# Patient Record
Sex: Female | Born: 1960 | Race: Black or African American | Hispanic: No
Health system: Southern US, Community
[De-identification: ages and names within clinical notes are randomized; demographics above are authoritative.]

## PROBLEM LIST (undated history)

## (undated) DIAGNOSIS — I422 Other hypertrophic cardiomyopathy: Secondary | ICD-10-CM

## (undated) DIAGNOSIS — J449 Chronic obstructive pulmonary disease, unspecified: Secondary | ICD-10-CM

## (undated) DIAGNOSIS — I5032 Chronic diastolic (congestive) heart failure: Secondary | ICD-10-CM

## (undated) DIAGNOSIS — E119 Type 2 diabetes mellitus without complications: Secondary | ICD-10-CM

## (undated) DIAGNOSIS — N186 End stage renal disease: Secondary | ICD-10-CM

## (undated) DIAGNOSIS — G4733 Obstructive sleep apnea (adult) (pediatric): Secondary | ICD-10-CM

## (undated) DIAGNOSIS — Z992 Dependence on renal dialysis: Secondary | ICD-10-CM

## (undated) DIAGNOSIS — J9621 Acute and chronic respiratory failure with hypoxia: Secondary | ICD-10-CM

## (undated) DIAGNOSIS — J45909 Unspecified asthma, uncomplicated: Secondary | ICD-10-CM

## (undated) DIAGNOSIS — I10 Essential (primary) hypertension: Secondary | ICD-10-CM

## (undated) DIAGNOSIS — E11319 Type 2 diabetes mellitus with unspecified diabetic retinopathy without macular edema: Secondary | ICD-10-CM

## (undated) DIAGNOSIS — J4489 Other specified chronic obstructive pulmonary disease: Secondary | ICD-10-CM

## (undated) DIAGNOSIS — I509 Heart failure, unspecified: Secondary | ICD-10-CM

## (undated) DIAGNOSIS — Z95810 Presence of automatic (implantable) cardiac defibrillator: Secondary | ICD-10-CM

## (undated) HISTORY — PX: CHOLECYSTECTOMY: SHX55

## (undated) HISTORY — DX: Dependence on renal dialysis: Z99.2

## (undated) HISTORY — DX: Dependence on renal dialysis: N18.6

## (undated) HISTORY — DX: Chronic obstructive pulmonary disease, unspecified: J44.9

## (undated) HISTORY — DX: Acute and chronic respiratory failure with hypoxia: J96.21

## (undated) HISTORY — DX: Morbid (severe) obesity due to excess calories: E66.01

## (undated) HISTORY — DX: Obstructive sleep apnea (adult) (pediatric): G47.33

## (undated) HISTORY — DX: Other specified chronic obstructive pulmonary disease: J44.89

## (undated) HISTORY — DX: Chronic diastolic (congestive) heart failure: I50.32

## (undated) HISTORY — PX: CARDIAC DEFIBRILLATOR PLACEMENT: SHX171

---

## 2008-10-31 DIAGNOSIS — E119 Type 2 diabetes mellitus without complications: Secondary | ICD-10-CM | POA: Insufficient documentation

## 2011-01-20 DIAGNOSIS — I10 Essential (primary) hypertension: Secondary | ICD-10-CM | POA: Insufficient documentation

## 2012-11-29 DIAGNOSIS — G4700 Insomnia, unspecified: Secondary | ICD-10-CM | POA: Insufficient documentation

## 2014-01-30 DIAGNOSIS — F329 Major depressive disorder, single episode, unspecified: Secondary | ICD-10-CM | POA: Insufficient documentation

## 2014-01-30 DIAGNOSIS — F32A Depression, unspecified: Secondary | ICD-10-CM | POA: Insufficient documentation

## 2014-01-30 DIAGNOSIS — Z95810 Presence of automatic (implantable) cardiac defibrillator: Secondary | ICD-10-CM | POA: Insufficient documentation

## 2016-04-20 DIAGNOSIS — E8770 Fluid overload, unspecified: Secondary | ICD-10-CM | POA: Insufficient documentation

## 2016-08-24 DIAGNOSIS — Z0000 Encounter for general adult medical examination without abnormal findings: Secondary | ICD-10-CM | POA: Insufficient documentation

## 2016-09-04 ENCOUNTER — Encounter: Payer: Self-pay | Admitting: Emergency Medicine

## 2016-09-04 ENCOUNTER — Inpatient Hospital Stay
Admission: EM | Admit: 2016-09-04 | Discharge: 2016-09-11 | DRG: 202 | Disposition: A | Discharge status: Home / Self Care | Payer: BC Managed Care – PPO | Attending: Internal Medicine | Admitting: Internal Medicine

## 2016-09-04 ENCOUNTER — Emergency Department: Payer: BC Managed Care – PPO

## 2016-09-04 DIAGNOSIS — J45901 Unspecified asthma with (acute) exacerbation: Secondary | ICD-10-CM | POA: Diagnosis present

## 2016-09-04 DIAGNOSIS — I422 Other hypertrophic cardiomyopathy: Secondary | ICD-10-CM

## 2016-09-04 DIAGNOSIS — I248 Other forms of acute ischemic heart disease: Secondary | ICD-10-CM | POA: Diagnosis present

## 2016-09-04 DIAGNOSIS — I2489 Other forms of acute ischemic heart disease: Secondary | ICD-10-CM

## 2016-09-04 DIAGNOSIS — I214 Non-ST elevation (NSTEMI) myocardial infarction: Secondary | ICD-10-CM | POA: Diagnosis not present

## 2016-09-04 DIAGNOSIS — J40 Bronchitis, not specified as acute or chronic: Secondary | ICD-10-CM

## 2016-09-04 DIAGNOSIS — Z6839 Body mass index (BMI) 39.0-39.9, adult: Secondary | ICD-10-CM

## 2016-09-04 DIAGNOSIS — J209 Acute bronchitis, unspecified: Principal | ICD-10-CM | POA: Diagnosis present

## 2016-09-04 DIAGNOSIS — Z79899 Other long term (current) drug therapy: Secondary | ICD-10-CM

## 2016-09-04 DIAGNOSIS — R059 Cough, unspecified: Secondary | ICD-10-CM

## 2016-09-04 DIAGNOSIS — I509 Heart failure, unspecified: Secondary | ICD-10-CM | POA: Diagnosis present

## 2016-09-04 DIAGNOSIS — Z7951 Long term (current) use of inhaled steroids: Secondary | ICD-10-CM

## 2016-09-04 DIAGNOSIS — J45909 Unspecified asthma, uncomplicated: Secondary | ICD-10-CM | POA: Diagnosis present

## 2016-09-04 DIAGNOSIS — E669 Obesity, unspecified: Secondary | ICD-10-CM | POA: Diagnosis present

## 2016-09-04 DIAGNOSIS — R05 Cough: Secondary | ICD-10-CM

## 2016-09-04 DIAGNOSIS — E119 Type 2 diabetes mellitus without complications: Secondary | ICD-10-CM | POA: Diagnosis present

## 2016-09-04 DIAGNOSIS — Z95810 Presence of automatic (implantable) cardiac defibrillator: Secondary | ICD-10-CM

## 2016-09-04 DIAGNOSIS — R0600 Dyspnea, unspecified: Secondary | ICD-10-CM

## 2016-09-04 DIAGNOSIS — I421 Obstructive hypertrophic cardiomyopathy: Secondary | ICD-10-CM

## 2016-09-04 DIAGNOSIS — Z23 Encounter for immunization: Secondary | ICD-10-CM

## 2016-09-04 DIAGNOSIS — F329 Major depressive disorder, single episode, unspecified: Secondary | ICD-10-CM | POA: Diagnosis present

## 2016-09-04 DIAGNOSIS — G4733 Obstructive sleep apnea (adult) (pediatric): Secondary | ICD-10-CM | POA: Diagnosis present

## 2016-09-04 DIAGNOSIS — Z7982 Long term (current) use of aspirin: Secondary | ICD-10-CM

## 2016-09-04 DIAGNOSIS — Z794 Long term (current) use of insulin: Secondary | ICD-10-CM

## 2016-09-04 DIAGNOSIS — I110 Hypertensive heart disease with heart failure: Secondary | ICD-10-CM | POA: Diagnosis present

## 2016-09-04 HISTORY — DX: Heart failure, unspecified: I50.9

## 2016-09-04 HISTORY — DX: Essential (primary) hypertension: I10

## 2016-09-04 HISTORY — DX: Type 2 diabetes mellitus without complications: E11.9

## 2016-09-04 LAB — CBC WITH DIFFERENTIAL/PLATELET
Basophils Absolute: 0 10*3/uL (ref 0–0.1)
Basophils Relative: 1 %
EOS PCT: 3 %
Eosinophils Absolute: 0.1 10*3/uL (ref 0–0.7)
HEMATOCRIT: 37 % (ref 35.0–47.0)
Hemoglobin: 12.3 g/dL (ref 12.0–16.0)
LYMPHS ABS: 0.9 10*3/uL — AB (ref 1.0–3.6)
LYMPHS PCT: 16 %
MCH: 26.5 pg (ref 26.0–34.0)
MCHC: 33.2 g/dL (ref 32.0–36.0)
MCV: 79.7 fL — AB (ref 80.0–100.0)
MONO ABS: 1.1 10*3/uL — AB (ref 0.2–0.9)
Monocytes Relative: 21 %
NEUTROS ABS: 3.3 10*3/uL (ref 1.4–6.5)
Neutrophils Relative %: 61 %
PLATELETS: 216 10*3/uL (ref 150–440)
RBC: 4.64 MIL/uL (ref 3.80–5.20)
RDW: 17.2 % — AB (ref 11.5–14.5)
WBC: 5.5 10*3/uL (ref 3.6–11.0)

## 2016-09-04 LAB — COMPREHENSIVE METABOLIC PANEL
ALT: 18 U/L (ref 14–54)
AST: 23 U/L (ref 15–41)
Albumin: 3.8 g/dL (ref 3.5–5.0)
Alkaline Phosphatase: 70 U/L (ref 38–126)
Anion gap: 11 (ref 5–15)
BILIRUBIN TOTAL: 0.4 mg/dL (ref 0.3–1.2)
BUN: 16 mg/dL (ref 6–20)
CHLORIDE: 106 mmol/L (ref 101–111)
CO2: 23 mmol/L (ref 22–32)
Calcium: 9.1 mg/dL (ref 8.9–10.3)
Creatinine, Ser: 0.94 mg/dL (ref 0.44–1.00)
Glucose, Bld: 170 mg/dL — ABNORMAL HIGH (ref 65–99)
Potassium: 4.3 mmol/L (ref 3.5–5.1)
Sodium: 140 mmol/L (ref 135–145)
TOTAL PROTEIN: 7.5 g/dL (ref 6.5–8.1)

## 2016-09-04 LAB — INFLUENZA PANEL BY PCR (TYPE A & B)
Influenza A By PCR: NEGATIVE
Influenza B By PCR: NEGATIVE

## 2016-09-04 LAB — APTT: APTT: 31 s (ref 24–36)

## 2016-09-04 LAB — PROTIME-INR
INR: 1
PROTHROMBIN TIME: 13.2 s (ref 11.4–15.2)

## 2016-09-04 LAB — TROPONIN I: Troponin I: 0.21 ng/mL (ref <0.03)

## 2016-09-04 MED ORDER — ASPIRIN 81 MG PO CHEW
324.0000 mg | CHEWABLE_TABLET | Freq: Once | ORAL | Status: AC
  Administered 2016-09-04: 324 mg via ORAL
  Filled 2016-09-04: qty 4

## 2016-09-04 MED ORDER — DM-GUAIFENESIN ER 30-600 MG PO TB12: 1.0000 | ORAL_TABLET | Freq: Two times a day (BID) | ORAL | Status: DC

## 2016-09-04 MED ORDER — IPRATROPIUM-ALBUTEROL 0.5-2.5 (3) MG/3ML IN SOLN
RESPIRATORY_TRACT | Status: AC
  Administered 2016-09-04: 3 mL via RESPIRATORY_TRACT
  Filled 2016-09-04: qty 3

## 2016-09-04 MED ORDER — GUAIFENESIN ER 600 MG PO TB12
600.0000 mg | ORAL_TABLET | Freq: Two times a day (BID) | ORAL | Status: DC
  Administered 2016-09-04: 600 mg via ORAL
  Filled 2016-09-04 (×2): qty 1

## 2016-09-04 MED ORDER — IPRATROPIUM-ALBUTEROL 0.5-2.5 (3) MG/3ML IN SOLN
3.0000 mL | Freq: Four times a day (QID) | RESPIRATORY_TRACT | Status: DC | PRN
  Administered 2016-09-04: 3 mL via RESPIRATORY_TRACT

## 2016-09-04 MED ORDER — DEXTROMETHORPHAN POLISTIREX ER 30 MG/5ML PO SUER
30.0000 mg | Freq: Two times a day (BID) | ORAL | Status: DC
  Administered 2016-09-04: 30 mg via ORAL
  Filled 2016-09-04 (×2): qty 5

## 2016-09-04 NOTE — ED Provider Notes (Signed)
[facility] Emergency Department Provider Note   ____________________________________________    I have reviewed the triage vital signs and the nursing notes.   HISTORY  Chief Complaint Cough and Shortness of Breath     HPI Julie Bender is a 56 y.o. female who presents with complaints of cough, shortness of breath and mild chest pressure. She reports this is been occurring for the last 2 days. Patient reports she was recently discharged from [facility], she was admitted there for fluid overload. Patient has a history of HOCM and diabetes, her cardiologist is at [facility]. She denies calf pain or swelling.   Past Medical History:  Diagnosis Date  . CHF (congestive heart failure) ([place])   . Diabetes mellitus without complication ([place])   . Hypertension     There are no active problems to display for this patient.   No past surgical history on file.  Prior to Admission medications   Not on File     Allergies Patient has no known allergies.  No family history on file.  Social History Social History  Substance Use Topics  . Smoking status: Passive Smoke Exposure - Never Smoker  . Smokeless tobacco: Never Used  . Alcohol use No    Review of Systems  Constitutional: No fever/chills  Cardiovascular: Chest pressure as above Respiratory: As above Gastrointestinal: No abdominal pain.   Musculoskeletal: Negative for back pain. Skin: Negative for rash. Neurological: Negative for headaches   10-point ROS otherwise negative.  ____________________________________________   PHYSICAL EXAM:  VITAL SIGNS: ED Triage Vitals  Enc Vitals Group     BP 09/04/16 2016 125/68     Pulse Rate 09/04/16 2016 63     Resp 09/04/16 2026 (!) 22     Temp 09/04/16 2016 98.5 F (36.9 C)     Temp Source 09/04/16 2016 Oral     SpO2 09/04/16 2016 96 %     Weight --      Height --      Head Circumference --      Peak Flow --      Pain Score 09/04/16 2019 9   Pain Loc --      Pain Edu? --      Excl. in [place]? --     Constitutional: Alert and oriented.  Pleasant and interactive Eyes: Conjunctivae are normal.   Nose: No congestion/rhinnorhea. Mouth/Throat: Mucous membranes are moist.   Neck:  Painless ROM Cardiovascular: Normal rate, regular rhythm. Grossly normal heart sounds.  Good peripheral circulation. Respiratory: Increased respiratory effort,  No retractions. Bibasilar rales Gastrointestinal: Soft and nontender. No distention.  No CVA tenderness. Genitourinary: deferred Musculoskeletal: No lower extremity tenderness nor edema.  Warm and well perfused Neurologic:  Normal speech and language. No gross focal neurologic deficits are appreciated.  Skin:  Skin is warm, dry and intact. No rash noted. Psychiatric: Mood and affect are normal. Speech and behavior are normal.  ____________________________________________   LABS (all labs ordered are listed, but only abnormal results are displayed)  Labs Reviewed  TROPONIN I - Abnormal; Notable for the following:       Result Value   Troponin I 0.21 (*)    All other components within normal limits  CBC WITH DIFFERENTIAL/PLATELET - Abnormal; Notable for the following:    MCV 79.7 (*)    RDW 17.2 (*)    Lymphs Abs 0.9 (*)    Monocytes Absolute 1.1 (*)    All other components within normal limits  COMPREHENSIVE METABOLIC PANEL - Abnormal; Notable for the following:    Glucose, Bld 170 (*)    All other components within normal limits  INFLUENZA PANEL BY PCR (TYPE A & B)  APTT  PROTIME-INR   ____________________________________________  EKG  ED ECG REPORT I, Lavonia Drafts, the attending physician, personally viewed and interpreted this ECG.  Date: 09/04/2016 EKG Time: 8:35 PM Rate: 63 Rhythm: normal sinus rhythm QRS Axis: normal Intervals: normal ST/T Wave abnormalities: Nonspecific Conduction Disturbances: Incomplete right  bundle   ____________________________________________  RADIOLOGY  Chest x-ray is remarkable ____________________________________________   PROCEDURES  Procedure(s) performed: No    Critical Care performed: yes  CRITICAL CARE Performed by: Lavonia Drafts   Total critical care time: 30 minutes  Critical care time was exclusive of separately billable procedures and treating other patients.  Critical care was necessary to treat or prevent imminent or life-threatening deterioration.  Critical care was time spent personally by me on the following activities: development of treatment plan with patient and/or surrogate as well as nursing, discussions with consultants, evaluation of patient's response to treatment, examination of patient, obtaining history from patient or surrogate, ordering and performing treatments and interventions, ordering and review of laboratory studies, ordering and review of radiographic studies, pulse oximetry and re-evaluation of patient's condition.  ____________________________________________   INITIAL IMPRESSION / ASSESSMENT AND PLAN / ED COURSE  Pertinent labs & imaging results that were available during my care of the patient were reviewed by me and considered in my medical decision making (see chart for details).  Patient presents with complaints of cough, short of breath and chest pressure. She feels she may have an upper respiratory infection. She has a history of HOCM with a defibrillator and diabetes. EKG with nonspecific changes.   Patient's troponin has come back elevated. Discussed with cardiology who agrees with heparin and admission for further evaluation.    ____________________________________________   FINAL CLINICAL IMPRESSION(S) / ED DIAGNOSES  Final diagnoses:  NSTEMI (non-ST elevated myocardial infarction) ([place])      NEW MEDICATIONS STARTED DURING THIS VISIT:  New Prescriptions   No medications on file     Note:   This document was prepared using Dragon voice recognition software and may include unintentional dictation errors.    Lavonia Drafts, MD 09/04/16 2252

## 2016-09-04 NOTE — ED Triage Notes (Signed)
Pt states she has been short of breath and has had a cough. Pt states she began to have chills with sore throat, cough on Thursday and has progressively gotten worse today. Pt states she also has diarrhea.

## 2016-09-04 NOTE — ED Notes (Signed)
Breath sounds clear in triage.

## 2016-09-05 DIAGNOSIS — I110 Hypertensive heart disease with heart failure: Secondary | ICD-10-CM | POA: Diagnosis present

## 2016-09-05 DIAGNOSIS — F329 Major depressive disorder, single episode, unspecified: Secondary | ICD-10-CM | POA: Diagnosis present

## 2016-09-05 DIAGNOSIS — I214 Non-ST elevation (NSTEMI) myocardial infarction: Secondary | ICD-10-CM | POA: Diagnosis present

## 2016-09-05 DIAGNOSIS — I509 Heart failure, unspecified: Secondary | ICD-10-CM | POA: Diagnosis present

## 2016-09-05 DIAGNOSIS — G4733 Obstructive sleep apnea (adult) (pediatric): Secondary | ICD-10-CM | POA: Diagnosis present

## 2016-09-05 DIAGNOSIS — J40 Bronchitis, not specified as acute or chronic: Secondary | ICD-10-CM | POA: Diagnosis not present

## 2016-09-05 DIAGNOSIS — I421 Obstructive hypertrophic cardiomyopathy: Secondary | ICD-10-CM | POA: Diagnosis present

## 2016-09-05 DIAGNOSIS — J45901 Unspecified asthma with (acute) exacerbation: Secondary | ICD-10-CM | POA: Diagnosis present

## 2016-09-05 DIAGNOSIS — R0602 Shortness of breath: Secondary | ICD-10-CM

## 2016-09-05 DIAGNOSIS — Z794 Long term (current) use of insulin: Secondary | ICD-10-CM | POA: Diagnosis not present

## 2016-09-05 DIAGNOSIS — Z7982 Long term (current) use of aspirin: Secondary | ICD-10-CM | POA: Diagnosis not present

## 2016-09-05 DIAGNOSIS — E669 Obesity, unspecified: Secondary | ICD-10-CM | POA: Diagnosis present

## 2016-09-05 DIAGNOSIS — Z6839 Body mass index (BMI) 39.0-39.9, adult: Secondary | ICD-10-CM | POA: Diagnosis not present

## 2016-09-05 DIAGNOSIS — J45909 Unspecified asthma, uncomplicated: Secondary | ICD-10-CM | POA: Diagnosis present

## 2016-09-05 DIAGNOSIS — Z23 Encounter for immunization: Secondary | ICD-10-CM | POA: Diagnosis not present

## 2016-09-05 DIAGNOSIS — J9801 Acute bronchospasm: Secondary | ICD-10-CM | POA: Diagnosis not present

## 2016-09-05 DIAGNOSIS — Z79899 Other long term (current) drug therapy: Secondary | ICD-10-CM | POA: Diagnosis not present

## 2016-09-05 DIAGNOSIS — Z95810 Presence of automatic (implantable) cardiac defibrillator: Secondary | ICD-10-CM | POA: Diagnosis not present

## 2016-09-05 DIAGNOSIS — I248 Other forms of acute ischemic heart disease: Secondary | ICD-10-CM | POA: Diagnosis present

## 2016-09-05 DIAGNOSIS — R079 Chest pain, unspecified: Secondary | ICD-10-CM | POA: Diagnosis not present

## 2016-09-05 DIAGNOSIS — J209 Acute bronchitis, unspecified: Secondary | ICD-10-CM | POA: Diagnosis present

## 2016-09-05 DIAGNOSIS — Z7951 Long term (current) use of inhaled steroids: Secondary | ICD-10-CM | POA: Diagnosis not present

## 2016-09-05 DIAGNOSIS — E119 Type 2 diabetes mellitus without complications: Secondary | ICD-10-CM | POA: Diagnosis present

## 2016-09-05 DIAGNOSIS — R05 Cough: Secondary | ICD-10-CM | POA: Diagnosis not present

## 2016-09-05 LAB — BRAIN NATRIURETIC PEPTIDE: B Natriuretic Peptide: 559 pg/mL — ABNORMAL HIGH (ref 0.0–100.0)

## 2016-09-05 LAB — PROTIME-INR
INR: 1.1
Prothrombin Time: 14.2 seconds (ref 11.4–15.2)

## 2016-09-05 LAB — LIPID PANEL
Cholesterol: 90 mg/dL (ref 0–200)
HDL: 30 mg/dL — ABNORMAL LOW (ref >40)
LDL Cholesterol: 48 mg/dL (ref 0–99)
Total CHOL/HDL Ratio: 3 RATIO
Triglycerides: 60 mg/dL (ref <150)
VLDL: 12 mg/dL (ref 0–40)

## 2016-09-05 LAB — HEPARIN LEVEL (UNFRACTIONATED): HEPARIN UNFRACTIONATED: 0.45 [IU]/mL (ref 0.30–0.70)

## 2016-09-05 LAB — BASIC METABOLIC PANEL
Anion gap: 9 (ref 5–15)
BUN: 15 mg/dL (ref 6–20)
CHLORIDE: 106 mmol/L (ref 101–111)
CO2: 25 mmol/L (ref 22–32)
CREATININE: 0.88 mg/dL (ref 0.44–1.00)
Calcium: 8.8 mg/dL — ABNORMAL LOW (ref 8.9–10.3)
GFR calc Af Amer: >60 mL/min (ref >60)
GFR calc non Af Amer: >60 mL/min (ref >60)
GLUCOSE: 122 mg/dL — AB (ref 65–99)
Potassium: 3.8 mmol/L (ref 3.5–5.1)
SODIUM: 140 mmol/L (ref 135–145)

## 2016-09-05 LAB — CBC
HCT: 35.9 % (ref 35.0–47.0)
Hemoglobin: 12 g/dL (ref 12.0–16.0)
MCH: 26.7 pg (ref 26.0–34.0)
MCHC: 33.5 g/dL (ref 32.0–36.0)
MCV: 79.6 fL — AB (ref 80.0–100.0)
PLATELETS: 229 10*3/uL (ref 150–440)
RBC: 4.51 MIL/uL (ref 3.80–5.20)
RDW: 17.3 % — AB (ref 11.5–14.5)
WBC: 5.9 10*3/uL (ref 3.6–11.0)

## 2016-09-05 LAB — TROPONIN I
TROPONIN I: 0.21 ng/mL — AB (ref <0.03)
TROPONIN I: 0.2 ng/mL — AB (ref <0.03)
Troponin I: 0.2 ng/mL (ref <0.03)

## 2016-09-05 LAB — GLUCOSE, CAPILLARY
GLUCOSE-CAPILLARY: 186 mg/dL — AB (ref 65–99)
Glucose-Capillary: 320 mg/dL — ABNORMAL HIGH (ref 65–99)
Glucose-Capillary: 461 mg/dL — ABNORMAL HIGH (ref 65–99)
Glucose-Capillary: 498 mg/dL — ABNORMAL HIGH (ref 65–99)

## 2016-09-05 LAB — TSH: TSH: 1.152 u[IU]/mL (ref 0.350–4.500)

## 2016-09-05 MED ORDER — INSULIN ASPART 100 UNIT/ML ~~LOC~~ SOLN
22.0000 [IU] | Freq: Once | SUBCUTANEOUS | Status: AC
  Administered 2016-09-05: 22 [IU] via SUBCUTANEOUS
  Filled 2016-09-05: qty 22

## 2016-09-05 MED ORDER — ALPRAZOLAM 0.25 MG PO TABS: 0.2500 mg | ORAL_TABLET | Freq: Two times a day (BID) | ORAL | Status: DC | PRN

## 2016-09-05 MED ORDER — INSULIN ASPART 100 UNIT/ML ~~LOC~~ SOLN
0.0000 [IU] | Freq: Three times a day (TID) | SUBCUTANEOUS | Status: DC
  Administered 2016-09-05: 15 [IU] via SUBCUTANEOUS
  Administered 2016-09-05: 4 [IU] via SUBCUTANEOUS
  Administered 2016-09-06: 11 [IU] via SUBCUTANEOUS
  Administered 2016-09-06: 7 [IU] via SUBCUTANEOUS
  Administered 2016-09-06: 15 [IU] via SUBCUTANEOUS
  Administered 2016-09-07: 11 [IU] via SUBCUTANEOUS
  Administered 2016-09-07: 20 [IU] via SUBCUTANEOUS
  Administered 2016-09-07 – 2016-09-08 (×2): 15 [IU] via SUBCUTANEOUS
  Administered 2016-09-08: 20 [IU] via SUBCUTANEOUS
  Administered 2016-09-08: 11 [IU] via SUBCUTANEOUS
  Administered 2016-09-09: 20 [IU] via SUBCUTANEOUS
  Administered 2016-09-09: 4 [IU] via SUBCUTANEOUS
  Administered 2016-09-09: 20 [IU] via SUBCUTANEOUS
  Administered 2016-09-10: 11 [IU] via SUBCUTANEOUS
  Administered 2016-09-10: 7 [IU] via SUBCUTANEOUS
  Administered 2016-09-10: 11 [IU] via SUBCUTANEOUS
  Administered 2016-09-11: 3 [IU] via SUBCUTANEOUS
  Filled 2016-09-05: qty 7
  Filled 2016-09-05: qty 11
  Filled 2016-09-05 (×2): qty 15
  Filled 2016-09-05: qty 4
  Filled 2016-09-05: qty 15
  Filled 2016-09-05: qty 11
  Filled 2016-09-05: qty 3
  Filled 2016-09-05: qty 7
  Filled 2016-09-05: qty 15
  Filled 2016-09-05 (×2): qty 20
  Filled 2016-09-05: qty 4
  Filled 2016-09-05: qty 20
  Filled 2016-09-05 (×2): qty 11
  Filled 2016-09-05: qty 20
  Filled 2016-09-05: qty 11

## 2016-09-05 MED ORDER — NITROGLYCERIN 0.4 MG SL SUBL: 0.4000 mg | SUBLINGUAL_TABLET | SUBLINGUAL | Status: DC | PRN

## 2016-09-05 MED ORDER — FUROSEMIDE 10 MG/ML IJ SOLN
40.0000 mg | Freq: Once | INTRAMUSCULAR | Status: AC
Start: 2016-09-05 — End: 2016-09-05
  Administered 2016-09-05: 40 mg via INTRAVENOUS
  Filled 2016-09-05: qty 4

## 2016-09-05 MED ORDER — DEXTROSE 5 % IV SOLN: 250.0000 mg | INTRAVENOUS | Status: DC

## 2016-09-05 MED ORDER — BUMETANIDE 1 MG PO TABS
1.0000 mg | ORAL_TABLET | Freq: Two times a day (BID) | ORAL | Status: DC
  Administered 2016-09-05 – 2016-09-11 (×13): 1 mg via ORAL
  Filled 2016-09-05 (×14): qty 1

## 2016-09-05 MED ORDER — IPRATROPIUM-ALBUTEROL 0.5-2.5 (3) MG/3ML IN SOLN
3.0000 mL | Freq: Four times a day (QID) | RESPIRATORY_TRACT | Status: DC | PRN
  Administered 2016-09-05: 3 mL via RESPIRATORY_TRACT
  Filled 2016-09-05: qty 3

## 2016-09-05 MED ORDER — ZOLPIDEM TARTRATE 5 MG PO TABS: 5.0000 mg | ORAL_TABLET | Freq: Every evening | ORAL | Status: DC | PRN

## 2016-09-05 MED ORDER — LOSARTAN POTASSIUM 50 MG PO TABS
100.0000 mg | ORAL_TABLET | Freq: Every day | ORAL | Status: DC
  Administered 2016-09-05 – 2016-09-11 (×7): 100 mg via ORAL
  Filled 2016-09-05 (×7): qty 2

## 2016-09-05 MED ORDER — INSULIN GLARGINE 100 UNIT/ML ~~LOC~~ SOLN
40.0000 [IU] | Freq: Every day | SUBCUTANEOUS | Status: DC
  Administered 2016-09-05 – 2016-09-07 (×3): 40 [IU] via SUBCUTANEOUS
  Filled 2016-09-05 (×4): qty 0.4

## 2016-09-05 MED ORDER — ATORVASTATIN CALCIUM 20 MG PO TABS
20.0000 mg | ORAL_TABLET | Freq: Every day | ORAL | Status: DC
  Administered 2016-09-05 – 2016-09-11 (×7): 20 mg via ORAL
  Filled 2016-09-05 (×7): qty 1

## 2016-09-05 MED ORDER — SODIUM CHLORIDE 0.9% FLUSH: 3.0000 mL | INTRAVENOUS | Status: DC | PRN

## 2016-09-05 MED ORDER — BENZONATATE 100 MG PO CAPS
100.0000 mg | ORAL_CAPSULE | Freq: Three times a day (TID) | ORAL | Status: DC | PRN
  Administered 2016-09-06 – 2016-09-10 (×3): 100 mg via ORAL
  Filled 2016-09-05 (×3): qty 1

## 2016-09-05 MED ORDER — PNEUMOCOCCAL 13-VAL CONJ VACC IM SUSP
0.5000 mL | INTRAMUSCULAR | Status: DC
  Filled 2016-09-05: qty 0.5

## 2016-09-05 MED ORDER — HYDROCOD POLST-CPM POLST ER 10-8 MG/5ML PO SUER
5.0000 mL | Freq: Two times a day (BID) | ORAL | Status: DC
  Administered 2016-09-05 – 2016-09-11 (×12): 5 mL via ORAL
  Filled 2016-09-05 (×13): qty 5

## 2016-09-05 MED ORDER — BENZONATATE 100 MG PO CAPS
100.0000 mg | ORAL_CAPSULE | Freq: Three times a day (TID) | ORAL | Status: DC | PRN
  Administered 2016-09-05 (×2): 100 mg via ORAL
  Filled 2016-09-05: qty 1

## 2016-09-05 MED ORDER — BENZONATATE 100 MG PO CAPS
ORAL_CAPSULE | ORAL | Status: AC
  Administered 2016-09-05: 100 mg via ORAL
  Filled 2016-09-05: qty 1

## 2016-09-05 MED ORDER — METHYLPREDNISOLONE SODIUM SUCC 125 MG IJ SOLR
60.0000 mg | Freq: Four times a day (QID) | INTRAMUSCULAR | Status: DC
Start: 2016-09-05 — End: 2016-09-07
  Administered 2016-09-05 (×3): 60 mg via INTRAVENOUS
  Administered 2016-09-05: 125 mg via INTRAVENOUS
  Administered 2016-09-06 – 2016-09-07 (×6): 60 mg via INTRAVENOUS
  Filled 2016-09-05 (×11): qty 2

## 2016-09-05 MED ORDER — HEPARIN BOLUS VIA INFUSION
4000.0000 [IU] | Freq: Once | INTRAVENOUS | Status: AC
  Administered 2016-09-05: 4000 [IU] via INTRAVENOUS
  Filled 2016-09-05: qty 4000

## 2016-09-05 MED ORDER — SODIUM CHLORIDE 0.9 % IV SOLN: 250.0000 mL | INTRAVENOUS | Status: DC | PRN

## 2016-09-05 MED ORDER — TRAZODONE HCL 50 MG PO TABS
150.0000 mg | ORAL_TABLET | Freq: Every day | ORAL | Status: DC
  Administered 2016-09-05 – 2016-09-10 (×6): 150 mg via ORAL
  Filled 2016-09-05 (×6): qty 1

## 2016-09-05 MED ORDER — INSULIN ASPART 100 UNIT/ML ~~LOC~~ SOLN
6.0000 [IU] | Freq: Once | SUBCUTANEOUS | Status: AC
  Administered 2016-09-05: 6 [IU] via SUBCUTANEOUS
  Filled 2016-09-05: qty 6

## 2016-09-05 MED ORDER — HEPARIN (PORCINE) IN NACL 100-0.45 UNIT/ML-% IJ SOLN
950.0000 [IU]/h | INTRAMUSCULAR | Status: DC
  Administered 2016-09-05: 950 [IU]/h via INTRAVENOUS
  Filled 2016-09-05: qty 250

## 2016-09-05 MED ORDER — ACETAMINOPHEN 325 MG PO TABS
650.0000 mg | ORAL_TABLET | ORAL | Status: DC | PRN
  Administered 2016-09-07 – 2016-09-10 (×3): 650 mg via ORAL
  Filled 2016-09-05 (×3): qty 2

## 2016-09-05 MED ORDER — IPRATROPIUM-ALBUTEROL 0.5-2.5 (3) MG/3ML IN SOLN
3.0000 mL | RESPIRATORY_TRACT | Status: DC
  Administered 2016-09-05 – 2016-09-09 (×25): 3 mL via RESPIRATORY_TRACT
  Filled 2016-09-05 (×25): qty 3

## 2016-09-05 MED ORDER — VERAPAMIL HCL ER 240 MG PO TBCR
240.0000 mg | EXTENDED_RELEASE_TABLET | Freq: Two times a day (BID) | ORAL | Status: DC
  Administered 2016-09-05 – 2016-09-11 (×13): 240 mg via ORAL
  Filled 2016-09-05 (×13): qty 1

## 2016-09-05 MED ORDER — INSULIN ASPART 100 UNIT/ML ~~LOC~~ SOLN
0.0000 [IU] | Freq: Every day | SUBCUTANEOUS | Status: DC
  Administered 2016-09-06: 4 [IU] via SUBCUTANEOUS
  Administered 2016-09-07 – 2016-09-09 (×3): 5 [IU] via SUBCUTANEOUS
  Filled 2016-09-05: qty 5
  Filled 2016-09-05: qty 4
  Filled 2016-09-05: qty 3
  Filled 2016-09-05: qty 5

## 2016-09-05 MED ORDER — ASPIRIN EC 81 MG PO TBEC
81.0000 mg | DELAYED_RELEASE_TABLET | Freq: Every day | ORAL | Status: DC
Start: 2016-09-05 — End: 2016-09-11
  Administered 2016-09-05 – 2016-09-11 (×7): 81 mg via ORAL
  Filled 2016-09-05 (×7): qty 1

## 2016-09-05 MED ORDER — GUAIFENESIN-CODEINE 100-10 MG/5ML PO SOLN
5.0000 mL | Freq: Four times a day (QID) | ORAL | Status: DC | PRN
  Administered 2016-09-05 – 2016-09-10 (×6): 5 mL via ORAL
  Filled 2016-09-05 (×6): qty 5

## 2016-09-05 MED ORDER — ONDANSETRON HCL 4 MG/2ML IJ SOLN: 4.0000 mg | Freq: Four times a day (QID) | INTRAMUSCULAR | Status: DC | PRN

## 2016-09-05 MED ORDER — DEXTROSE 5 % IV SOLN
250.0000 mg | Freq: Two times a day (BID) | INTRAVENOUS | Status: DC
  Administered 2016-09-05 – 2016-09-06 (×2): 250 mg via INTRAVENOUS
  Filled 2016-09-05 (×3): qty 0.25

## 2016-09-05 MED ORDER — CITALOPRAM HYDROBROMIDE 20 MG PO TABS
20.0000 mg | ORAL_TABLET | Freq: Every day | ORAL | Status: DC
  Administered 2016-09-05 – 2016-09-11 (×7): 20 mg via ORAL
  Filled 2016-09-05 (×7): qty 1

## 2016-09-05 MED ORDER — CARVEDILOL 25 MG PO TABS
25.0000 mg | ORAL_TABLET | Freq: Two times a day (BID) | ORAL | Status: DC
  Administered 2016-09-05 – 2016-09-09 (×10): 25 mg via ORAL
  Filled 2016-09-05 (×10): qty 1

## 2016-09-05 MED ORDER — SODIUM CHLORIDE 0.9% FLUSH
3.0000 mL | Freq: Two times a day (BID) | INTRAVENOUS | Status: DC
  Administered 2016-09-05 – 2016-09-10 (×13): 3 mL via INTRAVENOUS

## 2016-09-05 MED ORDER — BUDESONIDE 0.25 MG/2ML IN SUSP
0.2500 mg | Freq: Two times a day (BID) | RESPIRATORY_TRACT | Status: DC
  Administered 2016-09-05 – 2016-09-07 (×5): 0.25 mg via RESPIRATORY_TRACT
  Filled 2016-09-05 (×5): qty 2

## 2016-09-05 MED ORDER — OXYCODONE-ACETAMINOPHEN 5-325 MG PO TABS
1.0000 | ORAL_TABLET | Freq: Four times a day (QID) | ORAL | Status: DC | PRN
  Administered 2016-09-05 – 2016-09-11 (×17): 1 via ORAL
  Filled 2016-09-05 (×17): qty 1

## 2016-09-05 NOTE — H&P (Addendum)
History and Physical   [facility] - [place] @ [facility] Admission History and Physical [employer], D.O.    Patient Name: Julie Bender MR#: [id number] Date of Birth: [birth year] Date of Admission: 09/04/2016  Referring MD/NP/PA: Dr. Corky Downs Primary Care Physician: [place] [facility] Special[facility]: [facility]  Patient coming from: Home  Chief Complaint: Cough  HPI: Julie Bender is a 56 y.o. female with a known history of CHF, DM, HTN, hypertrophic obstructive cardiomyopathy  With defibrillator, depressionwas in a usual state of health until about  2 days ago when she developed  Nonproductive cough, generalized malaise, fatigue, achiness and associated central chest pressure accompanying her cough. Patient states that she was recently admitted to [facility] for congestive heart failure exacerbation. She has attempted to treat her cough and congestion with over-the-counter expectorants which did not provide much relief. She states that her chest pain is central, localized, associated with cough. She does experience shortness of breath with coughing fits but denies diaphoresis or nausea. She denies fevers, chills, dizziness, nausea, vomiting, abdominal pain  Otherwise there has been no change in status. Patient has been taking medication as prescribed and there has been no recent change in medication or diet.  No recent antibiotics.  There has been no recent illness, travel or sick contacts.    ED Course:  In the emergency department patient was found to have elevated troponin 0.21 was started on heparin drip after ED physician consulted with cardiology on-call.  Review of Systems:  CONSTITUTIONAL: No fever/chills,  Positive generalizedfatigue, weakness,  negativeweight gain/loss, headache. EYES: No blurry or double vision. ENT: No tinnitus, postnasal drip, redness or soreness of the oropharynx. RESPIRATORY:  Positive nonproductivecough, dyspnea, wheeze.  No  hemoptysis.  CARDIOVASCULAR:  Positivechest pain,  negativepalpitations, syncope, orthopnea. No lower extremity edema.  GASTROINTESTINAL: No nausea, vomiting, abdominal pain, diarrhea, constipation.  No hematemesis, melena or hematochezia. GENITOURINARY: No dysuria, frequency, hematuria. ENDOCRINE: No polyuria or nocturia. No heat or cold intolerance. HEMATOLOGY: No anemia, bruising, bleeding. INTEGUMENTARY: No rashes, ulcers, lesions. MUSCULOSKELETAL: No arthritis, gout, dyspnea. NEUROLOGIC: No numbness, tingling, ataxia, seizure-type activity, weakness. PSYCHIATRIC: No anxiety, depression, insomnia.   Past Medical History:  Diagnosis Date  . CHF (congestive heart failure) ([place])   . Diabetes mellitus without complication ([place])   . Hypertension      cholecystectomy Catheterization   reports that she is a non-smoker but has been exposed to tobacco smoke. She has never used smokeless tobacco. She reports that she does not drink alcohol. Her drug history is not on file.  No Known Allergies   father with no known medical problems, sister and one son with hypertrophic cardiomyopathy, second son with hypertension Family history has been reviewed and confirmed with patient.   Prior to Admission medications   Medication Sig Start Date End Date Taking? Authorizing Provider  aspirin EC 81 MG tablet Take 81 mg by mouth daily. 07/21/16 07/21/17 Yes Historical Provider, MD  atorvastatin (LIPITOR) 20 MG tablet Take 20 mg by mouth daily. 05/03/16 04/28/17 Yes Historical Provider, MD  bumetanide (BUMEX) 1 MG tablet Take 1 mg by mouth 2 (two) times daily. 08/31/16 09/30/16 Yes Historical Provider, MD  carvedilol (COREG) 25 MG tablet Take 25 mg by mouth 2 (two) times daily. 08/31/16 09/30/16 Yes Historical Provider, MD  citalopram (CELEXA) 20 MG tablet Take 20 mg by mouth daily. 05/03/16 04/28/17 Yes Historical Provider, MD  insulin aspart (NOVOLOG FLEXPEN) 100 UNIT/ML FlexPen Inject 12 Units into the skin 3  (three)  times daily. 08/17/16  Yes Historical Provider, MD  insulin glargine (LANTUS) 100 UNIT/ML injection Inject 40 Units into the skin at bedtime. 04/02/16  Yes Historical Provider, MD  losartan (COZAAR) 100 MG tablet Take 100 mg by mouth daily. 09/01/16 10/01/16 Yes Historical Provider, MD  metFORMIN (GLUCOPHAGE) 850 MG tablet Take 850 mg by mouth 3 (three) times daily. 05/03/16 05/03/17 Yes Historical Provider, MD  traZODone (DESYREL) 150 MG tablet Take 150 mg by mouth at bedtime. 04/02/16  Yes Historical Provider, MD  verapamil (CALAN-SR) 240 MG CR tablet Take 240 mg by mouth 2 (two) times daily. 05/03/16 05/03/17 Yes Historical Provider, MD    Physical Exam: Vitals:   09/04/16 2239 09/04/16 2300 09/04/16 2330 09/05/16 0000  BP: (!) 145/68 (!) 149/91 (!) 156/81 (!) 158/82  Pulse: 63 62 74 (!) 56  Resp: (!) 24 19 (!) 22 15  Temp:      TempSrc:      SpO2: 95% 94% 100% 95%  Weight:      Height:        GENERAL: 56 y.o.-year-old black female patient, well-developed, well-nourished lying in the bed in no acute distress.  Pleasant and cooperative.   HEENT: Head atraumatic, normocephalic. Pupils equal, round, reactive to light and accommodation. No scleral icterus. Extraocular muscles intact. Nares are patent. Oropharynx is clear. Mucus membranes moist. NECK: Supple, full range of motion. No JVD, no bruit heard. No thyroid enlargement, no tenderness, no cervical lymphadenopathy. CHEST:  Decreased breath sounds at bilateral bases with mild expiratory wheezing. No use of accessory muscles of respiration.  There is reproducible chest wall tenderness.  CARDIOVASCULAR: S1, S2 normal. No murmurs, rubs, or gallops. Cap refill <2 seconds. Pulses intact distally.  ABDOMEN: Soft, nondistended, nontender. No rebound, guarding, rigidity. Normoactive bowel sounds present in all four quadrants. No organomegaly or mass. EXTREMITIES: No pedal edema, cyanosis, or clubbing. No calf tenderness or Homan's sign.   NEUROLOGIC: The patient is alert and oriented x 3. Cranial nerves II through XII are grossly intact with no focal sensorimotor deficit. Muscle strength 5/5 in all extremities. Sensation intact. Gait not checked. PSYCHIATRIC:  Normal affect, mood, thought content. SKIN: Warm, dry, and intact without obvious rash, lesion, or ulcer.   [facility] hypertrophic cardiomyopathy, asymmetric septal hypertrophy, normal LV systolic function with ejection fraction greater than 55%, degenerative mitral valve disease, moderately dilated left atrium, moderately dilated right atrium   Labs on Admission:  CBC:  Recent Labs Lab 09/04/16 2019  WBC 5.5  NEUTROABS 3.3  HGB 12.3  HCT 37.0  MCV 79.7*  PLT 283   Basic Metabolic Panel:  Recent Labs Lab 09/04/16 2029  NA 140  K 4.3  CL 106  CO2 23  GLUCOSE 170*  BUN 16  CREATININE 0.94  CALCIUM 9.1   GFR: Estimated Creatinine Clearance: 77.1 mL/min (by C-G formula based on SCr of 0.94 mg/dL). Liver Function Tests:  Recent Labs Lab 09/04/16 2029  AST 23  ALT 18  ALKPHOS 70  BILITOT 0.4  PROT 7.5  ALBUMIN 3.8   No results for input(s): LIPASE, AMYLASE in the last 168 hours. No results for input(s): AMMONIA in the last 168 hours. Coagulation Profile:  Recent Labs Lab 09/04/16 2029  INR 1.00   Cardiac Enzymes:  Recent Labs Lab 09/04/16 2029  TROPONINI 0.21*   BNP (last 3 results) No results for input(s): PROBNP in the last 8760 hours. HbA1C: No results for input(s): HGBA1C in the last 72 hours.  CBG: No results for input(s): GLUCAP in the last 168 hours. Lipid Profile: No results for input(s): CHOL, HDL, LDLCALC, TRIG, CHOLHDL, LDLDIRECT in the last 72 hours. Thyroid Function Tests: No results for input(s): TSH, T4TOTAL, FREET4, T3FREE, THYROIDAB in the last 72 hours. Anemia Panel: No results for input(s): VITAMINB12, FOLATE, FERRITIN, TIBC, IRON, RETICCTPCT in the last 72  hours. Urine analysis: No results found for: COLORURINE, APPEARANCEUR, LABSPEC, PHURINE, GLUCOSEU, HGBUR, BILIRUBINUR, KETONESUR, PROTEINUR, UROBILINOGEN, NITRITE, LEUKOCYTESUR Sepsis Labs: @LABRCNTIP (procalcitonin:4,lacticidven:4) )No results found for this or any previous visit (from the past 240 hour(s)).   Radiological Exams on Admission: Dg Chest 2 View  Result Date: 09/04/2016 CLINICAL DATA:  Shortness breath and cough for 3 days.  Diarrhea. EXAM: CHEST  2 VIEW COMPARISON:  None. FINDINGS: Borderline cardiomegaly noted. AICD is seen in appropriate position. Both lungs are clear. No evidence of pleural effusion or pneumothorax. IMPRESSION: No active cardiopulmonary disease. Electronically Signed   By: Earle Gell M.D.   On: 09/04/2016 21:34    EKG: Normal sinus rhythm at  63 bpm with normal axis and nonspecific ST-T wave changes.   Assessment/Plan  This is a 56 y.o. female with a history of CHF, HOCM, HTN, DM now being admitted with: 1. Elevated troponin, rule out NSTEMI - chest pain with cough, EKG nonischemic, no prior troponin for baseline though kidney function wnl. Chest pain likely related to bronchitis given the history however with patient's recent hospitalization,risk factors including CHF, hypertrophic cardiomyopathy we will - Admit to inpatient with telemetry monitoring. - Continue heparin drip started in ED - Trend troponins, check lipids and TSH. - Morphine, nitro, beta blocker, aspirin and statin ordered.   - Cardiology consultation has been requested.  2. Bronchitis. No evidence to suggest pneumonia or influenza -will treat with nebulizers, steroids O2, expectorants 3. H/O HOCM -continue aspirin, Lipitor, Bumex 4. H/O HTN -continue Coreg, Cozaar, verapamil 5. H/O DM -continue Lantus at night and cover with regular insulin sliding scale at meals and bedtime. Hold metformin  6. Depression-continue   Admission status: Inpatient Celexa IV Fluids:  HL Diet/Nutrition: HH, CC Consults called: Cardio  DVT Px: Lovenox, SCDs and early ambulation. Code Status: Full Code  Disposition Plan: To home in 2-3 days   All the records are reviewed and case discussed with ED provider. Management plans discussed with the patient and/or family who express understanding and agree with plan of care.  Antha Niday D.O. on 09/05/2016 at 12:16 AM Between 7am to 6pm - Pager - [phone] After 6pm go to [email or web address] - [employer] [facility] [place] [facility] Office [phone] CC: Primary care physician; [facility]   09/05/2016, 12:16 AM

## 2016-09-05 NOTE — Progress Notes (Signed)
[place] at [place] NAME: Julie Bender    MR#:  [id number]  DATE OF BIRTH:  09/23/1960  SUBJECTIVE:  CHIEF COMPLAINT:   Chief Complaint  Patient presents with  . Cough  . Shortness of Breath   K with cough and shortness of breath. Noted to have slightly elevated troponin with some complaint of chest tightness so given his admission for non-ST elevation MI after starting on heparin IV drip. Her troponin remained stable so cardiologist DC'd heparin IV drip. Patient still her complain of cough with shortness of breath. REVIEW OF SYSTEMS:  CONSTITUTIONAL: No fever, fatigue or weakness.  EYES: No blurred or double vision.  EARS, NOSE, AND THROAT: No tinnitus or ear pain.  RESPIRATORY: Positive for cough, shortness of breath, some wheezing , no hemoptysis.  CARDIOVASCULAR: No chest pain, orthopnea, edema.  GASTROINTESTINAL: No nausea, vomiting, diarrhea or abdominal pain.  GENITOURINARY: No dysuria, hematuria.  ENDOCRINE: No polyuria, nocturia,  HEMATOLOGY: No anemia, easy bruising or bleeding SKIN: No rash or lesion. MUSCULOSKELETAL: No joint pain or arthritis.   NEUROLOGIC: No tingling, numbness, weakness.  PSYCHIATRY: No anxiety or depression.   ROS  DRUG ALLERGIES:  No Known Allergies  VITALS:  Blood pressure (!) 167/73, pulse 77, temperature 97.9 F (36.6 C), temperature source Oral, resp. rate 16, height 5\' 4"  (1.626 m), weight 101.6 kg (224 lb), SpO2 93 %.  PHYSICAL EXAMINATION:  GENERAL:  56 y.o.-year-old patient lying in the bed with no acute distress.  EYES: Pupils equal, round, reactive to light and accommodation. No scleral icterus. Extraocular muscles intact.  HEENT: Head atraumatic, normocephalic. Oropharynx and nasopharynx clear.  NECK:  Supple, no jugular venous distention. No thyroid enlargement, no tenderness.  LUNGS: Normal breath sounds bilaterally, Mild wheezing, no crepitation. No use of accessory muscles of  respiration.  CARDIOVASCULAR: S1, S2 normal. No murmurs, rubs, or gallops.  ABDOMEN: Soft, nontender, nondistended. Bowel sounds present. No organomegaly or mass.  EXTREMITIES: No pedal edema, cyanosis, or clubbing.  NEUROLOGIC: Cranial nerves II through XII are intact. Muscle strength 5/5 in all extremities. Sensation intact. Gait not checked.  PSYCHIATRIC: The patient is alert and oriented x 3.  SKIN: No obvious rash, lesion, or ulcer.   Physical Exam LABORATORY PANEL:   CBC  Recent Labs Lab 09/05/16 0314  WBC 5.9  HGB 12.0  HCT 35.9  PLT 229   ------------------------------------------------------------------------------------------------------------------  Chemistries   Recent Labs Lab 09/04/16 2029 09/05/16 0314  NA 140 140  K 4.3 3.8  CL 106 106  CO2 23 25  GLUCOSE 170* 122*  BUN 16 15  CREATININE 0.94 0.88  CALCIUM 9.1 8.8*  AST 23  --   ALT 18  --   ALKPHOS 70  --   BILITOT 0.4  --    ------------------------------------------------------------------------------------------------------------------  Cardiac Enzymes  Recent Labs Lab 09/05/16 0314 09/05/16 0850  TROPONINI 0.21* 0.20*   ------------------------------------------------------------------------------------------------------------------  RADIOLOGY:  Dg Chest 2 View  Result Date: 09/04/2016 CLINICAL DATA:  Shortness breath and cough for 3 days.  Diarrhea. EXAM: CHEST  2 VIEW COMPARISON:  None. FINDINGS: Borderline cardiomegaly noted. AICD is seen in appropriate position. Both lungs are clear. No evidence of pleural effusion or pneumothorax. IMPRESSION: No active cardiopulmonary disease. Electronically Signed   By: Earle Gell M.D.   On: 09/04/2016 21:34    ASSESSMENT AND PLAN:   Active Problems:   NSTEMI (non-ST elevated myocardial infarction) ([place])  * NSTEMI- was suspected on admission but ruled  out by 3 troponins negative. Remained stable on telemetry.   Appreciated cardiology  consult, suggested to stop heparin IV drip.  * Acute bronchitis   This is mainly the reason for her cough and chest tightness.   I will give oral azithromycin. And given some bronchodilators with cough suppressants.   Influenza test is negative, no pneumonia on chest x-ray.  * History of HOCM   Continue aspirin, Lipitor, diuretics.  * Hypertension   Continue Coreg, Cozaar, verapamil. * Diabetes  Lantus plus sliding scale coverage. Hold metformin.    All the records are reviewed and case discussed with Care Management/Social Workerr. Management plans discussed with the patient, family and they are in agreement.  CODE STATUS: Full code  TOTAL TIME TAKING CARE OF THIS PATIENT: .35 minutes.     POSSIBLE D/C IN 1-2 DAYS, DEPENDING ON CLINICAL CONDITION.   Vaughan Basta M.D on 09/05/2016   Between 7am to 6pm - Pager - [phone]  After 6pm go to [email or web address] - password [email or web address] [place] [facility]  Office  [phone]  CC: Primary care physician; [facility]  Note: This dictation was prepared with Dragon dictation along with smaller phrase technology. Any transcriptional errors that result from this process are unintentional.

## 2016-09-05 NOTE — Plan of Care (Signed)
Problem: Safety: Goal: Ability to remain free from injury will improve Outcome: Progressing Fall precautions in place  Problem: Pain Managment: Goal: General experience of comfort will improve Outcome: Progressing Prn medications  Problem: Tissue Perfusion: Goal: Risk factors for ineffective tissue perfusion will decrease Outcome: Progressing Heparin gtt

## 2016-09-05 NOTE — Consult Note (Signed)
Primary Physician: Primary Cardiologist:  Emogene Morgan     HPI: Pt is a 56 yo with history of hypertrophic CM (s/p ICD) , HTN, obesity and depresseion   She is normally followed at [facility] (Dr Marcelline Deist;   Echo:  LVEF >70%; IVS 26 mm.  Cath in [place]gust 2017  No signif CAD  Mild 20 mm gradient with Valsalva; 30 mm post PVC.  LVEDP 30 to 40 mm Hg; RA 27 PA 63/30; PCWP 25)  She is sp dual chamber ICD 11/2013   LVEDP 30  RA 27  Mild intracavitary gradient  Last seen in clinic on 10/31.    Remained  symptomatic.  Maintained on carvedilol, lasix, verapamil.   She was recently admitted to [facility] with syncope and CHF No arrhythmia detected Pt diuresed    Pt with borderline orthostatic changes after diuresis  Dry wt 224 lb  The pt spent 5 days at [place]  After D/C she felt OK for 1 day  Then she developed cough, achiness, congestion  She says that everyone incluiding nurses were sick at [facility] Cough nonproductive  DId have some achiness, N, diarrhea (worse yesterday)   Husband had been giving her OTC decongestants and cold aids Chest pressure different from usual pressure with volume overload  This is pleuritic Has had some dizziness with coughing  No syncope Wt up som  227 yesterday Came in yesterday when things were not getting any better   Troponin elevated at 0.2  Since admitThis has remained flat with minimal elevation/  She continues to feel week  Remains SOB  Still coughing  HA present that is worse with coughing  Chest pressure worse with breathing  Wheezing          Past Medical History:  Diagnosis Date  . CHF (congestive heart failure) ([place])   . Diabetes mellitus without complication ([place])   . Hypertension     Medications Prior to Admission  Medication Sig Dispense Refill  . aspirin EC 81 MG tablet Take 81 mg by mouth daily.    Marland Kitchen atorvastatin (LIPITOR) 20 MG tablet Take 20 mg by mouth daily.    . bumetanide (BUMEX) 1 MG tablet Take 1 mg by mouth 2 (two) times daily.    . carvedilol  (COREG) 25 MG tablet Take 25 mg by mouth 2 (two) times daily.    . citalopram (CELEXA) 20 MG tablet Take 20 mg by mouth daily.    . insulin aspart (NOVOLOG FLEXPEN) 100 UNIT/ML FlexPen Inject 12 Units into the skin 3 (three) times daily.    . insulin glargine (LANTUS) 100 UNIT/ML injection Inject 40 Units into the skin at bedtime.    Marland Kitchen losartan (COZAAR) 100 MG tablet Take 100 mg by mouth daily.    . metFORMIN (GLUCOPHAGE) 850 MG tablet Take 850 mg by mouth 3 (three) times daily.    . traZODone (DESYREL) 150 MG tablet Take 150 mg by mouth at bedtime.    . verapamil (CALAN-SR) 240 MG CR tablet Take 240 mg by mouth 2 (two) times daily.       Marland Kitchen aspirin EC  81 mg Oral Daily  . atorvastatin  20 mg Oral Daily  . bumetanide  1 mg Oral BID  . carvedilol  25 mg Oral BID WC  . chlorpheniramine-HYDROcodone  5 mL Oral Q12H  . citalopram  20 mg Oral Daily  . insulin aspart  0-20 Units Subcutaneous TID WC  . insulin aspart  0-5 Units Subcutaneous  QHS  . insulin glargine  40 Units Subcutaneous QHS  . losartan  100 mg Oral Daily  . methylPREDNISolone (SOLU-MEDROL) injection  60 mg Intravenous Q6H  . [START ON 09/06/2016] pneumococcal 13-valent conjugate vaccine  0.5 mL Intramuscular Tomorrow-1000  . sodium chloride flush  3 mL Intravenous Q12H  . traZODone  150 mg Oral QHS  . verapamil  240 mg Oral BID    Infusions: . heparin 950 Units/hr (09/05/16 0047)    No Known Allergies  Social History   Social History  . Marital status: Married    Spouse name: N/A  . Number of children: N/A  . Years of education: N/A   Occupational History  . Not on file.   Social History Main Topics  . Smoking status: Passive Smoke Exposure - Never Smoker  . Smokeless tobacco: Never Used  . Alcohol use No  . Drug use: Unknown  . Sexual activity: Not on file   Other Topics Concern  . Not on file   Social History Narrative  . No narrative on file    History reviewed. No pertinent family  history.  REVIEW OF SYSTEMS:  All systems reviewed  Negative to the above problem except as noted above.    PHYSICAL EXAM: Vitals:   09/05/16 0322 09/05/16 0757  BP: 140/70 (!) 158/78  Pulse: (!) 55 63  Resp: 18 17  Temp: 98.2 F (36.8 C) 97.7 F (36.5 C)     Intake/Output Summary (Last 24 hours) at 09/05/16 0953 Last data filed at 09/05/16 7829  Gross per 24 hour  Intake            61.27 ml  Output              500 ml  Net          -438.73 ml    General:  Pt is an ill appearing 56 yo in NAD   HEENT: normal Neck: supple. no JVD. Carotids 2+ bilat; no bruits. No lymphadenopathy or thryomegaly appreciated. Cor: PMI nondisplaced. Regular rate & rhythm. No rubs, gallops  Gr I-II/VI systolic murmur  Lungs: Rales at L base with mild wheezing particularly in upper lung fileds   Abdomen: soft, nontender, nondistended. No hepatosplenomegaly. No bruits or masses. Good bowel sounds. Extremities: no cyanosis, clubbing, rash,  Triv edema Neuro: alert & oriented x 3, cranial nerves grossly intact. moves all 4 extremities w/o difficulty.   ECG:  SR with occasional apacing  LVH    Results for orders placed or performed during the hospital encounter of 09/04/16 (from the past 24 hour(s))  Influenza panel by PCR (type A & B)     Status: None   Collection Time: 09/04/16  8:19 PM  Result Value Ref Range   Influenza A By PCR NEGATIVE NEGATIVE   Influenza B By PCR NEGATIVE NEGATIVE  CBC with Differential     Status: Abnormal   Collection Time: 09/04/16  8:19 PM  Result Value Ref Range   WBC 5.5 3.6 - 11.0 K/uL   RBC 4.64 3.80 - 5.20 MIL/uL   Hemoglobin 12.3 12.0 - 16.0 g/dL   HCT 37.0 35.0 - 47.0 %   MCV 79.7 (L) 80.0 - 100.0 fL   MCH 26.5 26.0 - 34.0 pg   MCHC 33.2 32.0 - 36.0 g/dL   RDW 17.2 (H) 11.5 - 14.5 %   Platelets 216 150 - 440 K/uL   Neutrophils Relative % 61 %   Neutro Abs 3.3 1.4 -  6.5 K/uL   Lymphocytes Relative 16 %   Lymphs Abs 0.9 (L) 1.0 - 3.6 K/uL   Monocytes  Relative 21 %   Monocytes Absolute 1.1 (H) 0.2 - 0.9 K/uL   Eosinophils Relative 3 %   Eosinophils Absolute 0.1 0 - 0.7 K/uL   Basophils Relative 1 %   Basophils Absolute 0.0 0 - 0.1 K/uL  Troponin I     Status: Abnormal   Collection Time: 09/04/16  8:29 PM  Result Value Ref Range   Troponin I 0.21 (HH) <0.03 ng/mL  Comprehensive metabolic panel     Status: Abnormal   Collection Time: 09/04/16  8:29 PM  Result Value Ref Range   Sodium 140 135 - 145 mmol/L   Potassium 4.3 3.5 - 5.1 mmol/L   Chloride 106 101 - 111 mmol/L   CO2 23 22 - 32 mmol/L   Glucose, Bld 170 (H) 65 - 99 mg/dL   BUN 16 6 - 20 mg/dL   Creatinine, Ser 0.94 0.44 - 1.00 mg/dL   Calcium 9.1 8.9 - 10.3 mg/dL   Total Protein 7.5 6.5 - 8.1 g/dL   Albumin 3.8 3.5 - 5.0 g/dL   AST 23 15 - 41 U/L   ALT 18 14 - 54 U/L   Alkaline Phosphatase 70 38 - 126 U/L   Total Bilirubin 0.4 0.3 - 1.2 mg/dL   GFR calc non Af Amer >60 >60 mL/min   GFR calc Af Amer >60 >60 mL/min   Anion gap 11 5 - 15  APTT     Status: None   Collection Time: 09/04/16  8:29 PM  Result Value Ref Range   aPTT 31 24 - 36 seconds  Protime-INR     Status: None   Collection Time: 09/04/16  8:29 PM  Result Value Ref Range   Prothrombin Time 13.2 11.4 - 15.2 seconds   INR 1.00   Troponin I     Status: Abnormal   Collection Time: 09/05/16  3:14 AM  Result Value Ref Range   Troponin I 0.21 (HH) <0.03 ng/mL  Brain natriuretic peptide     Status: Abnormal   Collection Time: 09/05/16  3:14 AM  Result Value Ref Range   B Natriuretic Peptide 559.0 (H) 0.0 - 100.0 pg/mL  Basic metabolic panel     Status: Abnormal   Collection Time: 09/05/16  3:14 AM  Result Value Ref Range   Sodium 140 135 - 145 mmol/L   Potassium 3.8 3.5 - 5.1 mmol/L   Chloride 106 101 - 111 mmol/L   CO2 25 22 - 32 mmol/L   Glucose, Bld 122 (H) 65 - 99 mg/dL   BUN 15 6 - 20 mg/dL   Creatinine, Ser 0.88 0.44 - 1.00 mg/dL   Calcium 8.8 (L) 8.9 - 10.3 mg/dL   GFR calc non Af Amer >60  >60 mL/min   GFR calc Af Amer >60 >60 mL/min   Anion gap 9 5 - 15  CBC     Status: Abnormal   Collection Time: 09/05/16  3:14 AM  Result Value Ref Range   WBC 5.9 3.6 - 11.0 K/uL   RBC 4.51 3.80 - 5.20 MIL/uL   Hemoglobin 12.0 12.0 - 16.0 g/dL   HCT 35.9 35.0 - 47.0 %   MCV 79.6 (L) 80.0 - 100.0 fL   MCH 26.7 26.0 - 34.0 pg   MCHC 33.5 32.0 - 36.0 g/dL   RDW 17.3 (H) 11.5 - 14.5 %   Platelets  229 150 - 440 K/uL  Lipid panel     Status: Abnormal   Collection Time: 09/05/16  3:14 AM  Result Value Ref Range   Cholesterol 90 0 - 200 mg/dL   Triglycerides 60 <150 mg/dL   HDL 30 (L) >40 mg/dL   Total CHOL/HDL Ratio 3.0 RATIO   VLDL 12 0 - 40 mg/dL   LDL Cholesterol 48 0 - 99 mg/dL  TSH     Status: None   Collection Time: 09/05/16  3:14 AM  Result Value Ref Range   TSH 1.152 0.350 - 4.500 uIU/mL  Heparin level (unfractionated)     Status: None   Collection Time: 09/05/16  5:06 AM  Result Value Ref Range   Heparin Unfractionated 0.45 0.30 - 0.70 IU/mL  Protime-INR     Status: None   Collection Time: 09/05/16  5:06 AM  Result Value Ref Range   Prothrombin Time 14.2 11.4 - 15.2 seconds   INR 1.10   Glucose, capillary     Status: Abnormal   Collection Time: 09/05/16  7:49 AM  Result Value Ref Range   Glucose-Capillary 186 (H) 65 - 99 mg/dL  Troponin I     Status: Abnormal   Collection Time: 09/05/16  8:50 AM  Result Value Ref Range   Troponin I 0.20 (HH) <0.03 ng/mL   Dg Chest 2 View  Result Date: 09/04/2016 CLINICAL DATA:  Shortness breath and cough for 3 days.  Diarrhea. EXAM: CHEST  2 VIEW COMPARISON:  None. FINDINGS: Borderline cardiomegaly noted. AICD is seen in appropriate position. Both lungs are clear. No evidence of pleural effusion or pneumothorax. IMPRESSION: No active cardiopulmonary disease. Electronically Signed   By: Earle Gell M.D.   On: 09/04/2016 21:34     ASSESSMENT:    Pt is a 56 yo with hx of hypertrophic cardiomyopathy. Usually follows at [facility] in  [place] 2017  As noted above  Recently discharged  Since d/c has had cough, congestion, SOB  Developed Chest pressure that is more pleuritic.  Presented yesterday to [facility] On exam, pt is uncomfortable appearing  She is moving air but has mild wheezing  Coughing during eval.  Mild rales at bases. Labs signif for very mild elevation of trop with flat curve.  I am not convinced of active coronary ischemia Would d/c heparin Trop elevation most likely reflects demand ischemia/subendocardial ischemia in setting of severe LVH in setting of current viral infecton  Her cath findings last fall with increased LVEDP with Valsalva support that she continues to have increased diastolic dysfunction with her extensive coughing .  I would recomm supportive care for pulmonary complaints  Tussenex has helped with coughing  I would maximize dosing  I would also recomm inhaled (NMT) in addition to oral steroids to alleviate bronchoconstriction.  Albuterol NMTs also, with close following of HR.  I would give an additional dose of IV Lasix now.  CLose f/u of I/O  Will continue to follow with you.

## 2016-09-05 NOTE — Progress Notes (Signed)
Patient is admitted to room 254 with the diagnosis of NSTEMI.  Alert and oriented x 4. Tele box called to CCMD by Estill Bamberg NT and Harriette Bouillon. Fall risk assessment done and contract signed.  Skin assessment done with Vincente Liberty RN, no skin issues of concerns noted. No acute respiratory distress noted. Patient was coughing continuously and also complained of chest pain of 8/10 on the pain scale. Dr. Estanislado Pandy notified with a new order for Tussionex 5 mg oral every 12 hours for cough and percocet 5/325 mg 1 tab oral every 6 hours PRN for pain. The RN was also ordered to discontinued the Delsym and Mucinex ordered previously as they are duplicates.  Husband at bedtime voiced no concerns. Will continue to monitor.

## 2016-09-05 NOTE — Progress Notes (Signed)
Dr. Anselm Jungling notified of [place] 461, new orders received.

## 2016-09-05 NOTE — Progress Notes (Signed)
ANTICOAGULATION CONSULT NOTE - Initial Consult  Pharmacy Consult for heparin Indication: chest pain/ACS  No Known Allergies  Patient Measurements: Height: 5\' 4"  (162.6 cm) Weight: 227 lb (103 kg) IBW/kg (Calculated) : 54.7 Heparin Dosing Weight: 78.8 kg  Vital Signs: Temp: 98.5 F (36.9 C) (01/20 2016) Temp Source: Oral (01/20 2016) BP: 160/97 (01/21 0017) Pulse Rate: 100 (01/21 0017)  Labs:  Recent Labs  09/04/16 2019 09/04/16 2029  HGB 12.3  --   HCT 37.0  --   PLT 216  --   APTT  --  31  LABPROT  --  13.2  INR  --  1.00  CREATININE  --  0.94  TROPONINI  --  0.21*    Estimated Creatinine Clearance: 77.1 mL/min (by C-G formula based on SCr of 0.94 mg/dL).   Medical History: Past Medical History:  Diagnosis Date  . CHF (congestive heart failure) ([place])   . Diabetes mellitus without complication ([place])   . Hypertension     Medications:  Infusions:  . heparin      Assessment: 55 yof cc cough/SOB. Troponin positive x 1, nonspecific changes noted in EKG. Cardiology consulted and they agree with starting UFH. Pharmacy consulted to dose UFH for ACS. No OAC on PTA list.   Goal of Therapy:  Heparin level 0.3-0.7 units/ml Monitor platelets by anticoagulation protocol: Yes   Plan:  Give 4000 units bolus x 1 Start heparin infusion at 950 units/hr Check anti-Xa level in 8 hours and daily while on heparin Continue to monitor H&H and platelets  Laural Benes, Pharm.D., BCPS Clinical Pharmacist 09/05/2016,12:33 AM

## 2016-09-05 NOTE — Progress Notes (Signed)
CBG=498, Dr. Ara Kussmaul notified with a new order for 6 units of Novolog x 1 dose. Will continue to monitor.

## 2016-09-06 DIAGNOSIS — I248 Other forms of acute ischemic heart disease: Secondary | ICD-10-CM

## 2016-09-06 DIAGNOSIS — J40 Bronchitis, not specified as acute or chronic: Secondary | ICD-10-CM

## 2016-09-06 DIAGNOSIS — I421 Obstructive hypertrophic cardiomyopathy: Secondary | ICD-10-CM

## 2016-09-06 DIAGNOSIS — I422 Other hypertrophic cardiomyopathy: Secondary | ICD-10-CM

## 2016-09-06 LAB — GLUCOSE, CAPILLARY
GLUCOSE-CAPILLARY: 282 mg/dL — AB (ref 65–99)
GLUCOSE-CAPILLARY: 343 mg/dL — AB (ref 65–99)
Glucose-Capillary: 325 mg/dL — ABNORMAL HIGH (ref 65–99)
Glucose-Capillary: 332 mg/dL — ABNORMAL HIGH (ref 65–99)

## 2016-09-06 MED ORDER — INSULIN ASPART 100 UNIT/ML IV SOLN
10.0000 [IU] | Freq: Three times a day (TID) | INTRAVENOUS | Status: DC
  Filled 2016-09-06 (×2): qty 0.1

## 2016-09-06 MED ORDER — INSULIN ASPART 100 UNIT/ML ~~LOC~~ SOLN
10.0000 [IU] | Freq: Three times a day (TID) | SUBCUTANEOUS | Status: DC
  Administered 2016-09-06 – 2016-09-09 (×8): 10 [IU] via SUBCUTANEOUS
  Filled 2016-09-06 (×9): qty 10

## 2016-09-06 MED ORDER — CEFUROXIME AXETIL 500 MG PO TABS
250.0000 mg | ORAL_TABLET | Freq: Two times a day (BID) | ORAL | Status: DC
  Administered 2016-09-06 – 2016-09-08 (×4): 250 mg via ORAL
  Filled 2016-09-06 (×4): qty 1

## 2016-09-06 MED ORDER — AZITHROMYCIN 250 MG PO TABS
250.0000 mg | ORAL_TABLET | Freq: Every day | ORAL | Status: AC
  Administered 2016-09-07 – 2016-09-10 (×4): 250 mg via ORAL
  Filled 2016-09-06 (×5): qty 1

## 2016-09-06 MED ORDER — AZITHROMYCIN 500 MG IV SOLR
250.0000 mg | Freq: Every day | INTRAVENOUS | Status: DC
  Administered 2016-09-06: 250 mg via INTRAVENOUS
  Filled 2016-09-06: qty 250

## 2016-09-06 MED ORDER — DOCUSATE SODIUM 100 MG PO CAPS
100.0000 mg | ORAL_CAPSULE | Freq: Two times a day (BID) | ORAL | Status: DC
  Administered 2016-09-07 – 2016-09-11 (×9): 100 mg via ORAL
  Filled 2016-09-06 (×9): qty 1

## 2016-09-06 MED ORDER — MENTHOL 3 MG MT LOZG
1.0000 | LOZENGE | OROMUCOSAL | Status: DC | PRN
  Administered 2016-09-07: 3 mg via ORAL
  Filled 2016-09-06: qty 9

## 2016-09-06 NOTE — Progress Notes (Signed)
Inpatient Diabetes Program Recommendations  AACE/ADA: New Consensus Statement on Inpatient Glycemic Control (2015)  Target Ranges:  Prepandial:   less than 140 mg/dL      Peak postprandial:   less than 180 mg/dL (1-2 hours)      Critically ill patients:  140 - 180 mg/dL   Results for AMYAH, CLAWSON (MRN [id number]) as of 09/06/2016 08:40  Ref. Range 09/05/2016 07:49 09/05/2016 12:06 09/05/2016 17:13 09/05/2016 22:05 09/06/2016 07:48  Glucose-Capillary Latest Ref Range: 65 - 99 mg/dL 186 (H) 320 (H) 461 (H) 498 (H) 282 (H)   Review of Glycemic Control  Diabetes history: DM2 Outpatient Diabetes medications: Lantus 40 units QHS, Novolog 12 units TID with meals, Metformin 850 mg TID Current orders for Inpatient glycemic control: Lantus 40 units QHS, Novolog 0-20 units TID with meals, Novolog 0-5 units QHS  Inpatient Diabetes Program Recommendations: Insulin - Basal: Noted patient did not receive any Lantus on 09/04/16 and glucose up to 498 mg/dl on 09/05/16. Fasting glucose 282 mg/dl this morning and patient is ordered Solumedrol 60 mg Q6H which is contributing to hyperglycemia. Please consider increasing Lantus to 45 units QHS. Insulin - Meal Coverage: Please consider ordering Novolog 5 units TID with meals for meal coverage if patient is eating at least 50% of meals. HgbA1C: A1C in process.  Thanks, Barnie Alderman, RN, MSN, CDE Diabetes Coordinator Inpatient Diabetes Program [phone] (Team Pager from 8am to 5pm)

## 2016-09-06 NOTE — Progress Notes (Signed)
Continues to cough and wheeze,iv antibiotics in progress,iv steriods on board,pain management,uncontrolled blood sugars,hemodynamics within normal limits.

## 2016-09-06 NOTE — Progress Notes (Signed)
Progress Note  Patient Name: Julie Bender Date of Encounter: 09/06/2016  Primary Cardiologist: Dr. Marcelline Deist at [place]    She still has significant shortness of breath, wheezing and productive cough.  Inpatient Medications    Scheduled Meds: . aspirin EC  81 mg Oral Daily  . atorvastatin  20 mg Oral Daily  . budesonide (PULMICORT) nebulizer solution  0.25 mg Nebulization BID  . bumetanide  1 mg Oral BID  . carvedilol  25 mg Oral BID WC  . cefUROXime (ZINACEF)  IV  250 mg Intravenous Q12H  . chlorpheniramine-HYDROcodone  5 mL Oral Q12H  . citalopram  20 mg Oral Daily  . insulin aspart  0-20 Units Subcutaneous TID WC  . insulin aspart  0-5 Units Subcutaneous QHS  . insulin glargine  40 Units Subcutaneous QHS  . ipratropium-albuterol  3 mL Nebulization Q4H  . losartan  100 mg Oral Daily  . methylPREDNISolone (SOLU-MEDROL) injection  60 mg Intravenous Q6H  . pneumococcal 13-valent conjugate vaccine  0.5 mL Intramuscular Tomorrow-1000  . sodium chloride flush  3 mL Intravenous Q12H  . traZODone  150 mg Oral QHS  . verapamil  240 mg Oral BID   Continuous Infusions:  PRN Meds: sodium chloride, acetaminophen, ALPRAZolam, benzonatate, benzonatate, guaiFENesin-codeine, ipratropium-albuterol, ipratropium-albuterol, nitroGLYCERIN, ondansetron (ZOFRAN) IV, oxyCODONE-acetaminophen, sodium chloride flush, zolpidem   Vital Signs    Vitals:   09/05/16 2054 09/06/16 0406 09/06/16 0759 09/06/16 0800  BP:  (!) 141/64  (!) 142/66  Pulse:  63  62  Resp:  18    Temp:  97.7 F (36.5 C)  98.2 F (36.8 C)  TempSrc:  Oral  Oral  SpO2: 98% 97% 90% 91%  Weight:  224 lb 3.2 oz (101.7 kg)    Height:        Intake/Output Summary (Last 24 hours) at 09/06/16 0914 Last data filed at 09/06/16 0133  Gross per 24 hour  Intake              773 ml  Output             3500 ml  Net            -2727 ml   Filed Weights   09/04/16 2237 09/05/16 0322 09/06/16 0406  Weight: 227 lb (103  kg) 224 lb (101.6 kg) 224 lb 3.2 oz (101.7 kg)    Telemetry    NSR - Personally Reviewed  ECG    NSR, LVH, LAFB - Personally Reviewed  Physical Exam   GEN: No acute distress.  Neck: No JVD Cardiac: RRR, no  rubs, or gallops. 2/6 SEM at LSB Respiratory:  bilateral rhonchi and inspiratory wheezing GI: Soft, nontender, non-distended  MS: No edema; No deformity. Neuro:  AAOx3. Psych: Normal affect  Labs    Chemistry Recent Labs Lab 09/04/16 2029 09/05/16 0314  NA 140 140  K 4.3 3.8  CL 106 106  CO2 23 25  GLUCOSE 170* 122*  BUN 16 15  CREATININE 0.94 0.88  CALCIUM 9.1 8.8*  PROT 7.5  --   ALBUMIN 3.8  --   AST 23  --   ALT 18  --   ALKPHOS 70  --   BILITOT 0.4  --   GFRNONAA >60 >60  GFRAA >60 >60  ANIONGAP 11 9     Hematology Recent Labs Lab 09/04/16 2019 09/05/16 0314  WBC 5.5 5.9  RBC 4.64 4.51  HGB 12.3 12.0  HCT 37.0 35.9  MCV  79.7* 79.6*  MCH 26.5 26.7  MCHC 33.2 33.5  RDW 17.2* 17.3*  PLT 216 229    Cardiac Enzymes Recent Labs Lab 09/04/16 2029 09/05/16 0314 09/05/16 0850 09/05/16 1454  TROPONINI 0.21* 0.21* 0.20* 0.20*   No results for input(s): TROPIPOC in the last 168 hours.   BNP Recent Labs Lab 09/05/16 0314  BNP 559.0*     DDimer No results for input(s): DDIMER in the last 168 hours.   Radiology    Dg Chest 2 View  Result Date: 09/04/2016 CLINICAL DATA:  Shortness breath and cough for 3 days.  Diarrhea. EXAM: CHEST  2 VIEW COMPARISON:  None. FINDINGS: Borderline cardiomegaly noted. AICD is seen in appropriate position. Both lungs are clear. No evidence of pleural effusion or pneumothorax. IMPRESSION: No active cardiopulmonary disease. Electronically Signed   By: Earle Gell M.D.   On: 09/04/2016 21:34    Cardiac Studies   None  Patient Profile     56 y.o. female  with history of hypertrophic CM (s/p ICD) , HTN, obesity and depresseion   She is normally [facility] at [facility] (Dr Marcelline Deist;   Echo:  LVEF >70%; IVS 26 mm.   Cath in [place] 2017. No signif CAD, Mild 20 mm gradient with Valsalva; 30 mm post PVC.  LVEDP 30 to 40 mm Hg; RA 27 PA 63/30; PCWP 25)  She is sp dual chamber ICD 11/2013 . She presented with symptoms of bronchitis after recent discharge from the emergency. Troponin was elevated.  Assessment & Plan    1. Elevated troponin level: Likely due to supply demand ischemia in the setting of cardiomyopathy and pulmonary infection. The patient had previous cardiac catheterization last year that showed no significant coronary artery disease. No further ischemic workup is recommended.  2.  Hypertrophic obstructive cardiomyopathy: Seems to be stable overall. She is on verapamil and carvedilol. She received one dose of IV furosemide yesterday. She appears to be euvolemic today. Can continue home dose of Bumex.  3. Acute bronchitis: Recent she continues to have significant shortness of breath, wheezing and cough. Continue supportive care.  Signed, Kathlyn Sacramento, MD  09/06/2016, 9:14 AM

## 2016-09-07 ENCOUNTER — Inpatient Hospital Stay: Payer: BC Managed Care – PPO

## 2016-09-07 LAB — HEMOGLOBIN A1C
Hgb A1c MFr Bld: 8 % — ABNORMAL HIGH (ref 4.8–5.6)
Mean Plasma Glucose: 183 mg/dL

## 2016-09-07 LAB — GLUCOSE, CAPILLARY
GLUCOSE-CAPILLARY: 359 mg/dL — AB (ref 65–99)
Glucose-Capillary: 296 mg/dL — ABNORMAL HIGH (ref 65–99)
Glucose-Capillary: 330 mg/dL — ABNORMAL HIGH (ref 65–99)
Glucose-Capillary: 395 mg/dL — ABNORMAL HIGH (ref 65–99)

## 2016-09-07 MED ORDER — ENOXAPARIN SODIUM 40 MG/0.4ML ~~LOC~~ SOLN
40.0000 mg | SUBCUTANEOUS | Status: DC
Start: 2016-09-07 — End: 2016-09-11
  Administered 2016-09-07 – 2016-09-10 (×4): 40 mg via SUBCUTANEOUS
  Filled 2016-09-07 (×4): qty 0.4

## 2016-09-07 MED ORDER — BUDESONIDE 0.5 MG/2ML IN SUSP
0.5000 mg | Freq: Two times a day (BID) | RESPIRATORY_TRACT | Status: DC
  Administered 2016-09-07 – 2016-09-11 (×8): 0.5 mg via RESPIRATORY_TRACT
  Filled 2016-09-07 (×8): qty 2

## 2016-09-07 MED ORDER — METHYLPREDNISOLONE SODIUM SUCC 125 MG IJ SOLR
60.0000 mg | Freq: Every day | INTRAMUSCULAR | Status: DC
Start: 2016-09-08 — End: 2016-09-08
  Administered 2016-09-08: 60 mg via INTRAVENOUS
  Filled 2016-09-07: qty 2

## 2016-09-07 NOTE — Progress Notes (Signed)
Inpatient Diabetes Program Recommendations  AACE/ADA: New Consensus Statement on Inpatient Glycemic Control (2015)  Target Ranges:  Prepandial:   less than 140 mg/dL      Peak postprandial:   less than 180 mg/dL (1-2 hours)      Critically ill patients:  140 - 180 mg/dL   Results for Julie Bender, Julie Bender (MRN [id number]) as of 09/07/2016 11:44  Ref. Range 09/06/2016 07:48 09/06/2016 11:33 09/06/2016 17:07 09/06/2016 21:14 09/07/2016 07:41 09/07/2016 11:43  Glucose-Capillary Latest Ref Range: 65 - 99 mg/dL 282 (H) 343 (H) 325 (H) 332 (H) 296 (H) 330 (H)  Results for Julie Bender, Julie Bender (MRN [id number]) as of 09/07/2016 11:44  Ref. Range 09/05/2016 03:14  Hemoglobin A1C Latest Ref Range: 4.8 - 5.6 % 8.0 (H)   Review of Glycemic Control  Diabetes history: DM2 Outpatient Diabetes medications: Lantus 40 units QHS, Novolog 12 units TID with meals, Metformin 850 mg TID Current orders for Inpatient glycemic control: Lantus 40 units QHS, Novolog 0-20 units TID with meals, Novolog 0-5 units QHS, Novolog 10 units TID with meals for meal coverage  Inpatient Diabetes Program Recommendations: Insulin - Basal: If steroids are continued as ordered, please consider increasing Lantus to 45 units QHS. Insulin - Meal Coverage: If steroids are continued as ordered, please consider increasing meal coverage to Novolog 15 units TID with meals.  Thanks, Barnie Alderman, RN, MSN, CDE Diabetes Coordinator Inpatient Diabetes Program [phone] (Team Pager from 8am to 5pm)

## 2016-09-07 NOTE — Progress Notes (Signed)
CT chest reveals pneumonia,patient already on antibiotics,chest pain continues,coughing and sore throat continues,tolerating 2 liters of oxygen.

## 2016-09-07 NOTE — Progress Notes (Signed)
[place] at [place] NAME: Julie Bender    MR#:  [id number]  DATE OF BIRTH:  1960/10/18  SUBJECTIVE:  CHIEF COMPLAINT:   Chief Complaint  Patient presents with  . Cough  . Shortness of Breath   K with cough and shortness of breath. Noted to have slightly elevated troponin with some complaint of chest tightness so given his admission for non-ST elevation MI after starting on heparin IV drip. Her troponin remained stable so cardiologist DC'd heparin IV drip. Patient still her complain of cough with shortness of breath.  she feels worse today with lot of wheezing and cough. Also c/o throat pain.  REVIEW OF SYSTEMS:  CONSTITUTIONAL: No fever, fatigue or weakness.  EYES: No blurred or double vision.  EARS, NOSE, AND THROAT: No tinnitus or ear pain.  RESPIRATORY: Positive for cough, shortness of breath, some wheezing , no hemoptysis.  CARDIOVASCULAR: No chest pain, orthopnea, edema.  GASTROINTESTINAL: No nausea, vomiting, diarrhea or abdominal pain.  GENITOURINARY: No dysuria, hematuria.  ENDOCRINE: No polyuria, nocturia,  HEMATOLOGY: No anemia, easy bruising or bleeding SKIN: No rash or lesion. MUSCULOSKELETAL: No joint pain or arthritis.   NEUROLOGIC: No tingling, numbness, weakness.  PSYCHIATRY: No anxiety or depression.   ROS  DRUG ALLERGIES:  No Known Allergies  VITALS:  Blood pressure 134/64, pulse (!) 58, temperature 97.8 F (36.6 C), temperature source Oral, resp. rate 18, height 5\' 4"  (1.626 m), weight 104.8 kg (231 lb 1.6 oz), SpO2 93 %.  PHYSICAL EXAMINATION:  GENERAL:  56 y.o.-year-old patient lying in the bed with no acute distress.  EYES: Pupils equal, round, reactive to light and accommodation. No scleral icterus. Extraocular muscles intact.  HEENT: Head atraumatic, normocephalic. Oropharynx and nasopharynx clear.  NECK:  Supple, no jugular venous distention. No thyroid enlargement, no tenderness.  LUNGS: Normal breath  sounds bilaterally, Mild wheezing, no crepitation. No use of accessory muscles of respiration.  CARDIOVASCULAR: S1, S2 normal. No murmurs, rubs, or gallops.  ABDOMEN: Soft, nontender, nondistended. Bowel sounds present. No organomegaly or mass.  EXTREMITIES: No pedal edema, cyanosis, or clubbing.  NEUROLOGIC: Cranial nerves II through XII are intact. Muscle strength 5/5 in all extremities. Sensation intact. Gait not checked.  PSYCHIATRIC: The patient is alert and oriented x 3.  SKIN: No obvious rash, lesion, or ulcer.   Physical Exam LABORATORY PANEL:   CBC  Recent Labs Lab 09/05/16 0314  WBC 5.9  HGB 12.0  HCT 35.9  PLT 229   ------------------------------------------------------------------------------------------------------------------  Chemistries   Recent Labs Lab 09/04/16 2029 09/05/16 0314  NA 140 140  K 4.3 3.8  CL 106 106  CO2 23 25  GLUCOSE 170* 122*  BUN 16 15  CREATININE 0.94 0.88  CALCIUM 9.1 8.8*  AST 23  --   ALT 18  --   ALKPHOS 70  --   BILITOT 0.4  --    ------------------------------------------------------------------------------------------------------------------  Cardiac Enzymes  Recent Labs Lab 09/05/16 0850 09/05/16 1454  TROPONINI 0.20* 0.20*   ------------------------------------------------------------------------------------------------------------------  RADIOLOGY:  No results found.  ASSESSMENT AND PLAN:   Principal Problem:   Bronchitis Active Problems:   Demand ischemia (HCC)   HOCM (hypertrophic obstructive cardiomyopathy) ([place])  * NSTEMI- was suspected on admission but ruled out by 3 troponins negative. Remained stable on telemetry.   Appreciated cardiology consult, suggested to stop heparin IV drip.  * Acute bronchitis   This is mainly the reason for her cough and chest tightness.  oral azithromycin. Added cefuroxime.  given some bronchodilators with cough suppressants.   Influenza test is negative, no  pneumonia on chest x-ray.   Added losenges for comfort of her throat.   She is on IV steroids.  * History of HOCM   Continue aspirin, Lipitor, diuretics.  * Hypertension   Continue Coreg, Cozaar, verapamil. * Diabetes  Lantus plus sliding scale coverage. Hold metformin.  Provide meal time coverage.   All the records are reviewed and case discussed with Care Management/Social Workerr. Management plans discussed with the patient, family and they are in agreement.  CODE STATUS: Full code  TOTAL TIME TAKING CARE OF THIS PATIENT: .35 minutes.     POSSIBLE D/C IN 1-2 DAYS, DEPENDING ON CLINICAL CONDITION.   Vaughan Basta M.D on 09/07/2016   Between 7am to 6pm - Pager - [phone]  After 6pm go to [email or web address] - password [email or web address] [place] [facility]  Office  [phone]  CC: Primary care physician; [facility]  Note: This dictation was prepared with Dragon dictation along with smaller phrase technology. Any transcriptional errors that result from this process are unintentional.

## 2016-09-08 LAB — GLUCOSE, CAPILLARY
GLUCOSE-CAPILLARY: 399 mg/dL — AB (ref 65–99)
GLUCOSE-CAPILLARY: 341 mg/dL — AB (ref 65–99)
Glucose-Capillary: 292 mg/dL — ABNORMAL HIGH (ref 65–99)
Glucose-Capillary: 418 mg/dL — ABNORMAL HIGH (ref 65–99)
Glucose-Capillary: 321 mg/dL — ABNORMAL HIGH (ref 65–99)

## 2016-09-08 LAB — CBC
HEMATOCRIT: 36.8 % (ref 35.0–47.0)
Hemoglobin: 12 g/dL (ref 12.0–16.0)
MCH: 26.3 pg (ref 26.0–34.0)
MCHC: 32.7 g/dL (ref 32.0–36.0)
MCV: 80.4 fL (ref 80.0–100.0)
PLATELETS: 252 10*3/uL (ref 150–440)
RBC: 4.57 MIL/uL (ref 3.80–5.20)
RDW: 17.3 % — ABNORMAL HIGH (ref 11.5–14.5)
WBC: 15.9 10*3/uL — ABNORMAL HIGH (ref 3.6–11.0)

## 2016-09-08 LAB — BASIC METABOLIC PANEL
ANION GAP: 7 (ref 5–15)
BUN: 41 mg/dL — AB (ref 6–20)
CHLORIDE: 96 mmol/L — AB (ref 101–111)
CO2: 30 mmol/L (ref 22–32)
Calcium: 9.1 mg/dL (ref 8.9–10.3)
Creatinine, Ser: 0.95 mg/dL (ref 0.44–1.00)
GFR calc Af Amer: >60 mL/min (ref >60)
GLUCOSE: 290 mg/dL — AB (ref 65–99)
POTASSIUM: 4.3 mmol/L (ref 3.5–5.1)
Sodium: 133 mmol/L — ABNORMAL LOW (ref 135–145)

## 2016-09-08 LAB — MRSA PCR SCREENING: MRSA by PCR: NEGATIVE

## 2016-09-08 MED ORDER — INSULIN GLARGINE 100 UNIT/ML ~~LOC~~ SOLN
45.0000 [IU] | Freq: Every day | SUBCUTANEOUS | Status: DC
  Administered 2016-09-08 – 2016-09-10 (×3): 45 [IU] via SUBCUTANEOUS
  Filled 2016-09-08 (×4): qty 0.45

## 2016-09-08 MED ORDER — METHYLPREDNISOLONE SODIUM SUCC 40 MG IJ SOLR
40.0000 mg | Freq: Two times a day (BID) | INTRAMUSCULAR | Status: DC
  Administered 2016-09-08 – 2016-09-10 (×4): 40 mg via INTRAVENOUS
  Filled 2016-09-08 (×4): qty 1

## 2016-09-08 MED ORDER — DEXTROSE 5 % IV SOLN: 1.0000 g | INTRAVENOUS | Status: DC

## 2016-09-08 MED ORDER — CEFTRIAXONE SODIUM-DEXTROSE 1-3.74 GM-% IV SOLR
1.0000 g | Freq: Every day | INTRAVENOUS | Status: DC
  Administered 2016-09-08 – 2016-09-10 (×3): 1 g via INTRAVENOUS
  Filled 2016-09-08 (×4): qty 50

## 2016-09-08 MED ORDER — FUROSEMIDE 10 MG/ML IJ SOLN
20.0000 mg | Freq: Once | INTRAMUSCULAR | Status: AC
Start: 2016-09-08 — End: 2016-09-08
  Administered 2016-09-08: 20 mg via INTRAVENOUS
  Filled 2016-09-08: qty 2

## 2016-09-08 NOTE — Progress Notes (Signed)
[place] at [place] NAME: Julie Bender    MR#:  [id number]  DATE OF BIRTH:  28-Jun-1961  SUBJECTIVE:  CHIEF COMPLAINT:   Chief Complaint  Patient presents with  . Cough  . Shortness of Breath   came with cough and shortness of breath. Noted to have slightly elevated troponin with some complaint of chest tightness so given as admission for non-ST elevation MI after starting on heparin IV drip. Her troponin remained stable so cardiologist DC'd heparin IV drip. Patient still her complain of cough with shortness of breath.  she feels worse today with lot of wheezing and cough. Also c/o throat pain.   inspite of getting IV steroids, nebs and Abx for 2 days- she did not improve- get CT chest today. Encouraged to get sputum culture.  REVIEW OF SYSTEMS:  CONSTITUTIONAL: No fever, fatigue or weakness.  EYES: No blurred or double vision.  EARS, NOSE, AND THROAT: No tinnitus or ear pain.  RESPIRATORY: Positive for cough, shortness of breath, some wheezing , no hemoptysis.  CARDIOVASCULAR: No chest pain, orthopnea, edema.  GASTROINTESTINAL: No nausea, vomiting, diarrhea or abdominal pain.  GENITOURINARY: No dysuria, hematuria.  ENDOCRINE: No polyuria, nocturia,  HEMATOLOGY: No anemia, easy bruising or bleeding SKIN: No rash or lesion. MUSCULOSKELETAL: No joint pain or arthritis.   NEUROLOGIC: No tingling, numbness, weakness.  PSYCHIATRY: No anxiety or depression.   ROS  DRUG ALLERGIES:  No Known Allergies  VITALS:  Blood pressure (!) 133/59, pulse (!) 59, temperature 97.8 F (36.6 C), temperature source Oral, resp. rate 19, height 5\' 4"  (1.626 m), weight 104.5 kg (230 lb 6.4 oz), SpO2 99 %.  PHYSICAL EXAMINATION:  GENERAL:  56 y.o.-year-old patient lying in the bed with no acute distress.  EYES: Pupils equal, round, reactive to light and accommodation. No scleral icterus. Extraocular muscles intact.  HEENT: Head atraumatic, normocephalic.  Oropharynx and nasopharynx clear.  NECK:  Supple, no jugular venous distention. No thyroid enlargement, no tenderness.  LUNGS: Normal breath sounds bilaterally, Mild wheezing, no crepitation. No use of accessory muscles of respiration.  CARDIOVASCULAR: S1, S2 normal. No murmurs, rubs, or gallops.  ABDOMEN: Soft, nontender, nondistended. Bowel sounds present. No organomegaly or mass.  EXTREMITIES: No pedal edema, cyanosis, or clubbing.  NEUROLOGIC: Cranial nerves II through XII are intact. Muscle strength 5/5 in all extremities. Sensation intact. Gait not checked.  PSYCHIATRIC: The patient is alert and oriented x 3.  SKIN: No obvious rash, lesion, or ulcer.   Physical Exam LABORATORY PANEL:   CBC  Recent Labs Lab 09/05/16 0314  WBC 5.9  HGB 12.0  HCT 35.9  PLT 229   ------------------------------------------------------------------------------------------------------------------  Chemistries   Recent Labs Lab 09/04/16 2029 09/05/16 0314  NA 140 140  K 4.3 3.8  CL 106 106  CO2 23 25  GLUCOSE 170* 122*  BUN 16 15  CREATININE 0.94 0.88  CALCIUM 9.1 8.8*  AST 23  --   ALT 18  --   ALKPHOS 70  --   BILITOT 0.4  --    ------------------------------------------------------------------------------------------------------------------  Cardiac Enzymes  Recent Labs Lab 09/05/16 0850 09/05/16 1454  TROPONINI 0.20* 0.20*   ------------------------------------------------------------------------------------------------------------------  RADIOLOGY:  Ct Chest Wo Contrast  Result Date: 09/07/2016 CLINICAL DATA:  Cough and chest pressure EXAM: CT CHEST WITHOUT CONTRAST TECHNIQUE: Multidetector CT imaging of the chest was performed following the standard protocol without IV contrast. COMPARISON:  Chest radiograph September 04, 2016 FINDINGS: Cardiovascular: There is no appreciable thoracic  aortic aneurysm. There are scattered foci of atherosclerotic calcification in the aorta as  well as scattered foci of coronary artery calcification. There is mild calcification at the origin of the right common carotid artery. The visualized great vessels appear unremarkable. Pericardium is marginally thickened without pericardial effusion evident. Pacemaker leads are attached to the right atrium and right ventricle. Mediastinum/Nodes: There is a slight amount of calcification in the left lobe of the thyroid. There is a 1.9 x 1.8 cm nodular area in the left lobe of the thyroid. There are several scattered subcentimeter mediastinal lymph nodes. By size criteria, no adenopathy is evident. Lungs/Pleura: There is airspace consolidation in portions of the lateral and posterior segments of the left lower lobe. Subtle infiltrate is noted in the posterior segment of the right lower lobe inferiorly. There is patchy atelectasis in the inferior lingula. There is lower lobe bronchiectatic change bilaterally. No pleural effusion or pleural thickening. Upper Abdomen: Scattered foci of atherosclerotic calcification noted in the visualized upper abdominal aorta. Gallbladder is absent. There is a small accessory spleen medial to the spleen inferiorly. Visualized upper abdominal structures otherwise unremarkable. Musculoskeletal: There are no blastic or lytic bone lesions. IMPRESSION: Areas of infiltrate consistent with pneumonia in the left lower lobe and to a much lesser extent in the right lower lobe. There is atelectatic change in the inferior lingula. Several subcentimeter mediastinal lymph nodes without adenopathy. **An incidental finding of potential clinical significance has been found. 1.9 x 1.8 cm nodular lesion left lobe of thyroid. Consider further evaluation with thyroid ultrasound. If patient is clinically hyperthyroid, consider nuclear medicine thyroid uptake and scan.** Scattered foci of atherosclerotic calcification including mild coronary artery calcification. Pacemaker leads attached to right atrium and  right ventricle. Mild pericardial thickening without pericardial effusion. Gallbladder absent. Electronically Signed   By: Lowella Grip III M.D.   On: 09/07/2016 13:24    ASSESSMENT AND PLAN:   Principal Problem:   Bronchitis Active Problems:   Demand ischemia (HCC)   HOCM (hypertrophic obstructive cardiomyopathy) ([place])  * NSTEMI- was suspected on admission but ruled out by 3 troponins negative. Remained stable on telemetry.   Appreciated cardiology consult, suggested to stop heparin IV drip.  * Acute bronchitis   This is mainly the reason for her cough and chest tightness.    oral azithromycin. Added cefuroxime.  given some bronchodilators with cough suppressants.   Influenza test is negative, no pneumonia on chest x-ray.   Added losenges for comfort of her throat.   She is on IV steroids.   Added inhaled steroids,. Get CT chest.   Encouraged to have sputum culture.  * History of HOCM   Continue aspirin, Lipitor, diuretics.  * Hypertension   Continue Coreg, Cozaar, verapamil.  * Diabetes  Lantus plus sliding scale coverage. Hold metformin.  Provide meal time coverage.   All the records are reviewed and case discussed with Care Management/Social Workerr. Management plans discussed with the patient, family and they are in agreement.  CODE STATUS: Full code  TOTAL TIME TAKING CARE OF THIS PATIENT: .35 minutes.     POSSIBLE D/C IN 1-2 DAYS, DEPENDING ON CLINICAL CONDITION.   Vaughan Basta M.D on 09/08/2016   Between 7am to 6pm - Pager - [phone]  After 6pm go to [email or web address] - password [email or web address] [place] [facility]  Office  [phone]  CC: Primary care physician; [facility]  Note: This dictation was prepared with Dragon dictation along with smaller phrase technology. Any transcriptional errors  that result from this process are unintentional.

## 2016-09-08 NOTE — Progress Notes (Signed)
[place] at [place] NAME: Marda Breidenbach    MR#:  [id number]  DATE OF BIRTH:  [birth year]  SUBJECTIVE:  CHIEF COMPLAINT:   Chief Complaint  Patient presents with  . Cough  . Shortness of Breath   came with cough and shortness of breath. Noted to have slightly elevated troponin with some complaint of chest tightness so given as admission for non-ST elevation MI after starting on heparin IV drip. Her troponin remained stable so cardiologist DC'd heparin IV drip. Patient still her complain of cough with shortness of breath.  she feels worse today with lot of wheezing and cough. Also c/o throat pain.   inspite of getting IV steroids, nebs and Abx for 2 days- she did not improve-   CT chest shows pneumonia.  REVIEW OF SYSTEMS:  CONSTITUTIONAL: No fever, fatigue or weakness.  EYES: No blurred or double vision.  EARS, NOSE, AND THROAT: No tinnitus or ear pain.  RESPIRATORY: Positive for cough, shortness of breath, some wheezing , no hemoptysis.  CARDIOVASCULAR: No chest pain, orthopnea, edema.  GASTROINTESTINAL: No nausea, vomiting, diarrhea or abdominal pain.  GENITOURINARY: No dysuria, hematuria.  ENDOCRINE: No polyuria, nocturia,  HEMATOLOGY: No anemia, easy bruising or bleeding SKIN: No rash or lesion. MUSCULOSKELETAL: No joint pain or arthritis.   NEUROLOGIC: No tingling, numbness, weakness.  PSYCHIATRY: No anxiety or depression.   ROS  DRUG ALLERGIES:  No Known Allergies  VITALS:  Blood pressure (!) 146/67, pulse 66, temperature 97.8 F (36.6 C), temperature source Oral, resp. rate 15, height 5\' 4"  (1.626 m), weight 104.5 kg (230 lb 6.4 oz), SpO2 99 %.  PHYSICAL EXAMINATION:  GENERAL:  56 y.o.-year-old patient lying in the bed with no acute distress.  EYES: Pupils equal, round, reactive to light and accommodation. No scleral icterus. Extraocular muscles intact.  HEENT: Head atraumatic, normocephalic. Oropharynx and nasopharynx  clear.  NECK:  Supple, no jugular venous distention. No thyroid enlargement, no tenderness.  LUNGS: Normal breath sounds bilaterally, Mild wheezing, no crepitation. No use of accessory muscles of respiration.  CARDIOVASCULAR: S1, S2 normal. No murmurs, rubs, or gallops.  ABDOMEN: Soft, nontender, nondistended. Bowel sounds present. No organomegaly or mass.  EXTREMITIES: No pedal edema, cyanosis, or clubbing.  NEUROLOGIC: Cranial nerves II through XII are intact. Muscle strength 5/5 in all extremities. Sensation intact. Gait not checked.  PSYCHIATRIC: The patient is alert and oriented x 3.  SKIN: No obvious rash, lesion, or ulcer.   Physical Exam LABORATORY PANEL:   CBC  Recent Labs Lab 09/08/16 0857  WBC 15.9*  HGB 12.0  HCT 36.8  PLT 252   ------------------------------------------------------------------------------------------------------------------  Chemistries   Recent Labs Lab 09/04/16 2029  09/08/16 0857  NA 140  < > 133*  K 4.3  < > 4.3  CL 106  < > 96*  CO2 23  < > 30  GLUCOSE 170*  < > 290*  BUN 16  < > 41*  CREATININE 0.94  < > 0.95  CALCIUM 9.1  < > 9.1  AST 23  --   --   ALT 18  --   --   ALKPHOS 70  --   --   BILITOT 0.4  --   --   < > = values in this interval not displayed. ------------------------------------------------------------------------------------------------------------------  Cardiac Enzymes  Recent Labs Lab 09/05/16 0850 09/05/16 1454  TROPONINI 0.20* 0.20*   ------------------------------------------------------------------------------------------------------------------  RADIOLOGY:  Ct Chest Wo Contrast  Result Date: 09/07/2016 CLINICAL  DATA:  Cough and chest pressure EXAM: CT CHEST WITHOUT CONTRAST TECHNIQUE: Multidetector CT imaging of the chest was performed following the standard protocol without IV contrast. COMPARISON:  Chest radiograph September 04, 2016 FINDINGS: Cardiovascular: There is no appreciable thoracic aortic  aneurysm. There are scattered foci of atherosclerotic calcification in the aorta as well as scattered foci of coronary artery calcification. There is mild calcification at the origin of the right common carotid artery. The visualized great vessels appear unremarkable. Pericardium is marginally thickened without pericardial effusion evident. Pacemaker leads are attached to the right atrium and right ventricle. Mediastinum/Nodes: There is a slight amount of calcification in the left lobe of the thyroid. There is a 1.9 x 1.8 cm nodular area in the left lobe of the thyroid. There are several scattered subcentimeter mediastinal lymph nodes. By size criteria, no adenopathy is evident. Lungs/Pleura: There is airspace consolidation in portions of the lateral and posterior segments of the left lower lobe. Subtle infiltrate is noted in the posterior segment of the right lower lobe inferiorly. There is patchy atelectasis in the inferior lingula. There is lower lobe bronchiectatic change bilaterally. No pleural effusion or pleural thickening. Upper Abdomen: Scattered foci of atherosclerotic calcification noted in the visualized upper abdominal aorta. Gallbladder is absent. There is a small accessory spleen medial to the spleen inferiorly. Visualized upper abdominal structures otherwise unremarkable. Musculoskeletal: There are no blastic or lytic bone lesions. IMPRESSION: Areas of infiltrate consistent with pneumonia in the left lower lobe and to a much lesser extent in the right lower lobe. There is atelectatic change in the inferior lingula. Several subcentimeter mediastinal lymph nodes without adenopathy. **An incidental finding of potential clinical significance has been found. 1.9 x 1.8 cm nodular lesion left lobe of thyroid. Consider further evaluation with thyroid ultrasound. If patient is clinically hyperthyroid, consider nuclear medicine thyroid uptake and scan.** Scattered foci of atherosclerotic calcification  including mild coronary artery calcification. Pacemaker leads attached to right atrium and right ventricle. Mild pericardial thickening without pericardial effusion. Gallbladder absent. Electronically Signed   By: Lowella Grip III M.D.   On: 09/07/2016 13:24    ASSESSMENT AND PLAN:   Principal Problem:   Bronchitis Active Problems:   Demand ischemia (HCC)   HOCM (hypertrophic obstructive cardiomyopathy) ([place])  * NSTEMI- was suspected on admission but ruled out by 3 troponins negative. Remained stable on telemetry.   Appreciated cardiology consult, suggested to stop heparin IV drip.  * Acute bronchitis, pneumonia   oral azithromycin. Added cefuroxime.  given some bronchodilators with cough suppressants.   Influenza test is negative, no pneumonia on chest x-ray.   Added losenges for comfort of her throat.   She is on IV steroids.   Added inhaled steroids,.    Encouraged to have sputum culture.   On CT chest- Pneumonia.   Talked to Dr. Mortimer Fries- and he suggested to change cefuroxime to IV rocephin.   Give one dose iV lasix.  * History of HOCM   Continue aspirin, Lipitor, diuretics.  * Hypertension   Continue Coreg, Cozaar, verapamil.  * Diabetes  Lantus plus sliding scale coverage. Hold metformin.  Provide meal time coverage.   All the records are reviewed and case discussed with Care Management/Social Workerr. Management plans discussed with the patient, family and they are in agreement.  CODE STATUS: Full code  TOTAL TIME TAKING CARE OF THIS PATIENT: .35 minutes.   Discussed with Dr. Mortimer Fries.  POSSIBLE D/C IN 1-2 DAYS, DEPENDING ON CLINICAL CONDITION.   Aviyana Sonntag,  Rosalio Macadamia M.D on 09/08/2016   Between 7am to 6pm - Pager - [phone]  After 6pm go to [email or web address] - password [email or web address] [place] [facility]  Office  [phone]  CC: Primary care physician; [facility]  Note: This dictation was prepared with Dragon dictation along with  smaller phrase technology. Any transcriptional errors that result from this process are unintentional.

## 2016-09-08 NOTE — Progress Notes (Signed)
MD notified. Pts CBG is 399. Orders to give 20units. I will continue to assess.

## 2016-09-09 ENCOUNTER — Inpatient Hospital Stay: Payer: BC Managed Care – PPO

## 2016-09-09 DIAGNOSIS — R05 Cough: Secondary | ICD-10-CM

## 2016-09-09 DIAGNOSIS — J9801 Acute bronchospasm: Secondary | ICD-10-CM

## 2016-09-09 DIAGNOSIS — J45901 Unspecified asthma with (acute) exacerbation: Secondary | ICD-10-CM

## 2016-09-09 LAB — GLUCOSE, CAPILLARY
Glucose-Capillary: 198 mg/dL — ABNORMAL HIGH (ref 65–99)
Glucose-Capillary: 424 mg/dL — ABNORMAL HIGH (ref 65–99)
Glucose-Capillary: 359 mg/dL — ABNORMAL HIGH (ref 65–99)
Glucose-Capillary: 281 mg/dL — ABNORMAL HIGH (ref 65–99)

## 2016-09-09 MED ORDER — LEVALBUTEROL HCL 0.63 MG/3ML IN NEBU
0.6300 mg | INHALATION_SOLUTION | Freq: Four times a day (QID) | RESPIRATORY_TRACT | Status: DC
  Administered 2016-09-09 – 2016-09-11 (×7): 0.63 mg via RESPIRATORY_TRACT
  Filled 2016-09-09 (×6): qty 3

## 2016-09-09 MED ORDER — LEVALBUTEROL HCL 0.63 MG/3ML IN NEBU: 0.6300 mg | INHALATION_SOLUTION | Freq: Four times a day (QID) | RESPIRATORY_TRACT | Status: DC

## 2016-09-09 MED ORDER — INSULIN ASPART 100 UNIT/ML ~~LOC~~ SOLN
15.0000 [IU] | Freq: Three times a day (TID) | SUBCUTANEOUS | Status: DC
  Administered 2016-09-09 – 2016-09-11 (×5): 15 [IU] via SUBCUTANEOUS
  Filled 2016-09-09 (×5): qty 15

## 2016-09-09 MED ORDER — LEVALBUTEROL HCL 0.63 MG/3ML IN NEBU
0.6300 mg | INHALATION_SOLUTION | RESPIRATORY_TRACT | Status: DC | PRN
  Filled 2016-09-09: qty 3

## 2016-09-09 MED ORDER — METOPROLOL TARTRATE 50 MG PO TABS
75.0000 mg | ORAL_TABLET | Freq: Two times a day (BID) | ORAL | Status: DC
  Administered 2016-09-09 – 2016-09-11 (×3): 75 mg via ORAL
  Filled 2016-09-09 (×5): qty 1

## 2016-09-09 NOTE — Plan of Care (Signed)
Problem: Pain Managment: Goal: General experience of comfort will improve Outcome: Not Progressing Prn medications  Problem: Tissue Perfusion: Goal: Risk factors for ineffective tissue perfusion will decrease Outcome: Progressing SQ Lovenox  Problem: Activity: Goal: Risk for activity intolerance will decrease Outcome: Not Progressing Remains dyspneic on exertion  Problem: Phase I Progression Outcomes Goal: O2 sats > or equal 90% or at baseline On 2LO2 per [place]

## 2016-09-09 NOTE — Progress Notes (Signed)
Inpatient Diabetes Program Recommendations  AACE/ADA: New Consensus Statement on Inpatient Glycemic Control (2015)  Target Ranges:  Prepandial:   less than 140 mg/dL      Peak postprandial:   less than 180 mg/dL (1-2 hours)      Critically ill patients:  140 - 180 mg/dL   Results for KNOX, HOLDMAN (MRN [id number]) as of 09/09/2016 10:11  Ref. Range 09/08/2016 07:53 09/08/2016 11:38 09/08/2016 12:11 09/08/2016 16:59 09/08/2016 21:06 09/09/2016 07:42  Glucose-Capillary Latest Ref Range: 65 - 99 mg/dL 292 (H) 418 (H) 399 (H) 321 (H) 341 (H) 198 (H)   Review of Glycemic Control  Diabetes history: DM2 Outpatient Diabetes medications: Lantus 40 units QHS, Novolog 12 units TID with meals, Metformin 850 mg TID Current orders for Inpatient glycemic control: Lantus 45 units QHS, Novolog 0-20 units TID with meals, Novolog 0-5 units QHS, Novolog 10 units TID with meals for meal coverage  Inpatient Diabetes Program Recommendations: Insulin - Meal Coverage: If steroids are continued,  please consider increasing meal coverage to Novolog 15 units TID with meals.  Thanks, Barnie Alderman, RN, MSN, CDE Diabetes Coordinator Inpatient Diabetes Program [phone] (Team Pager from 8am to 5pm)

## 2016-09-09 NOTE — Progress Notes (Signed)
FSBS [id number], Dr. Anselm Jungling made aware, no new orders received.

## 2016-09-09 NOTE — Progress Notes (Signed)
[place] at [place] NAME: Julie Bender    MR#:  [id number]  DATE OF BIRTH:  1961-01-18  SUBJECTIVE:  CHIEF COMPLAINT:   Chief Complaint  Patient presents with  . Cough  . Shortness of Breath   came with cough and shortness of breath. Noted to have slightly elevated troponin with some complaint of chest tightness so given as admission for non-ST elevation MI after starting on heparin IV drip. Her troponin remained stable so cardiologist DC'd heparin IV drip. Patient still her complain of cough with shortness of breath.  she feels worse today with lot of wheezing and cough. Also c/o throat pain.   inspite of getting IV steroids, nebs and Abx for 3 days- she did not improve-   CT chest shows pneumonia.   She was recently admitted at [facility] for similar symptoms and discharged home and this is admission again over here.  REVIEW OF SYSTEMS:  CONSTITUTIONAL: No fever, fatigue or weakness.  EYES: No blurred or double vision.  EARS, NOSE, AND THROAT: No tinnitus or ear pain.  RESPIRATORY: Positive for cough, shortness of breath, some wheezing , no hemoptysis.  CARDIOVASCULAR: No chest pain, orthopnea, edema.  GASTROINTESTINAL: No nausea, vomiting, diarrhea or abdominal pain.  GENITOURINARY: No dysuria, hematuria.  ENDOCRINE: No polyuria, nocturia,  HEMATOLOGY: No anemia, easy bruising or bleeding SKIN: No rash or lesion. MUSCULOSKELETAL: No joint pain or arthritis.   NEUROLOGIC: No tingling, numbness, weakness.  PSYCHIATRY: No anxiety or depression.   ROS  DRUG ALLERGIES:  No Known Allergies  VITALS:  Blood pressure 134/71, pulse (!) 57, temperature 97.6 F (36.4 C), temperature source Oral, resp. rate 12, height 5\' 4"  (1.626 m), weight 104 kg (229 lb 4.8 oz), SpO2 99 %.  PHYSICAL EXAMINATION:  GENERAL:  56 y.o.-year-old patient lying in the bed with no acute distress.  EYES: Pupils equal, round, reactive to light and accommodation. No  scleral icterus. Extraocular muscles intact.  HEENT: Head atraumatic, normocephalic. Oropharynx and nasopharynx clear.  NECK:  Supple, no jugular venous distention. No thyroid enlargement, no tenderness.  LUNGS: Normal breath sounds bilaterally, Mild wheezing, no crepitation. No use of accessory muscles of respiration.  CARDIOVASCULAR: S1, S2 normal. No murmurs, rubs, or gallops.  ABDOMEN: Soft, nontender, nondistended. Bowel sounds present. No organomegaly or mass.  EXTREMITIES: No pedal edema, cyanosis, or clubbing.  NEUROLOGIC: Cranial nerves II through XII are intact. Muscle strength 5/5 in all extremities. Sensation intact. Gait not checked.  PSYCHIATRIC: The patient is alert and oriented x 3.  SKIN: No obvious rash, lesion, or ulcer.   Physical Exam LABORATORY PANEL:   CBC  Recent Labs Lab 09/08/16 0857  WBC 15.9*  HGB 12.0  HCT 36.8  PLT 252   ------------------------------------------------------------------------------------------------------------------  Chemistries   Recent Labs Lab 09/04/16 2029  09/08/16 0857  NA 140  < > 133*  K 4.3  < > 4.3  CL 106  < > 96*  CO2 23  < > 30  GLUCOSE 170*  < > 290*  BUN 16  < > 41*  CREATININE 0.94  < > 0.95  CALCIUM 9.1  < > 9.1  AST 23  --   --   ALT 18  --   --   ALKPHOS 70  --   --   BILITOT 0.4  --   --   < > = values in this interval not displayed. ------------------------------------------------------------------------------------------------------------------  Cardiac Enzymes  Recent Labs Lab 09/05/16 [id number]  09/05/16 1454  TROPONINI 0.20* 0.20*   ------------------------------------------------------------------------------------------------------------------  RADIOLOGY:  No results found.  ASSESSMENT AND PLAN:   Principal Problem:   Bronchitis Active Problems:   Demand ischemia (HCC)   HOCM (hypertrophic obstructive cardiomyopathy) ([place])  * NSTEMI- was suspected on admission but ruled out by 3  troponins negative. Remained stable on telemetry.   Appreciated cardiology consult, suggested to stop heparin IV drip.  * Acute bronchitis, pneumonia   oral azithromycin. Added cefuroxime.  given some bronchodilators with cough suppressants.   Influenza test is negative, no pneumonia on chest x-ray.   Added losenges for comfort of her throat.   She is on IV steroids.   Added inhaled steroids,.    Encouraged to have sputum culture.   On CT chest- Pneumonia.   Talked to Dr. Mortimer Fries- and he suggested to change cefuroxime to IV rocephin.   Give one dose iV lasix.   Appreciated consult by Dr. Jamal Collin.   She need sleep study as out pt.  * History of HOCM   Continue aspirin, Lipitor, diuretics.  * Hypertension   Continue Coreg, Cozaar, verapamil.  * Diabetes  Lantus plus sliding scale coverage. Hold metformin.  Provide meal time coverage.   All the records are reviewed and case discussed with Care Management/Social Workerr. Management plans discussed with the patient, family and they are in agreement.  CODE STATUS: Full code  TOTAL TIME TAKING CARE OF THIS PATIENT: .35 minutes.   Discussed with Dr. Mortimer Fries.  POSSIBLE D/C IN 1-2 DAYS, DEPENDING ON CLINICAL CONDITION.   Vaughan Basta M.D on 09/09/2016   Between 7am to 6pm - Pager - [phone]  After 6pm go to [email or web address] - password [email or web address] [facility]  Office  [phone]  CC: Primary care physician; [facility]  Note: This dictation was prepared with Dragon dictation along with smaller phrase technology. Any transcriptional errors that result from this process are unintentional.

## 2016-09-09 NOTE — Consult Note (Signed)
PULMONARY CONSULT NOTE  Requesting MD/Service: [facility] Date of initial consultation: 09/09/16 Reason for consultation: intractable cough, increased SOB  PT PROFILE: 62 F with history of severe HOCM, dCHF, DM recently discharged from [facility] after brief hospitalization for increased SOB and mild cough. Readmitted 09/04/16 to [facility] with worsening cough and mildly worsening SOB.   HPI:  38 F with history of severe HOCM, dCHF, DM recently discharged from [facility] after brief hospitalization for increased SOB and mild cough. She was treated as CHF exacerbation with diuresis and felt somewhat better by the time of discharge. However, she was readmitted 09/04/16 to [facility] with worsening cough and mildly worsening SOB. She was seen by Cardiology who did not think that the her worsened symptoms were due to worsening CHF. She has been treated as asthmatic bronchitis with minimal improvement in her symptoms. She still has intractable non-productive cough, chest pain due to coughing. Denies fever, purulent sputum, hemoptysis, LE edema and calf tenderness. She is frustrated with the lack of improvement and with the sleep disruption due to cough.   Of note, she has excessive daytime sleepiness @ her baseline and is told by her husband that she snores. She was treated with CPAP for the two nights that she was in [facility] but there have been no plans made for further evaluation of possible (likely) OSA.    Past Medical History:  Diagnosis Date  . CHF (congestive heart failure) ([place])   . Diabetes mellitus without complication ([place])   . Hypertension     History reviewed. No pertinent surgical history.  MEDICATIONS: I have reviewed all medications and confirmed regimen as documented  Social History   Social History  . Marital status: Married    Spouse name: N/A  . Number of children: N/A  . Years of education: N/A   Occupational History  . Not on file.   Social History Main Topics  .  Smoking status: Passive Smoke Exposure - Never Smoker  . Smokeless tobacco: Never Used  . Alcohol use No  . Drug use: Unknown  . Sexual activity: Not on file   Other Topics Concern  . Not on file   Social History Narrative  . No narrative on file    History reviewed. No pertinent family history.  ROS: No fever, myalgias/arthralgias, unexplained weight loss or weight gain No new focal weakness or sensory deficits No otalgia, hearing loss, visual changes, nasal and sinus symptoms, mouth and throat problems No neck pain or adenopathy No abdominal pain, N/V/D, diarrhea, change in bowel pattern No dysuria, change in urinary pattern   Vitals:   09/09/16 0754 09/09/16 0808 09/09/16 1127 09/09/16 1135  BP: (!) 148/73  134/71   Pulse: (!) 55  (!) 57   Resp: 17  12   Temp: 97.4 F (36.3 C)  97.6 F (36.4 C)   TempSrc: Oral  Oral   SpO2: 98% 98% 99% 99%  Weight:      Height:         EXAM:  Gen: Obese, fatigued appearing, No overt respiratory distress while @ rest HEENT: NCAT, sclera white, oropharynx normal Neck: Supple without LAN, thyromegaly, JVP poorly visualized Lungs: breath sounds mildly diminished with scattered distant wheezes, no other adventitious sounds Cardiovascular: Reg, III/VI systolic M Abdomen: Soft, nontender, normal BS Ext: without clubbing, cyanosis, edema Neuro: CNs grossly intact, motor and sensory intact Skin: Limited exam, no lesions noted  DATA:   BMP Latest Ref Rng & Units 09/08/2016 09/05/2016 09/04/2016  Glucose 65 - 99 mg/dL 290(H) 122(H) 170(H)  BUN 6 - 20 mg/dL 41(H) 15 16  Creatinine 0.44 - 1.00 mg/dL 0.95 0.88 0.94  Sodium 135 - 145 mmol/L 133(L) 140 140  Potassium 3.5 - 5.1 mmol/L 4.3 3.8 4.3  Chloride 101 - 111 mmol/L 96(L) 106 106  CO2 22 - 32 mmol/L 30 25 23   Calcium 8.9 - 10.3 mg/dL 9.1 8.8(L) 9.1    CBC Latest Ref Rng & Units 09/08/2016 09/05/2016 09/04/2016  WBC 3.6 - 11.0 K/uL 15.9(H) 5.9 5.5  Hemoglobin 12.0 - 16.0 g/dL 12.0  12.0 12.3  Hematocrit 35.0 - 47.0 % 36.8 35.9 37.0  Platelets 150 - 440 K/uL 252 229 216    CXR:  CM, no edema, no acute infiltrates CT chest (09/07/16): few patchy areas of atx vs infiltrate in BLL and lingula  IMPRESSION:   1) Acute asthmatic bronchitis 2) intractable cough - nonproductive 3) Likely OSA 4) Severe HOCM  PLAN:  1) Cont supplemental O2 2) Cont systemic steroids @ current dose 3) Cont nebulized steroids 4) Cont empiric antibiotics - ceftriaxone and azithromycin 5) Continue scheduled cough suppression 6) Bronchodilator regimen adjusted - change to levalbuterol and DC ipratropium 7) Change carvedilol to cardioselective beta blocker (metoprolol) as nonselective beta blockers might exacerbate bronchospasm 8) Recheck CXR to R/O pulmonary edema 9) After discharge, she should undergo sleep study  - she indicates that she would prefer that this be done @ [place], MD PCCM service Mobile [phone] Pager [phone] 09/09/2016

## 2016-09-10 LAB — GLUCOSE, CAPILLARY
GLUCOSE-CAPILLARY: 201 mg/dL — AB (ref 65–99)
GLUCOSE-CAPILLARY: 281 mg/dL — AB (ref 65–99)
Glucose-Capillary: 272 mg/dL — ABNORMAL HIGH (ref 65–99)
Glucose-Capillary: 181 mg/dL — ABNORMAL HIGH (ref 65–99)

## 2016-09-10 MED ORDER — PREDNISONE 20 MG PO TABS
40.0000 mg | ORAL_TABLET | Freq: Every day | ORAL | Status: DC
Start: 2016-09-13 — End: 2016-09-11

## 2016-09-10 MED ORDER — PREDNISONE 20 MG PO TABS
30.0000 mg | ORAL_TABLET | Freq: Every day | ORAL | Status: DC
Start: 2016-09-14 — End: 2016-09-11

## 2016-09-10 MED ORDER — PREDNISONE 50 MG PO TABS
60.0000 mg | ORAL_TABLET | Freq: Every day | ORAL | Status: AC
  Administered 2016-09-11: 09:00:00 60 mg via ORAL
  Filled 2016-09-10: qty 1

## 2016-09-10 MED ORDER — PREDNISONE 50 MG PO TABS: 50.0000 mg | ORAL_TABLET | Freq: Every day | ORAL | Status: DC

## 2016-09-10 MED ORDER — PREDNISONE 10 MG PO TABS: 10.0000 mg | ORAL_TABLET | Freq: Every day | ORAL | Status: DC

## 2016-09-10 MED ORDER — PREDNISONE 20 MG PO TABS
20.0000 mg | ORAL_TABLET | Freq: Every day | ORAL | Status: DC
Start: 2016-09-15 — End: 2016-09-11

## 2016-09-10 NOTE — Progress Notes (Signed)
[place] at [place] NAME: Julie Bender    MR#:  [id number]  DATE OF BIRTH:  09-21-1960  SUBJECTIVE:  CHIEF COMPLAINT:   Chief Complaint  Patient presents with  . Cough  . Shortness of Breath   came with cough and shortness of breath. Noted to have slightly elevated troponin with some complaint of chest tightness so given as admission for non-ST elevation MI after starting on heparin IV drip. Her troponin remained stable so cardiologist DC'd heparin IV drip. Patient still her complain of cough with shortness of breath.  she feels worse today with lot of wheezing and cough. Also c/o throat pain.   inspite of getting IV steroids, nebs and Abx for 3 days- she did not improve-   CT chest shows pneumonia.   She was recently admitted at [facility] for similar symptoms and discharged home and this is admission again over here.   Today off oxygen , but feels very SOB with minimal exertion.   REVIEW OF SYSTEMS:  CONSTITUTIONAL: No fever, fatigue or weakness.  EYES: No blurred or double vision.  EARS, NOSE, AND THROAT: No tinnitus or ear pain.  RESPIRATORY: Positive for cough, shortness of breath, some wheezing , no hemoptysis.  CARDIOVASCULAR: No chest pain, orthopnea, edema.  GASTROINTESTINAL: No nausea, vomiting, diarrhea or abdominal pain.  GENITOURINARY: No dysuria, hematuria.  ENDOCRINE: No polyuria, nocturia,  HEMATOLOGY: No anemia, easy bruising or bleeding SKIN: No rash or lesion. MUSCULOSKELETAL: No joint pain or arthritis.   NEUROLOGIC: No tingling, numbness, weakness.  PSYCHIATRY: No anxiety or depression.   ROS  DRUG ALLERGIES:  No Known Allergies  VITALS:  Blood pressure (!) 142/63, pulse 76, temperature 98.3 F (36.8 C), temperature source Oral, resp. rate 16, height 5\' 4"  (1.626 m), weight 105.6 kg (232 lb 12.8 oz), SpO2 93 %.  PHYSICAL EXAMINATION:  GENERAL:  56 y.o.-year-old patient lying in the bed with no acute distress.   EYES: Pupils equal, round, reactive to light and accommodation. No scleral icterus. Extraocular muscles intact.  HEENT: Head atraumatic, normocephalic. Oropharynx and nasopharynx clear.  NECK:  Supple, no jugular venous distention. No thyroid enlargement, no tenderness.  LUNGS: Normal breath sounds bilaterally, Mild wheezing, no crepitation. No use of accessory muscles of respiration.  CARDIOVASCULAR: S1, S2 normal. No murmurs, rubs, or gallops.  ABDOMEN: Soft, nontender, nondistended. Bowel sounds present. No organomegaly or mass.  EXTREMITIES: No pedal edema, cyanosis, or clubbing.  NEUROLOGIC: Cranial nerves II through XII are intact. Muscle strength 5/5 in all extremities. Sensation intact. Gait not checked.  PSYCHIATRIC: The patient is alert and oriented x 3.  SKIN: No obvious rash, lesion, or ulcer.   Physical Exam LABORATORY PANEL:   CBC  Recent Labs Lab 09/08/16 0857  WBC 15.9*  HGB 12.0  HCT 36.8  PLT 252   ------------------------------------------------------------------------------------------------------------------  Chemistries   Recent Labs Lab 09/04/16 2029  09/08/16 0857  NA 140  < > 133*  K 4.3  < > 4.3  CL 106  < > 96*  CO2 23  < > 30  GLUCOSE 170*  < > 290*  BUN 16  < > 41*  CREATININE 0.94  < > 0.95  CALCIUM 9.1  < > 9.1  AST 23  --   --   ALT 18  --   --   ALKPHOS 70  --   --   BILITOT 0.4  --   --   < > = values in  this interval not displayed. ------------------------------------------------------------------------------------------------------------------  Cardiac Enzymes  Recent Labs Lab 09/05/16 0850 09/05/16 1454  TROPONINI 0.20* 0.20*   ------------------------------------------------------------------------------------------------------------------  RADIOLOGY:  Dg Chest 2 View  Result Date: 09/09/2016 CLINICAL DATA:  Acute onset of shortness of breath and cough. Initial encounter. EXAM: CHEST  2 VIEW COMPARISON:  Chest  radiograph performed 09/04/2016, and CT of the chest performed 09/07/2016 FINDINGS: The lungs are well-aerated. Mild vascular congestion is noted. There is no evidence of pleural effusion or pneumothorax. The heart is borderline enlarged. A pacemaker/AICD is noted at the left chest wall, with leads ending at the right atrium and right ventricle. No acute osseous abnormalities are seen. Clips are noted within the right upper quadrant, reflecting prior cholecystectomy. IMPRESSION: Mild vascular congestion and borderline cardiomegaly. Lungs remain grossly clear. Electronically Signed   By: Garald Balding M.D.   On: 09/09/2016 19:24    ASSESSMENT AND PLAN:   Principal Problem:   Bronchitis Active Problems:   Demand ischemia (HCC)   HOCM (hypertrophic obstructive cardiomyopathy) ([place])  * NSTEMI- was suspected on admission but ruled out by 3 troponins negative. Remained stable on telemetry.   Appreciated cardiology consult, suggested to stop heparin IV drip.  * Acute bronchitis, pneumonia   oral azithromycin. Added cefuroxime.  given some bronchodilators with cough suppressants.   Influenza test is negative, no pneumonia on chest x-ray.   Added losenges for comfort of her throat.   She is on IV steroids.   Added inhaled steroids,.    Encouraged to have sputum culture.   On CT chest- Pneumonia.   Talked to Dr. Mortimer Fries- and he suggested to change cefuroxime to IV rocephin.   Give one dose iV lasix.   Appreciated consult by Dr. Jamal Collin.   She need sleep study as out pt.  * History of HOCM   Continue aspirin, Lipitor, diuretics.  * Hypertension   Continue Coreg, Cozaar, verapamil.  * Diabetes  Lantus plus sliding scale coverage. Hold metformin.  Provide meal time coverage.   All the records are reviewed and case discussed with Care Management/Social Workerr. Management plans discussed with the patient, family and they are in agreement.  CODE STATUS: Full code  TOTAL TIME TAKING CARE OF  THIS PATIENT: .35 minutes.   Discussed with Dr. Mortimer Fries., now on room air- may d/c tomorrow.  POSSIBLE D/C IN 1-2 DAYS, DEPENDING ON CLINICAL CONDITION.   Vaughan Basta M.D on 09/10/2016   Between 7am to 6pm - Pager - [phone]  After 6pm go to [email or web address] - password [email or web address] [facility]  Office  [phone]  CC: Primary care physician; [facility]  Note: This dictation was prepared with Dragon dictation along with smaller phrase technology. Any transcriptional errors that result from this process are unintentional.

## 2016-09-10 NOTE — Care Management (Signed)
Asked attending whether patient may need a home nebulizer and neb treatments. Patient does confirm she does not have a home nebulizer.

## 2016-09-11 LAB — CREATININE, SERUM
CREATININE: 0.78 mg/dL (ref 0.44–1.00)
GFR calc Af Amer: >60 mL/min (ref >60)
GFR calc non Af Amer: >60 mL/min (ref >60)

## 2016-09-11 LAB — GLUCOSE, CAPILLARY: GLUCOSE-CAPILLARY: 146 mg/dL — AB (ref 65–99)

## 2016-09-11 MED ORDER — PHENOL 1.4 % MT LIQD
1.0000 | OROMUCOSAL | Status: DC | PRN
  Administered 2016-09-11: 1 via OROMUCOSAL
  Filled 2016-09-11: qty 177

## 2016-09-11 MED ORDER — AMOXICILLIN-POT CLAVULANATE 875-125 MG PO TABS
1.0000 | ORAL_TABLET | Freq: Two times a day (BID) | ORAL | Status: DC
  Administered 2016-09-11: 1 via ORAL
  Filled 2016-09-11: qty 1

## 2016-09-11 MED ORDER — ALBUTEROL SULFATE HFA 108 (90 BASE) MCG/ACT IN AERS: 2.0000 | INHALATION_SPRAY | Freq: Four times a day (QID) | RESPIRATORY_TRACT | 2 refills | Status: DC | PRN

## 2016-09-11 MED ORDER — PREDNISONE 10 MG PO TABS: ORAL_TABLET | ORAL | 0 refills | Status: DC

## 2016-09-11 MED ORDER — METOPROLOL TARTRATE 75 MG PO TABS: 75.0000 mg | ORAL_TABLET | Freq: Two times a day (BID) | ORAL | 1 refills | Status: DC

## 2016-09-11 MED ORDER — AMOXICILLIN-POT CLAVULANATE 875-125 MG PO TABS: 1.0000 | ORAL_TABLET | Freq: Two times a day (BID) | ORAL | 0 refills | Status: DC

## 2016-09-11 MED ORDER — BENZONATATE 100 MG PO CAPS: 100.0000 mg | ORAL_CAPSULE | Freq: Three times a day (TID) | ORAL | 0 refills | Status: DC | PRN

## 2016-09-11 MED ORDER — PNEUMOCOCCAL 13-VAL CONJ VACC IM SUSP
0.5000 mL | Freq: Once | INTRAMUSCULAR | Status: AC
  Administered 2016-09-11: 0.5 mL via INTRAMUSCULAR
  Filled 2016-09-11 (×2): qty 0.5

## 2016-09-11 NOTE — Discharge Summary (Signed)
[facility] NAME: Julie Bender    MR#:  [id number]  DATE OF BIRTH:  1960/10/01  DATE OF ADMISSION:  09/04/2016   ADMITTING PHYSICIAN: Ubaldo Glassing Hugelmeyer, DO  DATE OF DISCHARGE: 09/11/2016 12:30 PM  PRIMARY CARE PHYSICIAN: [facility]   ADMISSION DIAGNOSIS:  NSTEMI (non-ST elevated myocardial infarction) ([place]) [I21.4] DISCHARGE DIAGNOSIS:  Principal Problem:   Bronchitis Active Problems:   Demand ischemia (HCC)   HOCM (hypertrophic obstructive cardiomyopathy) (HCC) Acute bronchitis, pneumonia SECONDARY DIAGNOSIS:   Past Medical History:  Diagnosis Date  . CHF (congestive heart failure) ([place])   . Diabetes mellitus without complication ([place])   . Hypertension    HOSPITAL COURSE:   * NSTEMI- was suspected on admission but ruled out by 3 troponins negative. Remained stable on telemetry.   Appreciated cardiology consult, suggested to stop heparin IV drip.  * Acute bronchitis, pneumonia   oral azithromycin. Added cefuroxime.  given some bronchodilators with cough suppressants.   Influenza test is negative, no pneumonia on chest x-ray.   Added losenges for comfort of her throat.   She was on IV steroids. Change to by mouth prednisone taper.   Added inhaled steroids,.    Encouraged to have sputum culture.   On CT chest- Pneumonia.   Talked to Dr. Mortimer Fries- and he suggested to change cefuroxime to IV rocephin. Changed to Augmentin po.   She need sleep study as out pt.  * History of HOCM   Continue aspirin, Lipitor, diuretics.  * Hypertension   Continue Coreg, Cozaar, verapamil.  * Diabetes  Lantus plus sliding scale coverage. Hold metformin.  Provide meal time coverage.  DISCHARGE CONDITIONS:  Stable, discharged to home today. CONSULTS OBTAINED:   DRUG ALLERGIES:  No Known Allergies DISCHARGE MEDICATIONS:   Allergies as of 09/11/2016   No Known Allergies     Medication List    STOP taking  these medications   carvedilol 25 MG tablet Commonly known as:  COREG     TAKE these medications   albuterol 108 (90 Base) MCG/ACT inhaler Commonly known as:  PROVENTIL HFA;VENTOLIN HFA Inhale 2 puffs into the lungs every 6 (six) hours as needed for wheezing or shortness of breath.   amoxicillin-clavulanate 875-125 MG tablet Commonly known as:  AUGMENTIN Take 1 tablet by mouth every 12 (twelve) hours.   aspirin EC 81 MG tablet Take 81 mg by mouth daily.   atorvastatin 20 MG tablet Commonly known as:  LIPITOR Take 20 mg by mouth daily.   benzonatate 100 MG capsule Commonly known as:  TESSALON Take 1 capsule (100 mg total) by mouth 3 (three) times daily as needed for cough.   bumetanide 1 MG tablet Commonly known as:  BUMEX Take 1 mg by mouth 2 (two) times daily.   citalopram 20 MG tablet Commonly known as:  CELEXA Take 20 mg by mouth daily.   LANTUS 100 UNIT/ML injection Generic drug:  insulin glargine Inject 40 Units into the skin at bedtime.   losartan 100 MG tablet Commonly known as:  COZAAR Take 100 mg by mouth daily.   metFORMIN 850 MG tablet Commonly known as:  GLUCOPHAGE Take 850 mg by mouth 3 (three) times daily.   Metoprolol Tartrate 75 MG Tabs Take 75 mg by mouth 2 (two) times daily.   NOVOLOG FLEXPEN 100 UNIT/ML FlexPen Generic drug:  insulin aspart Inject 12 Units into the skin 3 (three) times daily.   predniSONE 10 MG tablet  Commonly known as:  DELTASONE 30 mg po daily for 2 days, 20 mg po daily for 2 days, 10 mg po daily for 2 days.   traZODone 150 MG tablet Commonly known as:  DESYREL Take 150 mg by mouth at bedtime.   verapamil 240 MG CR tablet Commonly known as:  CALAN-SR Take 240 mg by mouth 2 (two) times daily.        DISCHARGE INSTRUCTIONS:  See AVS.  If you experience worsening of your admission symptoms, develop shortness of breath, life threatening emergency, suicidal or homicidal thoughts you must seek medical attention  immediately by calling 911 or calling your MD immediately  if symptoms less severe.  You Must read complete instructions/literature along with all the possible adverse reactions/side effects for all the Medicines you take and that have been prescribed to you. Take any new Medicines after you have completely understood and accpet all the possible adverse reactions/side effects.   Please note  You were cared for by a hospitalist during your hospital stay. If you have any questions about your discharge medications or the care you received while you were in the hospital after you are discharged, you can call the unit and asked to speak with the hospitalist on call if the hospitalist that took care of you is not available. Once you are discharged, your primary care physician will handle any further medical issues. Please note that NO REFILLS for any discharge medications will be authorized once you are discharged, as it is imperative that you return to your primary care physician (or establish a relationship with a primary care physician if you do not have one) for your aftercare needs so that they can reassess your need for medications and monitor your lab values.    On the day of Discharge:  VITAL SIGNS:  Blood pressure (!) 135/51, pulse (!) 56, temperature 98.2 F (36.8 C), temperature source Oral, resp. rate 20, height 5\' 4"  (1.626 m), weight 229 lb 11.2 oz (104.2 kg), SpO2 98 %. PHYSICAL EXAMINATION:  GENERAL:  56 y.o.-year-old patient lying in the bed with no acute distress. Morbid obese. EYES: Pupils equal, round, reactive to light and accommodation. No scleral icterus. Extraocular muscles intact.  HEENT: Head atraumatic, normocephalic. Oropharynx and nasopharynx clear.  NECK:  Supple, no jugular venous distention. No thyroid enlargement, no tenderness.  LUNGS: Normal breath sounds bilaterally, no wheezing, rales,rhonchi or crepitation. No use of accessory muscles of respiration.    CARDIOVASCULAR: S1, S2 normal. No murmurs, rubs, or gallops.  ABDOMEN: Soft, non-tender, non-distended. Bowel sounds present. No organomegaly or mass.  EXTREMITIES: No pedal edema, cyanosis, or clubbing.  NEUROLOGIC: Cranial nerves II through XII are intact. Muscle strength 5/5 in all extremities. Sensation intact. Gait not checked.  PSYCHIATRIC: The patient is alert and oriented x 3.  SKIN: No obvious rash, lesion, or ulcer.  DATA REVIEW:   CBC  Recent Labs Lab 09/08/16 0857  WBC 15.9*  HGB 12.0  HCT 36.8  PLT 252    Chemistries   Recent Labs Lab 09/04/16 2029  09/08/16 0857 09/11/16 0606  NA 140  < > 133*  --   K 4.3  < > 4.3  --   CL 106  < > 96*  --   CO2 23  < > 30  --   GLUCOSE 170*  < > 290*  --   BUN 16  < > 41*  --   CREATININE 0.94  < > 0.95 0.78  CALCIUM 9.1  < >  9.1  --   AST 23  --   --   --   ALT 18  --   --   --   ALKPHOS 70  --   --   --   BILITOT 0.4  --   --   --   < > = values in this interval not displayed.   Microbiology Results  Results for orders placed or performed during the hospital encounter of 09/04/16  MRSA PCR Screening     Status: None   Collection Time: 09/08/16 12:02 AM  Result Value Ref Range Status   MRSA by PCR NEGATIVE NEGATIVE Final    Comment:        The GeneXpert MRSA Assay (FDA approved for NASAL specimens only), is one component of a comprehensive MRSA colonization surveillance program. It is not intended to diagnose MRSA infection nor to guide or monitor treatment for MRSA infections.     RADIOLOGY:  No results found.   Management plans discussed with the patient, family and they are in agreement.  CODE STATUS:  Code Status History    Date Active Date Inactive Code Status Order ID Comments User Context   09/05/2016  3:05 AM 09/11/2016  4:01 PM Full Code [id number]  Harvie Bridge, DO Inpatient      TOTAL TIME TAKING CARE OF THIS PATIENT: 36 minutes.    Demetrios Loll M.D on 09/11/2016 at 5:08  PM  Between 7am to 6pm - Pager - [phone]  After 6pm go to [email or web address] - [employer] [facility]  Office  [phone]  CC: Primary care physician; [facility]   Note: This dictation was prepared with Dragon dictation along with smaller phrase technology. Any transcriptional errors that result from this process are unintentional.

## 2016-09-11 NOTE — Discharge Instructions (Signed)
Heart healthy and ADA diet. Sleep study as outpatient.

## 2016-09-11 NOTE — Progress Notes (Signed)
Patient discharged per MD order and hospital protocol. Patient verbalized understanding of medications, follow up appointments and discharge instructions. Patient given 5 prescriptions for her medications. Patient escorted off the unit via hospital staff.

## 2017-05-10 DIAGNOSIS — R7989 Other specified abnormal findings of blood chemistry: Secondary | ICD-10-CM

## 2017-05-10 DIAGNOSIS — R778 Other specified abnormalities of plasma proteins: Secondary | ICD-10-CM | POA: Insufficient documentation

## 2017-10-02 ENCOUNTER — Encounter: Payer: Self-pay | Admitting: Internal Medicine

## 2017-12-05 ENCOUNTER — Encounter: Payer: Self-pay | Admitting: Internal Medicine

## 2018-02-27 DIAGNOSIS — G8929 Other chronic pain: Secondary | ICD-10-CM | POA: Insufficient documentation

## 2018-02-27 DIAGNOSIS — M25511 Pain in right shoulder: Secondary | ICD-10-CM

## 2018-04-17 ENCOUNTER — Other Ambulatory Visit: Payer: Self-pay

## 2018-04-17 ENCOUNTER — Emergency Department: Payer: BC Managed Care – PPO

## 2018-04-17 ENCOUNTER — Encounter: Payer: Self-pay | Admitting: Emergency Medicine

## 2018-04-17 ENCOUNTER — Inpatient Hospital Stay
Admission: EM | Admit: 2018-04-17 | Discharge: 2018-04-20 | DRG: 291 | Disposition: A | Discharge status: Home / Self Care | Payer: BC Managed Care – PPO | Attending: Family Medicine | Admitting: Family Medicine

## 2018-04-17 DIAGNOSIS — R0902 Hypoxemia: Secondary | ICD-10-CM | POA: Diagnosis not present

## 2018-04-17 DIAGNOSIS — I421 Obstructive hypertrophic cardiomyopathy: Secondary | ICD-10-CM | POA: Diagnosis present

## 2018-04-17 DIAGNOSIS — R7989 Other specified abnormal findings of blood chemistry: Secondary | ICD-10-CM | POA: Diagnosis present

## 2018-04-17 DIAGNOSIS — I700 Atherosclerosis of aorta: Secondary | ICD-10-CM | POA: Diagnosis present

## 2018-04-17 DIAGNOSIS — I110 Hypertensive heart disease with heart failure: Secondary | ICD-10-CM | POA: Diagnosis not present

## 2018-04-17 DIAGNOSIS — R918 Other nonspecific abnormal finding of lung field: Secondary | ICD-10-CM | POA: Diagnosis present

## 2018-04-17 DIAGNOSIS — Z95810 Presence of automatic (implantable) cardiac defibrillator: Secondary | ICD-10-CM

## 2018-04-17 DIAGNOSIS — Z7982 Long term (current) use of aspirin: Secondary | ICD-10-CM

## 2018-04-17 DIAGNOSIS — I248 Other forms of acute ischemic heart disease: Secondary | ICD-10-CM | POA: Diagnosis present

## 2018-04-17 DIAGNOSIS — D638 Anemia in other chronic diseases classified elsewhere: Secondary | ICD-10-CM | POA: Diagnosis present

## 2018-04-17 DIAGNOSIS — E079 Disorder of thyroid, unspecified: Secondary | ICD-10-CM | POA: Diagnosis present

## 2018-04-17 DIAGNOSIS — I5033 Acute on chronic diastolic (congestive) heart failure: Secondary | ICD-10-CM | POA: Diagnosis present

## 2018-04-17 DIAGNOSIS — J9601 Acute respiratory failure with hypoxia: Secondary | ICD-10-CM | POA: Diagnosis present

## 2018-04-17 DIAGNOSIS — J440 Chronic obstructive pulmonary disease with acute lower respiratory infection: Secondary | ICD-10-CM | POA: Diagnosis present

## 2018-04-17 DIAGNOSIS — Z9119 Patient's noncompliance with other medical treatment and regimen: Secondary | ICD-10-CM

## 2018-04-17 DIAGNOSIS — I214 Non-ST elevation (NSTEMI) myocardial infarction: Secondary | ICD-10-CM

## 2018-04-17 DIAGNOSIS — I083 Combined rheumatic disorders of mitral, aortic and tricuspid valves: Secondary | ICD-10-CM | POA: Diagnosis present

## 2018-04-17 DIAGNOSIS — Z599 Problem related to housing and economic circumstances, unspecified: Secondary | ICD-10-CM

## 2018-04-17 DIAGNOSIS — J189 Pneumonia, unspecified organism: Secondary | ICD-10-CM | POA: Diagnosis present

## 2018-04-17 DIAGNOSIS — G4733 Obstructive sleep apnea (adult) (pediatric): Secondary | ICD-10-CM | POA: Diagnosis present

## 2018-04-17 DIAGNOSIS — E1165 Type 2 diabetes mellitus with hyperglycemia: Secondary | ICD-10-CM | POA: Diagnosis present

## 2018-04-17 DIAGNOSIS — I2510 Atherosclerotic heart disease of native coronary artery without angina pectoris: Secondary | ICD-10-CM | POA: Diagnosis present

## 2018-04-17 DIAGNOSIS — Z6838 Body mass index (BMI) 38.0-38.9, adult: Secondary | ICD-10-CM

## 2018-04-17 DIAGNOSIS — Z794 Long term (current) use of insulin: Secondary | ICD-10-CM

## 2018-04-17 DIAGNOSIS — G4700 Insomnia, unspecified: Secondary | ICD-10-CM | POA: Diagnosis present

## 2018-04-17 DIAGNOSIS — E11319 Type 2 diabetes mellitus with unspecified diabetic retinopathy without macular edema: Secondary | ICD-10-CM | POA: Diagnosis present

## 2018-04-17 DIAGNOSIS — E785 Hyperlipidemia, unspecified: Secondary | ICD-10-CM | POA: Diagnosis present

## 2018-04-17 DIAGNOSIS — E042 Nontoxic multinodular goiter: Secondary | ICD-10-CM | POA: Diagnosis present

## 2018-04-17 DIAGNOSIS — Z79899 Other long term (current) drug therapy: Secondary | ICD-10-CM

## 2018-04-17 DIAGNOSIS — Z7722 Contact with and (suspected) exposure to environmental tobacco smoke (acute) (chronic): Secondary | ICD-10-CM | POA: Diagnosis present

## 2018-04-17 DIAGNOSIS — J441 Chronic obstructive pulmonary disease with (acute) exacerbation: Secondary | ICD-10-CM | POA: Diagnosis present

## 2018-04-17 DIAGNOSIS — I272 Pulmonary hypertension, unspecified: Secondary | ICD-10-CM | POA: Diagnosis present

## 2018-04-17 DIAGNOSIS — R0603 Acute respiratory distress: Secondary | ICD-10-CM | POA: Diagnosis present

## 2018-04-17 DIAGNOSIS — R0789 Other chest pain: Secondary | ICD-10-CM | POA: Diagnosis present

## 2018-04-17 HISTORY — DX: Other hypertrophic cardiomyopathy: I42.2

## 2018-04-17 HISTORY — DX: Presence of automatic (implantable) cardiac defibrillator: Z95.810

## 2018-04-17 HISTORY — DX: Type 2 diabetes mellitus with unspecified diabetic retinopathy without macular edema: E11.319

## 2018-04-17 HISTORY — DX: Chronic obstructive pulmonary disease, unspecified: J44.9

## 2018-04-17 LAB — BASIC METABOLIC PANEL
ANION GAP: 6 (ref 5–15)
BUN: 11 mg/dL (ref 6–20)
CALCIUM: 8.9 mg/dL (ref 8.9–10.3)
CO2: 24 mmol/L (ref 22–32)
CREATININE: 0.6 mg/dL (ref 0.44–1.00)
Chloride: 110 mmol/L (ref 98–111)
Glucose, Bld: 181 mg/dL — ABNORMAL HIGH (ref 70–99)
Potassium: 3.9 mmol/L (ref 3.5–5.1)
SODIUM: 140 mmol/L (ref 135–145)

## 2018-04-17 LAB — CBC
HCT: 34.4 % — ABNORMAL LOW (ref 35.0–47.0)
HEMOGLOBIN: 11.5 g/dL — AB (ref 12.0–16.0)
MCH: 27.6 pg (ref 26.0–34.0)
MCHC: 33.4 g/dL (ref 32.0–36.0)
MCV: 82.6 fL (ref 80.0–100.0)
PLATELETS: 248 10*3/uL (ref 150–440)
RBC: 4.17 MIL/uL (ref 3.80–5.20)
RDW: 15.7 % — ABNORMAL HIGH (ref 11.5–14.5)
WBC: 11.3 10*3/uL — AB (ref 3.6–11.0)

## 2018-04-17 LAB — TROPONIN I: TROPONIN I: 0.45 ng/mL — AB (ref <0.03)

## 2018-04-17 LAB — BRAIN NATRIURETIC PEPTIDE: B NATRIURETIC PEPTIDE 5: 875 pg/mL — AB (ref 0.0–100.0)

## 2018-04-17 MED ORDER — ASPIRIN 81 MG PO CHEW
324.0000 mg | CHEWABLE_TABLET | Freq: Once | ORAL | Status: AC
  Administered 2018-04-18: 324 mg via ORAL
  Filled 2018-04-17: qty 4

## 2018-04-17 MED ORDER — FUROSEMIDE 10 MG/ML IJ SOLN
40.0000 mg | Freq: Once | INTRAMUSCULAR | Status: AC
Start: 2018-04-18 — End: 2018-04-18
  Administered 2018-04-18: 40 mg via INTRAVENOUS
  Filled 2018-04-17: qty 4

## 2018-04-17 MED ORDER — NITROGLYCERIN 2 % TD OINT
1.0000 [in_us] | TOPICAL_OINTMENT | Freq: Once | TRANSDERMAL | Status: AC
  Administered 2018-04-18: 1 [in_us] via TOPICAL
  Filled 2018-04-17: qty 1

## 2018-04-17 MED ORDER — HEPARIN SODIUM (PORCINE) 5000 UNIT/ML IJ SOLN
4000.0000 [IU] | Freq: Once | INTRAMUSCULAR | Status: AC
  Administered 2018-04-18: 4000 [IU] via INTRAVENOUS
  Filled 2018-04-17: qty 1

## 2018-04-17 NOTE — ED Notes (Addendum)
Pt oxygen sat were between 88-89% with good waveform. Pt states that she was starting to have trouble breathing again. Pt placed on 3L in order to achieve O2 sats above 90%.

## 2018-04-17 NOTE — ED Triage Notes (Addendum)
Pt presents to ED via [facility] from home c/o SOB worsening over the past few days after moving to a new home. Hx COPD and CHF. States she has been unable to sleep laying flat. Pt given 125mg  Solu-Medrol IV, 10mg  albuterol neb, and x1 atrovent neb PTA which pt states helped. Pt also reports non-productive cough x3 days.

## 2018-04-17 NOTE — ED Provider Notes (Addendum)
[facility] Emergency Department Provider Note  ___________________________________________   None    (approximate)  I have reviewed the triage vital signs and the nursing notes.   HISTORY  Chief Complaint Shortness of Breath  HPI Julie Bender is a 57 y.o. female who presents to the emergency department for treatment and evaluation of shortness of breath is been present for the past 3 days.  Patient has a history of COPD and CHF.  She has been moving over the past few days and feels like she has overexerted herself.  Patient arrived via EMS and received Solu-Medrol, albuterol, and Atrovent which has improved her symptoms significantly.  She has a significant past medical history of diabetes, congestive heart failure, hypertrophic cardiomyopathy, and COPD.   Past Medical History:  Diagnosis Date  . CHF (congestive heart failure) ([place])   . COPD (chronic obstructive pulmonary disease) ([place])   . Diabetes mellitus without complication ([place])   . Diabetic retinopathy ([place])   . History of placement of internal cardiac defibrillator   . Hypertension   . Hypertrophic cardiomyopathy [facility])     Patient Active Problem List   Diagnosis Date Noted  . NSTEMI (non-ST elevated myocardial infarction) ([place]) 04/18/2018  . Bronchitis 09/06/2016  . Demand ischemia ([place]) 09/06/2016  . HOCM (hypertrophic obstructive cardiomyopathy) ([place]) 09/06/2016    Past Surgical History:  Procedure Laterality Date  . CARDIAC DEFIBRILLATOR PLACEMENT    . CHOLECYSTECTOMY      Prior to Admission medications   Medication Sig Start Date End Date Taking? Authorizing Provider  albuterol (PROVENTIL HFA;VENTOLIN HFA) 108 (90 Base) MCG/ACT inhaler Inhale 2 puffs into the lungs every 6 (six) hours as needed for wheezing or shortness of breath. 09/11/16  Yes Demetrios Loll, MD  aspirin EC 81 MG tablet Take 81 mg by mouth daily.   Yes [provider]  atorvastatin (LIPITOR) 20 MG tablet  Take 20 mg by mouth daily. 05/03/16 04/17/18 Yes [provider]  benzonatate (TESSALON) 100 MG capsule Take 1 capsule (100 mg total) by mouth 3 (three) times daily as needed for cough. 09/11/16  Yes Demetrios Loll, MD  carvedilol (COREG) 25 MG tablet Take 25 mg by mouth 2 (two) times daily with a meal.   Yes [provider]  citalopram (CELEXA) 20 MG tablet Take 20 mg by mouth daily. 05/03/16 04/17/18 Yes [provider]  insulin aspart (NOVOLOG FLEXPEN) 100 UNIT/ML FlexPen Inject 12 Units into the skin 3 (three) times daily. 08/17/16  Yes [provider]  insulin glargine (LANTUS) 100 UNIT/ML injection Inject 40 Units into the skin at bedtime. 04/02/16  Yes [provider]  losartan (COZAAR) 100 MG tablet Take 100 mg by mouth daily. 09/01/16 04/17/18 Yes [provider]  metFORMIN (GLUCOPHAGE) 850 MG tablet Take 850 mg by mouth 3 (three) times daily. 05/03/16 04/17/18 Yes [provider]  Metoprolol Tartrate 75 MG TABS Take 75 mg by mouth 2 (two) times daily. 09/11/16  Yes Demetrios Loll, MD  spironolactone (ALDACTONE) 25 MG tablet Take 25 mg by mouth daily.   Yes [provider]  torsemide (DEMADEX) 20 MG tablet Take 60 mg by mouth daily.   Yes [provider]  traZODone (DESYREL) 150 MG tablet Take 150 mg by mouth at bedtime. 04/02/16  Yes [provider]  verapamil (CALAN-SR) 240 MG CR tablet Take 240 mg by mouth 2 (two) times daily. 05/03/16 04/17/18 Yes [provider]  VICTOZA 18 MG/3ML SOPN Inject 0.6 mg into  the skin daily. 02/27/18  Yes [provider]  bumetanide (BUMEX) 1 MG tablet Take 1 mg by mouth 2 (two) times daily. 08/31/16 09/30/16  [provider]  predniSONE (DELTASONE) 10 MG tablet 30 mg po daily for 2 days, 20 mg po daily for 2 days, 10 mg po daily for 2 days. Patient not taking: Reported on 04/17/2018 09/11/16   Demetrios Loll, MD    Allergies Patient has no known allergies.  History reviewed.  No pertinent family history.  Social History Social History   Tobacco Use  . Smoking status: Passive Smoke Exposure - Never Smoker  . Smokeless tobacco: Never Used  Substance Use Topics  . Alcohol use: No  . Drug use: Never    Review of Systems  Constitutional: No fever/chills Eyes: No visual changes. ENT: No sore throat. Cardiovascular: Denies chest pain. Respiratory: Positive for shortness of breath. Gastrointestinal: No abdominal pain.  No nausea, no vomiting.  No diarrhea.  No constipation. Genitourinary: Negative for dysuria. Musculoskeletal: Negative for back pain. Skin: Negative for rash. Neurological: Negative for headaches, focal weakness or numbness. ____________________________________________   PHYSICAL EXAM:  VITAL SIGNS: ED Triage Vitals  Enc Vitals Group     BP 04/17/18 2215 (!) 158/70     Pulse Rate 04/17/18 2215 70     Resp 04/17/18 2215 20     Temp 04/17/18 2215 98.7 F (37.1 C)     Temp Source 04/17/18 2215 Oral     SpO2 04/17/18 2215 94 %     Weight 04/17/18 2220 214 lb (97.1 kg)     Height 04/17/18 2220 5\' 4"  (1.626 m)     Head Circumference --      Peak Flow --      Pain Score 04/17/18 2220 0     Pain Loc --      Pain Edu? --      Excl. in [place]? --     Constitutional: Alert and oriented. Well appearing and in no acute distress. Eyes: Conjunctivae are normal. Head: Atraumatic. Nose: No congestion/rhinnorhea. Mouth/Throat: Mucous membranes are moist.  Oropharynx non-erythematous. Neck: No stridor.   Cardiovascular: Normal rate, regular rhythm. Grossly normal heart sounds.  Good peripheral circulation.  No pitting edema Respiratory: Normal respiratory effort.  No retractions. Lungs CTAB. Gastrointestinal: Soft and nontender. No distention. No abdominal bruits. No CVA tenderness. Musculoskeletal: No lower extremity tenderness nor edema.  No joint effusions. Neurologic:  Normal speech and language. No gross focal neurologic deficits are  appreciated. No gait instability. Skin:  Skin is warm, dry and intact. No rash noted. Psychiatric: Mood and affect are normal. Speech and behavior are normal.  ____________________________________________   LABS (all labs ordered are listed, but only abnormal results are displayed)  Labs Reviewed  BASIC METABOLIC PANEL - Abnormal; Notable for the following components:      Result Value   Glucose, Bld 181 (*)    All other components within normal limits  CBC - Abnormal; Notable for the following components:   WBC 11.3 (*)    Hemoglobin 11.5 (*)    HCT 34.4 (*)    RDW 15.7 (*)    All other components within normal limits  TROPONIN I - Abnormal; Notable for the following components:   Troponin I 0.45 (*)    All other components within normal limits  BRAIN NATRIURETIC PEPTIDE - Abnormal; Notable for the following components:   B Natriuretic Peptide 875.0 (*)    All other components within normal limits  MAGNESIUM - Abnormal; Notable for the following components:   Magnesium 1.6 (*)    All other components within normal limits  RESPIRATORY PANEL BY PCR  APTT  PROTIME-INR  PHOSPHORUS  HEPATIC FUNCTION PANEL  TROPONIN I  TROPONIN I  LACTIC ACID, PLASMA  PROCALCITONIN   ____________________________________________  EKG  ED ECG REPORT I, Piercen Covino, personally viewed this ECG.   Date: 04/17/2018  EKG Time: 22: 27  Rate: 3  Rhythm: unchanged from previous tracings  Axis: Normal  Intervals:none  ST&T Change: No ST elevation or reciprocal changes  ____________________________________________  RADIOLOGY  ED MD interpretation: Chest x-ray is  Official radiology report(s): Dg Chest 2 View  Result Date: 04/17/2018 CLINICAL DATA:  Dyspnea worsening over the past few days. EXAM: CHEST - 2 VIEW COMPARISON:  09/09/2016 FINDINGS: Stable cardiomegaly with aortic atherosclerosis. Diffuse bilateral vascular congestion and interstitial edema compatible with CHF. Superimposed  pneumonia especially in the right upper and lower lobes cannot be entirely excluded given more confluent opacities noted. ICD device with leads in the right atrium and right ventricle appear stable. No acute osseous abnormality. No pleural effusion or pneumothorax. IMPRESSION: Stable cardiomegaly with aortic atherosclerosis. Central vascular and interstitial edema is identified. Slightly more confluent opacities in the right upper and lower lobes more likely are secondary to CHF. Superimposed pneumonia would be difficult to entirely exclude. Electronically Signed   By: Ashley Royalty M.D.   On: 04/17/2018 22:44    ____________________________________________   PROCEDURES  Procedure(s) performed: None  Procedures  Critical Care performed: No  ____________________________________________   INITIAL IMPRESSION / ASSESSMENT AND PLAN / ED COURSE  As part of my medical decision making, I reviewed the following data within the electronic MEDICAL RECORD NUMBER Notes from prior ED visits   57 year old female presenting to the emergency department for treatment and evaluation of shortness of breath.  Patient had significant relief with steroids and nebulized treatments while in route to the hospital.  She states that she is now feeling short of breath again.  ----------------------------------------- 12:00 AM on 04/18/2018 ----------------------------------------- Although troponin has been slightly elevated in the past, 0.45 is abnormally high for her.  Chest x-ray cannot rule out pneumonia, however the patient is not tachycardic and has not been febrile.   This is most likely findings consistent with CHF.She has been slightly hypoxic and is now on 3 L via nasal cannula which has brought her from 88% on room air to 92%.   Orders for non-STEMI have been entered and she will be admitted.  Patient is agreeable to the plan.     __________________________________________   FINAL CLINICAL IMPRESSION(S) /  ED DIAGNOSES  Final diagnoses:  NSTEMI (non-ST elevated myocardial infarction) [facility])  Hypoxia     ED Discharge Orders    None       Note:  This document was prepared using Dragon voice recognition software and may include unintentional dictation errors.    Victorino Dike, FNP 04/18/18 0146    Victorino Dike, FNP 04/18/18 0147    Hinda Kehr, MD 04/18/18 8921    Hinda Kehr, MD 04/18/18 [phone]

## 2018-04-18 ENCOUNTER — Inpatient Hospital Stay: Payer: BC Managed Care – PPO

## 2018-04-18 ENCOUNTER — Encounter: Payer: Self-pay | Admitting: Internal Medicine

## 2018-04-18 ENCOUNTER — Inpatient Hospital Stay (HOSPITAL_COMMUNITY)
Admit: 2018-04-18 | Discharge: 2018-04-18 | Disposition: A | Discharge status: Home / Self Care | Payer: BC Managed Care – PPO | Attending: Physician Assistant | Admitting: Physician Assistant

## 2018-04-18 DIAGNOSIS — E1165 Type 2 diabetes mellitus with hyperglycemia: Secondary | ICD-10-CM | POA: Diagnosis present

## 2018-04-18 DIAGNOSIS — I272 Pulmonary hypertension, unspecified: Secondary | ICD-10-CM | POA: Diagnosis present

## 2018-04-18 DIAGNOSIS — I700 Atherosclerosis of aorta: Secondary | ICD-10-CM | POA: Diagnosis present

## 2018-04-18 DIAGNOSIS — R0600 Dyspnea, unspecified: Secondary | ICD-10-CM

## 2018-04-18 DIAGNOSIS — I421 Obstructive hypertrophic cardiomyopathy: Secondary | ICD-10-CM

## 2018-04-18 DIAGNOSIS — E11319 Type 2 diabetes mellitus with unspecified diabetic retinopathy without macular edema: Secondary | ICD-10-CM | POA: Diagnosis present

## 2018-04-18 DIAGNOSIS — I110 Hypertensive heart disease with heart failure: Secondary | ICD-10-CM | POA: Diagnosis present

## 2018-04-18 DIAGNOSIS — J440 Chronic obstructive pulmonary disease with acute lower respiratory infection: Secondary | ICD-10-CM | POA: Diagnosis present

## 2018-04-18 DIAGNOSIS — I5033 Acute on chronic diastolic (congestive) heart failure: Secondary | ICD-10-CM | POA: Diagnosis present

## 2018-04-18 DIAGNOSIS — D638 Anemia in other chronic diseases classified elsewhere: Secondary | ICD-10-CM | POA: Diagnosis present

## 2018-04-18 DIAGNOSIS — I340 Nonrheumatic mitral (valve) insufficiency: Secondary | ICD-10-CM | POA: Diagnosis not present

## 2018-04-18 DIAGNOSIS — E042 Nontoxic multinodular goiter: Secondary | ICD-10-CM | POA: Diagnosis present

## 2018-04-18 DIAGNOSIS — I2510 Atherosclerotic heart disease of native coronary artery without angina pectoris: Secondary | ICD-10-CM | POA: Diagnosis present

## 2018-04-18 DIAGNOSIS — I083 Combined rheumatic disorders of mitral, aortic and tricuspid valves: Secondary | ICD-10-CM | POA: Diagnosis present

## 2018-04-18 DIAGNOSIS — R0902 Hypoxemia: Secondary | ICD-10-CM | POA: Diagnosis present

## 2018-04-18 DIAGNOSIS — G4733 Obstructive sleep apnea (adult) (pediatric): Secondary | ICD-10-CM | POA: Diagnosis present

## 2018-04-18 DIAGNOSIS — G4700 Insomnia, unspecified: Secondary | ICD-10-CM | POA: Diagnosis present

## 2018-04-18 DIAGNOSIS — R0603 Acute respiratory distress: Secondary | ICD-10-CM | POA: Diagnosis present

## 2018-04-18 DIAGNOSIS — E079 Disorder of thyroid, unspecified: Secondary | ICD-10-CM | POA: Diagnosis present

## 2018-04-18 DIAGNOSIS — R918 Other nonspecific abnormal finding of lung field: Secondary | ICD-10-CM | POA: Diagnosis present

## 2018-04-18 DIAGNOSIS — J441 Chronic obstructive pulmonary disease with (acute) exacerbation: Secondary | ICD-10-CM | POA: Diagnosis present

## 2018-04-18 DIAGNOSIS — E785 Hyperlipidemia, unspecified: Secondary | ICD-10-CM | POA: Diagnosis present

## 2018-04-18 DIAGNOSIS — R0789 Other chest pain: Secondary | ICD-10-CM | POA: Diagnosis present

## 2018-04-18 DIAGNOSIS — I248 Other forms of acute ischemic heart disease: Secondary | ICD-10-CM

## 2018-04-18 DIAGNOSIS — R079 Chest pain, unspecified: Secondary | ICD-10-CM | POA: Diagnosis not present

## 2018-04-18 DIAGNOSIS — J9601 Acute respiratory failure with hypoxia: Secondary | ICD-10-CM | POA: Diagnosis present

## 2018-04-18 DIAGNOSIS — J189 Pneumonia, unspecified organism: Secondary | ICD-10-CM | POA: Diagnosis present

## 2018-04-18 LAB — PROTIME-INR
INR: 0.99
PROTHROMBIN TIME: 13 s (ref 11.4–15.2)

## 2018-04-18 LAB — PHOSPHORUS: Phosphorus: 3.3 mg/dL (ref 2.5–4.6)

## 2018-04-18 LAB — TROPONIN I
Troponin I: 0.39 ng/mL (ref <0.03)
Troponin I: 0.34 ng/mL (ref <0.03)

## 2018-04-18 LAB — GLUCOSE, CAPILLARY
GLUCOSE-CAPILLARY: 245 mg/dL — AB (ref 70–99)
GLUCOSE-CAPILLARY: 296 mg/dL — AB (ref 70–99)
Glucose-Capillary: 281 mg/dL — ABNORMAL HIGH (ref 70–99)
Glucose-Capillary: 291 mg/dL — ABNORMAL HIGH (ref 70–99)

## 2018-04-18 LAB — HEPATIC FUNCTION PANEL
ALBUMIN: 3.7 g/dL (ref 3.5–5.0)
ALK PHOS: 61 U/L (ref 38–126)
ALT: 18 U/L (ref 0–44)
AST: 22 U/L (ref 15–41)
Bilirubin, Direct: 0.1 mg/dL (ref 0.0–0.2)
Indirect Bilirubin: 0.6 mg/dL (ref 0.3–0.9)
Total Bilirubin: 0.7 mg/dL (ref 0.3–1.2)
Total Protein: 6.8 g/dL (ref 6.5–8.1)

## 2018-04-18 LAB — CBC
HEMATOCRIT: 33.9 % — AB (ref 35.0–47.0)
HEMOGLOBIN: 11.3 g/dL — AB (ref 12.0–16.0)
MCH: 27.4 pg (ref 26.0–34.0)
MCHC: 33.2 g/dL (ref 32.0–36.0)
MCV: 82.6 fL (ref 80.0–100.0)
Platelets: 254 10*3/uL (ref 150–440)
RBC: 4.11 MIL/uL (ref 3.80–5.20)
RDW: 15.4 % — ABNORMAL HIGH (ref 11.5–14.5)
WBC: 10.1 10*3/uL (ref 3.6–11.0)

## 2018-04-18 LAB — PROCALCITONIN: Procalcitonin: <0.1 ng/mL

## 2018-04-18 LAB — ECHOCARDIOGRAM COMPLETE
HEIGHTINCHES: 64 in
Weight: 3577.6 oz

## 2018-04-18 LAB — APTT: APTT: 31 s (ref 24–36)

## 2018-04-18 LAB — MAGNESIUM: Magnesium: 1.6 mg/dL — ABNORMAL LOW (ref 1.7–2.4)

## 2018-04-18 LAB — LACTIC ACID, PLASMA: Lactic Acid, Venous: 1.7 mmol/L (ref 0.5–1.9)

## 2018-04-18 MED ORDER — ASPIRIN 81 MG PO CHEW
81.0000 mg | CHEWABLE_TABLET | Freq: Every day | ORAL | Status: DC
  Administered 2018-04-18 – 2018-04-20 (×3): 81 mg via ORAL
  Filled 2018-04-18 (×3): qty 1

## 2018-04-18 MED ORDER — ATORVASTATIN CALCIUM 20 MG PO TABS
40.0000 mg | ORAL_TABLET | Freq: Every day | ORAL | Status: DC
  Administered 2018-04-18 – 2018-04-19 (×2): 40 mg via ORAL
  Filled 2018-04-18 (×2): qty 2

## 2018-04-18 MED ORDER — TORSEMIDE 20 MG PO TABS
60.0000 mg | ORAL_TABLET | Freq: Every day | ORAL | Status: DC
  Administered 2018-04-18 – 2018-04-20 (×3): 60 mg via ORAL
  Filled 2018-04-18 (×3): qty 3

## 2018-04-18 MED ORDER — SENNOSIDES-DOCUSATE SODIUM 8.6-50 MG PO TABS: 1.0000 | ORAL_TABLET | Freq: Every evening | ORAL | Status: DC | PRN

## 2018-04-18 MED ORDER — ONDANSETRON HCL 4 MG/2ML IJ SOLN: 4.0000 mg | Freq: Four times a day (QID) | INTRAMUSCULAR | Status: DC | PRN

## 2018-04-18 MED ORDER — SPIRONOLACTONE 25 MG PO TABS
25.0000 mg | ORAL_TABLET | Freq: Every day | ORAL | Status: DC
  Administered 2018-04-18 – 2018-04-20 (×3): 25 mg via ORAL
  Filled 2018-04-18 (×3): qty 1

## 2018-04-18 MED ORDER — LOSARTAN POTASSIUM 50 MG PO TABS
100.0000 mg | ORAL_TABLET | Freq: Every day | ORAL | Status: DC
  Administered 2018-04-18 – 2018-04-20 (×3): 100 mg via ORAL
  Filled 2018-04-18 (×3): qty 2

## 2018-04-18 MED ORDER — IOHEXOL 350 MG/ML SOLN
75.0000 mL | Freq: Once | INTRAVENOUS | Status: AC | PRN
  Administered 2018-04-18: 75 mL via INTRAVENOUS

## 2018-04-18 MED ORDER — PERFLUTREN LIPID MICROSPHERE
1.0000 mL | INTRAVENOUS | Status: AC | PRN
  Administered 2018-04-18: 2 mL via INTRAVENOUS
  Filled 2018-04-18: qty 10

## 2018-04-18 MED ORDER — METOPROLOL TARTRATE 50 MG PO TABS: 75.0000 mg | ORAL_TABLET | Freq: Two times a day (BID) | ORAL | Status: DC

## 2018-04-18 MED ORDER — ACETAMINOPHEN 325 MG PO TABS: 650.0000 mg | ORAL_TABLET | ORAL | Status: DC | PRN

## 2018-04-18 MED ORDER — INSULIN ASPART 100 UNIT/ML ~~LOC~~ SOLN
0.0000 [IU] | Freq: Three times a day (TID) | SUBCUTANEOUS | Status: DC
  Administered 2018-04-18: 5 [IU] via SUBCUTANEOUS
  Administered 2018-04-18 (×2): 8 [IU] via SUBCUTANEOUS
  Administered 2018-04-19: 5 [IU] via SUBCUTANEOUS
  Administered 2018-04-19: 8 [IU] via SUBCUTANEOUS
  Administered 2018-04-19: 5 [IU] via SUBCUTANEOUS
  Administered 2018-04-20: 3 [IU] via SUBCUTANEOUS
  Administered 2018-04-20: 8 [IU] via SUBCUTANEOUS
  Filled 2018-04-18 (×8): qty 1

## 2018-04-18 MED ORDER — BISACODYL 5 MG PO TBEC
5.0000 mg | DELAYED_RELEASE_TABLET | Freq: Every day | ORAL | Status: DC | PRN
  Administered 2018-04-19: 5 mg via ORAL
  Filled 2018-04-18: qty 1

## 2018-04-18 MED ORDER — MORPHINE SULFATE (PF) 2 MG/ML IV SOLN
2.0000 mg | INTRAVENOUS | Status: DC | PRN
  Administered 2018-04-18 – 2018-04-19 (×4): 2 mg via INTRAVENOUS
  Filled 2018-04-18 (×4): qty 1

## 2018-04-18 MED ORDER — NITROGLYCERIN 0.4 MG SL SUBL: 0.4000 mg | SUBLINGUAL_TABLET | SUBLINGUAL | Status: DC | PRN

## 2018-04-18 MED ORDER — INSULIN ASPART 100 UNIT/ML ~~LOC~~ SOLN
0.0000 [IU] | Freq: Every day | SUBCUTANEOUS | Status: DC
  Administered 2018-04-18: 3 [IU] via SUBCUTANEOUS
  Administered 2018-04-19: 4 [IU] via SUBCUTANEOUS
  Filled 2018-04-18 (×2): qty 1

## 2018-04-18 MED ORDER — INSULIN GLARGINE 100 UNIT/ML ~~LOC~~ SOLN
25.0000 [IU] | Freq: Every day | SUBCUTANEOUS | Status: DC
  Administered 2018-04-18: 25 [IU] via SUBCUTANEOUS
  Filled 2018-04-18 (×2): qty 0.25

## 2018-04-18 MED ORDER — ENOXAPARIN SODIUM 40 MG/0.4ML ~~LOC~~ SOLN
40.0000 mg | SUBCUTANEOUS | Status: DC
  Administered 2018-04-18 – 2018-04-19 (×2): 40 mg via SUBCUTANEOUS
  Filled 2018-04-18 (×2): qty 0.4

## 2018-04-18 MED ORDER — HEPARIN (PORCINE) IN NACL 100-0.45 UNIT/ML-% IJ SOLN
900.0000 [IU]/h | INTRAMUSCULAR | Status: DC
  Administered 2018-04-18: 900 [IU]/h via INTRAVENOUS
  Filled 2018-04-18: qty 250

## 2018-04-18 MED ORDER — ALBUTEROL SULFATE (2.5 MG/3ML) 0.083% IN NEBU: 2.5000 mg | INHALATION_SOLUTION | Freq: Four times a day (QID) | RESPIRATORY_TRACT | Status: DC | PRN

## 2018-04-18 MED ORDER — VERAPAMIL HCL ER 240 MG PO TBCR
240.0000 mg | EXTENDED_RELEASE_TABLET | Freq: Two times a day (BID) | ORAL | Status: DC
  Administered 2018-04-18 – 2018-04-20 (×6): 240 mg via ORAL
  Filled 2018-04-18 (×7): qty 1

## 2018-04-18 MED ORDER — FAMOTIDINE IN NACL 20-0.9 MG/50ML-% IV SOLN
20.0000 mg | Freq: Two times a day (BID) | INTRAVENOUS | Status: DC
  Administered 2018-04-18 – 2018-04-19 (×4): 20 mg via INTRAVENOUS
  Filled 2018-04-18 (×4): qty 50

## 2018-04-18 MED ORDER — TRAZODONE HCL 50 MG PO TABS
150.0000 mg | ORAL_TABLET | Freq: Every evening | ORAL | Status: DC | PRN
  Administered 2018-04-19 (×2): 150 mg via ORAL
  Filled 2018-04-18 (×2): qty 1

## 2018-04-18 MED ORDER — CITALOPRAM HYDROBROMIDE 20 MG PO TABS
20.0000 mg | ORAL_TABLET | Freq: Every day | ORAL | Status: DC
  Administered 2018-04-18 – 2018-04-20 (×3): 20 mg via ORAL
  Filled 2018-04-18 (×3): qty 1

## 2018-04-18 MED ORDER — ALUM & MAG HYDROXIDE-SIMETH 200-200-20 MG/5ML PO SUSP
15.0000 mL | ORAL | Status: DC | PRN
  Administered 2018-04-20: 15 mL via ORAL
  Filled 2018-04-18: qty 30

## 2018-04-18 MED ORDER — MAGNESIUM SULFATE 2 GM/50ML IV SOLN
2.0000 g | Freq: Once | INTRAVENOUS | Status: AC
  Administered 2018-04-18: 2 g via INTRAVENOUS
  Filled 2018-04-18: qty 50

## 2018-04-18 MED ORDER — CARVEDILOL 25 MG PO TABS
25.0000 mg | ORAL_TABLET | Freq: Two times a day (BID) | ORAL | Status: DC
  Administered 2018-04-18 – 2018-04-20 (×4): 25 mg via ORAL
  Filled 2018-04-18 (×4): qty 1

## 2018-04-18 NOTE — Progress Notes (Signed)
Patient complains of shortness of breath, on oxygen by nasal cannula 3 L. Vital signs reviewed, labs reviewed. Acute respiratory failure with hypoxia due to acute on chronic diastolic CHF. Continue Lasix, follow Dr. Donivan Scull recommendation. Elevated troponin due to demanding ischemia.  Ruled out non-STEMI.  Heparin drip was discontinued. Continue current treatment.  I discussed with the patient and nurse.  Time spent about 26 minutes.

## 2018-04-18 NOTE — Consult Note (Signed)
Cardiology Consultation:   Patient ID: Julie Bender; [id number]; [birth year]   Admit date: 04/17/2018 Date of Consult: 04/18/2018  Primary Care Provider: [facility] Primary Cardiologist: [facility]   Patient Profile:   Julie Bender is a 57 y.o. female with a hx of hypertropic cardiomyopathy s/p ICD followed at [facility], nonobstructive CAD by [place] in 03/2016, HFpEF, IDDM, HTN, and obesity who is being seen today for the evaluation of elevated troponin at the request of Dr. Jodell Cipro.  History of Present Illness:   [name] underwent R/LHC in 03/2016 that did not reveal significant CAD with markedly elevated filling pressures with an end-diastolic pressure around 40 mmHg, wedge pressure 25 mmHg, mean pulmonary pressure 41 mmHg, mid cavitary gradient 40-60 mmHg, no SAM. She was admitted to [facility] in 04/2017 with acute on chronic HFpEF with elevated troponin. Echo 04/2017 showed EF >70%, severe LV wall thickness, hyperdynamic LV with mid cavity obliteration, mid ventricular gradient of 41 mmHg increased to 62 mmHg with Valsalva, mild RV dilation with normal function, moderate biatrial enlargement, moderate to severe MR, sclerotic aortic valve, moderate TR, PASP 42-47 mmHg. She was admitted to [facility] in 05/2017 with mild volume overload and acute bronchitis.   Patient has been in the process of moving and has been under increased stress with this. Over the past 2 days, she has noted an increase in exertional dyspnea when walking from room to room. She has not had any chest pain with this. However, she has noted some sharp chest pains with deep inspiration or coughing. She has been compliant with all medications outside of verapamil, which she has been out of "for a while." She reports a dry weight of 214 pounds. She denies any lower extremity swelling, orthopnea or early satiety. No dizziness, presyncope, or syncope. No device discharges.   Upon the patient's arrival to [facility] they were found to have BP  158/70, HR 70 bpm, temp 98.7, oxygen saturation 94% on room air, weight 101.7 kg. EKG not available for review, CXR showed stable cardiomegaly with aortic atherosclerosis, with central vascular and interstitial edema with possible superimposed PNA. Labs showed troponin 0.45-->0.39-->0.34, BNP 875, WBC 11.3, HGB 11.5, PLT 248, K+ 3.9, SCr 0.60, glucose 181, magnesium 1.6, lactic acid 1.7, PCT <0.10. She was given ASA 324 mg x 1, IV Lasix 40 mg x 1, as well as nitropaste in the ED. Upon admission, she was placed on a heparin gtt. Echo and CTA chest are pending. She remains on supplemental oxygen via nasal cannula. She reports her SOB is improving, though still with dyspnea with ambulation to the restroom. No current chest pain.   Past Medical History:  Diagnosis Date  . CHF (congestive heart failure) ([place])   . COPD (chronic obstructive pulmonary disease) ([place])   . Diabetes mellitus without complication ([place])   . Diabetic retinopathy ([place])   . History of placement of internal cardiac defibrillator   . Hypertension   . Hypertrophic cardiomyopathy ([place])     Past Surgical History:  Procedure Laterality Date  . CARDIAC DEFIBRILLATOR PLACEMENT    . CHOLECYSTECTOMY       Home Meds: Prior to Admission medications   Medication Sig Start Date End Date Taking? Authorizing Provider  albuterol (PROVENTIL HFA;VENTOLIN HFA) 108 (90 Base) MCG/ACT inhaler Inhale 2 puffs into the lungs every 6 (six) hours as needed for wheezing or shortness of breath. 09/11/16  Yes Demetrios Loll, MD  aspirin EC 81 MG tablet Take 81 mg by mouth daily.  Yes [provider]  atorvastatin (LIPITOR) 20 MG tablet Take 20 mg by mouth daily. 05/03/16 04/17/18 Yes [provider]  benzonatate (TESSALON) 100 MG capsule Take 1 capsule (100 mg total) by mouth 3 (three) times daily as needed for cough. 09/11/16  Yes Demetrios Loll, MD  carvedilol (COREG) 25 MG tablet Take 25 mg by mouth 2 (two) times daily with a meal.   Yes  [provider]  citalopram (CELEXA) 20 MG tablet Take 20 mg by mouth daily. 05/03/16 04/17/18 Yes [provider]  insulin aspart (NOVOLOG FLEXPEN) 100 UNIT/ML FlexPen Inject 12 Units into the skin 3 (three) times daily. 08/17/16  Yes [provider]  insulin glargine (LANTUS) 100 UNIT/ML injection Inject 40 Units into the skin at bedtime. 04/02/16  Yes [provider]  losartan (COZAAR) 100 MG tablet Take 100 mg by mouth daily. 09/01/16 04/17/18 Yes [provider]  metFORMIN (GLUCOPHAGE) 850 MG tablet Take 850 mg by mouth 3 (three) times daily. 05/03/16 04/17/18 Yes [provider]  Metoprolol Tartrate 75 MG TABS Take 75 mg by mouth 2 (two) times daily. 09/11/16  Yes Demetrios Loll, MD  spironolactone (ALDACTONE) 25 MG tablet Take 25 mg by mouth daily.   Yes [provider]  torsemide (DEMADEX) 20 MG tablet Take 60 mg by mouth daily.   Yes [provider]  traZODone (DESYREL) 150 MG tablet Take 150 mg by mouth at bedtime. 04/02/16  Yes [provider]  verapamil (CALAN-SR) 240 MG CR tablet Take 240 mg by mouth 2 (two) times daily. 05/03/16 04/17/18 Yes [provider]  VICTOZA 18 MG/3ML SOPN Inject 0.6 mg into the skin daily. 02/27/18  Yes [provider]  bumetanide (BUMEX) 1 MG tablet Take 1 mg by mouth 2 (two) times daily. 08/31/16 09/30/16  [provider]  predniSONE (DELTASONE) 10 MG tablet 30 mg po daily for 2 days, 20 mg po daily for 2 days, 10 mg po daily for 2 days. Patient not taking: Reported on 04/17/2018 09/11/16   Demetrios Loll, MD    Inpatient Medications: Scheduled Meds: . aspirin  81 mg Oral Daily  . atorvastatin  40 mg Oral q1800  . carvedilol  25 mg Oral BID WC  . citalopram  20 mg Oral Daily  . insulin aspart  0-15 Units Subcutaneous TID WC  . insulin aspart  0-5 Units Subcutaneous QHS  . insulin glargine  25 Units Subcutaneous QHS  . losartan  100 mg Oral Daily  . spironolactone  25 mg  Oral Daily  . torsemide  60 mg Oral Daily  . verapamil  240 mg Oral BID   Continuous Infusions: . famotidine (PEPCID) IV 20 mg (04/18/18 0317)  . heparin 900 Units/hr (04/18/18 0315)   PRN Meds: acetaminophen, albuterol, alum & mag hydroxide-simeth, bisacodyl, iohexol, morphine injection, nitroGLYCERIN, ondansetron (ZOFRAN) IV, senna-docusate, traZODone  Allergies:  No Known Allergies  Social History:   Social History   Socioeconomic History  . Marital status: Divorced    Spouse name: Not on file  . Number of children: Not on file  . Years of education: Not on file  . Highest education level: Not on file  Occupational History  . Not on file  Social Needs  . Financial resource strain: Not on file  . Food insecurity:    Worry: Not on file    Inability: Not on file  . Transportation needs:    Medical: Not on file    Non-medical: Not on  file  Tobacco Use  . Smoking status: Passive Smoke Exposure - Never Smoker  . Smokeless tobacco: Never Used  Substance and Sexual Activity  . Alcohol use: No  . Drug use: Never  . Sexual activity: Not on file  Lifestyle  . Physical activity:    Days per week: Not on file    Minutes per session: Not on file  . Stress: Not on file  Relationships  . Social connections:    Talks on phone: Not on file    Gets together: Not on file    Attends religious service: Not on file    Active member of club or organization: Not on file    Attends meetings of clubs or organizations: Not on file    Relationship status: Not on file  . Intimate partner violence:    Fear of current or ex partner: Not on file    Emotionally abused: Not on file    Physically abused: Not on file    Forced sexual activity: Not on file  Other Topics Concern  . Not on file  Social History Narrative  . Not on file     Family History:   Family History  Problem Relation Age of Onset  . Hypertrophic cardiomyopathy Sister     ROS:  Review of Systems  Constitutional:  Positive for chills, diaphoresis, fever and malaise/fatigue. Negative for weight loss.  HENT: Negative for congestion.   Eyes: Negative for discharge and redness.  Respiratory: Positive for cough, shortness of breath and wheezing. Negative for hemoptysis and sputum production.   Cardiovascular: Positive for chest pain. Negative for palpitations, orthopnea, claudication, leg swelling and PND.       Sharp chest pain that is brought on by deep inspiration or coughing  Gastrointestinal: Negative for abdominal pain, blood in stool, heartburn, melena, nausea and vomiting.  Genitourinary: Negative for hematuria.  Musculoskeletal: Positive for myalgias. Negative for falls.  Skin: Negative for rash.  Neurological: Positive for weakness. Negative for dizziness, tingling, tremors, sensory change, speech change, focal weakness and loss of consciousness.  Endo/Heme/Allergies: Does not bruise/bleed easily.  Psychiatric/Behavioral: Negative for substance abuse. The patient is not nervous/anxious.   All other systems reviewed and are negative.     Physical Exam/Data:   Vitals:   04/18/18 0030 04/18/18 0210 04/18/18 0324 04/18/18 0740  BP: (!) 155/79 (!) 149/75 134/70 122/63  Pulse: 74 70 61 65  Resp: (!) 22 18 18  (!) 24  Temp:  97.7 F (36.5 C) 97.7 F (36.5 C) 98.4 F (36.9 C)  TempSrc:  Oral Oral Oral  SpO2: 94% 92% 93% 97%  Weight:  101.4 kg    Height:  5\' 4"  (1.626 m)      Intake/Output Summary (Last 24 hours) at 04/18/2018 0821 Last data filed at 04/18/2018 0500 Gross per 24 hour  Intake -  Output 0 ml  Net 0 ml   Filed Weights   04/17/18 2220 04/18/18 0210  Weight: 97.1 kg 101.4 kg   Body mass index is 38.38 kg/m.   Physical Exam: General: Well developed, well nourished, in no acute distress. Head: Normocephalic, atraumatic, sclera non-icteric, no xanthomas, nares without discharge.  Neck: Negative for carotid bruits. JVD not elevated. Lungs: Clear bilaterally to auscultation  without wheezes, rales, or rhonchi. Breathing is unlabored. Heart: RRR with S1 S2. No murmurs, rubs, or gallops appreciated. Abdomen: Obese, soft, non-tender, non-distended with normoactive bowel sounds. No hepatomegaly. No rebound/guarding. No obvious abdominal masses. Msk:  Strength and tone  appear normal for age. Extremities: No clubbing or cyanosis. Trace bilateral pre-tibial edema edema. Distal pedal pulses are 2+ and equal bilaterally. Neuro: Alert and oriented X 3. No facial asymmetry. No focal deficit. Moves all extremities spontaneously. Psych:  Responds to questions appropriately with a normal affect.   EKG:  The EKG was personally reviewed and demonstrates: no EKG in Epic or paper chart for review Telemetry:  Telemetry was personally reviewed and demonstrates: NSR  Weights: Filed Weights   04/17/18 2220 04/18/18 0210  Weight: 97.1 kg 101.4 kg    Relevant CV Studies:  [facility] 03/2016: FINAL CARDIAC CATHETERIZATION REPORT (Right and Left Heart) Conclusions: - No significant coronary artery disease - Pulmonary hypertension with mean of 41 - Elevated wedge pressure 25 - No significant resting gradient from the ventricle - With Valsalva mild 20 mm gradient - With extreme PVC 30 mm gradient but no decrease in pulse pressure __________  2-D echo 04/2017: IMPRESSIONS  Small left ventricular cavity size.  Left ventricular wall thickness severely increased.  Hyperdynamic left ventricle with mid cavity obliteration.  Mid ventricular gradient of 41 mmHg increased to 62 mmHg withValsalva.  Normal left ventricular ejection fraction estimated at >70%.  Septal E/E' ratio is 21.4 indicating abnormal filling pressure.  Mild right ventricular dilatation with normal function.  Moderate right atrial dilatation.  Moderate left atrial dilatation.  Moderate to severe mitral regurgitation(Likely more moderate than severe).  Aortic valve appears to be sclerotic. (Increased gradient due to  HOCM) Mild aortic regurgitation.  Moderate tricuspid regurgitation.  Estimated PASP 42-47 mmHg. ___________  Laboratory Data:  Chemistry Recent Labs  Lab 04/17/18 2224  NA 140  K 3.9  CL 110  CO2 24  GLUCOSE 181*  BUN 11  CREATININE 0.60  CALCIUM 8.9  GFRNONAA >60  GFRAA >60  ANIONGAP 6    Recent Labs  Lab 04/17/18 2224  PROT 6.8  ALBUMIN 3.7  AST 22  ALT 18  ALKPHOS 61  BILITOT 0.7   Hematology Recent Labs  Lab 04/17/18 2224  WBC 11.3*  RBC 4.17  HGB 11.5*  HCT 34.4*  MCV 82.6  MCH 27.6  MCHC 33.4  RDW 15.7*  PLT 248   Cardiac Enzymes Recent Labs  Lab 04/17/18 2224 04/18/18 0231 04/18/18 0532  TROPONINI 0.45* 0.39* 0.34*   No results for input(s): TROPIPOC in the last 168 hours.  BNP Recent Labs  Lab 04/17/18 2224  BNP 875.0*    DDimer No results for input(s): DDIMER in the last 168 hours.  Radiology/Studies:  Dg Chest 2 View  Result Date: 04/17/2018 IMPRESSION: Stable cardiomegaly with aortic atherosclerosis. Central vascular and interstitial edema is identified. Slightly more confluent opacities in the right upper and lower lobes more likely are secondary to CHF. Superimposed pneumonia would be difficult to entirely exclude. Electronically Signed   By: Ashley Royalty M.D.   On: 04/17/2018 22:44    Assessment and Plan:   1. Elevated troponin: -Minimally elevated with a peak of 0.45, down trending -Chest pain is pleuritic  -Likely supply demand ischemia in the setting of her cardiomyopathy and pulmonary infection -Check echo -Recent LHC with no significant CAD in 03/2016 -If echo demonstrates stable EF, no plans for inpatient ischemic evaluation -Stop heparin gtt   2. Atypical chest pain: -Consistent with pulmonary etiology -CTA chest pending per IM -If echo shows stable EF, recommend IM further evaluate non-cardiac causes  3. Hypertrophic cardiomyopathy: -Echo pending -Continue Coreg, verapamil, torsemide, and spironolactone  4.  AECOPD/bronchitis/possible PNA/leukocytosis: -  Per IM  5. Hypomagnesemia: -Recommend repletion to goal of 2.0  6. IDDM: -Per IM  7. Obesity: -Weight loss is advised  8. Untreated sleep apnea: -Was diagnosed ~ 2 months prior and does not have the money for a CPAP -She would certainly do better with this -Recommend case manager consult to assist with this   For questions or updates, please contact [place] Please consult [email or web address] for contact info under Cardiology/STEMI.   Signed, Christell Faith, PA-C [place] Pager: [phone] 04/18/2018, 8:21 AM

## 2018-04-18 NOTE — Progress Notes (Signed)
*  PRELIMINARY RESULTS* Echocardiogram 2D Echocardiogram has been performed.  Julie Bender 04/18/2018, 4:30 PM

## 2018-04-18 NOTE — H&P (Addendum)
[facility] [place] NAME: Julie Bender    MR#:  [id number]  DATE OF BIRTH:  04/21/1961  DATE OF ADMISSION:  04/17/2018  PRIMARY CARE PHYSICIAN: [facility]   REQUESTING/REFERRING PHYSICIAN: Victorino Dike, FNP  CHIEF COMPLAINT:   Chief Complaint  Patient presents with  . Shortness of Breath    HISTORY OF PRESENT ILLNESS:  Julie Bender  is a 57 y.o. female with a known history of T2IDDM (w/ retinopathy), HTN, HLD, COPD, HOCM (EF 70% w/ HOCM + mod MR as of 05/2017 Echo), s/p [employer] AICD/PPM, Troponin leak p/w SOB, Trop-I elevation. Pt states that she was feeling reasonably well until ~2d ago. She states she developed acute-onset SOB, initially mild, but progressively worsening. She states the first thing she noticed was that she needed to rest more frequently while walking inside the house. In addition to decreased exercise tolerance, she also endorses orthopnea, PND and insomnia. She denies leg edema. She endorses non-productive dry cough x2d, as well as subjective wheezing (though she does not exhibit wheezing on exam). She endorses chest pain, which she characterizes as "intermittent twinges" of sharp pain in her L upper chest, worsening w/ cough and deep inspiration. She states the discomfort increases when lying back, and improves when leaning forward. She endorses chills, diaphoresis and myalgias, but denies fever and rigors. She endorses sore throat, heartburn/indigestion, nausea and epigastric AP.  Pt had cardiac catheterization @ [facility] in 03/2016. (-) CAD, end-diastolic pressure 40 mmHg, wedge pressure 25 mm Hg, mean pulmonary pressure 41 mm Hg, mid-cavity gradient 40-60 mm Hg, (-) systolic anterior motion of the mitral valve. [facility] ([place]) on 09/29/2017. She has a documented Hx of Troponin leak/baseline elevation. She had a persistent Trop-I elevation of ~0.20 on prior admission in 08/2016. Trop-I on  present admission 0.45. (-) AICD shocks.  Pt has Hx of volume overload. She takes Torsemide 60 + Aldactone 25 at home. Dry weight is 214lb. (-) JVD/leg edema but (+) bibasilar fine crackles on present admission; CXR (+) interstitial edema + pulmonary vascular congestion. BNP 875.  Pt very astutely took a reduced dose of Lantus (28u, reduced from usual 40u qHS) before coming to the hospital, because she knew she would be NPO.  PAST MEDICAL HISTORY:   Past Medical History:  Diagnosis Date  . CHF (congestive heart failure) ([place])   . COPD (chronic obstructive pulmonary disease) ([place])   . Diabetes mellitus without complication ([place])   . Diabetic retinopathy ([place])   . History of placement of internal cardiac defibrillator   . Hypertension   . Hypertrophic cardiomyopathy ([place])     PAST SURGICAL HISTORY:   Past Surgical History:  Procedure Laterality Date  . CARDIAC DEFIBRILLATOR PLACEMENT    . CHOLECYSTECTOMY      SOCIAL HISTORY:   Social History   Tobacco Use  . Smoking status: Passive Smoke Exposure - Never Smoker  . Smokeless tobacco: Never Used  Substance Use Topics  . Alcohol use: No    FAMILY HISTORY:  History reviewed. No pertinent family history.  DRUG ALLERGIES:  No Known Allergies  REVIEW OF SYSTEMS:   Review of Systems  Constitutional: Positive for chills, diaphoresis and malaise/fatigue. Negative for fever and weight loss.  HENT: Positive for sore throat. Negative for congestion, ear pain, hearing loss, nosebleeds, sinus pain and tinnitus.   Eyes: Negative for blurred vision, double vision and photophobia.  Respiratory: Positive for cough, shortness of breath  and wheezing. Negative for hemoptysis and sputum production.   Cardiovascular: Positive for chest pain, orthopnea and PND. Negative for palpitations, claudication and leg swelling.  Gastrointestinal: Positive for abdominal pain, heartburn and nausea. Negative for blood in stool, constipation, diarrhea,  melena and vomiting.  Genitourinary: Negative for dysuria, frequency, hematuria and urgency.  Musculoskeletal: Positive for myalgias. Negative for back pain, joint pain and neck pain.  Skin: Negative for itching and rash.  Neurological: Positive for weakness. Negative for dizziness, tingling, tremors, sensory change, speech change, focal weakness, seizures, loss of consciousness and headaches.  Psychiatric/Behavioral: Negative for memory loss. The patient has insomnia.    MEDICATIONS AT HOME:   Prior to Admission medications   Medication Sig Start Date End Date Taking? Authorizing Provider  albuterol (PROVENTIL HFA;VENTOLIN HFA) 108 (90 Base) MCG/ACT inhaler Inhale 2 puffs into the lungs every 6 (six) hours as needed for wheezing or shortness of breath. 09/11/16  Yes Demetrios Loll, MD  aspirin EC 81 MG tablet Take 81 mg by mouth daily.   Yes [provider]  atorvastatin (LIPITOR) 20 MG tablet Take 20 mg by mouth daily. 05/03/16 04/17/18 Yes [provider]  benzonatate (TESSALON) 100 MG capsule Take 1 capsule (100 mg total) by mouth 3 (three) times daily as needed for cough. 09/11/16  Yes Demetrios Loll, MD  carvedilol (COREG) 25 MG tablet Take 25 mg by mouth 2 (two) times daily with a meal.   Yes [provider]  citalopram (CELEXA) 20 MG tablet Take 20 mg by mouth daily. 05/03/16 04/17/18 Yes [provider]  insulin aspart (NOVOLOG FLEXPEN) 100 UNIT/ML FlexPen Inject 12 Units into the skin 3 (three) times daily. 08/17/16  Yes [provider]  insulin glargine (LANTUS) 100 UNIT/ML injection Inject 40 Units into the skin at bedtime. 04/02/16  Yes [provider]  losartan (COZAAR) 100 MG tablet Take 100 mg by mouth daily. 09/01/16 04/17/18 Yes [provider]  metFORMIN (GLUCOPHAGE) 850 MG tablet Take 850 mg by mouth 3 (three) times daily. 05/03/16 04/17/18 Yes [provider]  Metoprolol Tartrate 75 MG TABS Take 75 mg by mouth 2 (two) times  daily. 09/11/16  Yes Demetrios Loll, MD  spironolactone (ALDACTONE) 25 MG tablet Take 25 mg by mouth daily.   Yes [provider]  torsemide (DEMADEX) 20 MG tablet Take 60 mg by mouth daily.   Yes [provider]  traZODone (DESYREL) 150 MG tablet Take 150 mg by mouth at bedtime. 04/02/16  Yes [provider]  verapamil (CALAN-SR) 240 MG CR tablet Take 240 mg by mouth 2 (two) times daily. 05/03/16 04/17/18 Yes [provider]  VICTOZA 18 MG/3ML SOPN Inject 0.6 mg into the skin daily. 02/27/18  Yes [provider]  bumetanide (BUMEX) 1 MG tablet Take 1 mg by mouth 2 (two) times daily. 08/31/16 09/30/16  [provider]  predniSONE (DELTASONE) 10 MG tablet 30 mg po daily for 2 days, 20 mg po daily for 2 days, 10 mg po daily for 2 days. Patient not taking: Reported on 04/17/2018 09/11/16   Demetrios Loll, MD      VITAL SIGNS:  Blood pressure (!) 155/79, pulse 74, temperature 98.7 F (37.1 C), temperature source Oral, resp. rate (!) 22, height 5\' 4"  (1.626 m), weight 97.1 kg, SpO2 94 %.  PHYSICAL EXAMINATION:  Physical Exam  Constitutional: She is oriented to person, place, and time. She appears well-developed and well-nourished. She is active and cooperative.  Non-toxic appearance. She does not  have a sickly appearance. She does not appear ill. No distress. She is not intubated. Nasal cannula in place.  HENT:  Head: Normocephalic and atraumatic.  Mouth/Throat: Oropharynx is clear and moist. No oropharyngeal exudate or posterior oropharyngeal edema.  Eyes: Conjunctivae, EOM and lids are normal. No scleral icterus.  Neck: Neck supple. No JVD present. No thyromegaly present.  Cardiovascular: Normal rate, regular rhythm, S1 normal and S2 normal.  No extrasystoles are present. Exam reveals no gallop, no S3, no S4, no distant heart sounds and no friction rub.  Murmur heard.  Systolic murmur is present with a grade of 2/6. Pulmonary/Chest: Effort normal. No  accessory muscle usage or stridor. Tachypnea noted. No apnea and no bradypnea. She is not intubated. No respiratory distress. She has decreased breath sounds in the right upper field, the right middle field, the right lower field, the left upper field, the left middle field and the left lower field. She has no wheezes. She has no rhonchi. She has rales in the right lower field and the left lower field.  Abdominal: Soft. Bowel sounds are normal. She exhibits no distension. There is no tenderness. There is no rigidity, no rebound and no guarding.  Musculoskeletal: Normal range of motion. She exhibits no edema or tenderness.       Right lower leg: Normal. She exhibits no tenderness and no edema.       Left lower leg: Normal. She exhibits no tenderness and no edema.  Lymphadenopathy:    She has no cervical adenopathy.  Neurological: She is alert and oriented to person, place, and time. She is not disoriented.  Skin: Skin is warm and dry. No rash noted. She is not diaphoretic. No erythema.  Psychiatric: She has a normal mood and affect. Her behavior is normal. Judgment and thought content normal. Her mood appears not anxious. She is not agitated.   LABORATORY PANEL:   CBC Recent Labs  Lab 04/17/18 2224  WBC 11.3*  HGB 11.5*  HCT 34.4*  PLT 248   ------------------------------------------------------------------------------------------------------------------  Chemistries  Recent Labs  Lab 04/17/18 2224  NA 140  K 3.9  CL 110  CO2 24  GLUCOSE 181*  BUN 11  CREATININE 0.60  CALCIUM 8.9  MG 1.6*  AST 22  ALT 18  ALKPHOS 61  BILITOT 0.7   ------------------------------------------------------------------------------------------------------------------  Cardiac Enzymes Recent Labs  Lab 04/17/18 2224  TROPONINI 0.45*   ------------------------------------------------------------------------------------------------------------------  RADIOLOGY:  Dg Chest 2 View  Result  Date: 04/17/2018 CLINICAL DATA:  Dyspnea worsening over the past few days. EXAM: CHEST - 2 VIEW COMPARISON:  09/09/2016 FINDINGS: Stable cardiomegaly with aortic atherosclerosis. Diffuse bilateral vascular congestion and interstitial edema compatible with CHF. Superimposed pneumonia especially in the right upper and lower lobes cannot be entirely excluded given more confluent opacities noted. ICD device with leads in the right atrium and right ventricle appear stable. No acute osseous abnormality. No pleural effusion or pneumothorax. IMPRESSION: Stable cardiomegaly with aortic atherosclerosis. Central vascular and interstitial edema is identified. Slightly more confluent opacities in the right upper and lower lobes more likely are secondary to CHF. Superimposed pneumonia would be difficult to entirely exclude. Electronically Signed   By: Ashley Royalty M.D.   On: 04/17/2018 22:44   IMPRESSION AND PLAN:   A/P: 61F p/w 2d Hx SOB/hypoxia + atypical CP. Trop-I 0.45, BNP 875. CXR (+) interstitial edema + pulmonary vascular congestion. Hyperglycemia (w/ T2IDDM), hypomagnesemia, normocytic anemia. -SOB/hypoxia, atypical CP, Trop-I elevation, BNP elevation: Lowest SpO2 88% on RA,  mild hypoxemic respiratory failure, stable SpO2 on 2L [place]. Endorses orthopnea + PND. (-) JVD/leg edema, (+) diminished breath sounds + bibasilar fine crackles, (-) rhonchi/wheezing. BNP elevated. CXR (+) edema + vascular congestion. Suspect mild acute on chronic exacerbation of HFpEF. Received IV Lasix in ED, c/w home Torsemide + Aldactone. I&O, daily weight, free water restricted diet. Documented Hx Trop-I leak, persistent elevation to 0.20 on prior admission (08/2016), Trop-I 0.45 on present admission. Atypical CP w/ pleuritic and positional components. Will evaluate for pericarditis, pneumonia, PE, ACS. Tele, cardiac monitoring, pulse ox. Trend Trop-I. EKG. CTA chest. Echo. Heparin gtt. ASA, morphine, NTG, O2. C/w statin, beta blocker, ARB.  Cardiology consult. NPO for now. CXR compared to prior, possible RLL infiltrate, SIRS (+) (2/2 tachypnea, hypoxia), however pt afebrile, WBC < 12.0, does not appear septic/toxic. Respiratory viral panel, lactate and PCT pending, pt not ordered for ABx at present. (-) wheezing, (-) acute COPD exacerbation. -Hyperglycemia/T2IDDM: SSI. Lantus 25u qHS (reduced dose from home 40u, NPO). Hold Metformin, Victoza. -Hypomagnesemia: Replete. K+ WNL. -Normocytic anemia: Likely anemia of chronic disease. No evidence of acute blood loss at present time. -c/w other home meds. -FEN/GI: NPO for now. -DVT PPx: Heparin gtt. -Code status: Full code. -Disposition: Admission, > 2 midnights.   All the records are reviewed and case discussed with ED provider. Management plans discussed with the patient, family and they are in agreement.  CODE STATUS: Full code.  TOTAL TIME TAKING CARE OF THIS PATIENT: 75 minutes.    Arta Silence M.D on 04/18/2018 at 1:06 AM  Between 7am to 6pm - Pager - [phone]  After 6pm go to [email or web address] - Proofreader  [facility]  Office  [phone]  CC: Primary care physician; [facility]   Note: This dictation was prepared with Dragon dictation along with smaller phrase technology. Any transcriptional errors that result from this process are unintentional.

## 2018-04-18 NOTE — Progress Notes (Signed)
CT called and reported that they blew the 20 G AC IV.  Will let docs know.

## 2018-04-18 NOTE — Progress Notes (Signed)
ANTICOAGULATION CONSULT NOTE - Initial Consult  Pharmacy Consult for heparin drip Indication: chest pain/ACS  No Known Allergies  Patient Measurements: Height: 5\' 4"  (162.6 cm) Weight: 214 lb (97.1 kg) IBW/kg (Calculated) : 54.7 Heparin Dosing Weight: 77 kg  Vital Signs: Temp: 98.7 F (37.1 C) (09/02 2215) Temp Source: Oral (09/02 2215) BP: 147/64 (09/02 2301) Pulse Rate: 83 (09/02 2301)  Labs: Recent Labs    04/17/18 2224  HGB 11.5*  HCT 34.4*  PLT 248  CREATININE 0.60  TROPONINI 0.45*    Estimated Creatinine Clearance: 85.7 mL/min (by C-G formula based on SCr of 0.6 mg/dL).   Medical History: Past Medical History:  Diagnosis Date  . CHF (congestive heart failure) ([place])   . COPD (chronic obstructive pulmonary disease) ([place])   . Diabetes mellitus without complication ([place])   . Diabetic retinopathy ([place])   . History of placement of internal cardiac defibrillator   . Hypertension   . Hypertrophic cardiomyopathy ([facility])     Medications:  No anticoagulation in PTA meds.  Assessment: Trop 0.45  Goal of Therapy:  Heparin level 0.3-0.7 units/ml Monitor platelets by anticoagulation protocol: Yes   Plan:  4000 unit bolus and initial rate of 900 units/hr. First heparin level 6 hours after start of infusion.  Taytum Scheck S 04/18/2018,12:23 AM

## 2018-04-18 NOTE — Progress Notes (Signed)
Patient to CT with transport.

## 2018-04-19 ENCOUNTER — Inpatient Hospital Stay: Payer: BC Managed Care – PPO

## 2018-04-19 DIAGNOSIS — E079 Disorder of thyroid, unspecified: Secondary | ICD-10-CM

## 2018-04-19 LAB — BASIC METABOLIC PANEL
Anion gap: 8 (ref 5–15)
BUN: 23 mg/dL — AB (ref 6–20)
CALCIUM: 8.3 mg/dL — AB (ref 8.9–10.3)
CO2: 25 mmol/L (ref 22–32)
CREATININE: 0.89 mg/dL (ref 0.44–1.00)
Chloride: 105 mmol/L (ref 98–111)
GFR calc Af Amer: >60 mL/min (ref >60)
GLUCOSE: 261 mg/dL — AB (ref 70–99)
Potassium: 4.1 mmol/L (ref 3.5–5.1)
SODIUM: 138 mmol/L (ref 135–145)

## 2018-04-19 LAB — GLUCOSE, CAPILLARY
GLUCOSE-CAPILLARY: 226 mg/dL — AB (ref 70–99)
GLUCOSE-CAPILLARY: 314 mg/dL — AB (ref 70–99)
Glucose-Capillary: 222 mg/dL — ABNORMAL HIGH (ref 70–99)
Glucose-Capillary: 270 mg/dL — ABNORMAL HIGH (ref 70–99)

## 2018-04-19 LAB — TSH: TSH: 0.351 u[IU]/mL (ref 0.350–4.500)

## 2018-04-19 LAB — MAGNESIUM: MAGNESIUM: 1.9 mg/dL (ref 1.7–2.4)

## 2018-04-19 LAB — HIV ANTIBODY (ROUTINE TESTING W REFLEX): HIV SCREEN 4TH GENERATION: NONREACTIVE

## 2018-04-19 MED ORDER — INSULIN GLARGINE 100 UNIT/ML ~~LOC~~ SOLN
40.0000 [IU] | Freq: Every day | SUBCUTANEOUS | Status: DC
  Administered 2018-04-19: 40 [IU] via SUBCUTANEOUS
  Filled 2018-04-19 (×2): qty 0.4

## 2018-04-19 MED ORDER — GUAIFENESIN-DM 100-10 MG/5ML PO SYRP
5.0000 mL | ORAL_SOLUTION | ORAL | Status: DC | PRN
  Administered 2018-04-19 (×2): 5 mL via ORAL
  Filled 2018-04-19 (×2): qty 5

## 2018-04-19 NOTE — Progress Notes (Signed)
Inpatient Diabetes Program Recommendations  AACE/ADA: New Consensus Statement on Inpatient Glycemic Control (2015)  Target Ranges:  Prepandial:   less than 140 mg/dL      Peak postprandial:   less than 180 mg/dL (1-2 hours)      Critically ill patients:  140 - 180 mg/dL   Lab Results  Component Value Date   GLUCAP 222 (H) 04/19/2018   HGBA1C 8.0 (H) 09/05/2016    Review of Glycemic ControlResults for ALYLAH, BLAKNEY (MRN [id number]) as of 04/19/2018 09:15  Ref. Range 04/18/2018 11:56 04/18/2018 17:23 04/18/2018 20:46 04/19/2018 07:50  Glucose-Capillary Latest Ref Range: 70 - 99 mg/dL 245 (H) 296 (H) 291 (H) 222 (H)    Diabetes history: Type 2 DM Outpatient Diabetes medications:  Novolog 12 units tid with meals, Lantus 40 units q HS Current orders for Inpatient glycemic control:  Novolog moderate tid with meals and HS, Lantus 25 units q HS Inpatient Diabetes Program Recommendations:   Please consider increasing Lantus 35 units q HS. Also consider adding Novolog meal coverage 6 units tid with meals?  Thanks,  Adah Perl, RN, BC-ADM Inpatient Diabetes Coordinator Pager [phone] (8a-5p)

## 2018-04-19 NOTE — Progress Notes (Signed)
Patient complaining of cough, standing order for Robitussin DM placed.

## 2018-04-19 NOTE — Plan of Care (Signed)
Nutrition Education Note   RD consulted for nutrition education regarding diabetes.   Lab Results  Component Value Date   HGBA1C 8.0 (H) 09/05/2016    RD provided "Nutrition and Type II Diabetes" handout from the Academy of Nutrition and Dietetics. Discussed different food groups and their effects on blood sugar, emphasizing carbohydrate-containing foods. Provided list of carbohydrates and recommended serving sizes of common foods.  Discussed importance of controlled and consistent carbohydrate intake throughout the day. Provided examples of ways to balance meals/snacks and encouraged intake of high-fiber, whole grain complex carbohydrates. Teach back method used.  Expect good compliance.  Body mass index is 38.17 kg/m. Pt meets criteria for obesity based on current BMI.  Current diet order is carbohydrate modified, patient is consuming approximately 100% of meals at this time. Labs and medications reviewed. No further nutrition interventions warranted at this time. RD contact information provided. If additional nutrition issues arise, please re-consult RD.  Koleen Distance MS, RD, LDN Pager #- [phone] Office#- [phone] After Hours Pager: [phone]

## 2018-04-19 NOTE — Progress Notes (Signed)
[place] at [place] NAME: Julie Bender    MR#:  [id number]  DATE OF BIRTH:  1960-11-28  SUBJECTIVE:  CHIEF COMPLAINT:   Chief Complaint  Patient presents with  . Shortness of Breath  no complaints, no events overnight  REVIEW OF SYSTEMS:  CONSTITUTIONAL: No fever, fatigue or weakness.  EYES: No blurred or double vision.  EARS, NOSE, AND THROAT: No tinnitus or ear pain.  RESPIRATORY: No cough, shortness of breath, wheezing or hemoptysis.  CARDIOVASCULAR: No chest pain, orthopnea, edema.  GASTROINTESTINAL: No nausea, vomiting, diarrhea or abdominal pain.  GENITOURINARY: No dysuria, hematuria.  ENDOCRINE: No polyuria, nocturia,  HEMATOLOGY: No anemia, easy bruising or bleeding SKIN: No rash or lesion. MUSCULOSKELETAL: No joint pain or arthritis.   NEUROLOGIC: No tingling, numbness, weakness.  PSYCHIATRY: No anxiety or depression.   ROS  DRUG ALLERGIES:  No Known Allergies  VITALS:  Blood pressure 127/68, pulse 64, temperature 98.3 F (36.8 C), temperature source Oral, resp. rate 18, height 5\' 4"  (1.626 m), weight 100.9 kg, SpO2 98 %.  PHYSICAL EXAMINATION:  GENERAL:  57 y.o.-year-old patient lying in the bed with no acute distress.  EYES: Pupils equal, round, reactive to light and accommodation. No scleral icterus. Extraocular muscles intact.  HEENT: Head atraumatic, normocephalic. Oropharynx and nasopharynx clear.  NECK:  Supple, no jugular venous distention. No thyroid enlargement, no tenderness.  LUNGS: Normal breath sounds bilaterally, no wheezing, rales,rhonchi or crepitation. No use of accessory muscles of respiration.  CARDIOVASCULAR: S1, S2 normal. No murmurs, rubs, or gallops.  ABDOMEN: Soft, nontender, nondistended. Bowel sounds present. No organomegaly or mass.  EXTREMITIES: No pedal edema, cyanosis, or clubbing.  NEUROLOGIC: Cranial nerves II through XII are intact. Muscle strength 5/5 in all extremities. Sensation  intact. Gait not checked.  PSYCHIATRIC: The patient is alert and oriented x 3.  SKIN: No obvious rash, lesion, or ulcer.   Physical Exam LABORATORY PANEL:   CBC Recent Labs  Lab 04/18/18 0928  WBC 10.1  HGB 11.3*  HCT 33.9*  PLT 254   ------------------------------------------------------------------------------------------------------------------  Chemistries  Recent Labs  Lab 04/17/18 2224 04/19/18 0439  NA 140 138  K 3.9 4.1  CL 110 105  CO2 24 25  GLUCOSE 181* 261*  BUN 11 23*  CREATININE 0.60 0.89  CALCIUM 8.9 8.3*  MG 1.6* 1.9  AST 22  --   ALT 18  --   ALKPHOS 61  --   BILITOT 0.7  --    ------------------------------------------------------------------------------------------------------------------  Cardiac Enzymes Recent Labs  Lab 04/18/18 0231 04/18/18 0532  TROPONINI 0.39* 0.34*   ------------------------------------------------------------------------------------------------------------------  RADIOLOGY:  Dg Chest 2 View  Result Date: 04/17/2018 CLINICAL DATA:  Dyspnea worsening over the past few days. EXAM: CHEST - 2 VIEW COMPARISON:  09/09/2016 FINDINGS: Stable cardiomegaly with aortic atherosclerosis. Diffuse bilateral vascular congestion and interstitial edema compatible with CHF. Superimposed pneumonia especially in the right upper and lower lobes cannot be entirely excluded given more confluent opacities noted. ICD device with leads in the right atrium and right ventricle appear stable. No acute osseous abnormality. No pleural effusion or pneumothorax. IMPRESSION: Stable cardiomegaly with aortic atherosclerosis. Central vascular and interstitial edema is identified. Slightly more confluent opacities in the right upper and lower lobes more likely are secondary to CHF. Superimposed pneumonia would be difficult to entirely exclude. Electronically Signed   By: Ashley Royalty M.D.   On: 04/17/2018 22:44   Ct Angio Chest Pe W Or Wo Contrast  Result  Date: 04/18/2018 CLINICAL DATA:  Shortness of breath.  Chest pain. EXAM: CT ANGIOGRAPHY CHEST WITH CONTRAST TECHNIQUE: Multidetector CT imaging of the chest was performed using the standard protocol during bolus administration of intravenous contrast. Multiplanar CT image reconstructions and MIPs were obtained to evaluate the vascular anatomy. CONTRAST:  42mL OMNIPAQUE IOHEXOL 350 MG/ML SOLN COMPARISON:  Radiographs of April 17, 2018. CT scan of September 07, 2017. FINDINGS: Cardiovascular: Satisfactory opacification of the pulmonary arteries to the segmental level. No evidence of pulmonary embolism. Mild cardiomegaly is noted. No pericardial effusion. Mediastinum/Nodes: 3.1 cm left thyroid mass is noted. Esophagus is unremarkable. No significant adenopathy is noted. Lungs/Pleura: No pneumothorax is noted. Minimal right pleural effusion is noted. Minimal bibasilar subsegmental atelectasis is noted. Upper Abdomen: No acute abnormality. Musculoskeletal: No chest wall abnormality. No acute or significant osseous findings. Review of the MIP images confirms the above findings. IMPRESSION: No definite evidence of pulmonary embolus. 3.1 cm left thyroid mass. Thyroid ultrasound is recommended for further evaluation. Minimal bibasilar subsegmental atelectasis. Minimal right pleural effusion. Electronically Signed   By: Marijo Conception, M.D.   On: 04/18/2018 10:58    ASSESSMENT AND PLAN:  *Acute hypoxic Respiratory failure Due to acute on OSA -for which patient is noncompliant with obtaining device primarily due to cost, acute on chronic diastolic congestive heart failure exacerbation, and extreme morbid obesity  Resolving  Continue aspirin, Coreg, losartan, spironolactone, torsemide, encourage activity/ambulation, cardiology input greatly appreciated, possible discharge to home on tomorrow,  Echo cardia Phillip Heal noted for normal ejection fraction  CT chest noted  *Acute elevated troponins  Acute coronary syndrome  ruled out  Most likely due to demand ischemia, cardiology input greatly appreciated   *Acute abnormal CT of the chest  Noted for incidentally found thyroid lesion-3.1 cm  Check TSH and thyroid ultrasound for further evaluation  We will need to follow-up with general surgery status post discharge for further care/evaluation for possible biopsy   *Acute atypical chest pain  Resolved Prior heart cath was negative  *Chronic DM, II Stable on current regiment  *Chronic OSA Non-compliant with device due to cost Cpap while in house  *Chronic Morbid obesity Lifestyle modification recommended  All the records are reviewed and case discussed with Care Management/Social Workerr. Management plans discussed with the patient, family and they are in agreement.  CODE STATUS: full  TOTAL TIME TAKING CARE OF THIS PATIENT: 40 minutes.     POSSIBLE D/C IN 1 DAYS, DEPENDING ON CLINICAL CONDITION.   Avel Peace Salary M.D on 04/19/2018   Between 7am to 6pm - Pager - [phone]  After 6pm go to [email or web address] - password [email or web address]  Office  [phone]  CC: Primary care physician; [facility]  Note: This dictation was prepared with Dragon dictation along with smaller phrase technology. Any transcriptional errors that result from this process are unintentional.

## 2018-04-19 NOTE — Plan of Care (Signed)

## 2018-04-19 NOTE — Care Management Note (Signed)
Case Management Note  Patient Details  Name: Julie Bender MRN: [id number] Date of Birth: 04-23-1961  Subjective/Objective:         Attempted to assess patient and she os currently off unit.           Action/Plan:   Expected Discharge Date:                  Expected Discharge Plan:     In-House Referral:     Discharge planning Services     Post Acute Care Choice:    Choice offered to:     DME Arranged:    DME Agency:     HH Arranged:    HH Agency:     Status of Service:     If discussed at [employer] of Stay Meetings, dates discussed:    Additional Comments:  Elza Rafter, RN 04/19/2018, 4:56 PM

## 2018-04-19 NOTE — Progress Notes (Signed)
Progress Note  Patient Name: Julie Bender Date of Encounter: 04/19/2018  Primary Cardiologist: followed at [place] continued shortness of breath on exertion today Chest pain seems to have resolved Eating well, no significant abdominal pain  Long discussion with her concerning recent events, Has been eating out fast food restaurants for the past month as she has been packing, high salt intake Also feels that she overdid it with her move to [place], moving heavy boxes was too much All the boxes are sitting in one place in her house and has difficulty getting them to her the need to be to unpack  Inpatient Medications    Scheduled Meds: . aspirin  81 mg Oral Daily  . atorvastatin  40 mg Oral q1800  . carvedilol  25 mg Oral BID WC  . citalopram  20 mg Oral Daily  . enoxaparin (LOVENOX) injection  40 mg Subcutaneous Q24H  . insulin aspart  0-15 Units Subcutaneous TID WC  . insulin aspart  0-5 Units Subcutaneous QHS  . insulin glargine  25 Units Subcutaneous QHS  . losartan  100 mg Oral Daily  . spironolactone  25 mg Oral Daily  . torsemide  60 mg Oral Daily  . verapamil  240 mg Oral BID   Continuous Infusions: . famotidine (PEPCID) IV 20 mg (04/19/18 0818)   PRN Meds: acetaminophen, albuterol, alum & mag hydroxide-simeth, bisacodyl, guaiFENesin-dextromethorphan, morphine injection, nitroGLYCERIN, ondansetron (ZOFRAN) IV, senna-docusate, traZODone   Vital Signs    Vitals:   04/19/18 0432 04/19/18 0523 04/19/18 0557 04/19/18 0749  BP:    127/68  Pulse:    64  Resp:      Temp:    98.3 F (36.8 C)  TempSrc:    Oral  SpO2: 100% 97% 98% 98%  Weight:      Height:        Intake/Output Summary (Last 24 hours) at 04/19/2018 1024 Last data filed at 04/19/2018 0753 Gross per 24 hour  Intake 38.74 ml  Output 1300 ml  Net -1261.26 ml   Filed Weights   04/17/18 2220 04/18/18 0210 04/19/18 0336  Weight: 97.1 kg 101.4 kg 100.9 kg    Telemetry      Normal sinus rhythm- Personally Reviewed  ECG    - Personally Reviewed  Physical Exam   Constitutional:  oriented to person, place, and time. No distress. Obese HENT:  Head: Normocephalic and atraumatic.  Eyes:  no discharge. No scleral icterus.  Neck: Normal range of motion. Neck supple. No JVD present.  Cardiovascular: Normal rate, regular rhythm, normal heart sounds and intact distal pulses. Exam reveals no gallop and no friction rub. No edema No murmur heard. Pulmonary/Chest: Effort normal and breath sounds normal. No stridor. No respiratory distress.  no wheezes.  no rales.  no tenderness.  Abdominal: Soft.  no distension.  no tenderness.  Musculoskeletal: Normal range of motion.  no  tenderness or deformity.  Neurological:  normal muscle tone. Coordination normal. No atrophy Skin: Skin is warm and dry. No rash noted. not diaphoretic.  Psychiatric:  normal mood and affect. behavior is normal. Thought content normal.    Labs    Chemistry Recent Labs  Lab 04/17/18 2224 04/19/18 0439  NA 140 138  K 3.9 4.1  CL 110 105  CO2 24 25  GLUCOSE 181* 261*  BUN 11 23*  CREATININE 0.60 0.89  CALCIUM 8.9 8.3*  PROT 6.8  --   ALBUMIN 3.7  --  AST 22  --   ALT 18  --   ALKPHOS 61  --   BILITOT 0.7  --   GFRNONAA >60 >60  GFRAA >60 >60  ANIONGAP 6 8     Hematology Recent Labs  Lab 04/17/18 2224 04/18/18 0928  WBC 11.3* 10.1  RBC 4.17 4.11  HGB 11.5* 11.3*  HCT 34.4* 33.9*  MCV 82.6 82.6  MCH 27.6 27.4  MCHC 33.4 33.2  RDW 15.7* 15.4*  PLT 248 254    Cardiac Enzymes Recent Labs  Lab 04/17/18 2224 04/18/18 0231 04/18/18 0532  TROPONINI 0.45* 0.39* 0.34*   No results for input(s): TROPIPOC in the last 168 hours.   BNP Recent Labs  Lab 04/17/18 2224  BNP 875.0*     DDimer No results for input(s): DDIMER in the last 168 hours.   Radiology    Dg Chest 2 View  Result Date: 04/17/2018 CLINICAL DATA:  Dyspnea worsening over the past few days. EXAM:  CHEST - 2 VIEW COMPARISON:  09/09/2016 FINDINGS: Stable cardiomegaly with aortic atherosclerosis. Diffuse bilateral vascular congestion and interstitial edema compatible with CHF. Superimposed pneumonia especially in the right upper and lower lobes cannot be entirely excluded given more confluent opacities noted. ICD device with leads in the right atrium and right ventricle appear stable. No acute osseous abnormality. No pleural effusion or pneumothorax. IMPRESSION: Stable cardiomegaly with aortic atherosclerosis. Central vascular and interstitial edema is identified. Slightly more confluent opacities in the right upper and lower lobes more likely are secondary to CHF. Superimposed pneumonia would be difficult to entirely exclude. Electronically Signed   By: Ashley Royalty M.D.   On: 04/17/2018 22:44   Ct Angio Chest Pe W Or Wo Contrast  Result Date: 04/18/2018 CLINICAL DATA:  Shortness of breath.  Chest pain. EXAM: CT ANGIOGRAPHY CHEST WITH CONTRAST TECHNIQUE: Multidetector CT imaging of the chest was performed using the standard protocol during bolus administration of intravenous contrast. Multiplanar CT image reconstructions and MIPs were obtained to evaluate the vascular anatomy. CONTRAST:  63mL OMNIPAQUE IOHEXOL 350 MG/ML SOLN COMPARISON:  Radiographs of April 17, 2018. CT scan of September 07, 2017. FINDINGS: Cardiovascular: Satisfactory opacification of the pulmonary arteries to the segmental level. No evidence of pulmonary embolism. Mild cardiomegaly is noted. No pericardial effusion. Mediastinum/Nodes: 3.1 cm left thyroid mass is noted. Esophagus is unremarkable. No significant adenopathy is noted. Lungs/Pleura: No pneumothorax is noted. Minimal right pleural effusion is noted. Minimal bibasilar subsegmental atelectasis is noted. Upper Abdomen: No acute abnormality. Musculoskeletal: No chest wall abnormality. No acute or significant osseous findings. Review of the MIP images confirms the above findings.  IMPRESSION: No definite evidence of pulmonary embolus. 3.1 cm left thyroid mass. Thyroid ultrasound is recommended for further evaluation. Minimal bibasilar subsegmental atelectasis. Minimal right pleural effusion. Electronically Signed   By: Marijo Conception, M.D.   On: 04/18/2018 10:58    Cardiac Studies   Echocardiogram with normal ejection fraction moderate LVH mild outflow tract obstruction, mild to moderate MR Echo appears improved compared to 2 years ago  Patient Profile     57 year old woman followed at [facility] cardiology normal ejection fraction, severe LVH, midcavity obliteration gradient 41 up to 62 mmHg, pulmonary hypertension, morbid obesity, significant mitral valve regurgitation, obstructive sleep apnea not treated secondary to financial issues presenting with shortness of breath  Assessment & Plan      Acute respiratory distress  morbid obesity, deconditioning, chronic diastolic CHF, obstructive sleep apnea not being treated --- pulmonary edema CT scan  with no PE, minimal pleural effusion Symptoms moderately improved Restarted on verapamil for blood pressure control Echocardiogram reassuring normal ejection fraction -could potentially give extra doses of torsemide this afternoon if still symptomatic Otherwise continue 60 daily  Elevated troponin Demand ischemia in the setting of acute respiratory distress and  LVH Prior cardiac catheterization 2 years ago with no significant coronary disease No further ischemic workup needed  Atypical chest pain Prior workup with cardiac catheterization showing no significant disease No further workup needed  Hypertrophic cardiomyopathy Secondary to poorly controlled sleep apnea, morbid obesity, hypertension Unclear if there is a genetic component Continue carvedilol torsemide spironolactone, verapamil Will likely need to limit her or strenuous activities  Diabetes Needs management with dietitian Obesity likely contributing to  all of above  Untreated sleep apnea She reports prescription was called in for CPAP to facility in [place] She has been unable to fill the prescription that she does not have $200 for her co-pay Poor control sleep apnea likely contributing to her current fluid retention Would consider case manager,  ----unclear if there is charity services that might be able to help at this hospital or [facility] as she is a [facility] patient, to help pay for her unit. This would likely cut down on the hospital readmissions   Total encounter time more than 35 minutes  Greater than 50% was spent in counseling and coordination of care with the patient   For questions or updates, please contact [place] Please consult [email or web address] for contact info under Cardiology/STEMI.      Signed, Ida Rogue, MD  04/19/2018, 10:24 AM

## 2018-04-20 DIAGNOSIS — R079 Chest pain, unspecified: Secondary | ICD-10-CM

## 2018-04-20 LAB — GLUCOSE, CAPILLARY
GLUCOSE-CAPILLARY: 284 mg/dL — AB (ref 70–99)
Glucose-Capillary: 169 mg/dL — ABNORMAL HIGH (ref 70–99)

## 2018-04-20 LAB — BASIC METABOLIC PANEL
ANION GAP: 8 (ref 5–15)
BUN: 23 mg/dL — ABNORMAL HIGH (ref 6–20)
CHLORIDE: 103 mmol/L (ref 98–111)
CO2: 28 mmol/L (ref 22–32)
Calcium: 8.4 mg/dL — ABNORMAL LOW (ref 8.9–10.3)
Creatinine, Ser: 0.76 mg/dL (ref 0.44–1.00)
GFR calc non Af Amer: >60 mL/min (ref >60)
Glucose, Bld: 214 mg/dL — ABNORMAL HIGH (ref 70–99)
POTASSIUM: 3.7 mmol/L (ref 3.5–5.1)
Sodium: 139 mmol/L (ref 135–145)

## 2018-04-20 MED ORDER — ATORVASTATIN CALCIUM 40 MG PO TABS: 40.0000 mg | ORAL_TABLET | Freq: Every day | ORAL | 0 refills | Status: DC

## 2018-04-20 MED ORDER — ALBUTEROL SULFATE HFA 108 (90 BASE) MCG/ACT IN AERS: 2.0000 | INHALATION_SPRAY | Freq: Four times a day (QID) | RESPIRATORY_TRACT | 2 refills | Status: DC | PRN

## 2018-04-20 NOTE — Progress Notes (Signed)
Progress Note  Patient Name: Julie Bender Date of Encounter: 04/20/2018  Primary Cardiologist: [facility]  Subjective   Remains SOB, unchanged. Asked to be placed back on supplemental oxygen via nasal cannula overnight. Not wearing CPAP while admitted. Documented UOP of 2.4 L for the past 24 hours, with a net - 3.3 L for the admission (no input documented). Weight down from 101.4 kg to 100.1 kg for the admission. Scr 0.60-->0.89-->0.76, K+ 3.7.   Inpatient Medications    Scheduled Meds: . aspirin  81 mg Oral Daily  . atorvastatin  40 mg Oral q1800  . carvedilol  25 mg Oral BID WC  . citalopram  20 mg Oral Daily  . enoxaparin (LOVENOX) injection  40 mg Subcutaneous Q24H  . insulin aspart  0-15 Units Subcutaneous TID WC  . insulin aspart  0-5 Units Subcutaneous QHS  . insulin glargine  40 Units Subcutaneous QHS  . losartan  100 mg Oral Daily  . spironolactone  25 mg Oral Daily  . torsemide  60 mg Oral Daily  . verapamil  240 mg Oral BID   Continuous Infusions:  PRN Meds: acetaminophen, albuterol, alum & mag hydroxide-simeth, bisacodyl, guaiFENesin-dextromethorphan, morphine injection, nitroGLYCERIN, ondansetron (ZOFRAN) IV, senna-docusate, traZODone   Vital Signs    Vitals:   04/19/18 1729 04/19/18 1929 04/20/18 0408 04/20/18 0422  BP: (!) 150/71 131/64 (!) 113/58   Pulse: 65 68 (!) 54   Resp: 18 18 18    Temp: 98 F (36.7 C) 98.4 F (36.9 C) 97.9 F (36.6 C)   TempSrc: Oral Oral Oral   SpO2: 95% 95% 98%   Weight:    100.1 kg  Height:        Intake/Output Summary (Last 24 hours) at 04/20/2018 0745 Last data filed at 04/20/2018 0408 Gross per 24 hour  Intake -  Output 2400 ml  Net -2400 ml   Filed Weights   04/18/18 0210 04/19/18 0336 04/20/18 0422  Weight: 101.4 kg 100.9 kg 100.1 kg    Telemetry    NSR to intermittent paced rhythm - Personally Reviewed  ECG    n/a - Personally Reviewed  Physical Exam   GEN: No acute distress.   Neck: No JVD. Cardiac:  RRR, no murmurs, rubs, or gallops.  Respiratory: Diminished breath sounds bilaterally.  GI: Soft, nontender, non-distended.   MS: No edema; No deformity. Neuro:  Alert and oriented x 3; Nonfocal.  Psych: Normal affect.  Labs    Chemistry Recent Labs  Lab 04/17/18 2224 04/19/18 0439 04/20/18 0449  NA 140 138 139  K 3.9 4.1 3.7  CL 110 105 103  CO2 24 25 28   GLUCOSE 181* 261* 214*  BUN 11 23* 23*  CREATININE 0.60 0.89 0.76  CALCIUM 8.9 8.3* 8.4*  PROT 6.8  --   --   ALBUMIN 3.7  --   --   AST 22  --   --   ALT 18  --   --   ALKPHOS 61  --   --   BILITOT 0.7  --   --   GFRNONAA >60 >60 >60  GFRAA >60 >60 >60  ANIONGAP 6 8 8      Hematology Recent Labs  Lab 04/17/18 2224 04/18/18 0928  WBC 11.3* 10.1  RBC 4.17 4.11  HGB 11.5* 11.3*  HCT 34.4* 33.9*  MCV 82.6 82.6  MCH 27.6 27.4  MCHC 33.4 33.2  RDW 15.7* 15.4*  PLT 248 254    Cardiac Enzymes Recent Labs  Lab 04/17/18 2224 04/18/18 0231 04/18/18 0532  TROPONINI 0.45* 0.39* 0.34*   No results for input(s): TROPIPOC in the last 168 hours.   BNP Recent Labs  Lab 04/17/18 2224  BNP 875.0*     DDimer No results for input(s): DDIMER in the last 168 hours.   Radiology    Ct Angio Chest Pe W Or Wo Contrast  Result Date: 04/18/2018 IMPRESSION: No definite evidence of pulmonary embolus. 3.1 cm left thyroid mass. Thyroid ultrasound is recommended for further evaluation. Minimal bibasilar subsegmental atelectasis. Minimal right pleural effusion. Electronically Signed   By: Marijo Conception, M.D.   On: 04/18/2018 10:58   US Thyroid  Result Date: 04/19/2018 CLINICAL DATA:  Palpable abnormality. 57 year old female with a palpable thyroid mass EXAM: THYROID ULTRASOUND TECHNIQUE: Ultrasound examination of the thyroid gland and adjacent soft tissues was performed. COMPARISON:  None. FINDINGS: Parenchymal Echotexture: Moderately heterogenous Isthmus: 1.1 cm Right lobe: 5.7 x 1.7 x 2.1 cm Left lobe: 5.1 x 2.4 x 3.2 cm  _________________________________________________________ Estimated total number of nodules >/= 1 cm: 1 Number of spongiform nodules >/=  2 cm not described below (TR1): 0 Number of mixed cystic and solid nodules >/= 1.5 cm not described below (TR2): 0 _________________________________________________________ Nodule # 3: Location: Left; Mid Maximum size: 3.8 cm; Other 2 dimensions: 2.9 x 1.4 cm Composition: solid/almost completely solid (2) Echogenicity: isoechoic (1) Shape: not taller-than-wide (0) Margins: smooth (0) Echogenic foci: none (0) ACR TI-RADS total points: 3. ACR TI-RADS risk category: TR3 (3 points). ACR TI-RADS recommendations: **Given size (>/= 2.5 cm) and appearance, fine needle aspiration of this mildly suspicious nodule should be considered based on TI-RADS criteria. _________________________________________________________ Multiple additional small subcentimeter hypoechoic cysts and nodules in the right gland which do not meet criteria for further evaluation. No suspicious lymphadenopathy. IMPRESSION: Large 3.8 cm TI-RADS category 3 nodule (labeled # 3) in the left mid gland meets criteria for consideration of fine-needle aspiration biopsy. Additional incidental tiny thyroid cysts and nodules are also present but do not meet criteria for biopsy or dedicated follow-up. The above is in keeping with the ACR TI-RADS recommendations - J Am Coll Radiol 2017;14:587-595. Electronically Signed   By: Jacqulynn Cadet M.D.   On: 04/19/2018 16:55    Cardiac Studies   2-D echo 04/18/2018: Study Conclusions  - Left ventricle: The cavity size was normal. There was moderate   concentric hypertrophy, septal wall appears to be moderate to   severe (apical region appears to be spared) with mild LVOT   obstruction. Systolic function was normal. The estimated ejection   fraction was in the range of 55% to 60%. Wall motion was normal;   there were no regional wall motion abnormalities. Features are    consistent with a pseudonormal left ventricular filling pattern,   with concomitant abnormal relaxation and increased filling   pressure (grade 2 diastolic dysfunction). - Aortic valve: There was trivial regurgitation. - Mitral valve: There was mild to moderate regurgitation. - Left atrium: The atrium was mildly dilated. - Right ventricle: Systolic function was normal. - Pulmonary arteries: Systolic pressure could not be accurately   estimated.  Impressions:  - Definity used. __________  Patient Profile     57 y.o. 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  Assessment & Plan  1. Acute respiratory distress with hypoxia: -Dyspnea persists -Suspect this is multifactorial including mild volume overload that is improved, HCM, profound physical deconditioning, obesity, OSA, and likely OHS -Wean oxygen this morning to room air -If she is to remain admitted, would consider CPAP overnight  2. Elevated troponin: -Supply demand ischemia in the setting of her LVH and acute respiratory distress  -As below  3. Atypical chest pain: -Prior LHC in 03/2016 with no significant CAD -No plans for inpatient ischemic evaluation at this time  4. Hypertrophic cardiomyopathy: -Echo stable as above -Consider genetic referral as an outpatient (defer to [facility]) -Continue Coreg, verapamil, torsemide and spironolactone  -Compliance is important -Limit strenuous activities   5. Morbid obesity: -Weight loss is advised  6. Sleep apnea: -Has been unable to afford CPAP  -Likely exacerbating her dyspnea  -Case manager is involved to assist   7. HTN: -Improved -Contributing to the above -Continue Coreg, losartan, verapamil, torsemide, and spironolactone   8. DM: -Per IM  9. Thyroid  nodules: -As above -Defer timing/scheduling/referral of FNA to IM -Patient would like to talk to IM about this on rounds today   For questions or updates, please contact [place] Please consult [email or web address] for contact info under Cardiology/STEMI.    Signed, Christell Faith, PA-C [place] Pager: [phone] 04/20/2018, 7:45 AM

## 2018-04-20 NOTE — Discharge Summary (Signed)
[facility] NAME: Julie Bender    MR#:  [id number]  DATE OF BIRTH:  1960/08/17  DATE OF ADMISSION:  04/17/2018 ADMITTING PHYSICIAN: Arta Silence, MD  DATE OF DISCHARGE: No discharge date for Julie Bender encounter.  PRIMARY CARE PHYSICIAN: [facility]    ADMISSION DIAGNOSIS:  Hypoxia [R09.02] NSTEMI (non-ST elevated myocardial infarction) ([place]) [I21.4]  DISCHARGE DIAGNOSIS:  Principal Problem:   Acute respiratory distress   SECONDARY DIAGNOSIS:   Past Medical History:  Diagnosis Date  . CHF (congestive heart failure) ([place])   . COPD (chronic obstructive pulmonary disease) ([place])   . Diabetes mellitus without complication ([place])   . Diabetic retinopathy ([place])   . History of placement of internal cardiac defibrillator   . Hypertension   . Hypertrophic cardiomyopathy [facility])     HOSPITAL COURSE:  *Acute hypoxic Respiratory failure Resolved Due to acute on OSA -for which Julie Bender is noncompliant with obtaining device primarily due to cost, acute on chronic diastolic congestive heart failure exacerbation, and extreme morbid obesity  Treated with aspirin, Coreg, losartan, spironolactone, torsemide, cardiology did see Julie Bender while in house, and Julie Bender did well  Echo noted for normal ejection fraction  CT chest noted  *Acute elevated troponins  Acute coronary syndrome ruled out  Most likely due to demand ischemia, cardiology did see Julie Bender while in house   *Acute abnormal CT of the chest  Noted for incidentally found thyroid lesion-3.1 cm  TSH was normal, thyroid ultrasound noted for 3.8 cm thyroid lesion  Will have Julie Bender follow-up with general surgery status post discharge for FNA evaluation   *Acute atypical chest pain  Resolved Prior heart cath was negative  *Chronic DM, II Stable on current regiment  *Chronic OSA Non-compliant with device due to cost Cpap while in house  *Chronic  Morbid obesity Lifestyle modification recommended   DISCHARGE CONDITIONS:   Stable  CONSULTS OBTAINED:  Treatment Team:  Arta Silence, MD Minna Merritts, MD Corey Skains, MD  DRUG ALLERGIES:  No Known Allergies  DISCHARGE MEDICATIONS:   Allergies as of 04/20/2018   No Known Allergies     Medication List    TAKE these medications   albuterol 108 (90 Base) MCG/ACT inhaler Commonly known as:  PROVENTIL HFA;VENTOLIN HFA Inhale 2 puffs into the lungs every 6 (six) hours as needed for wheezing or shortness of breath.   aspirin EC 81 MG tablet Take 81 mg by mouth daily.   atorvastatin 40 MG tablet Commonly known as:  LIPITOR Take 1 tablet (40 mg total) by mouth daily at 6 PM. What changed:    medication strength  how much to take  when to take this   benzonatate 100 MG capsule Commonly known as:  TESSALON Take 1 capsule (100 mg total) by mouth 3 (three) times daily as needed for cough.   bumetanide 1 MG tablet Commonly known as:  BUMEX Take 1 mg by mouth 2 (two) times daily.   carvedilol 25 MG tablet Commonly known as:  COREG Take 25 mg by mouth 2 (two) times daily with a meal.   citalopram 20 MG tablet Commonly known as:  CELEXA Take 20 mg by mouth daily.   LANTUS 100 UNIT/ML injection Generic drug:  insulin glargine Inject 40 Units into the skin at bedtime.   losartan 100 MG tablet Commonly known as:  COZAAR Take 100 mg by mouth daily.   metFORMIN 850 MG tablet Commonly known as:  GLUCOPHAGE  Take 850 mg by mouth 3 (three) times daily.   Metoprolol Tartrate 75 MG Tabs Take 75 mg by mouth 2 (two) times daily.   NOVOLOG FLEXPEN 100 UNIT/ML FlexPen Generic drug:  insulin aspart Inject 12 Units into the skin 3 (three) times daily.   predniSONE 10 MG tablet Commonly known as:  DELTASONE 30 mg po daily for 2 days, 20 mg po daily for 2 days, 10 mg po daily for 2 days.   spironolactone 25 MG tablet Commonly known as:  ALDACTONE Take  25 mg by mouth daily.   torsemide 20 MG tablet Commonly known as:  DEMADEX Take 60 mg by mouth daily.   traZODone 150 MG tablet Commonly known as:  DESYREL Take 150 mg by mouth at bedtime.   verapamil 240 MG CR tablet Commonly known as:  CALAN-SR Take 240 mg by mouth 2 (two) times daily.   VICTOZA 18 MG/3ML Sopn Generic drug:  liraglutide Inject 0.6 mg into the skin daily.        DISCHARGE INSTRUCTIONS:  if you experience worsening of your admission symptoms, develop shortness of breath, life threatening emergency, suicidal or homicidal thoughts you must seek medical attention immediately by calling 911 or calling your MD immediately  if symptoms less severe.  You Must read complete instructions/literature along with all the possible adverse reactions/side effects for all the Medicines you take and that have been prescribed to you. Take any new Medicines after you have completely understood and accept all the possible adverse reactions/side effects.   Please note  You were cared for by a hospitalist during your hospital stay. If you have any questions about your discharge medications or the care you received while you were in the hospital after you are discharged, you can call the unit and asked to speak with the hospitalist on call if the hospitalist that took care of you is not available. Once you are discharged, your primary care physician will handle any further medical issues. Please note that NO REFILLS for any discharge medications will be authorized once you are discharged, as it is imperative that you return to your primary care physician (or establish a relationship with a primary care physician if you do not have one) for your aftercare needs so that they can reassess your need for medications and monitor your lab values.    Today   CHIEF COMPLAINT:   Chief Complaint  Julie Bender presents with  . Shortness of Breath    HISTORY OF PRESENT ILLNESS:  57 y.o. female with  a known history of T2IDDM (w/ retinopathy), HTN, HLD, COPD, HOCM (EF 70% w/ HOCM + mod MR as of 05/2017 Echo), s/p Medtronic AICD/PPM, Troponin leak p/w SOB, Trop-I elevation. Pt states that she was feeling reasonably well until ~2d ago. She states she developed acute-onset SOB, initially mild, but progressively worsening. She states the first thing she noticed was that she needed to rest more frequently while walking inside the house. In addition to decreased exercise tolerance, she also endorses orthopnea, PND and insomnia. She denies leg edema. She endorses non-productive dry cough x2d, as well as subjective wheezing (though she does not exhibit wheezing on exam). She endorses chest pain, which she characterizes as "intermittent twinges" of sharp pain in her L upper chest, worsening w/ cough and deep inspiration. She states the discomfort increases when lying back, and improves when leaning forward. She endorses chills, diaphoresis and myalgias, but denies fever and rigors. She endorses sore throat, heartburn/indigestion, nausea and  epigastric AP.  Pt had cardiac catheterization @ [facility] in 03/2016. (-) CAD, end-diastolic pressure 40 mmHg, wedge pressure 25 mm Hg, mean pulmonary pressure 41 mm Hg, mid-cavity gradient 40-60 mm Hg, (-) systolic anterior motion of the mitral valve. [facility] ([place]) on 09/29/2017. She has a documented Hx of Troponin leak/baseline elevation. She had a persistent Trop-I elevation of ~0.20 on prior admission in 08/2016. Trop-I on present admission 0.45. (-) AICD shocks.  Pt has Hx of volume overload. She takes Torsemide 60 + Aldactone 25 at home. Dry weight is 214lb. (-) JVD/leg edema but (+) bibasilar fine crackles on present admission; CXR (+) interstitial edema + pulmonary vascular congestion. BNP 875.  Pt very astutely took a reduced dose of Lantus (28u, reduced from usual 40u qHS) before coming to the hospital, because she knew she would be NPO.  VITAL  SIGNS:  Blood pressure (!) 114/100, pulse (!) 55, temperature 97.9 F (36.6 C), temperature source Oral, resp. rate 19, height 5\' 4"  (1.626 m), weight 100.1 kg, SpO2 96 %.  I/O:    Intake/Output Summary (Last 24 hours) at 04/20/2018 1136 Last data filed at 04/20/2018 0845 Gross per 24 hour  Intake -  Output 1900 ml  Net -1900 ml    PHYSICAL EXAMINATION:  GENERAL:  57 y.o.-year-old Julie Bender lying in the bed with no acute distress.  EYES: Pupils equal, round, reactive to light and accommodation. No scleral icterus. Extraocular muscles intact.  HEENT: Head atraumatic, normocephalic. Oropharynx and nasopharynx clear.  NECK:  Supple, no jugular venous distention. No thyroid enlargement, no tenderness.  LUNGS: Normal breath sounds bilaterally, no wheezing, rales,rhonchi or crepitation. No use of accessory muscles of respiration.  CARDIOVASCULAR: S1, S2 normal. No murmurs, rubs, or gallops.  ABDOMEN: Soft, non-tender, non-distended. Bowel sounds present. No organomegaly or mass.  EXTREMITIES: No pedal edema, cyanosis, or clubbing.  NEUROLOGIC: Cranial nerves II through XII are intact. Muscle strength 5/5 in all extremities. Sensation intact. Gait not checked.  PSYCHIATRIC: The Julie Bender is alert and oriented x 3.  SKIN: No obvious rash, lesion, or ulcer.   DATA REVIEW:   CBC Recent Labs  Lab 04/18/18 0928  WBC 10.1  HGB 11.3*  HCT 33.9*  PLT 254    Chemistries  Recent Labs  Lab 04/17/18 2224 04/19/18 0439 04/20/18 0449  NA 140 138 139  K 3.9 4.1 3.7  CL 110 105 103  CO2 24 25 28   GLUCOSE 181* 261* 214*  BUN 11 23* 23*  CREATININE 0.60 0.89 0.76  CALCIUM 8.9 8.3* 8.4*  MG 1.6* 1.9  --   AST 22  --   --   ALT 18  --   --   ALKPHOS 61  --   --   BILITOT 0.7  --   --     Cardiac Enzymes Recent Labs  Lab 04/18/18 0532  TROPONINI 0.34*    Microbiology Results  Results for orders placed or performed during the hospital encounter of 09/04/16  MRSA PCR Screening      Status: None   Collection Time: 09/08/16 12:02 AM  Result Value Ref Range Status   MRSA by PCR NEGATIVE NEGATIVE Final    Comment:        The GeneXpert MRSA Assay (FDA approved for NASAL specimens only), is one component of a comprehensive MRSA colonization surveillance program. It is not intended to diagnose MRSA infection nor to guide or monitor treatment for MRSA infections.     RADIOLOGY:  US  Thyroid  Result Date: 04/19/2018 CLINICAL DATA:  Palpable abnormality. 57 year old female with a palpable thyroid mass EXAM: THYROID ULTRASOUND TECHNIQUE: Ultrasound examination of the thyroid gland and adjacent soft tissues was performed. COMPARISON:  None. FINDINGS: Parenchymal Echotexture: Moderately heterogenous Isthmus: 1.1 cm Right lobe: 5.7 x 1.7 x 2.1 cm Left lobe: 5.1 x 2.4 x 3.2 cm _________________________________________________________ Estimated total number of nodules >/= 1 cm: 1 Number of spongiform nodules >/=  2 cm not described below (TR1): 0 Number of mixed cystic and solid nodules >/= 1.5 cm not described below (TR2): 0 _________________________________________________________ Nodule # 3: Location: Left; Mid Maximum size: 3.8 cm; Other 2 dimensions: 2.9 x 1.4 cm Composition: solid/almost completely solid (2) Echogenicity: isoechoic (1) Shape: not taller-than-wide (0) Margins: smooth (0) Echogenic foci: none (0) ACR TI-RADS total points: 3. ACR TI-RADS risk category: TR3 (3 points). ACR TI-RADS recommendations: **Given size (>/= 2.5 cm) and appearance, fine needle aspiration of this mildly suspicious nodule should be considered based on TI-RADS criteria. _________________________________________________________ Multiple additional small subcentimeter hypoechoic cysts and nodules in the right gland which do not meet criteria for further evaluation. No suspicious lymphadenopathy. IMPRESSION: Large 3.8 cm TI-RADS category 3 nodule (labeled # 3) in the left mid gland meets criteria for  consideration of fine-needle aspiration biopsy. Additional incidental tiny thyroid cysts and nodules are also present but do not meet criteria for biopsy or dedicated follow-up. The above is in keeping with the ACR TI-RADS recommendations - J Am Coll Radiol 2017;14:587-595. Electronically Signed   By: Jacqulynn Cadet M.D.   On: 04/19/2018 16:55    EKG:   Orders placed or performed during the hospital encounter of 04/17/18  . ED EKG within 10 minutes  . ED EKG within 10 minutes  . EKG 12-Lead  . EKG 12-Lead  . EKG 12-Lead      Management plans discussed with the Julie Bender, family and they are in agreement.  CODE STATUS:     Code Status Orders  (From admission, onward)         Start     Ordered   04/18/18 0209  Full code  Continuous     04/18/18 0208        Code Status History    Date Active Date Inactive Code Status Order ID Comments User Context   09/05/2016 0305 09/11/2016 1601 Full Code [id number]  Hugelmeyer, Ubaldo Glassing, DO Inpatient      TOTAL TIME TAKING CARE OF THIS Julie Bender: 45 minutes.    Avel Peace Jaxzen Vanhorn M.D on 04/20/2018 at 11:36 AM  Between 7am to 6pm - Pager - [phone]  After 6pm go to [email or web address] - password [email or web address] [facility]  Office  [phone]  CC: Primary care physician; [facility]   Note: This dictation was prepared with Dragon dictation along with smaller phrase technology. Any transcriptional errors that result from this process are unintentional.

## 2018-04-20 NOTE — Discharge Instructions (Signed)
Shortness of Breath, Adult Shortness of breath is when a person has trouble breathing enough air, or when a person feels like she or he is having trouble breathing in enough air. Shortness of breath could be a sign of medical problem. Follow these instructions at home: Pay attention to any changes in your symptoms. Take these actions to help with your condition:  Do not smoke. Smoking is a common cause of shortness of breath. If you smoke and you need help quitting, ask your health care provider.  Avoid things that can irritate your airways, such as: ? Mold. ? Dust. ? Air pollution. ? Chemical fumes. ? Things that can cause allergy symptoms (allergens), if you have allergies.  Keep your living space clean and free of mold and dust.  Rest as needed. Slowly return to your usual activities.  Take over-the-counter and prescription medicines, including oxygen and inhaled medicines, only as told by your health care provider.  Keep all follow-up visits as told by your health care provider. This is important.  Contact a health care provider if:  Your condition does not improve as soon as expected.  You have a hard time doing your normal activities, even after you rest.  You have new symptoms. Get help right away if:  Your shortness of breath gets worse.  You have shortness of breath when you are resting.  You feel light-headed or you faint.  You have a cough that is not controlled with medicines.  You cough up blood.  You have pain with breathing.  You have pain in your chest, arms, shoulders, or abdomen.  You have a fever.  You cannot walk up stairs or exercise the way that you normally do. This information is not intended to replace advice given to you by your health care provider. Make sure you discuss any questions you have with your health care provider. Document Released: 04/27/2001 Document Revised: 02/21/2016 Document Reviewed: 01/08/2016 Elsevier Interactive Patient  Education  2018 Elsevier Inc.  

## 2018-04-20 NOTE — Care Management Note (Signed)
Case Management Note  Patient Details  Name: Julie Bender MRN: [id number] Date of Birth: Apr 13, 1961  Subjective/Objective:                 Patient  moved to [place] from [place] one month ago.  It was a planned move..  She lives with her sister.  Patient is visually impaired. Even though she is visually impaired other than not being able to drive, she does not meet home bound criteria.  No issues getting into a car and leaving home for extended periods of time.  . Recent sleep in July 2019 study and script was sent to a DME company in [place]. Patient says she can not afford the monthly copay for the machine after her insurance pays. Provided patient with CPAP Assistance Program application.  Discussed that CM does not know how long it would take to process the application so she may want to move forward with her current cpap order coordination until assist application can be processed.  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  Her sx are usually managed very well but during the month of August, She was traveling back and forth between [place] as her son was hospitalized.  She did not weigh herself daily which she usually does.  She will keep her pcp at [facility] but will pursue cardiology in [place].  She would like to have her meds bubble packed. [facility] provides that service and provided patient with contact information and instructed her to call.  She receives 1700 dollars a month for disability and as of August, she has been disabled 2 years.  She is looking into applying for medicare.  Discussed part D coverage and provided contact information to assist with Medicare application.  She has Chief of Staff with medication coverage at present.  Reached out to [facility] regarding need for appointment.  Asked unit secretary to schedule follow up appointment with Dr Donivan Scull  office.   Action/Plan:   Expected Discharge Date:                  Expected Discharge Plan:  Home/Self Care  In-House Referral:     Discharge planning Services  HF Clinic, CM Consult, Homebound not met per provider  Post Acute Care Choice:    Choice offered to:     DME Arranged:    DME Agency:     HH Arranged:    HH Agency:     Status of Service:  Completed, signed off  If discussed at [employer] of Stay Meetings, dates discussed:    Additional Comments:  Katrina Stack, RN 04/20/2018, 9:34 AM

## 2018-04-20 NOTE — Plan of Care (Signed)
  Problem: Clinical Measurements: Goal: Respiratory complications will improve Outcome: Progressing Goal: Cardiovascular complication will be avoided Outcome: Progressing   Problem: Activity: Goal: Risk for activity intolerance will decrease Outcome: Progressing   Problem: Safety: Goal: Ability to remain free from injury will improve Outcome: Not Progressing  Pt refusing bed alarm

## 2018-04-30 DIAGNOSIS — G4733 Obstructive sleep apnea (adult) (pediatric): Secondary | ICD-10-CM | POA: Insufficient documentation

## 2018-04-30 DIAGNOSIS — I110 Hypertensive heart disease with heart failure: Secondary | ICD-10-CM | POA: Insufficient documentation

## 2018-04-30 NOTE — Progress Notes (Deleted)
Cardiology Office Note  Date:  04/30/2018   ID:  Julie Bender, DOB Sep 21, 1960, MRN [id number]  PCP:  [facility] [place]   No chief complaint on file.   HPI:  57 year old woman followed at [facility] cardiology  normal ejection fraction, severe LVH,  midcavity obliteration gradient 41 up to 62 mmHg, pulmonary hypertension,  morbid obesity,  significant mitral valve regurgitation,  obstructive sleep apnea not treated secondary to financial issues  Hospital admission 04/18/2018 with shortness of breath Who presents for follow up of her chronic diastolic chf  Recently moved to [place] to live with her sister and mother who also had medical issues. She has noticed increased shortness of breath over the past several days, ome chest discomfort with deep inspiration and some cough. Not on verapamil  On arrival blood pressure elevated Chest x-ray central vascular and interstitial edema Troponin 0.4 now trending down 0.34 BNP mildly elevated 875    PMH:   has a past medical history of CHF (congestive heart failure) ([place]), COPD (chronic obstructive pulmonary disease) ([place]), Diabetes mellitus without complication ([place]), Diabetic retinopathy ([place]), History of placement of internal cardiac defibrillator, Hypertension, and Hypertrophic cardiomyopathy ([place]).  PSH:    Past Surgical History:  Procedure Laterality Date  . CARDIAC DEFIBRILLATOR PLACEMENT    . CHOLECYSTECTOMY      Current Outpatient Medications  Medication Sig Dispense Refill  . albuterol (PROVENTIL HFA;VENTOLIN HFA) 108 (90 Base) MCG/ACT inhaler Inhale 2 puffs into the lungs every 6 (six) hours as needed for wheezing or shortness of breath. 1 Inhaler 2  . aspirin EC 81 MG tablet Take 81 mg by mouth daily.    Marland Kitchen atorvastatin (LIPITOR) 40 MG tablet Take 1 tablet (40 mg total) by mouth daily at 6 PM. 90 tablet 0  . benzonatate (TESSALON) 100 MG capsule Take 1 capsule (100 mg total) by mouth 3 (three) times daily as needed for cough.  20 capsule 0  . bumetanide (BUMEX) 1 MG tablet Take 1 mg by mouth 2 (two) times daily.    . carvedilol (COREG) 25 MG tablet Take 25 mg by mouth 2 (two) times daily with a meal.    . citalopram (CELEXA) 20 MG tablet Take 20 mg by mouth daily.    . insulin aspart (NOVOLOG FLEXPEN) 100 UNIT/ML FlexPen Inject 12 Units into the skin 3 (three) times daily.    . insulin glargine (LANTUS) 100 UNIT/ML injection Inject 40 Units into the skin at bedtime.    Marland Kitchen losartan (COZAAR) 100 MG tablet Take 100 mg by mouth daily.    . metFORMIN (GLUCOPHAGE) 850 MG tablet Take 850 mg by mouth 3 (three) times daily.    . Metoprolol Tartrate 75 MG TABS Take 75 mg by mouth 2 (two) times daily. 60 tablet 1  . predniSONE (DELTASONE) 10 MG tablet 30 mg po daily for 2 days, 20 mg po daily for 2 days, 10 mg po daily for 2 days. (Patient not taking: Reported on 04/17/2018) 12 tablet 0  . spironolactone (ALDACTONE) 25 MG tablet Take 25 mg by mouth daily.    Marland Kitchen torsemide (DEMADEX) 20 MG tablet Take 60 mg by mouth daily.    . traZODone (DESYREL) 150 MG tablet Take 150 mg by mouth at bedtime.    . verapamil (CALAN-SR) 240 MG CR tablet Take 240 mg by mouth 2 (two) times daily.    Marland Kitchen VICTOZA 18 MG/3ML SOPN Inject 0.6 mg into the skin daily.  11   No current facility-administered medications for  this visit.      Allergies:   Patient has no known allergies.   Social History:  The patient  reports that she is a non-smoker but has been exposed to tobacco smoke. She has never used smokeless tobacco. She reports that she does not drink alcohol or use drugs.   Family History:   family history includes Hypertrophic cardiomyopathy in her sister.    Review of Systems: ROS   PHYSICAL EXAM: VS:  There were no vitals taken for this visit. , BMI There is no height or weight on file to calculate BMI. GEN: Well nourished, well developed, in no acute distress HEENT: normal Neck: no JVD, carotid bruits, or masses Cardiac: RRR; no murmurs,  rubs, or gallops,no edema  Respiratory:  clear to auscultation bilaterally, normal work of breathing GI: soft, nontender, nondistended, + BS MS: no deformity or atrophy Skin: warm and dry, no rash Neuro:  Strength and sensation are intact Psych: euthymic mood, full affect    Recent Labs: 04/17/2018: ALT 18; B Natriuretic Peptide 875.0 04/18/2018: Hemoglobin 11.3; Platelets 254 04/19/2018: Magnesium 1.9; TSH 0.351 04/20/2018: BUN 23; Creatinine, Ser 0.76; Potassium 3.7; Sodium 139    Lipid Panel Lab Results  Component Value Date   CHOL 90 09/05/2016   HDL 30 (L) 09/05/2016   LDLCALC 48 09/05/2016   TRIG 60 09/05/2016      Wt Readings from Last 3 Encounters:  04/20/18 220 lb 11.2 oz (100.1 kg)  09/11/16 229 lb 11.2 oz (104.2 kg)       ASSESSMENT AND PLAN:  No diagnosis found.   Disposition:   F/U  6 months  No orders of the defined types were placed in this encounter.    Signed, Esmond Plants, M.D., Ph.D. 04/30/2018  [place], [place]

## 2018-05-01 ENCOUNTER — Ambulatory Visit: Payer: BC Managed Care – PPO | Admitting: Cardiovascular Disease

## 2018-05-01 ENCOUNTER — Encounter

## 2018-05-04 NOTE — Progress Notes (Signed)
Cardiology Office Note  Date:  05/05/2018   ID:  Julie Bender, DOB May 30, 1961, MRN [id number]  PCP:  [facility] [place]   Chief Complaint  Patient presents with  . other    Nstemi c/o leg/arm/thigh pain. Meds reviewed verbally with pt.    HPI:  57 year old woman followed at [facility] cardiology  normal ejection fraction, severe LVH,  midcavity obliteration gradient 41 up to 62 mmHg, pulmonary hypertension,  morbid obesity,  significant mitral valve regurgitation,  obstructive sleep apnea not treated secondary to financial issues  Hospital admission 04/18/2018 with shortness of breath Who presents for follow up of her chronic diastolic chf  Recently seen in the hospital 04/18/2018 Hospital records reviewed with the patient in detail Cute respiratory distress secondary to morbid obesity deconditioning chronic diastolic CHF obstructive sleep apnea, HOCM Given oxygen, IV Lasix, restarted on verapamil Elevated troponin in the setting of demand ischemia, severe LVH Atypical chest discomfort  In follow-up today she reports that she feels well She is actually taking Bumex and torsemide, this was on her discharge paperwork Carvedilol and metoprolol on her discharge paperwork Blood pressure running low  Called ENT number provided on discharge paperwork, for thyroid nodule Phone number does not work  Recently moved to [place] to live with her sister and mother who also had medical issues.  In the hospital BNP mildly elevated 875  Left ventricle: The cavity size was normal. There was moderate   concentric hypertrophy, septal wall appears to be moderate to   severe (apical region appears to be spared) with mild LVOT   obstruction. Systolic function was normal. The estimated ejection   fraction was in the range of 55% to 60%. Wall motion was normal;   there were no regional wall motion abnormalities. Features are   consistent with a pseudonormal left ventricular filling pattern,  with concomitant abnormal relaxation and increased filling   pressure (grade 2 diastolic dysfunction). - Aortic valve: There was trivial regurgitation. - Mitral valve: There was mild to moderate regurgitation. - Left atrium: The atrium was mildly dilated. - Right ventricle: Systolic function was normal. - Pulmonary arteries: Systolic pressure could not be accurately   estimated.  EKG personally reviewed by myself on todays visit Shows sinus bradycardia rate 55 bpm, LVH,  nonspecific ST abnormality   PMH:   has a past medical history of CHF (congestive heart failure) ([place]), COPD (chronic obstructive pulmonary disease) ([place]), Diabetes mellitus without complication ([place]), Diabetic retinopathy ([place]), History of placement of internal cardiac defibrillator, Hypertension, and Hypertrophic cardiomyopathy ([place]).  PSH:    Past Surgical History:  Procedure Laterality Date  . CARDIAC DEFIBRILLATOR PLACEMENT    . CHOLECYSTECTOMY      Current Outpatient Medications  Medication Sig Dispense Refill  . albuterol (PROVENTIL HFA;VENTOLIN HFA) 108 (90 Base) MCG/ACT inhaler Inhale 2 puffs into the lungs every 6 (six) hours as needed for wheezing or shortness of breath. 1 Inhaler 2  . aspirin EC 81 MG tablet Take 81 mg by mouth daily.    Marland Kitchen atorvastatin (LIPITOR) 40 MG tablet Take 1 tablet (40 mg total) by mouth daily at 6 PM. 90 tablet 0  . benzonatate (TESSALON) 100 MG capsule Take 1 capsule (100 mg total) by mouth 3 (three) times daily as needed for cough. 20 capsule 0  . bumetanide (BUMEX) 1 MG tablet Take 1 mg by mouth 2 (two) times daily.    . carvedilol (COREG) 25 MG tablet Take 25 mg by mouth 2 (two) times daily with  a meal.    . citalopram (CELEXA) 20 MG tablet Take 20 mg by mouth daily.    . insulin aspart (NOVOLOG FLEXPEN) 100 UNIT/ML FlexPen Inject 12 Units into the skin 3 (three) times daily.    . insulin glargine (LANTUS) 100 UNIT/ML injection Inject 40 Units into the skin at bedtime.    Marland Kitchen  losartan (COZAAR) 100 MG tablet Take 100 mg by mouth daily.    . metFORMIN (GLUCOPHAGE) 850 MG tablet Take 850 mg by mouth 3 (three) times daily.    Marland Kitchen spironolactone (ALDACTONE) 25 MG tablet Take 25 mg by mouth daily.    Marland Kitchen torsemide (DEMADEX) 20 MG tablet Take 60 mg by mouth daily.    . traZODone (DESYREL) 150 MG tablet Take 150 mg by mouth at bedtime.    . verapamil (CALAN-SR) 240 MG CR tablet Take 240 mg by mouth 2 (two) times daily.    Marland Kitchen VICTOZA 18 MG/3ML SOPN Inject 0.6 mg into the skin daily.  11   No current facility-administered medications for this visit.      Allergies:   Patient has no known allergies.   Social History:  The patient  reports that she is a non-smoker but has been exposed to tobacco smoke. She has never used smokeless tobacco. She reports that she does not drink alcohol or use drugs.   Family History:   family history includes Hypertrophic cardiomyopathy in her sister.    Review of Systems: Review of Systems  Constitutional: Negative.   Respiratory: Positive for shortness of breath.   Cardiovascular: Negative.   Gastrointestinal: Negative.   Musculoskeletal: Negative.   Neurological: Negative.   Psychiatric/Behavioral: Negative.   All other systems reviewed and are negative.   PHYSICAL EXAM: VS:  BP 102/64 (BP Location: Left Arm, Patient Position: Sitting, Cuff Size: Large)   Pulse (!) 55   Ht 5\' 4"  (1.626 m)   Wt 216 lb 8 oz (98.2 kg)   BMI 37.16 kg/m  , BMI Body mass index is 37.16 kg/m. GEN: Well nourished, well developed, in no acute distress , obese HEENT: normal  Neck: no JVD, carotid bruits, or masses Cardiac: RRR; no murmurs, rubs, or gallops,no edema  Respiratory:  clear to auscultation bilaterally, normal work of breathing GI: soft, nontender, nondistended, + BS MS: no deformity or atrophy  Skin: warm and dry, no rash Neuro:  Strength and sensation are intact Psych: euthymic mood, full affect   Recent Labs: 04/17/2018: ALT 18; B  Natriuretic Peptide 875.0 04/18/2018: Hemoglobin 11.3; Platelets 254 04/19/2018: Magnesium 1.9; TSH 0.351 04/20/2018: BUN 23; Creatinine, Ser 0.76; Potassium 3.7; Sodium 139    Lipid Panel Lab Results  Component Value Date   CHOL 90 09/05/2016   HDL 30 (L) 09/05/2016   LDLCALC 48 09/05/2016   TRIG 60 09/05/2016      Wt Readings from Last 3 Encounters:  05/05/18 216 lb 8 oz (98.2 kg)  04/20/18 220 lb 11.2 oz (100.1 kg)  09/11/16 229 lb 11.2 oz (104.2 kg)      ASSESSMENT AND PLAN:  HOCM (hypertrophic obstructive cardiomyopathy) (HCC) - Plan: EKG 12-Lead Stressed importance of staying on her carvedilol and verapamil Torsemide 60 daily  Hypertensive heart disease with heart failure (HCC) Blood pressure is well controlled on today's visit. No changes made to the medications. We have stopped metoprolol, stopped Bumex  Dyspnea, unspecified type - Plan: EKG 12-Lead Currently on torsemide 60 mg in the morning Recommended for fullness in her chest and abdominal bloating  consistent with fluid retention she take extra 1 or 2 torsemide after lunch  Demand ischemia (HCC) Secondary to severe LVH Normal ejection fraction  Thyroid nodule 3.6 cm noted while she was in the hospital Recommendation made to refer to ear nose throat She reports long history of goiter  Chronic diastolic CHF Secondary to LVH, morbid obesity, bstructive sleep apnea Referral to pulmonary to help get her CPAP  she recently moved to the area  Obstructive sleep apnea Reports she has a diagnosis of obstructive sleep apnea She has prescription Moved to the area recently unable to fill her prescription in [place] where she used to live Recommendation made to establish with pulmonary and they could help with sleep apnea prescription and follow with her    Total encounter time more than 45 minutes  Greater than 50% was spent in counseling and coordination of care with the patient  Disposition:   F/U  6 months  No  orders of the defined types were placed in this encounter.    Signed, Esmond Plants, M.D., Ph.D. 05/05/2018  [place], [place]

## 2018-05-05 ENCOUNTER — Ambulatory Visit (INDEPENDENT_AMBULATORY_CARE_PROVIDER_SITE_OTHER): Payer: BC Managed Care – PPO | Admitting: Cardiovascular Disease

## 2018-05-05 ENCOUNTER — Encounter: Payer: Self-pay | Admitting: Cardiovascular Disease

## 2018-05-05 VITALS — BP 102/64 | HR 55 | Ht 64.0 in | Wt 216.5 lb

## 2018-05-05 DIAGNOSIS — G4733 Obstructive sleep apnea (adult) (pediatric): Secondary | ICD-10-CM

## 2018-05-05 DIAGNOSIS — R0602 Shortness of breath: Secondary | ICD-10-CM | POA: Insufficient documentation

## 2018-05-05 DIAGNOSIS — I110 Hypertensive heart disease with heart failure: Secondary | ICD-10-CM

## 2018-05-05 DIAGNOSIS — R0600 Dyspnea, unspecified: Secondary | ICD-10-CM

## 2018-05-05 DIAGNOSIS — E041 Nontoxic single thyroid nodule: Secondary | ICD-10-CM

## 2018-05-05 DIAGNOSIS — I421 Obstructive hypertrophic cardiomyopathy: Secondary | ICD-10-CM

## 2018-05-05 DIAGNOSIS — I248 Other forms of acute ischemic heart disease: Secondary | ICD-10-CM

## 2018-05-05 MED ORDER — TORSEMIDE 20 MG PO TABS: 60.0000 mg | ORAL_TABLET | Freq: Every day | ORAL | 3 refills | Status: DC

## 2018-05-05 MED ORDER — LOSARTAN POTASSIUM 50 MG PO TABS: 50.0000 mg | ORAL_TABLET | Freq: Every day | ORAL | 3 refills | Status: DC

## 2018-05-05 NOTE — Patient Instructions (Addendum)
Referral to ENT: Thyroid  Referral to Pulmonary: OSA, needs help getting CPAP  Medication Instructions:   Stop the metoprolol Stop the Bumex  Cut the losartan in 1/2 daily  Labwork:  No new labs needed  Testing/Procedures:  No further testing at this time   Follow-Up: It was a pleasure seeing you in the office today. Please call us if you have new issues that need to be addressed before your next appt.  [phone]  Your physician recommends that you schedule a follow-up appointment in: 2-3 weeks with Darylene Price, NP in the [facility] (we will reschedule your appointment for 05/08/18)  Your physician wants you to follow-up in: 6 months.  You will receive a reminder letter in the mail two months in advance. If you don't receive a letter, please call our office to schedule the follow-up appointment.  If you need a refill on your cardiac medications before your next appointment, please call your pharmacy.  For educational health videos Log in to : [email or web address] Or : [email or web address], password : [email or web address]

## 2018-05-08 ENCOUNTER — Ambulatory Visit: Payer: BC Managed Care – PPO | Admitting: Family

## 2018-05-10 ENCOUNTER — Encounter: Payer: Self-pay | Admitting: Internal Medicine

## 2018-05-10 ENCOUNTER — Ambulatory Visit (INDEPENDENT_AMBULATORY_CARE_PROVIDER_SITE_OTHER): Payer: BC Managed Care – PPO | Admitting: Internal Medicine

## 2018-05-10 VITALS — BP 126/70 | HR 63 | Ht 64.0 in

## 2018-05-10 DIAGNOSIS — G4733 Obstructive sleep apnea (adult) (pediatric): Secondary | ICD-10-CM | POA: Diagnosis not present

## 2018-05-10 DIAGNOSIS — E669 Obesity, unspecified: Secondary | ICD-10-CM | POA: Insufficient documentation

## 2018-05-10 NOTE — Patient Instructions (Addendum)
Please provide [place] a copy of your 2 recent sleep studies. Once we have these we can set you up on CPAP.

## 2018-05-10 NOTE — Progress Notes (Addendum)
[facility] Consultation      Assessment and Plan:  Obstructive sleep apnea with excessive daytime sleepiness.  - Recently diagnosed with obstructive sleep apnea sleep lab in a different location. - We will need to get records from her old sleep studies, apparently she had 2 in lab sleep studies (presumably a diagnostic study followed by titration). -Once we have these results we can set her up with a CPAP.  Addendum 05/25/2018;  Review of outside records: I personally reviewed raw data from the sleep study. **diagnostic sleep study 10/02/2017>>  PLM index of 132, PLM arousal index of 13.7.  Mild sleep apnea with an AHI of 7.1.  CPAP titration study was recommended as well as work-up for periodic limb movement disorder. **CPAP titration study 12/05/2017>> PLM index 65, PLM arousal index of 2.  Hypopnea index remained elevated above 10 and all pressures until a pressure of 10 was achieved and AHI was 0. CPAP of 10 was recommended.  --I would recommend a pressure range of 9-15 cm H2O.   Essential hypertension, diabetes mellitus, hypertrophic cardiomyopathy.  - Sleep apnea can contribute to above conditions, therefore treatment of sleep apnea is an important part of their management.   Date: 05/10/2018  MRN# [id number] Julie Bender 03/04/1961    Julie Bender is a 57 y.o. old female seen in consultation for chief complaint of:    Chief Complaint  Patient presents with  . Consult    Referred by Dr. Rockey Situ for eval of sleep apnea:  . excessive daytime sleepiness    Sleep study done in [place]    HPI:   The patient is a 57 yo female with a history of CHF, COPD, diabetes mellitus, essential hypertension, hypertrophic cardiomyopathy.  She apparently was recently diagnosed with obstructive sleep apnea when she lived in [place].  Subsequently she was prescribed a CPAP but had not yet picked it up.  She then moved up here.  She typically goes to bed at 10:30 PM.  She  has significant daytime sleepiness with an Epworth score of 18. She had her sleep study at [facility], and then was sent back again for a second night.. She was going to get her CPAP from a DME company, but does not remember the name.  She moved here less than a month ago.    She denies jaw pain, TMJ. Had an episode of sleep paralysis several years ago, no sleep walking, no cataplexy.   Addendum 06/16/18; Review of outside records;  History of hypertrophic cardiomyopathy, LVEDP in 03/2016 was 40 mmHg, Wedge 25, mPAP 41.    I spent 45 minutes today and on 05/25/18 reviewing old records of this patient and coordinating/ordering above tests after her initial visit.     PMHX:   Past Medical History:  Diagnosis Date  . CHF (congestive heart failure) ([place])   . COPD (chronic obstructive pulmonary disease) ([place])   . Diabetes mellitus without complication ([place])   . Diabetic retinopathy ([place])   . History of placement of internal cardiac defibrillator   . Hypertension   . Hypertrophic cardiomyopathy ([place])    Surgical Hx:  Past Surgical History:  Procedure Laterality Date  . CARDIAC DEFIBRILLATOR PLACEMENT    . CHOLECYSTECTOMY     Family Hx:  Family History  Problem Relation Age of Onset  . Hypertrophic cardiomyopathy Sister    Social Hx:   Social History   Tobacco Use  . Smoking status: Passive Smoke Exposure - Never Smoker  .  Smokeless tobacco: Never Used  Substance Use Topics  . Alcohol use: No  . Drug use: Never   Medication:    Current Outpatient Medications:  .  albuterol (PROVENTIL HFA;VENTOLIN HFA) 108 (90 Base) MCG/ACT inhaler, Inhale 2 puffs into the lungs every 6 (six) hours as needed for wheezing or shortness of breath., Disp: 1 Inhaler, Rfl: 2 .  aspirin EC 81 MG tablet, Take 81 mg by mouth daily., Disp: , Rfl:  .  atorvastatin (LIPITOR) 40 MG tablet, Take 1 tablet (40 mg total) by mouth daily at 6 PM., Disp: 90 tablet, Rfl: 0 .  benzonatate (TESSALON) 100 MG  capsule, Take 1 capsule (100 mg total) by mouth 3 (three) times daily as needed for cough., Disp: 20 capsule, Rfl: 0 .  carvedilol (COREG) 25 MG tablet, Take 25 mg by mouth 2 (two) times daily with a meal., Disp: , Rfl:  .  insulin aspart (NOVOLOG FLEXPEN) 100 UNIT/ML FlexPen, Inject 12 Units into the skin 3 (three) times daily., Disp: , Rfl:  .  insulin glargine (LANTUS) 100 UNIT/ML injection, Inject 40 Units into the skin at bedtime., Disp: , Rfl:  .  losartan (COZAAR) 50 MG tablet, Take 1 tablet (50 mg total) by mouth daily., Disp: 90 tablet, Rfl: 3 .  metFORMIN (GLUCOPHAGE) 850 MG tablet, Take 850 mg by mouth 3 (three) times daily., Disp: , Rfl:  .  spironolactone (ALDACTONE) 25 MG tablet, Take 25 mg by mouth daily., Disp: , Rfl:  .  torsemide (DEMADEX) 20 MG tablet, Take 3 tablets (60 mg total) by mouth daily., Disp: 270 tablet, Rfl: 3 .  traZODone (DESYREL) 150 MG tablet, Take 150 mg by mouth at bedtime., Disp: , Rfl:  .  VICTOZA 18 MG/3ML SOPN, Inject 0.6 mg into the skin daily., Disp: , Rfl: 11 .  citalopram (CELEXA) 20 MG tablet, Take 20 mg by mouth daily., Disp: , Rfl:  .  verapamil (CALAN-SR) 240 MG CR tablet, Take 240 mg by mouth 2 (two) times daily., Disp: , Rfl:    Allergies:  Patient has no known allergies.  Review of Systems: Gen:  Denies  fever, sweats, chills HEENT: Denies blurred vision, double vision. bleeds, sore throat Cvc:  No dizziness, chest pain. Resp:   Denies cough or sputum production, shortness of breath Gi: Denies swallowing difficulty, stomach pain. Gu:  Denies bladder incontinence, burning urine Ext:   No Joint pain, stiffness. Skin: No skin rash,  hives  Endoc:  No polyuria, polydipsia. Psych: No depression, insomnia. Other:  All other systems were reviewed with the patient and were negative other that what is mentioned in the HPI.   Physical Examination:   VS: BP 126/70 (BP Location: Left Arm, Cuff Size: Large)   Pulse 63   Ht 5\' 4"  (1.626 m)    SpO2 97%   BMI 37.16 kg/m   General Appearance: No distress  Neuro:without focal findings,  speech normal,  HEENT: PERRLA, EOM intact.   Pulmonary: normal breath sounds, No wheezing.  CardiovascularNormal S1,S2.  No m/r/g.   Abdomen: Benign, Soft, non-tender. Renal:  No costovertebral tenderness  GU:  No performed at this time. Endoc: No evident thyromegaly, no signs of acromegaly. Skin:   warm, no rashes, no ecchymosis  Extremities: normal, no cyanosis, clubbing.  Other findings:    LABORATORY PANEL:   CBC No results for input(s): WBC, HGB, HCT, PLT in the last 168 hours. ------------------------------------------------------------------------------------------------------------------  Chemistries  No results for input(s): NA, K, CL, CO2, GLUCOSE,  BUN, CREATININE, CALCIUM, MG, AST, ALT, ALKPHOS, BILITOT in the last 168 hours.  Invalid input(s): GFRCGP ------------------------------------------------------------------------------------------------------------------  Cardiac Enzymes No results for input(s): TROPONINI in the last 168 hours. ------------------------------------------------------------  RADIOLOGY:  No results found.     Thank  you for the consultation and for allowing [facility] to assist in the care of your patient. Our recommendations are noted above.  Please contact us if we can be of further service.   Marda Stalker, M.D., F.C.C.P.  Board Certified in Internal Medicine, Pulmonary Medicine, [place], and Sleep Medicine.  [facility] Office Number: [phone]   05/10/2018

## 2018-05-11 ENCOUNTER — Telehealth: Payer: Self-pay | Admitting: *Deleted

## 2018-05-11 NOTE — Telephone Encounter (Signed)
Attempted to contact patient and let her know we have requested her sleep study form [facility]. Pt phone not accepting messages. Consent faxed. Nothing further needed.

## 2018-05-16 ENCOUNTER — Ambulatory Visit: Payer: BC Managed Care – PPO | Admitting: Cardiovascular Disease

## 2018-05-16 ENCOUNTER — Telehealth: Payer: Self-pay | Admitting: Cardiovascular Disease

## 2018-05-16 NOTE — Telephone Encounter (Signed)
Received records request from [facility] , forwarded to [facility] for processing

## 2018-05-23 ENCOUNTER — Other Ambulatory Visit: Payer: Self-pay | Admitting: Unknown Physician Specialty

## 2018-05-23 DIAGNOSIS — E041 Nontoxic single thyroid nodule: Secondary | ICD-10-CM

## 2018-05-23 NOTE — Telephone Encounter (Signed)
Received status update from [employer] [facility], faxed request to [place], and sent interoffice.

## 2018-05-25 ENCOUNTER — Telehealth: Payer: Self-pay | Admitting: *Deleted

## 2018-05-25 ENCOUNTER — Other Ambulatory Visit: Payer: Self-pay | Admitting: *Deleted

## 2018-05-25 DIAGNOSIS — G4733 Obstructive sleep apnea (adult) (pediatric): Secondary | ICD-10-CM

## 2018-05-25 NOTE — Telephone Encounter (Signed)
Pt aware of results Orders placed nothing further needed.

## 2018-05-25 NOTE — Progress Notes (Signed)
Pt aware. Orders placed. 

## 2018-05-25 NOTE — Telephone Encounter (Signed)
-----   Message from Laverle Hobby, MD sent at 05/25/2018 10:53 AM EDT ----- Regarding: Recommend Auto-CPAP.  Outside sleep study reviewed, positive for OSA. Would recommend auto-CPAP with pressure range of 9-15 cm H2O.

## 2018-05-31 ENCOUNTER — Ambulatory Visit: Payer: BC Managed Care – PPO | Admitting: Family

## 2018-06-02 ENCOUNTER — Ambulatory Visit
Admission: RE | Admit: 2018-06-02 | Discharge: 2018-06-02 | Disposition: A | Discharge status: Home / Self Care | Payer: BC Managed Care – PPO | Source: Ambulatory Visit | Attending: Unknown Physician Specialty | Admitting: Unknown Physician Specialty

## 2018-06-02 DIAGNOSIS — E042 Nontoxic multinodular goiter: Secondary | ICD-10-CM | POA: Diagnosis present

## 2018-06-02 DIAGNOSIS — E041 Nontoxic single thyroid nodule: Secondary | ICD-10-CM

## 2018-06-02 NOTE — Telephone Encounter (Signed)
Received 2nd request for records from [place].  Duplicate . Document Scanned to release on hold per ciox.

## 2018-06-05 ENCOUNTER — Telehealth: Payer: Self-pay | Admitting: Cardiovascular Disease

## 2018-06-05 LAB — CYTOLOGY - NON PAP

## 2018-06-05 NOTE — Telephone Encounter (Signed)
Received records request [place], forwarded to [facility] for processing.

## 2018-06-20 ENCOUNTER — Other Ambulatory Visit: Payer: Self-pay | Admitting: Cardiovascular Disease

## 2018-06-22 ENCOUNTER — Telehealth: Payer: Self-pay | Admitting: Cardiovascular Disease

## 2018-06-22 NOTE — Telephone Encounter (Signed)
Pt c/o medication issue:  1. Name of Medication: Coreg 25 mg po BID  2. How are you currently taking this medication (dosage and times per day)?   3. Are you having a reaction (difficulty breathing--STAT)? NO  4. What is your medication issue? [employer] Wants to confirm yes or no is patient taking this med .  Patient unable to tell them and she has not had it refilled in a while.

## 2018-06-26 NOTE — Telephone Encounter (Signed)
Discharge from the hospital I believe she had to beta-blockers on her list Metoprolol and carvedilol On her office visit we canceled the metoprolol and continue carvedilol Can we contact sister to find out if she has this in her pillbox

## 2018-06-26 NOTE — Telephone Encounter (Signed)
S/w [facility] for patient. They found there seemed to be a gap in refills for carvedilol beta blocker.  On 04/17/18 at [facility], patient was discharged on carvedilol and metoprolol on discharge summary. On 05/05/18, patient saw Dr Rockey Situ and he discontinued   Has not had refill on beta-blocker since the beginning of September or October. Patient does not know if she's taking a beta-blocker because she is not able to see well. Patient's sister does the pill box and was not home at the time the nurse called her to ask about the carvedilol.    On 06/23/18, pt saw PCP, Dr Lehman Prom, who advised: "Emphasized to her the importance of coming in here when she starts noticing her weight is high. Continue carvedilol losartan atorvastatin and aspirin and torsemide" BP was 119/71, HR 74 at that visit.  However, carvedilol is not on her list at PCP on 06/23/18.  Seth Bake with Care Management is going to contact sister as well to see about the Carvedilol.  In the meantime, routing to Dr Rockey Situ to confirm if he does want patient on carvedilol and if so can we send in the prescription.

## 2018-06-26 NOTE — Telephone Encounter (Signed)
Dr Rockey Situ last saw patient 05/05/18 and carvedilol is on her med-list at this visit.  However, patient had office visit at [facility] with PCP Dr Lehman Prom on 06/23/18 and carvedilol is NOT on her list of medications.  It appears patient is not on this medication at this time.  No answer. Left message to call back with [place].

## 2018-06-27 ENCOUNTER — Telehealth: Payer: Self-pay | Admitting: Cardiovascular Disease

## 2018-06-27 DIAGNOSIS — Z79899 Other long term (current) drug therapy: Secondary | ICD-10-CM

## 2018-06-27 DIAGNOSIS — I110 Hypertensive heart disease with heart failure: Secondary | ICD-10-CM

## 2018-06-27 NOTE — Telephone Encounter (Signed)
I called and spoke with the patient's sister, Kennyth Lose (emergency contact on file) to try to clarify the patient's medications.  Per Kennyth Lose, she is not currently at home with the patient. She asked that I call their other sister, Alyse Low, who is currently with the patient and also helps with her medications at [phone].  I attempted to call Alyse Low, this did go to her identified voice mail. I asked that she call back to clarify the patient's medications, specifically if she is taking metoprolol / carvedilol.

## 2018-06-27 NOTE — Telephone Encounter (Signed)
Please call regarding pt medication Coreg and Spironolactone.

## 2018-06-29 NOTE — Telephone Encounter (Signed)
We have tried to reach the patient and her 2 sisters without success to verify her medications- see 06/22/18 phone note.

## 2018-06-29 NOTE — Telephone Encounter (Signed)
Attempted to call the patient regarding her medications as no back has been received from her sisters.   No answer/ no voice mail. Will try to call back.

## 2018-06-29 NOTE — Telephone Encounter (Signed)
Seth Bake from [place] calling Would like clarification on medications Please call to discuss at [phone]

## 2018-06-30 NOTE — Telephone Encounter (Signed)
Left voicemail message for Julie Bender case manager to review medications.

## 2018-06-30 NOTE — Telephone Encounter (Signed)
Spoke with Julie Bender patients sister and she reviewed medications that patient is taking. She states that patient is taking carvedilol and spironolactone but that she is out of that medication. She mentioned that Seth Bake is helping her figure out her medications and requested that I call her to review as well. Advised that I would reach out to her and then call her back with any updates. She was appreciative for the call with no further questions at this time.

## 2018-07-03 NOTE — Telephone Encounter (Signed)
Spoke with patients case manager and she states that patient has been out of her carvedilol and spironolactone now since last month. She was switched from Bumex to torsemide and so now they want to know if she needs to take that spironolactone. Advised that I would send in refill for carvedilol and would check with provider regarding the spironolactone. She verbalized understanding with no further questions at this time.

## 2018-07-03 NOTE — Telephone Encounter (Signed)
Left voicemail message for Julie Bender case manager to call back regarding patient.

## 2018-07-05 ENCOUNTER — Other Ambulatory Visit: Payer: Self-pay | Admitting: Cardiovascular Disease

## 2018-07-08 NOTE — Telephone Encounter (Signed)
Would continue spironolactone 25 daily Need BMP in 2-3 weeks

## 2018-07-10 MED ORDER — SPIRONOLACTONE 25 MG PO TABS: 25.0000 mg | ORAL_TABLET | Freq: Every day | ORAL | 2 refills | Status: DC

## 2018-07-10 NOTE — Telephone Encounter (Signed)
S/w Seth Bake with insurance. She verbalized understanding that patient should continue spironolactone 25 mg daily and get BMET at [facility] in 2-3 weeks. She will call and let the patient know this. Rx sent to pharmacy and BMET order entered.

## 2018-07-20 ENCOUNTER — Telehealth: Payer: Self-pay

## 2018-07-20 NOTE — Telephone Encounter (Signed)
Nurse with [facility] calling stating patient is not been checking her weight and has been sleeping in the recliner for it is easier on her breathing.  She's having a hard time with her COPD and HF   Would like [place] to please call patient to prevent patient into going into ED

## 2018-07-20 NOTE — Telephone Encounter (Signed)
Attempted to call the patient. Message received stating "the wireless customer you are trying is reach is unavailable." No voice mail available.

## 2018-07-26 NOTE — Telephone Encounter (Signed)
I attempted to call the patient.  Message received still "the wireless customer you are trying to reach is unavailable." No voice mail available.

## 2018-07-27 MED ORDER — CARVEDILOL 25 MG PO TABS: ORAL_TABLET | ORAL | 3 refills | Status: DC

## 2018-07-27 NOTE — Telephone Encounter (Signed)
Spoke with Seth Bake at [facility] to review concerns for patient and her health. She states that patient did pick up her carvedilol and spironolactone prescriptions and that she did speak with family and that she is taking them. She reports that she checked with pharmacy and they were picked up. She also states that patient provided multiple reasons for not weighing due to scales, batteries, unable to see, and so forth. She also states that patient has been sleeping in the recliner to help with her breathing. Seth Bake states that she assisted patient with getting her CPAP machine and was not able to tolerate it and that they requested respiratory therapist reach out and help with trying some other masks or settings to see if she would be more compliant. She states that they did provide education on the importance of diet, daily weights, and how this impacts her health. Reviewed that I had reached out and spoke with both patient and her sister and that they confirmed medications and had no other questions. She states that they are also keeping close watch on her health and trying to help her avoid going into the hospital. Advised that we are here in any way to help. I do see that she needs to schedule appointment with [facility] and provided her with their number to make appointment. Reviewed that they usually help with more frequent monitoring. She said that she would reach out and schedule patient appointment and would call patient with that information. Instructed her to please call back if we can assist with anything further for this patient.

## 2018-07-27 NOTE — Telephone Encounter (Signed)
Left voicemail message on identified line for Julie Bender at [facility] to review patient information regarding her call for concern on patient with my number to call back.

## 2018-07-27 NOTE — Telephone Encounter (Signed)
See other telephone encounter. Spoke with Seth Bake over at [facility] case Freight forwarder for patient and she reviewed medications and that patient did pick them up from the pharmacy. She has been in touch with the family regarding her care and is also going to assist with further follow up.

## 2018-07-27 NOTE — Telephone Encounter (Signed)
See other telephone note. Left voicemail message for Seth Bake to please call back to review patient information.

## 2018-12-02 ENCOUNTER — Other Ambulatory Visit: Payer: Self-pay | Admitting: Cardiovascular Disease

## 2018-12-04 ENCOUNTER — Other Ambulatory Visit: Payer: Self-pay | Admitting: Cardiovascular Disease

## 2018-12-11 ENCOUNTER — Other Ambulatory Visit: Payer: Self-pay | Admitting: Cardiovascular Disease

## 2018-12-11 NOTE — Telephone Encounter (Addendum)
Please review for refill and advise. Spoke with patient's sister Kennyth Lose and she states she is being followed by [facility] our most recent contact was an Echocardiogram and an OV with Dr. Rockey Situ 05/06/19

## 2019-01-02 ENCOUNTER — Other Ambulatory Visit: Payer: Self-pay | Admitting: Cardiovascular Disease

## 2019-01-02 NOTE — Telephone Encounter (Signed)
Please review for refill.  

## 2019-02-23 ENCOUNTER — Observation Stay
Admission: EM | Admit: 2019-02-23 | Discharge: 2019-02-25 | Disposition: A | Discharge status: Home / Self Care | Payer: Medicare Other | Attending: Internal Medicine | Admitting: Internal Medicine

## 2019-02-23 ENCOUNTER — Other Ambulatory Visit: Payer: Self-pay

## 2019-02-23 ENCOUNTER — Emergency Department: Payer: Medicare Other

## 2019-02-23 ENCOUNTER — Encounter: Payer: Self-pay | Admitting: *Deleted

## 2019-02-23 DIAGNOSIS — Z7982 Long term (current) use of aspirin: Secondary | ICD-10-CM | POA: Diagnosis not present

## 2019-02-23 DIAGNOSIS — M6281 Muscle weakness (generalized): Secondary | ICD-10-CM | POA: Diagnosis not present

## 2019-02-23 DIAGNOSIS — J441 Chronic obstructive pulmonary disease with (acute) exacerbation: Secondary | ICD-10-CM | POA: Insufficient documentation

## 2019-02-23 DIAGNOSIS — R262 Difficulty in walking, not elsewhere classified: Secondary | ICD-10-CM | POA: Insufficient documentation

## 2019-02-23 DIAGNOSIS — F329 Major depressive disorder, single episode, unspecified: Secondary | ICD-10-CM | POA: Diagnosis not present

## 2019-02-23 DIAGNOSIS — G4733 Obstructive sleep apnea (adult) (pediatric): Secondary | ICD-10-CM | POA: Diagnosis not present

## 2019-02-23 DIAGNOSIS — E875 Hyperkalemia: Secondary | ICD-10-CM | POA: Diagnosis not present

## 2019-02-23 DIAGNOSIS — R0602 Shortness of breath: Secondary | ICD-10-CM | POA: Diagnosis present

## 2019-02-23 DIAGNOSIS — Z79899 Other long term (current) drug therapy: Secondary | ICD-10-CM | POA: Insufficient documentation

## 2019-02-23 DIAGNOSIS — Z7722 Contact with and (suspected) exposure to environmental tobacco smoke (acute) (chronic): Secondary | ICD-10-CM | POA: Diagnosis not present

## 2019-02-23 DIAGNOSIS — G4700 Insomnia, unspecified: Secondary | ICD-10-CM | POA: Insufficient documentation

## 2019-02-23 DIAGNOSIS — F419 Anxiety disorder, unspecified: Secondary | ICD-10-CM | POA: Insufficient documentation

## 2019-02-23 DIAGNOSIS — E11319 Type 2 diabetes mellitus with unspecified diabetic retinopathy without macular edema: Secondary | ICD-10-CM | POA: Insufficient documentation

## 2019-02-23 DIAGNOSIS — J9601 Acute respiratory failure with hypoxia: Secondary | ICD-10-CM | POA: Diagnosis not present

## 2019-02-23 DIAGNOSIS — I422 Other hypertrophic cardiomyopathy: Secondary | ICD-10-CM | POA: Insufficient documentation

## 2019-02-23 DIAGNOSIS — M542 Cervicalgia: Secondary | ICD-10-CM | POA: Diagnosis not present

## 2019-02-23 DIAGNOSIS — E1165 Type 2 diabetes mellitus with hyperglycemia: Secondary | ICD-10-CM | POA: Insufficient documentation

## 2019-02-23 DIAGNOSIS — Z95810 Presence of automatic (implantable) cardiac defibrillator: Secondary | ICD-10-CM | POA: Diagnosis not present

## 2019-02-23 DIAGNOSIS — R2689 Other abnormalities of gait and mobility: Secondary | ICD-10-CM | POA: Diagnosis not present

## 2019-02-23 DIAGNOSIS — E785 Hyperlipidemia, unspecified: Secondary | ICD-10-CM | POA: Diagnosis not present

## 2019-02-23 DIAGNOSIS — I5033 Acute on chronic diastolic (congestive) heart failure: Secondary | ICD-10-CM | POA: Insufficient documentation

## 2019-02-23 DIAGNOSIS — Z8249 Family history of ischemic heart disease and other diseases of the circulatory system: Secondary | ICD-10-CM | POA: Insufficient documentation

## 2019-02-23 DIAGNOSIS — Z1159 Encounter for screening for other viral diseases: Secondary | ICD-10-CM | POA: Diagnosis not present

## 2019-02-23 DIAGNOSIS — I110 Hypertensive heart disease with heart failure: Secondary | ICD-10-CM | POA: Diagnosis not present

## 2019-02-23 DIAGNOSIS — Z794 Long term (current) use of insulin: Secondary | ICD-10-CM | POA: Insufficient documentation

## 2019-02-23 LAB — CBC
HCT: 38 % (ref 36.0–46.0)
Hemoglobin: 11.7 g/dL — ABNORMAL LOW (ref 12.0–15.0)
MCH: 26.4 pg (ref 26.0–34.0)
MCHC: 30.8 g/dL (ref 30.0–36.0)
MCV: 85.8 fL (ref 80.0–100.0)
Platelets: 343 10*3/uL (ref 150–400)
RBC: 4.43 MIL/uL (ref 3.87–5.11)
RDW: 15.9 % — ABNORMAL HIGH (ref 11.5–15.5)
WBC: 8.1 10*3/uL (ref 4.0–10.5)
nRBC: 0 % (ref 0.0–0.2)

## 2019-02-23 LAB — BASIC METABOLIC PANEL
Anion gap: 11 (ref 5–15)
BUN: 21 mg/dL — ABNORMAL HIGH (ref 6–20)
CO2: 21 mmol/L — ABNORMAL LOW (ref 22–32)
Calcium: 9.2 mg/dL (ref 8.9–10.3)
Chloride: 106 mmol/L (ref 98–111)
Creatinine, Ser: 1.06 mg/dL — ABNORMAL HIGH (ref 0.44–1.00)
GFR calc Af Amer: >60 mL/min (ref >60)
GFR calc non Af Amer: 57 mL/min — ABNORMAL LOW (ref >60)
Glucose, Bld: 196 mg/dL — ABNORMAL HIGH (ref 70–99)
Potassium: 5.2 mmol/L — ABNORMAL HIGH (ref 3.5–5.1)
Sodium: 138 mmol/L (ref 135–145)

## 2019-02-23 LAB — URINALYSIS, COMPLETE (UACMP) WITH MICROSCOPIC
Glucose, UA: NEGATIVE mg/dL
Hgb urine dipstick: NEGATIVE
Ketones, ur: 5 mg/dL — AB
Leukocytes,Ua: NEGATIVE
Nitrite: NEGATIVE
Protein, ur: 30 mg/dL — AB
Specific Gravity, Urine: 1.033 — ABNORMAL HIGH (ref 1.005–1.030)
pH: 5 (ref 5.0–8.0)

## 2019-02-23 LAB — BRAIN NATRIURETIC PEPTIDE: B Natriuretic Peptide: 458 pg/mL — ABNORMAL HIGH (ref 0.0–100.0)

## 2019-02-23 LAB — GLUCOSE, CAPILLARY
Glucose-Capillary: 89 mg/dL (ref 70–99)
Glucose-Capillary: 205 mg/dL — ABNORMAL HIGH (ref 70–99)
Glucose-Capillary: 181 mg/dL — ABNORMAL HIGH (ref 70–99)

## 2019-02-23 LAB — TROPONIN I (HIGH SENSITIVITY)
Troponin I (High Sensitivity): 77 ng/L — ABNORMAL HIGH (ref <18)
Troponin I (High Sensitivity): 55 ng/L — ABNORMAL HIGH (ref <18)

## 2019-02-23 LAB — SARS CORONAVIRUS 2 BY RT PCR (HOSPITAL ORDER, PERFORMED IN ~~LOC~~ HOSPITAL LAB): SARS Coronavirus 2: NEGATIVE

## 2019-02-23 MED ORDER — INSULIN ASPART 100 UNIT/ML ~~LOC~~ SOLN
0.0000 [IU] | Freq: Three times a day (TID) | SUBCUTANEOUS | Status: DC
  Administered 2019-02-24: 7 [IU] via SUBCUTANEOUS
  Administered 2019-02-24: 3 [IU] via SUBCUTANEOUS
  Administered 2019-02-24: 7 [IU] via SUBCUTANEOUS
  Administered 2019-02-25: 2 [IU] via SUBCUTANEOUS
  Filled 2019-02-23 (×4): qty 1

## 2019-02-23 MED ORDER — CITALOPRAM HYDROBROMIDE 20 MG PO TABS
20.0000 mg | ORAL_TABLET | Freq: Every day | ORAL | Status: DC
  Administered 2019-02-24 – 2019-02-25 (×2): 20 mg via ORAL
  Filled 2019-02-23 (×2): qty 1

## 2019-02-23 MED ORDER — SODIUM CHLORIDE 0.9% FLUSH
3.0000 mL | Freq: Once | INTRAVENOUS | Status: AC
  Administered 2019-02-23: 3 mL via INTRAVENOUS

## 2019-02-23 MED ORDER — FUROSEMIDE 10 MG/ML IJ SOLN
40.0000 mg | Freq: Once | INTRAMUSCULAR | Status: AC
  Administered 2019-02-23: 40 mg via INTRAVENOUS
  Filled 2019-02-23: qty 4

## 2019-02-23 MED ORDER — BENZONATATE 100 MG PO CAPS: 100.0000 mg | ORAL_CAPSULE | Freq: Three times a day (TID) | ORAL | Status: DC | PRN

## 2019-02-23 MED ORDER — SPIRONOLACTONE 25 MG PO TABS
25.0000 mg | ORAL_TABLET | Freq: Every day | ORAL | Status: DC
  Administered 2019-02-24 – 2019-02-25 (×2): 25 mg via ORAL
  Filled 2019-02-23 (×2): qty 1

## 2019-02-23 MED ORDER — FUROSEMIDE 10 MG/ML IJ SOLN
40.0000 mg | Freq: Every day | INTRAMUSCULAR | Status: DC
  Administered 2019-02-24: 40 mg via INTRAVENOUS
  Filled 2019-02-23: qty 4

## 2019-02-23 MED ORDER — TORSEMIDE 20 MG PO TABS: 20.0000 mg | ORAL_TABLET | Freq: Every day | ORAL | Status: DC

## 2019-02-23 MED ORDER — IPRATROPIUM-ALBUTEROL 0.5-2.5 (3) MG/3ML IN SOLN
3.0000 mL | Freq: Once | RESPIRATORY_TRACT | Status: AC
  Administered 2019-02-23: 3 mL via RESPIRATORY_TRACT
  Filled 2019-02-23: qty 3

## 2019-02-23 MED ORDER — METHYLPREDNISOLONE SODIUM SUCC 125 MG IJ SOLR
125.0000 mg | Freq: Once | INTRAMUSCULAR | Status: AC
  Administered 2019-02-23: 125 mg via INTRAVENOUS
  Filled 2019-02-23: qty 2

## 2019-02-23 MED ORDER — IPRATROPIUM-ALBUTEROL 0.5-2.5 (3) MG/3ML IN SOLN
3.0000 mL | Freq: Four times a day (QID) | RESPIRATORY_TRACT | Status: DC
  Administered 2019-02-23 – 2019-02-24 (×3): 3 mL via RESPIRATORY_TRACT
  Filled 2019-02-23 (×4): qty 3

## 2019-02-23 MED ORDER — VERAPAMIL HCL ER 240 MG PO TBCR
240.0000 mg | EXTENDED_RELEASE_TABLET | Freq: Two times a day (BID) | ORAL | Status: DC
  Administered 2019-02-23 – 2019-02-25 (×4): 240 mg via ORAL
  Filled 2019-02-23 (×5): qty 1

## 2019-02-23 MED ORDER — CARVEDILOL 25 MG PO TABS
25.0000 mg | ORAL_TABLET | Freq: Two times a day (BID) | ORAL | Status: DC
  Administered 2019-02-23 – 2019-02-25 (×4): 25 mg via ORAL
  Filled 2019-02-23 (×4): qty 1

## 2019-02-23 MED ORDER — ENOXAPARIN SODIUM 40 MG/0.4ML ~~LOC~~ SOLN
40.0000 mg | SUBCUTANEOUS | Status: DC
  Administered 2019-02-23 – 2019-02-24 (×2): 40 mg via SUBCUTANEOUS
  Filled 2019-02-23 (×2): qty 0.4

## 2019-02-23 MED ORDER — ATORVASTATIN CALCIUM 20 MG PO TABS
40.0000 mg | ORAL_TABLET | Freq: Every day | ORAL | Status: DC
  Administered 2019-02-24: 40 mg via ORAL
  Filled 2019-02-23: qty 2

## 2019-02-23 MED ORDER — INSULIN GLARGINE 100 UNIT/ML ~~LOC~~ SOLN
40.0000 [IU] | Freq: Every day | SUBCUTANEOUS | Status: DC
  Administered 2019-02-23 – 2019-02-24 (×2): 40 [IU] via SUBCUTANEOUS
  Filled 2019-02-23 (×3): qty 0.4

## 2019-02-23 MED ORDER — INSULIN ASPART 100 UNIT/ML ~~LOC~~ SOLN
0.0000 [IU] | Freq: Every day | SUBCUTANEOUS | Status: DC
  Administered 2019-02-24: 4 [IU] via SUBCUTANEOUS
  Filled 2019-02-23: qty 1

## 2019-02-23 MED ORDER — TRAZODONE HCL 50 MG PO TABS
150.0000 mg | ORAL_TABLET | Freq: Every day | ORAL | Status: DC
  Administered 2019-02-23 – 2019-02-24 (×2): 150 mg via ORAL
  Filled 2019-02-23 (×2): qty 1

## 2019-02-23 MED ORDER — FUROSEMIDE 10 MG/ML IJ SOLN: 60.0000 mg | Freq: Once | INTRAMUSCULAR | Status: DC

## 2019-02-23 MED ORDER — ASPIRIN EC 81 MG PO TBEC
81.0000 mg | DELAYED_RELEASE_TABLET | Freq: Every day | ORAL | Status: DC
  Administered 2019-02-24 – 2019-02-25 (×2): 81 mg via ORAL
  Filled 2019-02-23 (×2): qty 1

## 2019-02-23 MED ORDER — LOSARTAN POTASSIUM 50 MG PO TABS
50.0000 mg | ORAL_TABLET | Freq: Every day | ORAL | Status: DC
  Administered 2019-02-24 – 2019-02-25 (×2): 50 mg via ORAL
  Filled 2019-02-23 (×2): qty 1

## 2019-02-23 MED ORDER — LIRAGLUTIDE 18 MG/3ML ~~LOC~~ SOPN: 0.6000 mg | PEN_INJECTOR | Freq: Every day | SUBCUTANEOUS | Status: DC

## 2019-02-23 NOTE — ED Notes (Signed)
ED TO INPATIENT HANDOFF REPORT  ED Nurse Name and Phone #: Wells Guiles [phone]  S Name/Age/Gender Helane Gunther 58 y.o. female Room/Bed: ED14A/ED14A  Code Status   Code Status: Full Code  Home/SNF/Other Home Patient oriented to: self, place, time and situation Is this baseline? Yes   Triage Complete: Triage complete  Chief Complaint weakness, sob  Triage Note FIRST NURSE NOTE-c/o generalized weakness. C/o SHOB. Unlabored.  No fevers. Pt has HOCM and thinks getting fluid build up. Sats 98%RA  Checked on pt. Still unlabored. NAD. Wait explained   Pt to ED reporting increased weakness, SOB, dry cough and neck pain. Pt reporting hx of CHF with bilateral leg pains x 3 days but no swelling. Pt denies having gained weight but verbalized concerns for fluid on her lungs. Breathing is unlabored and even at this time.    Allergies No Known Allergies  Level of Care/Admitting Diagnosis ED Disposition    ED Disposition Condition [facility] Area: [place] [[id number]]  Level of Care: Telemetry [5]  Covid Evaluation: Confirmed COVID Negative  Diagnosis: Acute respiratory failure with hypoxia [facility]) [[id number]]  Admitting Physician: Lang Snow [id number]  Attending Physician: Rufina Falco ACHIENG [[id number]]  PT Class (Do Not Modify): Observation [104]  PT Acc Code (Do Not Modify): Observation [10022]       B Medical/Surgery History Past Medical History:  Diagnosis Date  . CHF (congestive heart failure) ([place])   . COPD (chronic obstructive pulmonary disease) ([place])   . Diabetes mellitus without complication ([place])   . Diabetic retinopathy ([place])   . History of placement of internal cardiac defibrillator   . Hypertension   . Hypertrophic cardiomyopathy ([place])    Past Surgical History:  Procedure Laterality Date  . CARDIAC DEFIBRILLATOR PLACEMENT    . CHOLECYSTECTOMY       A IV Location/Drains/Wounds Patient Lines/Drains/Airways Status    Active Line/Drains/Airways    Name:   Placement date:   Placement time:   Site:   Days:   Peripheral IV 02/23/19 Right Antecubital   02/23/19    1232    Antecubital   less than 1          Intake/Output Last 24 hours  Intake/Output Summary (Last 24 hours) at 02/23/2019 1946 Last data filed at 02/23/2019 1845 Gross per 24 hour  Intake -  Output 1100 ml  Net -1100 ml    Labs/Imaging Results for orders placed or performed during the hospital encounter of 02/23/19 (from the past 48 hour(s))  Basic metabolic panel     Status: Abnormal   Collection Time: 02/23/19 12:26 PM  Result Value Ref Range   Sodium 138 135 - 145 mmol/L   Potassium 5.2 (H) 3.5 - 5.1 mmol/L   Chloride 106 98 - 111 mmol/L   CO2 21 (L) 22 - 32 mmol/L   Glucose, Bld 196 (H) 70 - 99 mg/dL   BUN 21 (H) 6 - 20 mg/dL   Creatinine, Ser 1.06 (H) 0.44 - 1.00 mg/dL   Calcium 9.2 8.9 - 10.3 mg/dL   GFR calc non Af Amer 57 (L) >60 mL/min   GFR calc Af Amer >60 >60 mL/min   Anion gap 11 5 - 15    Comment: Performed at [facility], [address] [place],  [address]  CBC     Status: Abnormal   Collection Time: 02/23/19 12:26 PM  Result Value Ref Range   WBC 8.1 4.0 - 10.5 K/uL  RBC 4.43 3.87 - 5.11 MIL/uL   Hemoglobin 11.7 (L) 12.0 - 15.0 g/dL   HCT 38.0 36.0 - 46.0 %   MCV 85.8 80.0 - 100.0 fL   MCH 26.4 26.0 - 34.0 pg   MCHC 30.8 30.0 - 36.0 g/dL   RDW 15.9 (H) 11.5 - 15.5 %   Platelets 343 150 - 400 K/uL   nRBC 0.0 0.0 - 0.2 %    Comment: Performed at [facility], [address] [place], [place] [address]  Urinalysis, Complete w Microscopic     Status: Abnormal   Collection Time: 02/23/19 12:26 PM  Result Value Ref Range   Color, Urine AMBER (A) YELLOW    Comment: BIOCHEMICALS MAY BE AFFECTED BY COLOR   APPearance CLOUDY (A) CLEAR   Specific Gravity, Urine 1.033 (H) 1.005 - 1.030   pH 5.0 5.0 - 8.0   Glucose, UA NEGATIVE NEGATIVE mg/dL   Hgb urine dipstick NEGATIVE NEGATIVE    Bilirubin Urine SMALL (A) NEGATIVE   Ketones, ur 5 (A) NEGATIVE mg/dL   Protein, ur 30 (A) NEGATIVE mg/dL   Nitrite NEGATIVE NEGATIVE   Leukocytes,Ua NEGATIVE NEGATIVE   RBC / HPF 0-5 0 - 5 RBC/hpf   WBC, UA 0-5 0 - 5 WBC/hpf   Bacteria, UA RARE (A) NONE SEEN   Squamous Epithelial / LPF 6-10 0 - 5   Mucus PRESENT    Hyaline Casts, UA PRESENT     Comment: Performed at [facility], [address] [place], [place] [address]  Troponin I (High Sensitivity)     Status: Abnormal   Collection Time: 02/23/19  1:15 PM  Result Value Ref Range   Troponin I (High Sensitivity) 77 (H) <18 ng/L    Comment: (NOTE) Elevated high sensitivity troponin I (hsTnI) values and significant  changes across serial measurements may suggest ACS but many other  chronic and acute conditions are known to elevate hsTnI results.  Refer to the "Links" section for chest pain algorithms and additional  guidance. Performed at [facility], [address] [place], [place] [address]   Brain natriuretic peptide     Status: Abnormal   Collection Time: 02/23/19  1:15 PM  Result Value Ref Range   B Natriuretic Peptide 458.0 (H) 0.0 - 100.0 pg/mL    Comment: Performed at [facility], [address], [place], [place] [address]  SARS Coronavirus 2 (CEPHEID- Performed in [place] hospital lab), Hosp Order     Status: None   Collection Time: 02/23/19  2:14 PM   Specimen: Nasopharyngeal Swab  Result Value Ref Range   SARS Coronavirus 2 NEGATIVE NEGATIVE    Comment: (NOTE) If result is NEGATIVE SARS-CoV-2 target nucleic acids are NOT DETECTED. The SARS-CoV-2 RNA is generally detectable in upper and lower  respiratory specimens during the acute phase of infection. The lowest  concentration of SARS-CoV-2 viral copies this assay can detect is 250  copies / mL. A negative result does not preclude SARS-CoV-2 infection  and should not be used as the sole basis for treatment or other  patient  management decisions.  A negative result may occur with  improper specimen collection / handling, submission of specimen other  than nasopharyngeal swab, presence of viral mutation(s) within the  areas targeted by this assay, and inadequate number of viral copies  (<250 copies / mL). A negative result must be combined with clinical  observations, patient history, and epidemiological information. If result is POSITIVE SARS-CoV-2 target  nucleic acids are DETECTED. The SARS-CoV-2 RNA is generally detectable in upper and lower  respiratory specimens dur ing the acute phase of infection.  Positive  results are indicative of active infection with SARS-CoV-2.  Clinical  correlation with patient history and other diagnostic information is  necessary to determine patient infection status.  Positive results do  not rule out bacterial infection or co-infection with other viruses. If result is PRESUMPTIVE POSTIVE SARS-CoV-2 nucleic acids MAY BE PRESENT.   A presumptive positive result was obtained on the submitted specimen  and confirmed on repeat testing.  While 2019 novel coronavirus  (SARS-CoV-2) nucleic acids may be present in the submitted sample  additional confirmatory testing may be necessary for epidemiological  and / or clinical management purposes  to differentiate between  SARS-CoV-2 and other Sarbecovirus currently known to infect humans.  If clinically indicated additional testing with an alternate test  methodology [phone]) is advised. The SARS-CoV-2 RNA is generally  detectable in upper and lower respiratory sp ecimens during the acute  phase of infection. The expected result is Negative. Fact Sheet for Patients:  StrictlyIdeas.no Fact Sheet for Healthcare Providers: BankingDealers.co.za This test is not yet approved or cleared by the [place] FDA and has been authorized for detection and/or diagnosis of SARS-CoV-2 by FDA under  an Emergency Use Authorization (EUA).  This EUA will remain in effect (meaning this test can be used) for the duration of the COVID-19 declaration under Section 564(b)(1) of the Act, 21 U.S.C. section 360bbb-3(b)(1), unless the authorization is terminated or revoked sooner. Performed at [facility], [address] [place], [place] [address]   Glucose, capillary     Status: None   Collection Time: 02/23/19  5:32 PM  Result Value Ref Range   Glucose-Capillary 89 70 - 99 mg/dL  Troponin I (High Sensitivity)     Status: Abnormal   Collection Time: 02/23/19  5:38 PM  Result Value Ref Range   Troponin I (High Sensitivity) 55 (H) <18 ng/L    Comment: (NOTE) Elevated high sensitivity troponin I (hsTnI) values and significant  changes across serial measurements may suggest ACS but many other  chronic and acute conditions are known to elevate hsTnI results.  Refer to the "Links" section for chest pain algorithms and additional  guidance. Performed at [facility], [address] [place], [place] [address]    Dg Chest Portable 1 View  Result Date: 02/23/2019 CLINICAL DATA:  Increased weakness.  Shortness of breath and cough. EXAM: PORTABLE CHEST 1 VIEW COMPARISON:  None. FINDINGS: A 2 lead AICD device is again identified. The cardiomediastinal silhouette is stable. No pneumothorax. No nodules or masses. No focal infiltrates. IMPRESSION: No active disease. Electronically Signed   By: Dorise Bullion III M.D   On: 02/23/2019 14:05    Pending Labs Unresulted Labs (From admission, onward)    Start     Ordered   02/23/19 1916  HIV antibody  Once,   STAT     02/23/19 1918          Vitals/Pain Today's Vitals   02/23/19 1745 02/23/19 1800 02/23/19 1815 02/23/19 1830  BP:  (!) 155/78  (!) 146/79  Pulse:      Resp: 18 (!) 21 17 20   Temp:      TempSrc:      SpO2:      Weight:      Height:      PainSc:        Isolation Precautions  Airborne and Contact  precautions  Medications Medications  aspirin EC tablet 81 mg (has no administration in time range)  atorvastatin (LIPITOR) tablet 40 mg (has no administration in time range)  carvedilol (COREG) tablet 25 mg (has no administration in time range)  losartan (COZAAR) tablet 50 mg (has no administration in time range)  spironolactone (ALDACTONE) tablet 25 mg (has no administration in time range)  verapamil (CALAN-SR) CR tablet 240 mg (has no administration in time range)  citalopram (CELEXA) tablet 20 mg (has no administration in time range)  traZODone (DESYREL) tablet 150 mg (has no administration in time range)  insulin glargine (LANTUS) injection 40 Units (has no administration in time range)  liraglutide (VICTOZA) SOPN 0.6 mg (has no administration in time range)  benzonatate (TESSALON) capsule 100 mg (has no administration in time range)  insulin aspart (novoLOG) injection 0-9 Units (has no administration in time range)  insulin aspart (novoLOG) injection 0-5 Units (has no administration in time range)  ipratropium-albuterol (DUONEB) 0.5-2.5 (3) MG/3ML nebulizer solution 3 mL (has no administration in time range)  enoxaparin (LOVENOX) injection 40 mg (has no administration in time range)  furosemide (LASIX) injection 40 mg (has no administration in time range)  sodium chloride flush (NS) 0.9 % injection 3 mL (3 mLs Intravenous Given 02/23/19 1230)  ipratropium-albuterol (DUONEB) 0.5-2.5 (3) MG/3ML nebulizer solution 3 mL (3 mLs Nebulization Given 02/23/19 1445)  ipratropium-albuterol (DUONEB) 0.5-2.5 (3) MG/3ML nebulizer solution 3 mL (3 mLs Nebulization Given 02/23/19 1445)  furosemide (LASIX) injection 40 mg (40 mg Intravenous Given 02/23/19 1445)  methylPREDNISolone sodium succinate (SOLU-MEDROL) 125 mg/2 mL injection 125 mg (125 mg Intravenous Given 02/23/19 1854)  ipratropium-albuterol (DUONEB) 0.5-2.5 (3) MG/3ML nebulizer solution 3 mL (3 mLs Nebulization Given 02/23/19 1854)     Mobility walks Low fall risk   Focused Assessments Cardiac Assessment Handoff:  Cardiac Rhythm: Normal sinus rhythm Lab Results  Component Value Date   TROPONINI 0.34 ([place]) 04/18/2018   No results found for: DDIMER Does the Patient currently have chest pain? No     R Recommendations: See Admitting Provider Note  Report given to:   Additional Notes:

## 2019-02-23 NOTE — ED Triage Notes (Addendum)
FIRST NURSE NOTE-c/o generalized weakness. C/o SHOB. Unlabored.  No fevers. Pt has HOCM and thinks getting fluid build up. Sats 98%RA

## 2019-02-23 NOTE — H&P (Signed)
[facility] [place] NAME: Julie Bender    MR#:  [id number]  DATE OF BIRTH:  Mar 05, 1961  DATE OF ADMISSION:  02/23/2019  PRIMARY CARE PHYSICIAN: [facility] [place] [facility]   REQUESTING/REFERRING PHYSICIAN: Merlyn Lot, MD  CHIEF COMPLAINT:   Chief Complaint  Patient presents with  . Weakness  . Neck Pain  . Shortness of Breath    HISTORY OF PRESENT ILLNESS:  58 y.o. female with pertinent past medical history of CHF, COPD, diabetes mellitus, diabetic retinopathy, hypertension, cardiac defibrillator in place, depression and anxiety, morbid obesity, and OSA presenting to the ED with chief complaint of increased weakness, shortness of breath, dry cough and neck pain.  Patient reports symptoms been progressive over the past 3 days associated with a dry cough.  Denies fevers or chills, nausea vomiting, chest pain, diaphoresis, or any other associated symptoms.  Patient is concerned that she may be getting fluid buildup due to worsening symptoms she therefore presented to the ED for further evaluation.  On arrival to the ED, he was afebrile with blood pressure 93/9mm Hg and pulse rate 81 beats/min. Bp now improved at  146/79. There were no focal neurological deficits; she was alert and oriented x4 in mild acute distress.  Initial labs revealed slightly elevated K+ 5.2, glucose 196, BUN 21, creatinine 1.06, CBC unremarkable UA negative for UTI.  BNP 558 improved from 875 about 10 months ago, troponin (high-sensitivity) 55 improved from 77.  X-ray shows no active cardiopulmonary disease.  Patient will be admitted under hospitalist service for observation.  PAST MEDICAL HISTORY:   Past Medical History:  Diagnosis Date  . CHF (congestive heart failure) ([place])   . COPD (chronic obstructive pulmonary disease) ([place])   . Diabetes mellitus without complication ([place])   . Diabetic retinopathy ([place])   . History of placement of internal cardiac  defibrillator   . Hypertension   . Hypertrophic cardiomyopathy ([place])     PAST SURGICAL HISTORY:   Past Surgical History:  Procedure Laterality Date  . CARDIAC DEFIBRILLATOR PLACEMENT    . CHOLECYSTECTOMY      SOCIAL HISTORY:   Social History   Tobacco Use  . Smoking status: Passive Smoke Exposure - Never Smoker  . Smokeless tobacco: Never Used  Substance Use Topics  . Alcohol use: No    FAMILY HISTORY:   Family History  Problem Relation Age of Onset  . Hypertrophic cardiomyopathy Sister     DRUG ALLERGIES:  No Known Allergies  REVIEW OF SYSTEMS:   Review of Systems  Constitutional: Negative for chills, fever, malaise/fatigue and weight loss.  HENT: Negative for congestion, hearing loss and sore throat.   Eyes: Negative for blurred vision and double vision.  Respiratory: Positive for cough and shortness of breath. Negative for wheezing.   Cardiovascular: Negative for chest pain, palpitations, orthopnea and leg swelling.  Gastrointestinal: Negative for abdominal pain, diarrhea, nausea and vomiting.  Genitourinary: Negative for dysuria and urgency.  Musculoskeletal: Positive for neck pain. Negative for myalgias.  Skin: Negative for rash.  Neurological: Positive for weakness. Negative for dizziness, sensory change, speech change, focal weakness and headaches.  Psychiatric/Behavioral: Negative for depression.   MEDICATIONS AT HOME:   Prior to Admission medications   Medication Sig Start Date End Date Taking? Authorizing Provider  albuterol (PROVENTIL HFA;VENTOLIN HFA) 108 (90 Base) MCG/ACT inhaler Inhale 2 puffs into the lungs every 6 (six) hours as needed for wheezing or shortness of breath. 04/20/18   Salary,  Montell D, MD  aspirin EC 81 MG tablet Take 81 mg by mouth daily.    [provider]  atorvastatin (LIPITOR) 40 MG tablet Take 1 tablet (40 mg total) by mouth daily at 6 PM. 04/20/18   Salary, Avel Peace, MD  benzonatate (TESSALON) 100 MG capsule Take 1  capsule (100 mg total) by mouth 3 (three) times daily as needed for cough. 09/11/16   Demetrios Loll, MD  carvedilol (COREG) 25 MG tablet TAKE 1 TABLET(25 MG) BY MOUTH TWICE DAILY 07/27/18   Minna Merritts, MD  citalopram (CELEXA) 20 MG tablet Take 20 mg by mouth daily. 05/03/16 05/05/18  [provider]  insulin aspart (NOVOLOG FLEXPEN) 100 UNIT/ML FlexPen Inject 12 Units into the skin 3 (three) times daily. 08/17/16   [provider]  insulin glargine (LANTUS) 100 UNIT/ML injection Inject 40 Units into the skin at bedtime. 04/02/16   [provider]  losartan (COZAAR) 50 MG tablet Take 1 tablet (50 mg total) by mouth daily. 05/05/18   Minna Merritts, MD  metFORMIN (GLUCOPHAGE) 850 MG tablet Take 850 mg by mouth 3 (three) times daily. 05/03/16 05/10/18  [provider]  spironolactone (ALDACTONE) 25 MG tablet TAKE 1 TABLET(25 MG) BY MOUTH DAILY 12/05/18   Minna Merritts, MD  torsemide (DEMADEX) 20 MG tablet TAKE 3 TABLETS(60 MG) BY MOUTH DAILY 01/02/19   Minna Merritts, MD  traZODone (DESYREL) 150 MG tablet Take 150 mg by mouth at bedtime. 04/02/16   [provider]  verapamil (CALAN-SR) 240 MG CR tablet TAKE 1 TABLET(240 MG) BY MOUTH TWICE DAILY 12/11/18   Minna Merritts, MD  VICTOZA 18 MG/3ML SOPN Inject 0.6 mg into the skin daily. 02/27/18   [provider]      VITAL SIGNS:  Blood pressure (!) 146/79, pulse 69, temperature 98.2 F (36.8 C), temperature source Oral, resp. rate 20, height 5\' 4"  (1.626 m), weight 97.1 kg, SpO2 92 %.  PHYSICAL EXAMINATION:   Physical Exam  GENERAL:  58 y.o.-year-old patient lying in the bed with no acute distress.  EYES: Pupils equal, round, reactive to light and accommodation. No scleral icterus. Extraocular muscles intact.  HEENT: Head atraumatic, normocephalic. Oropharynx and nasopharynx clear.  NECK:  Supple, no jugular venous distention. No thyroid enlargement, no tenderness.  LUNGS: Normal breath  sounds bilaterally, no wheezing, rales,rhonchi or crepitation. No use of accessory muscles of respiration.  CARDIOVASCULAR: S1, S2 normal. No murmurs, rubs, or gallops.  ABDOMEN: Soft, nontender, nondistended. Bowel sounds present. No organomegaly or mass.  EXTREMITIES: No pedal edema, cyanosis, or clubbing. No rash or lesions. + pedal pulses MUSCULOSKELETAL: Normal bulk, and power was 5+ grip and elbow, knee, and ankle flexion and extension bilaterally.  NEUROLOGIC:Alert and oriented x 3. CN 2-12 intact. Sensation to light touch and cold stimuli intact bilaterally. Finger to nose nl. Babinski is downgoing. DTR's (biceps, patellar, and achilles) 2+ and symmetric throughout. Gait not tested due to safety concern. PSYCHIATRIC: The patient is alert and oriented x 3.  SKIN: No obvious rash, lesion, or ulcer.   DATA REVIEWED:  LABORATORY PANEL:   CBC Recent Labs  Lab 02/23/19 1226  WBC 8.1  HGB 11.7*  HCT 38.0  PLT 343   ------------------------------------------------------------------------------------------------------------------  Chemistries  Recent Labs  Lab 02/23/19 1226  NA 138  K 5.2*  CL 106  CO2 21*  GLUCOSE 196*  BUN 21*  CREATININE 1.06*  CALCIUM 9.2   ------------------------------------------------------------------------------------------------------------------  Cardiac Enzymes No results for  input(s): TROPONINI in the last 168 hours. ------------------------------------------------------------------------------------------------------------------  RADIOLOGY:  Dg Chest Portable 1 View  Result Date: 02/23/2019 CLINICAL DATA:  Increased weakness.  Shortness of breath and cough. EXAM: PORTABLE CHEST 1 VIEW COMPARISON:  None. FINDINGS: A 2 lead AICD device is again identified. The cardiomediastinal silhouette is stable. No pneumothorax. No nodules or masses. No focal infiltrates. IMPRESSION: No active disease. Electronically Signed   By: Dorise Bullion III M.D    On: 02/23/2019 14:05    EKG:  EKG: unchanged from previous tracings, Atrial paced. Vent. rate 55 BPM PR interval 202 ms QRS duration 114 ms QT/QTc 468/447 ms P-R-T axes 66 -19 -23 IMPRESSION AND PLAN:   58 y.o. female with pertinent past medical history of CHF, COPD, diabetes mellitus, diabetic retinopathy, hypertension, cardiac defibrillator in place, depression and anxiety, morbid obesity, and OSA presenting to the ED with chief complaint of increased weakness, shortness of breath, dry cough and neck pain.  1. Acute respiratory failure with hypoxia -likely secondary to COPD with underlying CHF - Admit to telemetry unit  2. 1. Acute on chronic Diastolic Congestive Heart Failure: Acute presentation likely due to volume overload with associated symptoms of SOB, BLE edema and abdominal distension. BNP elevated at 458   Last Echo 04/2018 with  EF 55-60% - Continue Losartan - Beta-Blockade: Coreg - Continue Spironolactone  - Diuretics: Furosemide 40 IV. Diureses >1L negative per day until approach euvolemia / worsening renal function. - Low salt diet  - Check daily weight - Strict I&Os - CHF Teaching  2. Acute on chronic COPD-  Mild  signs of exacerbation - patient with hx of COPD does not use oxygen at home - Supplemental O2, goal sat 88-92% - Bronchodilators (albuterol/ipratropium) standing and PRN    4. HLD  + Goal LDL<70 - Atorvastatin 40mg  PO qhs   5. HTN- stable + Goal BP <130/80 -continue losartan, Coreg and verapamil  6. OSA - not on CPAP at home   7. DM - Insulin dependent at home,  on Lantus,Victoza and Metformin at home - Holding  Metformin -Titrate up based on ongoing Sliding Scale needs to maintain - Diabetes educator to follow   8. DVT prophylaxis - Lovenox   All the records are reviewed and case discussed with ED provider. Management plans discussed with the patient, family and they are in agreement.  CODE STATUS: FULL  TOTAL TIME TAKING CARE OF THIS  PATIENT: 50 minutes.    on 02/23/2019 at 7:18 PM  Rufina Falco, DNP, FNP-BC [facility] Nurse Practitioner Between 7am to 6pm - Pager [phone]  After 6pm go to [email or web address] - Proofreader  [facility]  Office  [phone]  CC: Primary care physician; Physicians, [facility]

## 2019-02-23 NOTE — ED Provider Notes (Signed)
[facility] Acute [facility] Emergency Department Provider Note    First MD Initiated Contact with Patient 02/23/19 1246     (approximate)  I have reviewed the triage vital signs and the nursing notes.   HISTORY  Chief Complaint Weakness, Neck Pain, and Shortness of Breath    HPI Julie Bender is a 58 y.o. female below listed past medical history presents the ER for evaluation of exertional shortness of breath and orthopnea and feeling that she is got fluid on her lungs for the past several days.  She is been having intermittent brief chest pain worse in the morning for the past 4 days.  States she does have a history of cardiomyopathy with pacer.  Has been having intermittent dry cough.  No measured fevers.  No nausea or vomiting.  No pain when taking deep inspiration.  States that she sometimes takes inhalers but is not sure whether she is diagnosed with COPD or not despite that being a diagnosis listed in her past medical history.    Past Medical History:  Diagnosis Date  . CHF (congestive heart failure) ([place])   . COPD (chronic obstructive pulmonary disease) ([place])   . Diabetes mellitus without complication ([place])   . Diabetic retinopathy ([place])   . History of placement of internal cardiac defibrillator   . Hypertension   . Hypertrophic cardiomyopathy ([facility])    Family History  Problem Relation Age of Onset  . Hypertrophic cardiomyopathy Sister    Past Surgical History:  Procedure Laterality Date  . CARDIAC DEFIBRILLATOR PLACEMENT    . CHOLECYSTECTOMY     Patient Active Problem List   Diagnosis Date Noted  . Acute respiratory failure with hypoxia ([place]) 02/23/2019  . Obesity 05/10/2018  . Dyspnea 05/05/2018  . Thyroid nodule 05/05/2018  . OSA (obstructive sleep apnea) 04/30/2018  . Hypertensive heart disease with heart failure ([place]) 04/30/2018  . Acute respiratory distress 04/18/2018  . Chronic right shoulder pain 02/27/2018  . Elevated troponin 05/10/2017  .  Bronchitis 09/06/2016  . Demand ischemia ([place]) 09/06/2016  . HOCM (hypertrophic obstructive cardiomyopathy) ([place]) 09/06/2016  . Routine history and physical examination of adult 08/24/2016  . Volume overload 04/20/2016  . Cardiac defibrillator in place 01/30/2014  . Depression 01/30/2014  . Insomnia 11/29/2012  . Essential hypertension 01/20/2011  . Diabetes mellitus ([place]) 10/31/2008      Prior to Admission medications   Medication Sig Start Date End Date Taking? Authorizing Provider  albuterol (PROVENTIL HFA;VENTOLIN HFA) 108 (90 Base) MCG/ACT inhaler Inhale 2 puffs into the lungs every 6 (six) hours as needed for wheezing or shortness of breath. 04/20/18  Yes Salary, Montell D, MD  aspirin EC 81 MG tablet Take 81 mg by mouth daily.   Yes [provider]  atorvastatin (LIPITOR) 40 MG tablet Take 1 tablet (40 mg total) by mouth daily at 6 PM. Patient taking differently: Take 40 mg by mouth daily.  04/20/18  Yes Salary, Avel Peace, MD  carvedilol (COREG) 25 MG tablet TAKE 1 TABLET(25 MG) BY MOUTH TWICE DAILY Patient taking differently: Take 25 mg by mouth 2 (two) times a day.  07/27/18  Yes Minna Merritts, MD  citalopram (CELEXA) 20 MG tablet Take 20 mg by mouth daily. 05/03/16 02/23/19 Yes [provider]  insulin aspart (NOVOLOG FLEXPEN) 100 UNIT/ML FlexPen Inject 13 Units into the skin 3 (three) times daily with meals.    Yes [provider]  insulin detemir (LEVEMIR) 100 UNIT/ML injection Inject 42 Units into the  skin at bedtime.   Yes [provider]  losartan (COZAAR) 50 MG tablet Take 1 tablet (50 mg total) by mouth daily. Patient taking differently: Take 100 mg by mouth daily.  05/05/18  Yes Minna Merritts, MD  metFORMIN (GLUCOPHAGE) 850 MG tablet Take 850 mg by mouth 3 (three) times daily. 05/03/16 02/23/19 Yes [provider]  spironolactone (ALDACTONE) 25 MG tablet TAKE 1 TABLET(25 MG) BY MOUTH DAILY Patient taking differently: Take 25  mg by mouth daily.  12/05/18  Yes Gollan, Kathlene November, MD  torsemide (DEMADEX) 20 MG tablet TAKE 3 TABLETS(60 MG) BY MOUTH DAILY Patient taking differently: Take 60 mg by mouth daily.  01/02/19  Yes Minna Merritts, MD  traZODone (DESYREL) 150 MG tablet Take 150 mg by mouth at bedtime. 04/02/16  Yes [provider]  verapamil (CALAN-SR) 240 MG CR tablet TAKE 1 TABLET(240 MG) BY MOUTH TWICE DAILY Patient taking differently: Take 240 mg by mouth 2 (two) times daily.  12/11/18  Yes Gollan, Kathlene November, MD  VICTOZA 18 MG/3ML SOPN Inject 0.6 mg into the skin daily. 02/27/18  Yes [provider]    Allergies Patient has no known allergies.    Social History Social History   Tobacco Use  . Smoking status: Passive Smoke Exposure - Never Smoker  . Smokeless tobacco: Never Used  Substance Use Topics  . Alcohol use: No  . Drug use: Never    Review of Systems Patient denies headaches, rhinorrhea, blurry vision, numbness, shortness of breath, chest pain, edema, cough, abdominal pain, nausea, vomiting, diarrhea, dysuria, fevers, rashes or hallucinations unless otherwise stated above in HPI. ____________________________________________   PHYSICAL EXAM:  VITAL SIGNS: Vitals:   02/23/19 2055 02/23/19 2133  BP: (!) 153/65   Pulse: 63   Resp:    Temp: 98.4 F (36.9 C)   SpO2: 99% 95%    Constitutional: Alert and oriented.  Eyes: Conjunctivae are normal.  Head: Atraumatic. Nose: No congestion/rhinnorhea. Mouth/Throat: Mucous membranes are moist.   Neck: No stridor. Painless ROM.  Cardiovascular: Normal rate, regular rhythm. Grossly normal heart sounds.  Good peripheral circulation. Respiratory: Normal respiratory effort.  No retractions. Lungs with scattered wheeze and posterior inspiratory crackles Gastrointestinal: Soft and nontender. No distention. No abdominal bruits. No CVA tenderness. Genitourinary:  Musculoskeletal: No lower extremity tenderness nor edema.  No joint  effusions. Neurologic:  Normal speech and language. No gross focal neurologic deficits are appreciated. No facial droop Skin:  Skin is warm, dry and intact. No rash noted. Psychiatric: Mood and affect are normal. Speech and behavior are normal.  ____________________________________________   LABS (all labs ordered are listed, but only abnormal results are displayed)  Results for orders placed or performed during the hospital encounter of 02/23/19 (from the past 24 hour(s))  Basic metabolic panel     Status: Abnormal   Collection Time: 02/23/19 12:26 PM  Result Value Ref Range   Sodium 138 135 - 145 mmol/L   Potassium 5.2 (H) 3.5 - 5.1 mmol/L   Chloride 106 98 - 111 mmol/L   CO2 21 (L) 22 - 32 mmol/L   Glucose, Bld 196 (H) 70 - 99 mg/dL   BUN 21 (H) 6 - 20 mg/dL   Creatinine, Ser 1.06 (H) 0.44 - 1.00 mg/dL   Calcium 9.2 8.9 - 10.3 mg/dL   GFR calc non Af Amer 57 (L) >60 mL/min   GFR calc Af Amer >60 >60 mL/min   Anion gap 11 5 - 15  CBC  Status: Abnormal   Collection Time: 02/23/19 12:26 PM  Result Value Ref Range   WBC 8.1 4.0 - 10.5 K/uL   RBC 4.43 3.87 - 5.11 MIL/uL   Hemoglobin 11.7 (L) 12.0 - 15.0 g/dL   HCT 38.0 36.0 - 46.0 %   MCV 85.8 80.0 - 100.0 fL   MCH 26.4 26.0 - 34.0 pg   MCHC 30.8 30.0 - 36.0 g/dL   RDW 15.9 (H) 11.5 - 15.5 %   Platelets 343 150 - 400 K/uL   nRBC 0.0 0.0 - 0.2 %  Urinalysis, Complete w Microscopic     Status: Abnormal   Collection Time: 02/23/19 12:26 PM  Result Value Ref Range   Color, Urine AMBER (A) YELLOW   APPearance CLOUDY (A) CLEAR   Specific Gravity, Urine 1.033 (H) 1.005 - 1.030   pH 5.0 5.0 - 8.0   Glucose, UA NEGATIVE NEGATIVE mg/dL   Hgb urine dipstick NEGATIVE NEGATIVE   Bilirubin Urine SMALL (A) NEGATIVE   Ketones, ur 5 (A) NEGATIVE mg/dL   Protein, ur 30 (A) NEGATIVE mg/dL   Nitrite NEGATIVE NEGATIVE   Leukocytes,Ua NEGATIVE NEGATIVE   RBC / HPF 0-5 0 - 5 RBC/hpf   WBC, UA 0-5 0 - 5 WBC/hpf   Bacteria, UA RARE (A)  NONE SEEN   Squamous Epithelial / LPF 6-10 0 - 5   Mucus PRESENT    Hyaline Casts, UA PRESENT   Troponin I (High Sensitivity)     Status: Abnormal   Collection Time: 02/23/19  1:15 PM  Result Value Ref Range   Troponin I (High Sensitivity) 77 (H) <18 ng/L  Brain natriuretic peptide     Status: Abnormal   Collection Time: 02/23/19  1:15 PM  Result Value Ref Range   B Natriuretic Peptide 458.0 (H) 0.0 - 100.0 pg/mL  SARS Coronavirus 2 (CEPHEID- Performed in [facility] hospital lab), Hosp Order     Status: None   Collection Time: 02/23/19  2:14 PM   Specimen: Nasopharyngeal Swab  Result Value Ref Range   SARS Coronavirus 2 NEGATIVE NEGATIVE  Glucose, capillary     Status: None   Collection Time: 02/23/19  5:32 PM  Result Value Ref Range   Glucose-Capillary 89 70 - 99 mg/dL  Troponin I (High Sensitivity)     Status: Abnormal   Collection Time: 02/23/19  5:38 PM  Result Value Ref Range   Troponin I (High Sensitivity) 55 (H) <18 ng/L  Glucose, capillary     Status: Abnormal   Collection Time: 02/23/19  9:06 PM  Result Value Ref Range   Glucose-Capillary 205 (H) 70 - 99 mg/dL   ____________________________________________  EKG My review and personal interpretation at Time: 12:21   Indication: sob  Rate: 55  Rhythm: a paced Axis: normal Other: poor r wave progression, nonspecific t wave abn ____________________________________________  RADIOLOGY  I personally reviewed all radiographic images ordered to evaluate for the above acute complaints and reviewed radiology reports and findings.  These findings were personally discussed with the patient.  Please see medical record for radiology report.  ____________________________________________   PROCEDURES  Procedure(s) performed:  Procedures    Critical Care performed: no ____________________________________________   INITIAL IMPRESSION / ASSESSMENT AND PLAN / ED COURSE  Pertinent labs & imaging results that were  available during my care of the patient were reviewed by me and considered in my medical decision making (see chart for details).   DDX: Asthma, copd, CHF, pna, ptx, malignancy, Pe, anemia  Ashantee Deupree is a 58 y.o. who presents to the ED with symptoms as described above.  Somewhat atypical for ACS.  Doubt PE.  Doubt infectious process.  Possible COPD or bronchitis.  Possible CHF.  The patient will be placed on continuous pulse oximetry and telemetry for monitoring.  Laboratory evaluation will be sent to evaluate for the above complaints.     Clinical Course as of Feb 23 2147  Fri Feb 23, 2019  1630 Discussed the case in consultation with Dr. Rockey Situ the patient's cardiologist.  She is not having any pain.  May have had some improvement after the nebulizer suggesting a component of bronchitis.  She had recent cath that did not show any evidence of coronary disease.  Also had recent echocardiogram.  Troponin elevation likely in the setting of the cardiomyopathy.  Difficult to interpret nonspecific T wave changes in the setting of paced rhythm.  May be a component of her borderline elevated potassium.  Will be to repeat troponin and if no significant delta we can have her hold her Aldactone for the week and have her follow-up in cardiology first thing next week.   [PR]    Clinical Course User Index [PR] Merlyn Lot, MD    The patient was evaluated in Emergency Department today for the symptoms described in the history of present illness. He/she was evaluated in the context of the global COVID-19 pandemic, which necessitated consideration that the patient might be at risk for infection with the SARS-CoV-2 virus that causes COVID-19. Institutional protocols and algorithms that pertain to the evaluation of patients at risk for COVID-19 are in a state of rapid change based on information released by regulatory bodies including the CDC and federal and state organizations. These policies and  algorithms were followed during the patient's care in the ED.  As part of my medical decision making, I reviewed the following data within the [facility] notes reviewed and incorporated, Labs reviewed, notes from prior ED visits and [place] Controlled Substance Database   ____________________________________________   FINAL CLINICAL IMPRESSION(S) / ED DIAGNOSES  Final diagnoses:  SOB (shortness of breath)  COPD exacerbation ([place])      NEW MEDICATIONS STARTED DURING THIS VISIT:  Current Discharge Medication List       Note:  This document was prepared using Dragon voice recognition software and may include unintentional dictation errors.    Merlyn Lot, MD 02/23/19 2148

## 2019-02-23 NOTE — ED Triage Notes (Signed)
Pt to ED reporting increased weakness, SOB, dry cough and neck pain. Pt reporting hx of CHF with bilateral leg pains x 3 days but no swelling. Pt denies having gained weight but verbalized concerns for fluid on her lungs. Breathing is unlabored and even at this time.

## 2019-02-23 NOTE — ED Triage Notes (Signed)
Checked on pt. Still unlabored. NAD. Wait explained

## 2019-02-24 DIAGNOSIS — J9601 Acute respiratory failure with hypoxia: Secondary | ICD-10-CM | POA: Diagnosis not present

## 2019-02-24 LAB — GLUCOSE, CAPILLARY
Glucose-Capillary: 244 mg/dL — ABNORMAL HIGH (ref 70–99)
Glucose-Capillary: 345 mg/dL — ABNORMAL HIGH (ref 70–99)
Glucose-Capillary: 378 mg/dL — ABNORMAL HIGH (ref 70–99)
Glucose-Capillary: 328 mg/dL — ABNORMAL HIGH (ref 70–99)
Glucose-Capillary: 317 mg/dL — ABNORMAL HIGH (ref 70–99)

## 2019-02-24 MED ORDER — INSULIN ASPART 100 UNIT/ML ~~LOC~~ SOLN
5.0000 [IU] | Freq: Three times a day (TID) | SUBCUTANEOUS | Status: DC
  Administered 2019-02-24 – 2019-02-25 (×2): 5 [IU] via SUBCUTANEOUS
  Filled 2019-02-24 (×2): qty 1

## 2019-02-24 MED ORDER — FUROSEMIDE 10 MG/ML IJ SOLN
40.0000 mg | Freq: Two times a day (BID) | INTRAMUSCULAR | Status: DC
  Administered 2019-02-24 – 2019-02-25 (×2): 40 mg via INTRAVENOUS
  Filled 2019-02-24 (×2): qty 4

## 2019-02-24 MED ORDER — IPRATROPIUM-ALBUTEROL 0.5-2.5 (3) MG/3ML IN SOLN
3.0000 mL | Freq: Three times a day (TID) | RESPIRATORY_TRACT | Status: DC
  Administered 2019-02-24: 3 mL via RESPIRATORY_TRACT
  Filled 2019-02-24 (×2): qty 3

## 2019-02-24 NOTE — Care Management Obs Status (Signed)
[facility] NOTIFICATION   Patient Details  Name: Julie Bender MRN: [id number] Date of Birth: [birth year]   Medicare Observation Status Notification Given:  Yes    Annalis Kaczmarczyk A Britainy Kozub, RN 02/24/2019, 1:22 PM

## 2019-02-24 NOTE — Evaluation (Signed)
Physical Therapy Evaluation Patient Details Name: Julie Bender MRN: [id number] DOB: 01/02/1961 Today's Date: 02/24/2019   History of Present Illness  Pt admitted for ARF with hypoxia. Pt with complaints of weakness x 3 days. History of CHF, COPD, DM, HTN, obesity, and cardiac defibrillator.   Clinical Impression  Pt is a pleasant 58 year old female who was admitted for ARF with hypoxia. Pt performs bed mobility with mod I and transfers/ambulation with cga and RW. Pt demonstrates deficits with endurance/strength/mobiltiy. Pt reports no falls in last 6 months and is close to baseline level. She is frustrated that her endurance is limited mainly due to SOB with exertion. All sats/HR WNL on RA with exertion, however easily fatigues. She is motivated to participate and is hopeful for improvement. Would benefit from skilled PT to address above deficits and promote optimal return to PLOF. Recommend transition to [place] upon discharge from acute hospitalization.     Follow Up Recommendations Home health PT    Equipment Recommendations  None recommended by PT    Recommendations for Other Services       Precautions / Restrictions Precautions Precautions: Fall Restrictions Weight Bearing Restrictions: No      Mobility  Bed Mobility Overal bed mobility: Modified Independent             General bed mobility comments: using bed rails. Safe  Transfers Overall transfer level: Needs assistance Equipment used: Rolling walker (2 wheeled) Transfers: Sit to/from Stand Sit to Stand: Min guard         General transfer comment: cues for pushing from seated surface. Upright posture noted. All mobility performed on RA  Ambulation/Gait Ambulation/Gait assistance: Min guard Gait Distance (Feet): 15 Feet Assistive device: Rolling walker (2 wheeled) Gait Pattern/deviations: Step-to pattern     General Gait Details: ambulated in room distances on RA with sats WNL. Further gait training in  ther-ex  Stairs            Wheelchair Mobility    Modified Rankin (Stroke Patients Only)       Balance Overall balance assessment: Needs assistance Sitting-balance support: No upper extremity supported Sitting balance-Leahy Scale: Good     Standing balance support: Bilateral upper extremity supported Standing balance-Leahy Scale: Good                               Pertinent Vitals/Pain Pain Assessment: No/denies pain    Home Living Family/patient expects to be discharged to:: Private residence Living Arrangements: Parent;Other relatives(mom and sister- both disabled) Available Help at Discharge: Family Type of Home: House Home Access: Stairs to enter Entrance Stairs-Rails: Left Entrance Stairs-Number of Steps: 3 Home Layout: One level Home Equipment: [place] - 4 wheels;Shower seat      Prior Function Level of Independence: Independent with assistive device(s)         Comments: able to ambulate short distances with no AD and longer distances with rollater. Has hired assistance for housekeeping     Hand Dominance        Extremity/Trunk Assessment   Upper Extremity Assessment Upper Extremity Assessment: Generalized weakness(B UE grossly 4+/5)    Lower Extremity Assessment Lower Extremity Assessment: Generalized weakness(B LE grossly 4/5)       Communication   Communication: No difficulties  Cognition Arousal/Alertness: Awake/alert Behavior During Therapy: WFL for tasks assessed/performed Overall Cognitive Status: Within Functional Limits for tasks assessed  General Comments      Exercises Other Exercises Other Exercises: supine ther-ex performed on B LE including AP, quad sets, and SLR. Pt fatigues quickly needing 2 rest breaks. 10 reps Other Exercises: FUrther ambulation in hallway with close chair follow. Able to ambulate 53' prior to seated rest break. Very slow gait  pattern, with reciprocal gait. Heavy use of B UE on RW. O2 sats at 94% and HR 74 post exertion.   Assessment/Plan    PT Assessment Patient needs continued PT services  PT Problem List Decreased strength;Decreased activity tolerance;Decreased mobility;Cardiopulmonary status limiting activity;Obesity       PT Treatment Interventions Gait training;Therapeutic activities;Therapeutic exercise    PT Goals (Current goals can be found in the Care Plan section)  Acute Rehab PT Goals Patient Stated Goal: to walk more PT Goal Formulation: With patient Time For Goal Achievement: 03/10/19 Potential to Achieve Goals: Good    Frequency Min 2X/week   Barriers to discharge        Co-evaluation               AM-PAC PT "6 Clicks" Mobility  Outcome Measure Help needed turning from your back to your side while in a flat bed without using bedrails?: None Help needed moving from lying on your back to sitting on the side of a flat bed without using bedrails?: None Help needed moving to and from a bed to a chair (including a wheelchair)?: A Little Help needed standing up from a chair using your arms (e.g., wheelchair or bedside chair)?: A Little Help needed to walk in hospital room?: A Little Help needed climbing 3-5 steps with a railing? : A Lot 6 Click Score: 19    End of Session Equipment Utilized During Treatment: Gait belt Activity Tolerance: Patient tolerated treatment well Patient left: in chair;with chair alarm set Nurse Communication: Mobility status PT Visit Diagnosis: Muscle weakness (generalized) (M62.81);Difficulty in walking, not elsewhere classified (R26.2)    Time: 0141-0301 PT Time Calculation (min) (ACUTE ONLY): 38 min   Charges:   PT Evaluation $PT Eval Low Complexity: 1 Low PT Treatments $Gait Training: 8-22 mins $Therapeutic Exercise: 8-22 mins        Greggory Stallion, PT, DPT [phone]   Tykwon Fera 02/24/2019, 10:02 AM

## 2019-02-24 NOTE — Evaluation (Signed)
Occupational Therapy Evaluation Patient Details Name: Julie Bender MRN: [id number] DOB: 09-14-1960 Today's Date: 02/24/2019    History of Present Illness Pt admitted for ARF with hypoxia. Pt with complaints of weakness x 3 days. History of CHF, COPD, DM, HTN, obesity, and cardiac defibrillator.    Clinical Impression   Ms. Limones was seen for OT evaluation this date. Pt was independent in all ADLs and mobility, living in a 1 story home with 3 steps to enter. Pt lives with her her mother and sister. Ms. Marcellus Scott reports she and her sister provide care from her mother who recently had a stroke. Pt denies O2 use in the home, but she reports becoming easily fatigued or out of breath with minimal exertion, requiring a 4WW for community mobility and regular rest breaks during ADL/IADL tasks in the home. Pt currently requires minimal assist for heavy ADL tasks including dressing and bathing due to poor activity tolerance. Pt educated in energy conservation conservation strategies including pursed lip breathing, activity pacing, home/routines modifications, work simplification, AE/DME, prioritizing of meaningful occupations, and falls prevention. Handout provided. Pt verbalized understanding but would benefit from additional skilled OT services to maximize recall and carryover of learned techniques and facilitate implementation of learned techniques into daily routines. Upon discharge, recommend [place] services.       Follow Up Recommendations  Home health OT    Equipment Recommendations  Other (comment)(TBD)    Recommendations for Other Services       Precautions / Restrictions Precautions Precautions: Fall Restrictions Weight Bearing Restrictions: No      Mobility Bed Mobility Overal bed mobility: Modified Independent             General bed mobility comments: using bed rails. Safe  Transfers Overall transfer level: Needs assistance Equipment used: Rolling walker (2  wheeled) Transfers: Sit to/from Stand Sit to Stand: Supervision         General transfer comment: Pt ambulated in room with and without AE on this date. Noted to hold onto bed rails, etc when walking. Pt on RA t/o full evaluation.    Balance Overall balance assessment: Needs assistance Sitting-balance support: No upper extremity supported;Feet supported Sitting balance-Leahy Scale: Good     Standing balance support: Bilateral upper extremity supported;During functional activity Standing balance-Leahy Scale: Good Standing balance comment: Pt noted to lean on sink during oral care. States this is d/t fatigue.                           ADL either performed or assessed with clinical judgement   ADL Overall ADL's : Needs assistance/impaired Eating/Feeding: Sitting;Independent   Grooming: Oral care;Cueing for sequencing;Cueing for compensatory techniques;Independent Grooming Details (indicate cue type and reason): Pt able to complete oral care while standing at sink. Pt required occasional cues for ECS, but was able to complete this task independently. Pt states she feels increased fatigue and SOB. OT provided cueing for PLB before and after oral care. Upper Body Bathing: Set up;Supervision/ safety;Sitting   Lower Body Bathing: Set up;Minimal assistance;Sit to/from stand   Upper Body Dressing : Modified independent;Sitting   Lower Body Dressing: Modified independent;Sit to/from stand   Toilet Transfer: Modified Independent;Ambulation;Regular Glass blower/designer Details (indicate cue type and reason): Pt able to use room commode on this date independently. Transferred without assistance to room bathroom. Increased time/effort to complete task. Toileting- Clothing Manipulation and Hygiene: Modified independent;Sit to/from stand       Functional mobility  during ADLs: Modified independent General ADL Comments: Pt limited by fatigue and SOB during ADLs on this date. Is  able to complete tasks and endorses improved satisfaction when utilizeing PLB t/o. Would benefit from additional education in ECS and AE to support independence.     Vision Baseline Vision/History: Wears glasses Wears Glasses: Reading only Patient Visual Report: No change from baseline       Perception     Praxis      Pertinent Vitals/Pain Pain Assessment: 0-10 Pain Score: 4  Pain Location: Head Pain Descriptors / Indicators: Headache Pain Intervention(s): Limited activity within patient's tolerance;Monitored during session;Utilized relaxation techniques     Hand Dominance Right   Extremity/Trunk Assessment Upper Extremity Assessment Upper Extremity Assessment: Overall WFL for tasks assessed   Lower Extremity Assessment Lower Extremity Assessment: Overall WFL for tasks assessed;Defer to PT evaluation   Cervical / Trunk Assessment Cervical / Trunk Assessment: Normal   Communication Communication Communication: No difficulties   Cognition Arousal/Alertness: Awake/alert Behavior During Therapy: WFL for tasks assessed/performed Overall Cognitive Status: Within Functional Limits for tasks assessed                                     General Comments  Pt SPo2 at 93-94 while seated on RA. Increases to 97 with cues for PLB.    Exercises Exercises: Other exercises Other Exercises Other Exercises: supine ther-ex performed on B LE including AP, quad sets, and SLR. Pt fatigues quickly needing 2 rest breaks. 10 reps Other Exercises: FUrther ambulation in hallway with close chair follow. Able to ambulate 62' prior to seated rest break. Very slow gait pattern, with reciprocal gait. Heavy use of B UE on RW. O2 sats at 94% and HR 74 post exertion. Other Exercises: Pt educated in ECS for improved safety, endurance, and independence during daily routines.   Shoulder Instructions      Home Living Family/patient expects to be discharged to:: Private residence Living  Arrangements: Parent;Other relatives(Sister and mother - both disabled) Available Help at Discharge: Family Type of Home: House Home Access: Stairs to enter [employer] of Steps: 3 Entrance Stairs-Rails: Left Home Layout: One level               Home Equipment: [place] - 4 wheels;Shower seat          Prior Functioning/Environment Level of Independence: Independent with assistive device(s)        Comments: able to ambulate short distances with no AD and longer distances with rollater. Has hired assistance for housekeeping        OT Problem List: Decreased strength;Cardiopulmonary status limiting activity;Decreased range of motion;Decreased activity tolerance;Decreased safety awareness;Decreased knowledge of use of DME or AE      OT Treatment/Interventions: Self-care/ADL training;Therapeutic exercise;Therapeutic activities;Energy conservation;DME and/or AE instruction;Patient/family education    OT Goals(Current goals can be found in the care plan section) Acute Rehab OT Goals Patient Stated Goal: To be able to go shopping again. OT Goal Formulation: With patient Time For Goal Achievement: 03/10/19 Potential to Achieve Goals: Good ADL Goals Pt Will Perform Upper Body Bathing: sitting;with modified independence(With LRAD PRN for improved safety and functional independence.) Pt Will Perform Lower Body Bathing: with modified independence;sit to/from stand(With LRAD PRN for improved safety and functional independence.) Additional ADL Goal #1: Pt will independently verbalize a plan to implement at least 3 learned ECS into her daily routines/ADLs upon hospital DC for  improved safety, independence, and satisfaction during meaningful occupations of daily life.  OT Frequency: Min 1X/week   Barriers to D/C: Decreased caregiver support;Inaccessible home environment          Co-evaluation              AM-PAC OT "6 Clicks" Daily Activity     Outcome Measure Help  from another person eating meals?: None Help from another person taking care of personal grooming?: None Help from another person toileting, which includes using toliet, bedpan, or urinal?: A Little Help from another person bathing (including washing, rinsing, drying)?: A Little Help from another person to put on and taking off regular upper body clothing?: None Help from another person to put on and taking off regular lower body clothing?: A Little 6 Click Score: 21   End of Session Equipment Utilized During Treatment: Gait belt;Rolling walker  Activity Tolerance: Patient tolerated treatment well Patient left: in chair;with call bell/phone within reach;with chair alarm set  OT Visit Diagnosis: Other abnormalities of gait and mobility (R26.89)                Time: 6010-9323 OT Time Calculation (min): 44 min Charges:  OT General Charges $OT Visit: 1 Visit OT Evaluation $OT Eval Low Complexity: 1 Low OT Treatments $Self Care/Home Management : 23-37 mins  Shara Blazing, M.S., OTR/L Ascom: [phone] 02/24/19, 12:03 PM

## 2019-02-24 NOTE — Progress Notes (Signed)
[facility] [place] NAME: Julie Bender    MR#:  [id number]  DATE OF BIRTH:  1960-08-23  SUBJECTIVE:  CHIEF COMPLAINT:   Chief Complaint  Patient presents with  . Weakness  . Neck Pain  . Shortness of Breath  The patient still has shortness breath on exertion, she was on oxygen by nasal cannula 1 L this morning.  Then off oxygen. REVIEW OF SYSTEMS:  Review of Systems  Constitutional: Positive for malaise/fatigue. Negative for chills and fever.  HENT: Negative for sore throat.   Eyes: Negative for blurred vision and double vision.  Respiratory: Positive for cough and shortness of breath. Negative for hemoptysis, sputum production, wheezing and stridor.   Cardiovascular: Negative for chest pain, palpitations, orthopnea and leg swelling.  Gastrointestinal: Negative for abdominal pain, blood in stool, diarrhea, melena, nausea and vomiting.  Genitourinary: Negative for dysuria, flank pain and hematuria.  Musculoskeletal: Negative for back pain and joint pain.  Neurological: Negative for dizziness, sensory change, focal weakness, seizures, loss of consciousness, weakness and headaches.  Endo/Heme/Allergies: Negative for polydipsia.  Psychiatric/Behavioral: Negative for depression. The patient is not nervous/anxious.     DRUG ALLERGIES:  No Known Allergies VITALS:  Blood pressure 128/72, pulse 63, temperature 97.8 F (36.6 C), temperature source Oral, resp. rate 19, height 5\' 4"  (1.626 m), weight 97.1 kg, SpO2 99 %. PHYSICAL EXAMINATION:  Physical Exam Constitutional:      General: She is not in acute distress.    Appearance: She is obese.  HENT:     Head: Normocephalic.     Mouth/Throat:     Mouth: Mucous membranes are moist.  Eyes:     General: No scleral icterus.    Conjunctiva/sclera: Conjunctivae normal.     Pupils: Pupils are equal, round, and reactive to light.  Neck:     Musculoskeletal: Normal range of motion and neck  supple.     Vascular: No JVD.     Trachea: No tracheal deviation.  Cardiovascular:     Rate and Rhythm: Normal rate and regular rhythm.     Heart sounds: Normal heart sounds. No murmur. No gallop.   Pulmonary:     Effort: Pulmonary effort is normal. No respiratory distress.     Breath sounds: Normal breath sounds. No wheezing or rales.  Abdominal:     General: Bowel sounds are normal. There is no distension.     Palpations: Abdomen is soft.     Tenderness: There is no abdominal tenderness. There is no rebound.  Musculoskeletal: Normal range of motion.        General: No tenderness.     Right lower leg: No edema.     Left lower leg: No edema.  Skin:    Findings: No erythema or rash.  Neurological:     General: No focal deficit present.     Mental Status: She is alert and oriented to person, place, and time.     Cranial Nerves: No cranial nerve deficit.  Psychiatric:        Mood and Affect: Mood normal.    LABORATORY PANEL:  Female CBC Recent Labs  Lab 02/23/19 1226  WBC 8.1  HGB 11.7*  HCT 38.0  PLT 343   ------------------------------------------------------------------------------------------------------------------ Chemistries  Recent Labs  Lab 02/23/19 1226  NA 138  K 5.2*  CL 106  CO2 21*  GLUCOSE 196*  BUN 21*  CREATININE 1.06*  CALCIUM 9.2   RADIOLOGY:  No results  found. ASSESSMENT AND PLAN:   58 y.o. female with pertinent past medical history of CHF, COPD, diabetes mellitus, diabetic retinopathy, hypertension, cardiac defibrillator in place, depression and anxiety, morbid obesity, and OSA presenting to the ED with chief complaint of increased weakness, shortness of breath, dry cough and neck pain.  1. Acute respiratory failure with hypoxia -likely secondary to COPD with underlying CHF Off oxygen, continue nebulizer PRN.  The patient was treated with 1 dose IV Solu-Medrol.  2. 1. Acute on chronicDiastolic Congestive Heart Failure: Acute presentation  likely due to volume overload with associated symptoms of SOB, BLE edema and abdominal distension. BNP elevated at 458  Last Echo 04/2018 withEF 55-60% - Continue Lasix IV Q12H, continue losartan, Coreg and spironolactone.  2. COPD exacerbation- Mild  signs of exacerbation - Bronchodilators (albuterol/ipratropium) standing and PRN  4.HLD  + Goal LDL<70 - Atorvastatin 40mg  PO qhs  5.HTN- stable + Goal BP <130/80 -continue losartan, Coreg and verapamil  6. OSA - not on CPAP at home  7. Hyperglycemia and DM -Insulindependent at home, on Lantus,Victoza and Metformin at home - Holding  Metformin, continue sliding scale, continue Lantus, add NovoLog 5 units AC.  Hyperkalemia.  Mild, follow-up BMP. Generalized weakness.  PT OT evaluation suggest home health with PT and OT. All the records are reviewed and case discussed with Care Management/Social Worker. Management plans discussed with the patient, family and they are in agreement.  CODE STATUS: Full Code  TOTAL TIME TAKING CARE OF THIS PATIENT: 27 minutes.   More than 50% of the time was spent in counseling/coordination of care: YES  POSSIBLE D/C IN 1-2 DAYS, DEPENDING ON CLINICAL CONDITION.   Demetrios Loll M.D on 02/24/2019 at 3:46 PM  Between 7am to 6pm - Pager - [phone]  After 6pm go to [email or web address] - [facility]

## 2019-02-25 DIAGNOSIS — J9601 Acute respiratory failure with hypoxia: Secondary | ICD-10-CM | POA: Diagnosis not present

## 2019-02-25 LAB — BASIC METABOLIC PANEL
Anion gap: 7 (ref 5–15)
BUN: 30 mg/dL — ABNORMAL HIGH (ref 6–20)
CO2: 24 mmol/L (ref 22–32)
Calcium: 8.9 mg/dL (ref 8.9–10.3)
Chloride: 106 mmol/L (ref 98–111)
Creatinine, Ser: 0.82 mg/dL (ref 0.44–1.00)
GFR calc Af Amer: >60 mL/min (ref >60)
GFR calc non Af Amer: >60 mL/min (ref >60)
Glucose, Bld: 257 mg/dL — ABNORMAL HIGH (ref 70–99)
Potassium: 4.4 mmol/L (ref 3.5–5.1)
Sodium: 137 mmol/L (ref 135–145)

## 2019-02-25 LAB — GLUCOSE, CAPILLARY
Glucose-Capillary: 190 mg/dL — ABNORMAL HIGH (ref 70–99)
Glucose-Capillary: 209 mg/dL — ABNORMAL HIGH (ref 70–99)

## 2019-02-25 LAB — MAGNESIUM: Magnesium: 2.2 mg/dL (ref 1.7–2.4)

## 2019-02-25 NOTE — TOC Transition Note (Signed)
Transition of Care [facility]) - CM/SW Discharge Note   Patient Details  Name: Julie Bender MRN: [id number] Date of Birth: 02-22-1961  [facility] CM/SW Contact:  Latanya Maudlin, RN Phone Number: 02/25/2019, 10:22 AM   Clinical Narrative:  Patient to be discharged per MD order. Orders in place for home health services. CMS Medicare.gov Compare Post Acute Care list reviewed with patient and she has no preference of agency. Referral placed with Malachy Mood at [facility]. No DME needs. Family to transport.      Final next level of care: Home w Home Health Services Barriers to Discharge: No Barriers Identified   Patient Goals and CMS Choice   CMS Medicare.gov Compare Post Acute Care list provided to:: Patient Choice offered to / list presented to : Patient  Discharge Placement                       Discharge Plan and Services                          HH Arranged: RN, PT, OT [facility] Agency: [place] Date [place]: 02/25/19 Time [place]: 1022    Social Determinants of Health (SDOH) Interventions     Readmission Risk Interventions Readmission Risk Prevention Plan 04/20/2018  Transportation Screening Complete  PCP or Specialist Appt within 5-7 Days Complete  Home Care Screening Complete  Medication Review (RN CM) Complete

## 2019-02-25 NOTE — Discharge Summary (Signed)
[facility] NAME: Julie Bender    MR#:  [id number]  DATE OF BIRTH:  [birth year]  DATE OF ADMISSION:  02/23/2019   ADMITTING PHYSICIAN: Lang Snow, NP  DATE OF DISCHARGE: 02/25/2019 PRIMARY CARE PHYSICIAN: [facility]   ADMISSION DIAGNOSIS:  SOB (shortness of breath) [R06.02] COPD exacerbation (HCC) [J44.1] DISCHARGE DIAGNOSIS:  Active Problems:   Acute respiratory failure with hypoxia ([place])  SECONDARY DIAGNOSIS:   Past Medical History:  Diagnosis Date  . CHF (congestive heart failure) ([place])   . COPD (chronic obstructive pulmonary disease) ([place])   . Diabetes mellitus without complication ([place])   . Diabetic retinopathy ([place])   . History of placement of internal cardiac defibrillator   . Hypertension   . Hypertrophic cardiomyopathy ([place])    HOSPITAL COURSE:  58 y.o.femalewith pertinent past medical history ofCHF, COPD, diabetes mellitus, diabetic retinopathy, hypertension, cardiac defibrillator in place, depression and anxiety, morbid obesity, and OSA presenting to the ED with chief complaint of increased weakness, shortness of breath, dry cough and neck pain.  1.Acute respiratory failure with hypoxia-likely secondary to COPD with underlying CHF Off oxygen, continue nebulizer PRN.  The patient was treated with 1 dose IV Solu-Medrol.  2. Acute on chronicDiastolicCongestive Heart Failure: Acute presentation likely due to volume overload with associated symptoms of SOB, BLE edema and abdominal distension. BNP elevated at458 Last Echo9/2019 withEF55-60% The patient has been treated with Lasix IV Q12H, change to home torsemide 60 mg daily after discharge, continue losartan,Coreg and spironolactone.  2.COPD exacerbation-Mildsigns of exacerbation - Bronchodilators (albuterol/ipratropium) standing and PRN  4.HLD  + Goal LDL<70 - Atorvastatin40mg  PO qhs  5.HTN- stable +  Goal BP <130/80 -continue losartan, Coreg and verapamil  6. OSA -not onCPAP at home  7. Hyperglycemia and DM -Insulindependent at home, on Lantus,Victozaand Metformin at home - Holding Metformin, continue sliding scale, Lantus and NovoLog 5 units AC.  Resume home medication after discharge.  Hyperkalemia.  Mild, improved. Generalized weakness.  PT OT evaluation suggest home health with PT and OT. DISCHARGE CONDITIONS:  Stable, discharge to home with home health and PT OT today. CONSULTS OBTAINED:   DRUG ALLERGIES:  No Known Allergies DISCHARGE MEDICATIONS:   Allergies as of 02/25/2019   No Known Allergies     Medication List    TAKE these medications   albuterol 108 (90 Base) MCG/ACT inhaler Commonly known as: VENTOLIN HFA Inhale 2 puffs into the lungs every 6 (six) hours as needed for wheezing or shortness of breath.   aspirin EC 81 MG tablet Take 81 mg by mouth daily.   atorvastatin 40 MG tablet Commonly known as: LIPITOR Take 1 tablet (40 mg total) by mouth daily at 6 PM. What changed: when to take this   carvedilol 25 MG tablet Commonly known as: COREG TAKE 1 TABLET(25 MG) BY MOUTH TWICE DAILY What changed:   how much to take  how to take this  when to take this  additional instructions   citalopram 20 MG tablet Commonly known as: CELEXA Take 20 mg by mouth daily.   insulin detemir 100 UNIT/ML injection Commonly known as: LEVEMIR Inject 42 Units into the skin at bedtime.   losartan 50 MG tablet Commonly known as: COZAAR Take 1 tablet (50 mg total) by mouth daily. What changed: how much to take   metFORMIN 850 MG tablet Commonly known as: GLUCOPHAGE Take 850 mg by mouth 3 (three) times daily.   NovoLOG FlexPen  100 UNIT/ML FlexPen Generic drug: insulin aspart Inject 13 Units into the skin 3 (three) times daily with meals.   spironolactone 25 MG tablet Commonly known as: ALDACTONE TAKE 1 TABLET(25 MG) BY MOUTH DAILY What changed:  See the new instructions.   torsemide 20 MG tablet Commonly known as: DEMADEX TAKE 3 TABLETS(60 MG) BY MOUTH DAILY What changed: See the new instructions.   traZODone 150 MG tablet Commonly known as: DESYREL Take 150 mg by mouth at bedtime.   verapamil 240 MG CR tablet Commonly known as: CALAN-SR TAKE 1 TABLET(240 MG) BY MOUTH TWICE DAILY What changed: See the new instructions.   Victoza 18 MG/3ML Sopn Generic drug: liraglutide Inject 0.6 mg into the skin daily.        DISCHARGE INSTRUCTIONS:  See AVS.  If you experience worsening of your admission symptoms, develop shortness of breath, life threatening emergency, suicidal or homicidal thoughts you must seek medical attention immediately by calling 911 or calling your MD immediately  if symptoms less severe.  You Must read complete instructions/literature along with all the possible adverse reactions/side effects for all the Medicines you take and that have been prescribed to you. Take any new Medicines after you have completely understood and accpet all the possible adverse reactions/side effects.   Please note  You were cared for by a hospitalist during your hospital stay. If you have any questions about your discharge medications or the care you received while you were in the hospital after you are discharged, you can call the unit and asked to speak with the hospitalist on call if the hospitalist that took care of you is not available. Once you are discharged, your primary care physician will handle any further medical issues. Please note that NO REFILLS for any discharge medications will be authorized once you are discharged, as it is imperative that you return to your primary care physician (or establish a relationship with a primary care physician if you do not have one) for your aftercare needs so that they can reassess your need for medications and monitor your lab values.    On the day of Discharge:  VITAL SIGNS:   Blood pressure 128/77, pulse (!) 54, temperature 97.6 F (36.4 C), temperature source Oral, resp. rate 19, height 5\' 4"  (1.626 m), weight 99 kg, SpO2 99 %. PHYSICAL EXAMINATION:  GENERAL:  58 y.o.-year-old patient lying in the bed with no acute distress.  Morbid obesity. EYES: Pupils equal, round, reactive to light and accommodation. No scleral icterus. Extraocular muscles intact.  HEENT: Head atraumatic, normocephalic. Oropharynx and nasopharynx clear.  NECK:  Supple, no jugular venous distention. No thyroid enlargement, no tenderness.  LUNGS: Normal breath sounds bilaterally, no wheezing, rales,rhonchi or crepitation. No use of accessory muscles of respiration.  CARDIOVASCULAR: S1, S2 normal. No murmurs, rubs, or gallops.  ABDOMEN: Soft, non-tender, non-distended. Bowel sounds present. No organomegaly or mass.  EXTREMITIES: No pedal edema, cyanosis, or clubbing.  NEUROLOGIC: Cranial nerves II through XII are intact. Muscle strength 5/5 in all extremities. Sensation intact. Gait not checked.  PSYCHIATRIC: The patient is alert and oriented x 3.  SKIN: No obvious rash, lesion, or ulcer.  DATA REVIEW:   CBC Recent Labs  Lab 02/23/19 1226  WBC 8.1  HGB 11.7*  HCT 38.0  PLT 343    Chemistries  Recent Labs  Lab 02/25/19 0525  NA 137  K 4.4  CL 106  CO2 24  GLUCOSE 257*  BUN 30*  CREATININE 0.82  CALCIUM  8.9  MG 2.2     Microbiology Results  Results for orders placed or performed during the hospital encounter of 02/23/19  SARS Coronavirus 2 (CEPHEID- Performed in [facility] hospital lab), Hosp Order     Status: None   Collection Time: 02/23/19  2:14 PM   Specimen: Nasopharyngeal Swab  Result Value Ref Range Status   SARS Coronavirus 2 NEGATIVE NEGATIVE Final    Comment: (NOTE) If result is NEGATIVE SARS-CoV-2 target nucleic acids are NOT DETECTED. The SARS-CoV-2 RNA is generally detectable in upper and lower  respiratory specimens during the acute phase of infection.  The lowest  concentration of SARS-CoV-2 viral copies this assay can detect is 250  copies / mL. A negative result does not preclude SARS-CoV-2 infection  and should not be used as the sole basis for treatment or other  patient management decisions.  A negative result may occur with  improper specimen collection / handling, submission of specimen other  than nasopharyngeal swab, presence of viral mutation(s) within the  areas targeted by this assay, and inadequate number of viral copies  (<250 copies / mL). A negative result must be combined with clinical  observations, patient history, and epidemiological information. If result is POSITIVE SARS-CoV-2 target nucleic acids are DETECTED. The SARS-CoV-2 RNA is generally detectable in upper and lower  respiratory specimens dur ing the acute phase of infection.  Positive  results are indicative of active infection with SARS-CoV-2.  Clinical  correlation with patient history and other diagnostic information is  necessary to determine patient infection status.  Positive results do  not rule out bacterial infection or co-infection with other viruses. If result is PRESUMPTIVE POSTIVE SARS-CoV-2 nucleic acids MAY BE PRESENT.   A presumptive positive result was obtained on the submitted specimen  and confirmed on repeat testing.  While 2019 novel coronavirus  (SARS-CoV-2) nucleic acids may be present in the submitted sample  additional confirmatory testing may be necessary for epidemiological  and / or clinical management purposes  to differentiate between  SARS-CoV-2 and other Sarbecovirus currently known to infect humans.  If clinically indicated additional testing with an alternate test  methodology [phone]) is advised. The SARS-CoV-2 RNA is generally  detectable in upper and lower respiratory sp ecimens during the acute  phase of infection. The expected result is Negative. Fact Sheet for Patients:  StrictlyIdeas.no  Fact Sheet for Healthcare Providers: BankingDealers.co.za This test is not yet approved or cleared by the [place] FDA and has been authorized for detection and/or diagnosis of SARS-CoV-2 by FDA under an Emergency Use Authorization (EUA).  This EUA will remain in effect (meaning this test can be used) for the duration of the COVID-19 declaration under Section 564(b)(1) of the Act, 21 U.S.C. section 360bbb-3(b)(1), unless the authorization is terminated or revoked sooner. Performed at [facility], [address] [place], [place] [address]     RADIOLOGY:  No results found.   Management plans discussed with the patient, family and they are in agreement.  CODE STATUS: Full Code   TOTAL TIME TAKING CARE OF THIS PATIENT: 26 minutes.    Demetrios Loll M.D on 02/25/2019 at 10:46 AM  Between 7am to 6pm - Pager - [phone]  After 6pm go to [email or web address] - Proofreader  [facility]  Office  [phone]  CC: Primary care physician; [facility]   Note: This dictation was prepared with Dragon dictation along with smaller phrase technology. Any transcriptional errors that result from  this process are unintentional.

## 2019-02-25 NOTE — Discharge Instructions (Signed)
°  HHPTOT

## 2019-02-25 NOTE — Progress Notes (Signed)
Pt to be discharged to home today. Iv and tele removed. disch instructions (no prescrips) given to pt's understanding. disch via w.c. family to transport.

## 2019-02-26 ENCOUNTER — Telehealth: Payer: Self-pay | Admitting: Cardiovascular Disease

## 2019-02-26 NOTE — Telephone Encounter (Signed)
-----   Message from Minna Merritts, MD sent at 02/23/2019  4:30 PM EDT ----- Regarding: er Seen in the ER Friday Needs follow up with TG Thx TG

## 2019-02-26 NOTE — Telephone Encounter (Signed)
Scheduled 7/20 .  Patient offered sooner and declined.

## 2019-02-27 LAB — HIV ANTIBODY (ROUTINE TESTING W REFLEX): HIV Screen 4th Generation wRfx: NONREACTIVE

## 2019-03-02 ENCOUNTER — Telehealth: Payer: Self-pay | Admitting: Cardiovascular Disease

## 2019-03-02 NOTE — Progress Notes (Signed)
Cardiology Office Note  Date:  03/05/2019   ID:  Julie Bender, DOB [birth year], MRN [id number]  PCP:  [facility] [place]   Chief Complaint  Patient presents with  . Other    Hospital follow up. Patient c/o chest tightness and SOB when walking. Meds reviewed verbally with patient.     HPI:  58 year old woman followed at [facility] cardiology  normal ejection fraction, severe LVH,  midcavity obliteration gradient 41 up to 62 mmHg,  pulmonary hypertension,  morbid obesity,  significant mitral valve regurgitation,  obstructive sleep apnea not treated secondary to financial issues  Heart catheterization August 2017 no significant coronary disease Hospital admission 04/18/2018 with shortness of breath Who presents for follow up of her chronic diastolic chf  Redsvest Vest score today 31%  Recent hospitalization July 2020 Shortness of breath and COPD acute respiratory failure with hypoxia --- Triage notes indicated bilateral leg pain for 3 days with no swelling, weakness, dry cough neck pain Treated with IV steroids BNP 458 Treated with Lasix IV twice daily then changed to torsemide 60 daily at discharge  Reports having chronic shortness of breath on exertion, deconditioned Working on her weight Dry weight 215 pounds Concerned that some days she does not make much urine She is 5 foot 4, difficulty losing weight -Long history of chronic chest pain on exertion   hospital 04/18/2018  respiratory distress secondary to morbid obesity deconditioning chronic diastolic CHF obstructive sleep apnea, HOCM Given oxygen, IV Lasix, restarted on verapamil Elevated troponin in the setting of demand ischemia, severe LVH Atypical chest discomfort --Echocardiogram at that time ejection fraction 55 to 60% Moderate to severe LVH with mild LVOT obstruction Mild to moderate MR  Echo 04/2018, results reviewed Left ventricle: The cavity size was normal. There was moderate   concentric hypertrophy, septal  wall appears to be moderate to   severe (apical region appears to be spared) with mild LVOT   obstruction. Systolic function was normal. The estimated ejection   fraction was in the range of 55% to 60%. Wall motion was normal;   there were no regional wall motion abnormalities. Features are   consistent with a pseudonormal left ventricular filling pattern,   with concomitant abnormal relaxation and increased filling   pressure (grade 2 diastolic dysfunction). - Aortic valve: There was trivial regurgitation. - Mitral valve: There was mild to moderate regurgitation. - Left atrium: The atrium was mildly dilated. - Right ventricle: Systolic function was normal. - Pulmonary arteries: Systolic pressure could not be accurately   estimated.    PMH:   has a past medical history of CHF (congestive heart failure) ([place]), COPD (chronic obstructive pulmonary disease) ([place]), Diabetes mellitus without complication ([place]), Diabetic retinopathy ([place]), History of placement of internal cardiac defibrillator, Hypertension, and Hypertrophic cardiomyopathy ([place]).  PSH:    Past Surgical History:  Procedure Laterality Date  . CARDIAC DEFIBRILLATOR PLACEMENT    . CHOLECYSTECTOMY      Current Outpatient Medications on File Prior to Visit  Medication Sig Dispense Refill  . albuterol (PROVENTIL HFA;VENTOLIN HFA) 108 (90 Base) MCG/ACT inhaler Inhale 2 puffs into the lungs every 6 (six) hours as needed for wheezing or shortness of breath. 1 Inhaler 2  . aspirin EC 81 MG tablet Take 81 mg by mouth daily.    Marland Kitchen atorvastatin (LIPITOR) 40 MG tablet Take 1 tablet (40 mg total) by mouth daily at 6 PM. (Patient taking differently: Take 40 mg by mouth daily. ) 90 tablet 0  . carvedilol (COREG)  25 MG tablet TAKE 1 TABLET(25 MG) BY MOUTH TWICE DAILY (Patient taking differently: Take 25 mg by mouth 2 (two) times a day. ) 180 tablet 3  . insulin aspart (NOVOLOG FLEXPEN) 100 UNIT/ML FlexPen Inject 13 Units into the skin 3  (three) times daily with meals.     . insulin detemir (LEVEMIR) 100 UNIT/ML injection Inject 42 Units into the skin at bedtime.    Marland Kitchen losartan (COZAAR) 50 MG tablet Take 1 tablet (50 mg total) by mouth daily. (Patient taking differently: Take 100 mg by mouth daily. ) 90 tablet 3  . spironolactone (ALDACTONE) 25 MG tablet TAKE 1 TABLET(25 MG) BY MOUTH DAILY (Patient taking differently: Take 25 mg by mouth daily. ) 90 tablet 0  . torsemide (DEMADEX) 20 MG tablet TAKE 3 TABLETS(60 MG) BY MOUTH DAILY (Patient taking differently: Take 60 mg by mouth daily. ) 42 tablet 0  . traZODone (DESYREL) 150 MG tablet Take 150 mg by mouth at bedtime.    Marland Kitchen VICTOZA 18 MG/3ML SOPN Inject 0.6 mg into the skin daily.  11  . citalopram (CELEXA) 20 MG tablet Take 20 mg by mouth daily.    . metFORMIN (GLUCOPHAGE) 850 MG tablet Take 850 mg by mouth 3 (three) times daily.     No current facility-administered medications on file prior to visit.     Allergies:   Patient has no known allergies.   Social History:  The patient  reports that she is a non-smoker but has been exposed to tobacco smoke. She has never used smokeless tobacco. She reports that she does not drink alcohol or use drugs.   Family History:   family history includes Hypertrophic cardiomyopathy in her sister.    Review of Systems: Review of Systems  Constitutional: Negative.   HENT: Negative.   Respiratory: Positive for shortness of breath.   Cardiovascular: Negative.   Gastrointestinal: Negative.   Musculoskeletal: Negative.   Neurological: Negative.   Psychiatric/Behavioral: Negative.   All other systems reviewed and are negative.   PHYSICAL EXAM: VS:  BP (!) 90/50   Pulse (!) 55   Ht 5\' 4"  (1.626 m)   Wt 215 lb 8 oz (97.8 kg)   BMI 36.99 kg/m  , BMI Body mass index is 36.99 kg/m.  Blood pressure low bilaterally GEN: Well nourished, well developed, in no acute distress , obese HEENT: normal  Neck: no JVD, carotid bruits, or  masses Cardiac: RRR; no murmurs, rubs, or gallops,no edema  Respiratory:  clear to auscultation bilaterally, normal work of breathing GI: soft, nontender, nondistended, + BS MS: no deformity or atrophy  Skin: warm and dry, no rash Neuro:  Strength and sensation are intact Psych: euthymic mood, full affect   Recent Labs: 04/17/2018: ALT 18 04/19/2018: TSH 0.351 02/23/2019: B Natriuretic Peptide 458.0; Hemoglobin 11.7; Platelets 343 02/25/2019: BUN 30; Creatinine, Ser 0.82; Magnesium 2.2; Potassium 4.4; Sodium 137    Lipid Panel Lab Results  Component Value Date   CHOL 90 09/05/2016   HDL 30 (L) 09/05/2016   LDLCALC 48 09/05/2016   TRIG 60 09/05/2016      Wt Readings from Last 3 Encounters:  03/05/19 215 lb 8 oz (97.8 kg)  02/25/19 218 lb 3.2 oz (99 kg)  05/05/18 216 lb 8 oz (98.2 kg)      ASSESSMENT AND PLAN:  HOCM (hypertrophic obstructive cardiomyopathy) ([place]) - Plan: EKG 12-Lead Stressed importance of staying on her carvedilol and verapamil Torsemide 60 daily, continue current dose, reds vest score  of 31%, weight 215 pounds Given her low blood pressure we will cut verapamil down to 240 daily  Hypertensive heart disease with heart failure (HCC) Medication changes as above, hypotensive with fatigue  Dyspnea, unspecified type - Plan: EKG 12-Lead Currently on torsemide 60 mg in the morning Recommended if she has weight gain 3 pounds or more she may need extra torsemide, call our office  Demand ischemia ([place]) Secondary to severe LVH Normal ejection fraction Prior cardiac catheterization several years ago with no significant coronary disease  Thyroid nodule 3.6 cm noted previously long history of goiter  Chronic diastolic CHF Secondary to LVH, morbid obesity, obstructive sleep apnea   Total encounter time more than 25 minutes  Greater than 50% was spent in counseling and coordination of care with the patient  Disposition:   F/U  6 months   Orders Placed This  Encounter  Procedures  . EKG 12-Lead     Signed, Esmond Plants, M.D., Ph.D. 03/05/2019  [place], [place]

## 2019-03-02 NOTE — Telephone Encounter (Signed)

## 2019-03-05 ENCOUNTER — Ambulatory Visit (INDEPENDENT_AMBULATORY_CARE_PROVIDER_SITE_OTHER): Payer: Medicare Other | Admitting: Cardiovascular Disease

## 2019-03-05 ENCOUNTER — Encounter: Payer: Self-pay | Admitting: Cardiovascular Disease

## 2019-03-05 ENCOUNTER — Other Ambulatory Visit: Payer: Self-pay

## 2019-03-05 VITALS — BP 90/50 | HR 55 | Ht 64.0 in | Wt 215.5 lb

## 2019-03-05 DIAGNOSIS — I110 Hypertensive heart disease with heart failure: Secondary | ICD-10-CM

## 2019-03-05 DIAGNOSIS — I421 Obstructive hypertrophic cardiomyopathy: Secondary | ICD-10-CM | POA: Diagnosis not present

## 2019-03-05 DIAGNOSIS — R0600 Dyspnea, unspecified: Secondary | ICD-10-CM

## 2019-03-05 DIAGNOSIS — G4733 Obstructive sleep apnea (adult) (pediatric): Secondary | ICD-10-CM | POA: Diagnosis not present

## 2019-03-05 MED ORDER — VERAPAMIL HCL ER 240 MG PO TBCR: EXTENDED_RELEASE_TABLET | ORAL | Status: DC

## 2019-03-05 NOTE — Patient Instructions (Addendum)
REDS VEST score 31%, perfect, weight 215 pounds   Medication Instructions:  Your physician has recommended you make the following change in your medication:   1) Decrease verapamil 240 mg- take 1 tablet by mouth once daily  - Keep weight at 215 or less   If you need a refill on your cardiac medications before your next appointment, please call your pharmacy.    Lab work: No new labs needed   If you have labs (blood work) drawn today and your tests are completely normal, you will receive your results only by: Marland Kitchen MyChart Message (if you have MyChart) OR . A paper copy in the mail If you have any lab test that is abnormal or we need to change your treatment, we will call you to review the results.   Testing/Procedures: No new testing needed   Follow-Up: At [facility], you and your health needs are our priority.  As part of our continuing mission to provide you with exceptional heart care, we have created designated Provider Care Teams.  These Care Teams include your primary Cardiologist (physician) and Advanced Practice Providers (APPs -  Physician Assistants and Nurse Practitioners) who all work together to provide you with the care you need, when you need it.  . You will need a follow up appointment in 6 months (Janurary 2021) .   Please call our office 2 months in advance to schedule this appointment.  (Call in early November to schedule).  . Providers on your designated Care Team:   . Murray Hodgkins, NP . Christell Faith, PA-C . Marrianne Mood, PA-C  Any Other Special Instructions Will Be Listed Below (If Applicable).  For educational health videos Log in to : [email or web address] Or : [email or web address], password : [email or web address]

## 2019-04-05 ENCOUNTER — Other Ambulatory Visit: Payer: Self-pay | Admitting: Cardiovascular Disease

## 2019-05-19 ENCOUNTER — Emergency Department: Payer: Medicare Other

## 2019-05-19 ENCOUNTER — Encounter: Payer: Self-pay | Admitting: Emergency Medicine

## 2019-05-19 ENCOUNTER — Other Ambulatory Visit: Payer: Self-pay

## 2019-05-19 ENCOUNTER — Inpatient Hospital Stay
Admission: EM | Admit: 2019-05-19 | Discharge: 2019-05-26 | DRG: 292 | Disposition: A | Discharge status: Home / Self Care | Payer: Medicare Other | Attending: Internal Medicine | Admitting: Internal Medicine

## 2019-05-19 DIAGNOSIS — Z8249 Family history of ischemic heart disease and other diseases of the circulatory system: Secondary | ICD-10-CM

## 2019-05-19 DIAGNOSIS — E11319 Type 2 diabetes mellitus with unspecified diabetic retinopathy without macular edema: Secondary | ICD-10-CM | POA: Diagnosis present

## 2019-05-19 DIAGNOSIS — I110 Hypertensive heart disease with heart failure: Secondary | ICD-10-CM | POA: Diagnosis not present

## 2019-05-19 DIAGNOSIS — I422 Other hypertrophic cardiomyopathy: Secondary | ICD-10-CM | POA: Diagnosis present

## 2019-05-19 DIAGNOSIS — R0602 Shortness of breath: Secondary | ICD-10-CM

## 2019-05-19 DIAGNOSIS — J441 Chronic obstructive pulmonary disease with (acute) exacerbation: Secondary | ICD-10-CM | POA: Diagnosis present

## 2019-05-19 DIAGNOSIS — J984 Other disorders of lung: Secondary | ICD-10-CM

## 2019-05-19 DIAGNOSIS — E1165 Type 2 diabetes mellitus with hyperglycemia: Secondary | ICD-10-CM | POA: Diagnosis not present

## 2019-05-19 DIAGNOSIS — K2970 Gastritis, unspecified, without bleeding: Secondary | ICD-10-CM | POA: Diagnosis present

## 2019-05-19 DIAGNOSIS — G4733 Obstructive sleep apnea (adult) (pediatric): Secondary | ICD-10-CM | POA: Diagnosis present

## 2019-05-19 DIAGNOSIS — I340 Nonrheumatic mitral (valve) insufficiency: Secondary | ICD-10-CM | POA: Diagnosis present

## 2019-05-19 DIAGNOSIS — Z794 Long term (current) use of insulin: Secondary | ICD-10-CM

## 2019-05-19 DIAGNOSIS — I421 Obstructive hypertrophic cardiomyopathy: Secondary | ICD-10-CM | POA: Diagnosis present

## 2019-05-19 DIAGNOSIS — Z7722 Contact with and (suspected) exposure to environmental tobacco smoke (acute) (chronic): Secondary | ICD-10-CM | POA: Diagnosis present

## 2019-05-19 DIAGNOSIS — I2510 Atherosclerotic heart disease of native coronary artery without angina pectoris: Secondary | ICD-10-CM | POA: Diagnosis present

## 2019-05-19 DIAGNOSIS — Z6837 Body mass index (BMI) 37.0-37.9, adult: Secondary | ICD-10-CM

## 2019-05-19 DIAGNOSIS — K219 Gastro-esophageal reflux disease without esophagitis: Secondary | ICD-10-CM | POA: Diagnosis present

## 2019-05-19 DIAGNOSIS — I5033 Acute on chronic diastolic (congestive) heart failure: Secondary | ICD-10-CM | POA: Diagnosis present

## 2019-05-19 DIAGNOSIS — Z95810 Presence of automatic (implantable) cardiac defibrillator: Secondary | ICD-10-CM

## 2019-05-19 DIAGNOSIS — I272 Pulmonary hypertension, unspecified: Secondary | ICD-10-CM | POA: Diagnosis present

## 2019-05-19 DIAGNOSIS — R0789 Other chest pain: Secondary | ICD-10-CM | POA: Diagnosis present

## 2019-05-19 DIAGNOSIS — Z7982 Long term (current) use of aspirin: Secondary | ICD-10-CM

## 2019-05-19 DIAGNOSIS — D649 Anemia, unspecified: Secondary | ICD-10-CM | POA: Diagnosis present

## 2019-05-19 DIAGNOSIS — R079 Chest pain, unspecified: Secondary | ICD-10-CM | POA: Diagnosis present

## 2019-05-19 DIAGNOSIS — Z79899 Other long term (current) drug therapy: Secondary | ICD-10-CM

## 2019-05-19 DIAGNOSIS — E1140 Type 2 diabetes mellitus with diabetic neuropathy, unspecified: Secondary | ICD-10-CM | POA: Diagnosis present

## 2019-05-19 DIAGNOSIS — Z20828 Contact with and (suspected) exposure to other viral communicable diseases: Secondary | ICD-10-CM | POA: Diagnosis present

## 2019-05-19 LAB — BRAIN NATRIURETIC PEPTIDE: B Natriuretic Peptide: 463 pg/mL — ABNORMAL HIGH (ref 0.0–100.0)

## 2019-05-19 LAB — TROPONIN I (HIGH SENSITIVITY)
Troponin I (High Sensitivity): 90 ng/L — ABNORMAL HIGH (ref <18)
Troponin I (High Sensitivity): 82 ng/L — ABNORMAL HIGH (ref <18)

## 2019-05-19 LAB — BASIC METABOLIC PANEL
Anion gap: 10 (ref 5–15)
BUN: 19 mg/dL (ref 6–20)
CO2: 25 mmol/L (ref 22–32)
Calcium: 8.6 mg/dL — ABNORMAL LOW (ref 8.9–10.3)
Chloride: 102 mmol/L (ref 98–111)
Creatinine, Ser: 1.2 mg/dL — ABNORMAL HIGH (ref 0.44–1.00)
GFR calc Af Amer: 57 mL/min — ABNORMAL LOW (ref >60)
GFR calc non Af Amer: 49 mL/min — ABNORMAL LOW (ref >60)
Glucose, Bld: 213 mg/dL — ABNORMAL HIGH (ref 70–99)
Potassium: 4.5 mmol/L (ref 3.5–5.1)
Sodium: 137 mmol/L (ref 135–145)

## 2019-05-19 LAB — CBC
HCT: 31.9 % — ABNORMAL LOW (ref 36.0–46.0)
Hemoglobin: 10.1 g/dL — ABNORMAL LOW (ref 12.0–15.0)
MCH: 26.6 pg (ref 26.0–34.0)
MCHC: 31.7 g/dL (ref 30.0–36.0)
MCV: 84.2 fL (ref 80.0–100.0)
Platelets: 253 10*3/uL (ref 150–400)
RBC: 3.79 MIL/uL — ABNORMAL LOW (ref 3.87–5.11)
RDW: 15.6 % — ABNORMAL HIGH (ref 11.5–15.5)
WBC: 7.2 10*3/uL (ref 4.0–10.5)
nRBC: 0.3 % — ABNORMAL HIGH (ref 0.0–0.2)

## 2019-05-19 LAB — HEMOGLOBIN A1C
Hgb A1c MFr Bld: 7.2 % — ABNORMAL HIGH (ref 4.8–5.6)
Mean Plasma Glucose: 159.94 mg/dL

## 2019-05-19 LAB — SARS CORONAVIRUS 2 BY RT PCR (HOSPITAL ORDER, PERFORMED IN ~~LOC~~ HOSPITAL LAB): SARS Coronavirus 2: NEGATIVE

## 2019-05-19 LAB — GLUCOSE, CAPILLARY
Glucose-Capillary: 126 mg/dL — ABNORMAL HIGH (ref 70–99)
Glucose-Capillary: 197 mg/dL — ABNORMAL HIGH (ref 70–99)

## 2019-05-19 MED ORDER — ENOXAPARIN SODIUM 40 MG/0.4ML ~~LOC~~ SOLN
40.0000 mg | SUBCUTANEOUS | Status: DC
  Administered 2019-05-19 – 2019-05-25 (×7): 40 mg via SUBCUTANEOUS
  Filled 2019-05-19 (×7): qty 0.4

## 2019-05-19 MED ORDER — TRAMADOL HCL 50 MG PO TABS
50.0000 mg | ORAL_TABLET | Freq: Four times a day (QID) | ORAL | Status: DC | PRN
  Administered 2019-05-19 – 2019-05-20 (×2): 50 mg via ORAL
  Filled 2019-05-19 (×2): qty 1

## 2019-05-19 MED ORDER — ACETAMINOPHEN 325 MG PO TABS
650.0000 mg | ORAL_TABLET | Freq: Four times a day (QID) | ORAL | Status: DC | PRN
  Administered 2019-05-20: 650 mg via ORAL
  Filled 2019-05-19: qty 2

## 2019-05-19 MED ORDER — INSULIN DETEMIR 100 UNIT/ML ~~LOC~~ SOLN
42.0000 [IU] | Freq: Every day | SUBCUTANEOUS | Status: DC
  Administered 2019-05-19 – 2019-05-23 (×5): 42 [IU] via SUBCUTANEOUS
  Filled 2019-05-19 (×6): qty 0.42

## 2019-05-19 MED ORDER — ACETAMINOPHEN 650 MG RE SUPP: 650.0000 mg | Freq: Four times a day (QID) | RECTAL | Status: DC | PRN

## 2019-05-19 MED ORDER — IOHEXOL 350 MG/ML SOLN
100.0000 mL | Freq: Once | INTRAVENOUS | Status: AC | PRN
  Administered 2019-05-19: 100 mL via INTRAVENOUS

## 2019-05-19 MED ORDER — ONDANSETRON HCL 4 MG/2ML IJ SOLN: 4.0000 mg | Freq: Four times a day (QID) | INTRAMUSCULAR | Status: DC | PRN

## 2019-05-19 MED ORDER — ASPIRIN EC 81 MG PO TBEC
81.0000 mg | DELAYED_RELEASE_TABLET | Freq: Every day | ORAL | Status: DC
  Administered 2019-05-20 – 2019-05-26 (×7): 81 mg via ORAL
  Filled 2019-05-19 (×7): qty 1

## 2019-05-19 MED ORDER — NITROGLYCERIN 0.4 MG SL SUBL: 0.4000 mg | SUBLINGUAL_TABLET | SUBLINGUAL | Status: DC | PRN

## 2019-05-19 MED ORDER — OXYCODONE-ACETAMINOPHEN 5-325 MG PO TABS
1.0000 | ORAL_TABLET | Freq: Four times a day (QID) | ORAL | Status: DC | PRN
  Administered 2019-05-19 – 2019-05-25 (×10): 1 via ORAL
  Filled 2019-05-19 (×10): qty 1

## 2019-05-19 MED ORDER — ALBUTEROL SULFATE (2.5 MG/3ML) 0.083% IN NEBU: 2.5000 mg | INHALATION_SOLUTION | RESPIRATORY_TRACT | Status: DC | PRN

## 2019-05-19 MED ORDER — GABAPENTIN 300 MG PO CAPS
300.0000 mg | ORAL_CAPSULE | Freq: Two times a day (BID) | ORAL | Status: DC
  Administered 2019-05-19 – 2019-05-26 (×14): 300 mg via ORAL
  Filled 2019-05-19 (×14): qty 1

## 2019-05-19 MED ORDER — TRAZODONE HCL 50 MG PO TABS
150.0000 mg | ORAL_TABLET | Freq: Every day | ORAL | Status: DC
  Administered 2019-05-19 – 2019-05-25 (×7): 150 mg via ORAL
  Filled 2019-05-19 (×7): qty 1

## 2019-05-19 MED ORDER — INSULIN ASPART 100 UNIT/ML ~~LOC~~ SOLN
7.0000 [IU] | Freq: Three times a day (TID) | SUBCUTANEOUS | Status: DC
  Administered 2019-05-19 – 2019-05-24 (×12): 7 [IU] via SUBCUTANEOUS
  Filled 2019-05-19 (×12): qty 1

## 2019-05-19 MED ORDER — VERAPAMIL HCL ER 120 MG PO TBCR
120.0000 mg | EXTENDED_RELEASE_TABLET | Freq: Every day | ORAL | Status: DC
  Administered 2019-05-20 – 2019-05-21 (×2): 120 mg via ORAL
  Filled 2019-05-19 (×3): qty 1

## 2019-05-19 MED ORDER — ATORVASTATIN CALCIUM 20 MG PO TABS
40.0000 mg | ORAL_TABLET | Freq: Every day | ORAL | Status: DC
  Administered 2019-05-19 – 2019-05-25 (×7): 40 mg via ORAL
  Filled 2019-05-19 (×7): qty 2

## 2019-05-19 MED ORDER — ONDANSETRON HCL 4 MG PO TABS: 4.0000 mg | ORAL_TABLET | Freq: Four times a day (QID) | ORAL | Status: DC | PRN

## 2019-05-19 MED ORDER — INSULIN ASPART 100 UNIT/ML ~~LOC~~ SOLN
0.0000 [IU] | Freq: Three times a day (TID) | SUBCUTANEOUS | Status: DC
  Administered 2019-05-19: 5 [IU] via SUBCUTANEOUS
  Administered 2019-05-19: 18:00:00 1 [IU] via SUBCUTANEOUS
  Administered 2019-05-20 (×2): 3 [IU] via SUBCUTANEOUS
  Administered 2019-05-21 (×2): 5 [IU] via SUBCUTANEOUS
  Administered 2019-05-22: 2 [IU] via SUBCUTANEOUS
  Administered 2019-05-22: 11 [IU] via SUBCUTANEOUS
  Administered 2019-05-22: 12:00:00 8 [IU] via SUBCUTANEOUS
  Administered 2019-05-23: 2 [IU] via SUBCUTANEOUS
  Administered 2019-05-23: 5 [IU] via SUBCUTANEOUS
  Administered 2019-05-23: 3 [IU] via SUBCUTANEOUS
  Administered 2019-05-24: 11 [IU] via SUBCUTANEOUS
  Administered 2019-05-24: 15 [IU] via SUBCUTANEOUS
  Administered 2019-05-25: 3 [IU] via SUBCUTANEOUS
  Administered 2019-05-25 (×2): 8 [IU] via SUBCUTANEOUS
  Administered 2019-05-26: 11 [IU] via SUBCUTANEOUS
  Filled 2019-05-19 (×18): qty 1

## 2019-05-19 MED ORDER — PANTOPRAZOLE SODIUM 40 MG PO TBEC
40.0000 mg | DELAYED_RELEASE_TABLET | Freq: Every day | ORAL | Status: DC
  Administered 2019-05-19 – 2019-05-22 (×4): 40 mg via ORAL
  Filled 2019-05-19 (×4): qty 1

## 2019-05-19 MED ORDER — SODIUM CHLORIDE 0.9% FLUSH
3.0000 mL | Freq: Once | INTRAVENOUS | Status: AC
  Administered 2019-05-19: 3 mL via INTRAVENOUS

## 2019-05-19 MED ORDER — SPIRONOLACTONE 25 MG PO TABS
25.0000 mg | ORAL_TABLET | Freq: Every day | ORAL | Status: DC
  Administered 2019-05-19: 25 mg via ORAL
  Filled 2019-05-19 (×2): qty 1

## 2019-05-19 MED ORDER — TORSEMIDE 20 MG PO TABS
60.0000 mg | ORAL_TABLET | Freq: Every day | ORAL | Status: DC
  Administered 2019-05-19 – 2019-05-20 (×2): 60 mg via ORAL
  Filled 2019-05-19 (×3): qty 3

## 2019-05-19 MED ORDER — ASPIRIN 81 MG PO CHEW
324.0000 mg | CHEWABLE_TABLET | Freq: Once | ORAL | Status: AC
  Administered 2019-05-19: 12:00:00 324 mg via ORAL
  Filled 2019-05-19: qty 4

## 2019-05-19 MED ORDER — POLYETHYLENE GLYCOL 3350 17 G PO PACK
17.0000 g | PACK | Freq: Every day | ORAL | Status: DC | PRN
  Administered 2019-05-20 – 2019-05-25 (×3): 17 g via ORAL
  Filled 2019-05-19 (×3): qty 1

## 2019-05-19 MED ORDER — CARVEDILOL 25 MG PO TABS
25.0000 mg | ORAL_TABLET | Freq: Two times a day (BID) | ORAL | Status: DC
  Administered 2019-05-19 – 2019-05-20 (×2): 25 mg via ORAL
  Filled 2019-05-19 (×2): qty 1

## 2019-05-19 MED ORDER — INSULIN ASPART 100 UNIT/ML ~~LOC~~ SOLN
0.0000 [IU] | Freq: Every day | SUBCUTANEOUS | Status: DC
  Administered 2019-05-20: 2 [IU] via SUBCUTANEOUS
  Administered 2019-05-23: 3 [IU] via SUBCUTANEOUS
  Filled 2019-05-19 (×2): qty 1

## 2019-05-19 MED ORDER — SODIUM CHLORIDE 0.9% FLUSH
3.0000 mL | Freq: Two times a day (BID) | INTRAVENOUS | Status: DC
  Administered 2019-05-19 – 2019-05-26 (×15): 3 mL via INTRAVENOUS

## 2019-05-19 MED ORDER — LOSARTAN POTASSIUM 50 MG PO TABS
100.0000 mg | ORAL_TABLET | Freq: Every day | ORAL | Status: DC
  Administered 2019-05-19: 100 mg via ORAL
  Filled 2019-05-19 (×2): qty 2

## 2019-05-19 MED ORDER — TRAMADOL HCL 50 MG PO TABS: 50.0000 mg | ORAL_TABLET | Freq: Four times a day (QID) | ORAL | Status: DC | PRN

## 2019-05-19 NOTE — ED Triage Notes (Signed)
Shortness of breath and L chest pain x 3 days. Sob particularly with exertion.

## 2019-05-19 NOTE — ED Notes (Signed)
Pt states she has been having sharp chest pain since Friday- feels like she's being "zapped"- states she has an implanted defibrillator

## 2019-05-19 NOTE — Progress Notes (Signed)
Advance care planning  Purpose of Encounter Chest pain, COPD  Parties in Attendance Patient  Patients Decisional capacity Alert and oriented.  Able to make medical decisions.  No documented healthcare power of attorney.  She wants all her family to make decisions together if she is unable to.  Discussed in detail regarding chest pain, COPD.  Treatment plan , prognosis discussed.  All questions answered  CODE STATUS discussed.  Patient requesting aggressive medical care with CPR/defibrillation/intubation if necessary.  Orders entered and CODE STATUS changed  FULL CODE  Time spent - 17 minutes

## 2019-05-19 NOTE — ED Provider Notes (Signed)
[facility] Emergency Department Provider Note  ____________________________________________   First MD Initiated Contact with Patient 05/19/19 [phone]     (approximate)  I have reviewed the triage vital signs and the nursing notes.   HISTORY  Chief Complaint Shortness of Breath and Chest Pain    HPI Julie Bender is a 58 y.o. female with CHF, COPD, diabetes, defibrillator, hypertrophic cardiomyopathy who presented with shortness of breath with exertion.  Patient endorses having worsening shortness of breath over the past few days.  She has been taking her torsemide 60 mg without improvement.  She thinks she has been on a little bit of additional weight.  She endorses some intermittent chest pain that is a stabbing sensation to her chest that is worse when she takes a deep breath.  She also is having generalized abdominal pain that started the past few days as well.  Denies any vomiting, fevers.          Past Medical History:  Diagnosis Date  . CHF (congestive heart failure) ([place])   . COPD (chronic obstructive pulmonary disease) ([place])   . Diabetes mellitus without complication ([place])   . Diabetic retinopathy ([place])   . History of placement of internal cardiac defibrillator   . Hypertension   . Hypertrophic cardiomyopathy [facility])     Patient Active Problem List   Diagnosis Date Noted  . Acute respiratory failure with hypoxia ([place]) 02/23/2019  . Obesity 05/10/2018  . Dyspnea 05/05/2018  . Thyroid nodule 05/05/2018  . OSA (obstructive sleep apnea) 04/30/2018  . Hypertensive heart disease with heart failure ([place]) 04/30/2018  . Acute respiratory distress 04/18/2018  . Chronic right shoulder pain 02/27/2018  . Elevated troponin 05/10/2017  . Bronchitis 09/06/2016  . Demand ischemia ([place]) 09/06/2016  . HOCM (hypertrophic obstructive cardiomyopathy) ([place]) 09/06/2016  . Routine history and physical examination of adult 08/24/2016  . Volume overload  04/20/2016  . Cardiac defibrillator in place 01/30/2014  . Depression 01/30/2014  . Insomnia 11/29/2012  . Essential hypertension 01/20/2011  . Diabetes mellitus ([place]) 10/31/2008    Past Surgical History:  Procedure Laterality Date  . CARDIAC DEFIBRILLATOR PLACEMENT    . CHOLECYSTECTOMY      Prior to Admission medications   Medication Sig Start Date End Date Taking? Authorizing Provider  albuterol (PROVENTIL HFA;VENTOLIN HFA) 108 (90 Base) MCG/ACT inhaler Inhale 2 puffs into the lungs every 6 (six) hours as needed for wheezing or shortness of breath. 04/20/18   Salary, Holly Bodily D, MD  aspirin EC 81 MG tablet Take 81 mg by mouth daily.    [provider]  atorvastatin (LIPITOR) 40 MG tablet Take 1 tablet (40 mg total) by mouth daily at 6 PM. Patient taking differently: Take 40 mg by mouth daily.  04/20/18   Salary, Avel Peace, MD  carvedilol (COREG) 25 MG tablet TAKE 1 TABLET(25 MG) BY MOUTH TWICE DAILY Patient taking differently: Take 25 mg by mouth 2 (two) times a day.  07/27/18   Minna Merritts, MD  citalopram (CELEXA) 20 MG tablet Take 20 mg by mouth daily. 05/03/16 02/23/19  [provider]  insulin aspart (NOVOLOG FLEXPEN) 100 UNIT/ML FlexPen Inject 13 Units into the skin 3 (three) times daily with meals.     [provider]  insulin detemir (LEVEMIR) 100 UNIT/ML injection Inject 42 Units into the skin at bedtime.    [provider]  losartan (COZAAR) 50 MG tablet Take 1 tablet (50 mg total) by mouth daily. Patient taking differently:  Take 100 mg by mouth daily.  05/05/18   Minna Merritts, MD  metFORMIN (GLUCOPHAGE) 850 MG tablet Take 850 mg by mouth 3 (three) times daily. 05/03/16 02/23/19  [provider]  spironolactone (ALDACTONE) 25 MG tablet TAKE 1 TABLET(25 MG) BY MOUTH DAILY 04/05/19   Minna Merritts, MD  torsemide (DEMADEX) 20 MG tablet TAKE 3 TABLETS(60 MG) BY MOUTH DAILY Patient taking differently: Take 60 mg by mouth daily.   01/02/19   Minna Merritts, MD  traZODone (DESYREL) 150 MG tablet Take 150 mg by mouth at bedtime. 04/02/16   [provider]  verapamil (CALAN-SR) 240 MG CR tablet Take 1 tablet (240 mg) by mouth once daily 03/05/19   Minna Merritts, MD  VICTOZA 18 MG/3ML SOPN Inject 0.6 mg into the skin daily. 02/27/18   [provider]    Allergies Patient has no known allergies.  Family History  Problem Relation Age of Onset  . Hypertrophic cardiomyopathy Sister     Social History Social History   Tobacco Use  . Smoking status: Passive Smoke Exposure - Never Smoker  . Smokeless tobacco: Never Used  Substance Use Topics  . Alcohol use: No  . Drug use: Never      Review of Systems Constitutional: No fever/chills Eyes: No visual changes. ENT: No sore throat. Cardiovascular: Positive chest pain Respiratory: Positive for SOB Gastrointestinal: No abdominal pain.  No nausea, no vomiting.  No diarrhea.  No constipation. Genitourinary: Negative for dysuria. Musculoskeletal: Negative for back pain. Skin: Negative for rash. Neurological: Negative for headaches, focal weakness or numbness. All other ROS negative ____________________________________________   PHYSICAL EXAM:  VITAL SIGNS: ED Triage Vitals [05/19/19 0833]  Enc Vitals Group     BP 131/68     Pulse Rate 68     Resp 20     Temp 98.4 F (36.9 C)     Temp Source Oral     SpO2 93 %     Weight 214 lb (97.1 kg)     Height 5\' 4"  (1.626 m)     Head Circumference      Peak Flow      Pain Score 0     Pain Loc      Pain Edu?      Excl. in [place]?     Constitutional: Alert and oriented. Well appearing and in no acute distress. Eyes: Conjunctivae are normal. EOMI. Head: Atraumatic. Nose: No congestion/rhinnorhea. Mouth/Throat: Mucous membranes are moist.   Neck: No stridor. Trachea Midline. FROM Cardiovascular: Normal rate, regular rhythm. Grossly normal heart sounds.  Good peripheral circulation.  Respiratory: No wheezing, no increased work of breathing Gastrointestinal: Generalized tenderness without evidence of peritonitis.  No distention. No abdominal bruits.  Musculoskeletal: No lower extremity tenderness nor edema.  No joint effusions. Neurologic:  Normal speech and language. No gross focal neurologic deficits are appreciated.  Skin:  Skin is warm, dry and intact. No rash noted. Psychiatric: Mood and affect are normal. Speech and behavior are normal. GU: Deferred   ____________________________________________   LABS (all labs ordered are listed, but only abnormal results are displayed)  Labs Reviewed  BASIC METABOLIC PANEL - Abnormal; Notable for the following components:      Result Value   Glucose, Bld 213 (*)    Creatinine, Ser 1.20 (*)    Calcium 8.6 (*)    GFR calc non Af Amer 49 (*)    GFR calc Af Amer 57 (*)    All  other components within normal limits  CBC - Abnormal; Notable for the following components:   RBC 3.79 (*)    Hemoglobin 10.1 (*)    HCT 31.9 (*)    RDW 15.6 (*)    nRBC 0.3 (*)    All other components within normal limits  BRAIN NATRIURETIC PEPTIDE - Abnormal; Notable for the following components:   B Natriuretic Peptide 463.0 (*)    All other components within normal limits  TROPONIN I (HIGH SENSITIVITY) - Abnormal; Notable for the following components:   Troponin I (High Sensitivity) 90 (*)    All other components within normal limits  SARS CORONAVIRUS 2 (HOSPITAL ORDER, [facility])  TROPONIN I (HIGH SENSITIVITY)   ____________________________________________   ED ECG REPORT I, Vanessa [place], the attending physician, personally viewed and interpreted this ECG.  EKG is normal sinus rate of 64, no ST elevation, no T wave inversion, normal intervals ____________________________________________  RADIOLOGY Robert Bellow, personally viewed and evaluated these images (plain radiographs) as part of my medical  decision making, as well as reviewing the written report by the radiologist.  ED MD interpretation: Chest x-ray with cardiomegaly  Official radiology report(s): Dg Chest 2 View  Result Date: 05/19/2019 CLINICAL DATA:  Bilateral lower extremity pain. Shortness of breath. EXAM: CHEST - 2 VIEW COMPARISON:  02/23/2019 FINDINGS: There is mild bilateral interstitial thickening. There is no focal consolidation. There is no pleural effusion or pneumothorax. There is stable cardiomegaly. There is a dual lead cardiac pacemaker. There is no acute osseous abnormality. IMPRESSION: Cardiomegaly with mild pulmonary vascular congestion. Electronically Signed   By: Kathreen Devoid   On: 05/19/2019 09:22    ____________________________________________   PROCEDURES  Procedure(s) performed (including Critical Care):  Procedures   ____________________________________________   INITIAL IMPRESSION / ASSESSMENT AND PLAN / ED COURSE   Jamela Janz was evaluated in Emergency Department on 05/19/2019 for the symptoms described in the history of present illness. She was evaluated in the context of the global COVID-19 pandemic, which necessitated consideration that the patient might be at risk for infection with the SARS-CoV-2 virus that causes COVID-19. Institutional protocols and algorithms that pertain to the evaluation of patients at risk for COVID-19 are in a state of rapid change based on information released by regulatory bodies including the CDC and federal and state organizations. These policies and algorithms were followed during the patient's care in the ED.     Pt presents with SOB. Differential includes: PNA-will get xray to evaluation Anemia-CBC to evaluate ACS- will get trops Arrhythmia-Will get EKG and keep on monitor.  COVID- will get testing per algorithm. PE-given pleuritic chest pain and no obvious evidence of CHF on examination will get CT PE to rule out pulmonary embolism.  Patient also has  abdominal pain that is generalized no signs of peritonitis but will get CT abdomen to rule into acute intracranial process.   Troponin is 90 which is higher than baseline.  BNP was 463 which is around baseline 2 months ago.  Chest x-ray with cardiomegaly with mild pulmonary vascular congestion but no overt edema.  Kidney function is elevated from baseline at 1.2 with baseline of 0.8.  CT scans are negative for acute processes.  Patient does have a dilated main pulmonary artery.   Given her elevated troponin with h/o HOCM will discuss with the hospital team for admission.    ____________________________________________   FINAL CLINICAL IMPRESSION(S) / ED DIAGNOSES   Final diagnoses:  Chest pain, unspecified type  SOB (shortness of breath)     MEDICATIONS GIVEN DURING THIS VISIT:  Medications  sodium chloride flush (NS) 0.9 % injection 3 mL (has no administration in time range)  iohexol (OMNIPAQUE) 350 MG/ML injection 100 mL (100 mLs Intravenous Contrast Given 05/19/19 1035)  aspirin chewable tablet 324 mg (324 mg Oral Given 05/19/19 1147)     ED Discharge Orders    None       Note:  This document was prepared using Dragon voice recognition software and may include unintentional dictation errors.   Vanessa Waldo, MD 05/19/19 1154

## 2019-05-19 NOTE — H&P (Signed)
[facility] [place] NAME: Julie Bender    MR#:  [id number]  DATE OF BIRTH:  1960/10/28  DATE OF ADMISSION:  05/19/2019  PRIMARY CARE PHYSICIAN: [facility]   REQUESTING/REFERRING PHYSICIAN: Dr. Jari Pigg  CHIEF COMPLAINT:   Chief Complaint  Patient presents with  . Shortness of Breath  . Chest Pain    HISTORY OF PRESENT ILLNESS:  Julie Bender  is a 58 y.o. female with a known history of COPD, hypertension, diabetes mellitus, hypertrophic cardiomyopathy, diastolic CHF presents to the emergency room complaining of 3 days of intermittent shortness of breath and chest pain.  Chest pain is diffuse all over the chest lasts a few minutes.  No aggravating relieving factors.  She does have baseline shortness of breath and has been feeling more short of breath with exertion.  Troponin found to be 90.  In the past troponin was significantly elevated with COPD exacerbations.  She also complains of bilateral lower extremity pain and tingling and numbness in hands and feet which is chronic.  Pain in epigastric area after eating. Here EKG showed nothing acute.  Patient is being admitted for chest pain rule out. She had a catheterization 5 years back and she does not remember the results.  No recent stress test. Follows with [facility] cardiology and [facility] pulmonary. COVID-19 test is negative  PAST MEDICAL HISTORY:   Past Medical History:  Diagnosis Date  . CHF (congestive heart failure) ([place])   . COPD (chronic obstructive pulmonary disease) ([place])   . Diabetes mellitus without complication ([place])   . Diabetic retinopathy ([place])   . History of placement of internal cardiac defibrillator   . Hypertension   . Hypertrophic cardiomyopathy ([place])     PAST SURGICAL HISTORY:   Past Surgical History:  Procedure Laterality Date  . CARDIAC DEFIBRILLATOR PLACEMENT    . CHOLECYSTECTOMY      SOCIAL HISTORY:   Social  History   Tobacco Use  . Smoking status: Passive Smoke Exposure - Never Smoker  . Smokeless tobacco: Never Used  Substance Use Topics  . Alcohol use: No    FAMILY HISTORY:   Family History  Problem Relation Age of Onset  . Hypertrophic cardiomyopathy Sister     DRUG ALLERGIES:  No Known Allergies  REVIEW OF SYSTEMS:   Review of Systems  Constitutional: Positive for malaise/fatigue. Negative for chills, fever and weight loss.  HENT: Negative for hearing loss, nosebleeds and sore throat.   Eyes: Negative for blurred vision, double vision and pain.  Respiratory: Positive for shortness of breath. Negative for cough, hemoptysis, sputum production and wheezing.   Cardiovascular: Positive for chest pain. Negative for palpitations, orthopnea and leg swelling.  Gastrointestinal: Negative for abdominal pain, constipation, diarrhea, heartburn, nausea and vomiting.  Genitourinary: Negative for dysuria and hematuria.  Musculoskeletal: Positive for myalgias. Negative for back pain, falls and joint pain.  Skin: Negative for rash.  Neurological: Negative for dizziness, tremors, sensory change, speech change, focal weakness, seizures and headaches.  Endo/Heme/Allergies: Does not bruise/bleed easily.  Psychiatric/Behavioral: Negative for depression and memory loss. The patient is not nervous/anxious.     MEDICATIONS AT HOME:   Prior to Admission medications   Medication Sig Start Date End Date Taking? Authorizing Provider  albuterol (PROVENTIL HFA;VENTOLIN HFA) 108 (90 Base) MCG/ACT inhaler Inhale 2 puffs into the lungs every 6 (six) hours as needed for wheezing or shortness of breath. 04/20/18   Salary, Avel Peace, MD  aspirin EC 81 MG tablet Take 81 mg by mouth daily.    [provider]  atorvastatin (LIPITOR) 40 MG tablet Take 1 tablet (40 mg total) by mouth daily at 6 PM. Patient taking differently: Take 40 mg by mouth daily.  04/20/18   Salary, Avel Peace, MD  carvedilol (COREG) 25  MG tablet TAKE 1 TABLET(25 MG) BY MOUTH TWICE DAILY Patient taking differently: Take 25 mg by mouth 2 (two) times a day.  07/27/18   Minna Merritts, MD  citalopram (CELEXA) 20 MG tablet Take 20 mg by mouth daily. 05/03/16 02/23/19  [provider]  insulin aspart (NOVOLOG FLEXPEN) 100 UNIT/ML FlexPen Inject 13 Units into the skin 3 (three) times daily with meals.     [provider]  insulin detemir (LEVEMIR) 100 UNIT/ML injection Inject 42 Units into the skin at bedtime.    [provider]  losartan (COZAAR) 50 MG tablet Take 1 tablet (50 mg total) by mouth daily. Patient taking differently: Take 100 mg by mouth daily.  05/05/18   Minna Merritts, MD  metFORMIN (GLUCOPHAGE) 850 MG tablet Take 850 mg by mouth 3 (three) times daily. 05/03/16 02/23/19  [provider]  spironolactone (ALDACTONE) 25 MG tablet TAKE 1 TABLET(25 MG) BY MOUTH DAILY 04/05/19   Minna Merritts, MD  torsemide (DEMADEX) 20 MG tablet TAKE 3 TABLETS(60 MG) BY MOUTH DAILY Patient taking differently: Take 60 mg by mouth daily.  01/02/19   Minna Merritts, MD  traZODone (DESYREL) 150 MG tablet Take 150 mg by mouth at bedtime. 04/02/16   [provider]  verapamil (CALAN-SR) 240 MG CR tablet Take 1 tablet (240 mg) by mouth once daily 03/05/19   Minna Merritts, MD  VICTOZA 18 MG/3ML SOPN Inject 0.6 mg into the skin daily. 02/27/18   [provider]     VITAL SIGNS:  Blood pressure (!) 147/74, pulse 72, temperature 98.4 F (36.9 C), temperature source Oral, resp. rate 18, height 5\' 4"  (1.626 m), weight 97.1 kg, SpO2 96 %.  PHYSICAL EXAMINATION:  Physical Exam  GENERAL:  58 y.o.-year-old patient lying in the bed with no acute distress.  Obese EYES: Pupils equal, round, reactive to light and accommodation. No scleral icterus. Extraocular muscles intact.  HEENT: Head atraumatic, normocephalic. Oropharynx and nasopharynx clear. No oropharyngeal erythema, moist oral mucosa   NECK:  Supple, no jugular venous distention. No thyroid enlargement, no tenderness.  LUNGS: Normal breath sounds bilaterally, no wheezing, rales, rhonchi. No use of accessory muscles of respiration.  CARDIOVASCULAR: S1, S2 normal. No murmurs, rubs, or gallops.  ABDOMEN: Soft, nontender, nondistended. Bowel sounds present. No organomegaly or mass.  EXTREMITIES: No pedal edema, cyanosis, or clubbing. + 2 pedal & radial pulses b/l.   NEUROLOGIC: Cranial nerves II through XII are intact. No focal Motor or sensory deficits appreciated b/l PSYCHIATRIC: The patient is alert and oriented x 3. Good affect.  SKIN: No obvious rash, lesion, or ulcer.   LABORATORY PANEL:   CBC Recent Labs  Lab 05/19/19 0835  WBC 7.2  HGB 10.1*  HCT 31.9*  PLT 253   ------------------------------------------------------------------------------------------------------------------  Chemistries  Recent Labs  Lab 05/19/19 0835  NA 137  K 4.5  CL 102  CO2 25  GLUCOSE 213*  BUN 19  CREATININE 1.20*  CALCIUM 8.6*   ------------------------------------------------------------------------------------------------------------------  Cardiac Enzymes No results for input(s): TROPONINI in the last 168 hours. ------------------------------------------------------------------------------------------------------------------  RADIOLOGY:  Dg Chest 2 View  Result Date: 05/19/2019 CLINICAL DATA:  Bilateral lower extremity pain. Shortness of breath. EXAM: CHEST - 2 VIEW COMPARISON:  02/23/2019 FINDINGS: There is mild bilateral interstitial thickening. There is no focal consolidation. There is no pleural effusion or pneumothorax. There is stable cardiomegaly. There is a dual lead cardiac pacemaker. There is no acute osseous abnormality. IMPRESSION: Cardiomegaly with mild pulmonary vascular congestion. Electronically Signed   By: Kathreen Devoid   On: 05/19/2019 09:22   Ct Angio Chest Pe W And/or Wo Contrast  Result Date:  05/19/2019 CLINICAL DATA:  Bilateral lower extremity pain and shortness of breath. EXAM: CT ANGIOGRAPHY CHEST CT ABDOMEN AND PELVIS WITH CONTRAST TECHNIQUE: Multidetector CT imaging of the chest was performed using the standard protocol during bolus administration of intravenous contrast. Multiplanar CT image reconstructions and MIPs were obtained to evaluate the vascular anatomy. Multidetector CT imaging of the abdomen and pelvis was performed using the standard protocol during bolus administration of intravenous contrast. CONTRAST:  1105mL OMNIPAQUE IOHEXOL 350 MG/ML SOLN COMPARISON:  CT dated 04/18/2018 FINDINGS: CTA CHEST FINDINGS Cardiovascular: Contrast injection is sufficient to demonstrate satisfactory opacification of the pulmonary arteries to the segmental level. There is no pulmonary embolus. The main pulmonary artery is dilated measuring approximately 3.8 cm in diameter. There is no CT evidence of acute right heart strain. There are mild atherosclerotic changes of the visualized thoracic aorta. Heart size is enlarged. There is significant wall thickening of the left ventricle. 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 There is no significant pericardial effusion. Mediastinum/Nodes: --No mediastinal or hilar lymphadenopathy. --No axillary lymphadenopathy. --No supraclavicular lymphadenopathy. --a large left-sided thyroid nodule is again noted. --The esophagus is unremarkable Lungs/Pleura: The lung volumes are low. The trachea is unremarkable. There is a trace right-sided pleural effusion. There is no pneumothorax or focal infiltrate. There is a small amount of atelectasis in the lingula. Musculoskeletal: No chest wall abnormality. No acute or significant osseous findings. Review of the MIP images confirms the above findings. CT ABDOMEN and PELVIS FINDINGS Hepatobiliary: The liver is normal. Status post cholecystectomy.There is no biliary ductal dilation. Pancreas: Normal contours without ductal dilatation. No  peripancreatic fluid collection. Spleen: No splenic laceration or hematoma. Adrenals/Urinary Tract: --Adrenal glands: No adrenal hemorrhage. --Right kidney/ureter: No hydronephrosis or perinephric hematoma. --Left kidney/ureter: No hydronephrosis or perinephric hematoma. --Urinary bladder: Unremarkable. Stomach/Bowel: --Stomach/Duodenum: No hiatal hernia or other gastric abnormality. Normal duodenal course and caliber. --Small bowel: No dilatation or inflammation. --Colon: No focal abnormality. --Appendix: Normal. Vascular/Lymphatic: Atherosclerotic calcification is present within the non-aneurysmal abdominal aorta, without hemodynamically significant stenosis. --No retroperitoneal lymphadenopathy. --No mesenteric lymphadenopathy. --No pelvic or inguinal lymphadenopathy. Reproductive: Unremarkable Other: There is a partially visualized 1.7 x 1.2 cm subcutaneous nodule in the left gluteal fold. The abdominal wall is normal. Musculoskeletal. No acute displaced fractures. Review of the MIP images confirms the above findings. IMPRESSION: 1. No acute thoracic, abdominal or pelvic injury. 2. Cardiomegaly with significant wall thickening of the left ventricle. 3. Trace right-sided pleural effusion. 4. Dilated main pulmonary artery which can be seen in patients with elevated pulmonary artery pressures. 5. Partially visualized 1.7 x 1.2 cm subcutaneous nodule in the left gluteal fold. Recommend correlation with physical examination. This could represent a sebaceous cyst. Aortic Atherosclerosis (ICD10-I70.0). Electronically Signed   By: Constance Holster M.D.   On: 05/19/2019 11:06   Ct Abdomen Pelvis W Contrast  Result Date: 05/19/2019 CLINICAL DATA:  Bilateral lower extremity pain and shortness of breath. EXAM: CT ANGIOGRAPHY CHEST CT ABDOMEN AND PELVIS WITH CONTRAST TECHNIQUE: Multidetector CT imaging  of the chest was performed using the standard protocol during bolus administration of intravenous contrast.  Multiplanar CT image reconstructions and MIPs were obtained to evaluate the vascular anatomy. Multidetector CT imaging of the abdomen and pelvis was performed using the standard protocol during bolus administration of intravenous contrast. CONTRAST:  154mL OMNIPAQUE IOHEXOL 350 MG/ML SOLN COMPARISON:  CT dated 04/18/2018 FINDINGS: CTA CHEST FINDINGS Cardiovascular: Contrast injection is sufficient to demonstrate satisfactory opacification of the pulmonary arteries to the segmental level. There is no pulmonary embolus. The main pulmonary artery is dilated measuring approximately 3.8 cm in diameter. There is no CT evidence of acute right heart strain. There are mild atherosclerotic changes of the visualized thoracic aorta. Heart size is enlarged. There is significant wall thickening of the left ventricle. 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 There is no significant pericardial effusion. Mediastinum/Nodes: --No mediastinal or hilar lymphadenopathy. --No axillary lymphadenopathy. --No supraclavicular lymphadenopathy. --a large left-sided thyroid nodule is again noted. --The esophagus is unremarkable Lungs/Pleura: The lung volumes are low. The trachea is unremarkable. There is a trace right-sided pleural effusion. There is no pneumothorax or focal infiltrate. There is a small amount of atelectasis in the lingula. Musculoskeletal: No chest wall abnormality. No acute or significant osseous findings. Review of the MIP images confirms the above findings. CT ABDOMEN and PELVIS FINDINGS Hepatobiliary: The liver is normal. Status post cholecystectomy.There is no biliary ductal dilation. Pancreas: Normal contours without ductal dilatation. No peripancreatic fluid collection. Spleen: No splenic laceration or hematoma. Adrenals/Urinary Tract: --Adrenal glands: No adrenal hemorrhage. --Right kidney/ureter: No hydronephrosis or perinephric hematoma. --Left kidney/ureter: No hydronephrosis or perinephric hematoma. --Urinary bladder:  Unremarkable. Stomach/Bowel: --Stomach/Duodenum: No hiatal hernia or other gastric abnormality. Normal duodenal course and caliber. --Small bowel: No dilatation or inflammation. --Colon: No focal abnormality. --Appendix: Normal. Vascular/Lymphatic: Atherosclerotic calcification is present within the non-aneurysmal abdominal aorta, without hemodynamically significant stenosis. --No retroperitoneal lymphadenopathy. --No mesenteric lymphadenopathy. --No pelvic or inguinal lymphadenopathy. Reproductive: Unremarkable Other: There is a partially visualized 1.7 x 1.2 cm subcutaneous nodule in the left gluteal fold. The abdominal wall is normal. Musculoskeletal. No acute displaced fractures. Review of the MIP images confirms the above findings. IMPRESSION: 1. No acute thoracic, abdominal or pelvic injury. 2. Cardiomegaly with significant wall thickening of the left ventricle. 3. Trace right-sided pleural effusion. 4. Dilated main pulmonary artery which can be seen in patients with elevated pulmonary artery pressures. 5. Partially visualized 1.7 x 1.2 cm subcutaneous nodule in the left gluteal fold. Recommend correlation with physical examination. This could represent a sebaceous cyst. Aortic Atherosclerosis (ICD10-I70.0). Electronically Signed   By: Constance Holster M.D.   On: 05/19/2019 11:06     IMPRESSION AND PLAN:   *Chest pain, atypical of 3 days.  Will repeat troponin.  Admit to telemetry unit.  Scheduled for stress test.  Continue aspirin, Coreg and statin.  Nitro PRN.  *COPD.  Stable.  Nebulizer as needed.  Continue inhalers.  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  Continue patient's long-acting insulin and pre-meal insulin.  Sliding scale insulin ordered.  Diabetic diet.  *Hypertension.  Continue home medications.  *Chronic diastolic CHF.  No signs of fluid overload.  Continue torsemide from home.  *Gastritis/GERD.  Epigastric pain after eating.  Will start  PPI.  DVT prophylaxis with Lovenox  All the records are reviewed and case discussed with ED provider. Management plans discussed with the patient, family and they are in agreement.  CODE STATUS: Full code  TOTAL TIME TAKING  CARE OF THIS PATIENT: 40 minutes.   Leia Alf Rily Nickey M.D on 05/19/2019 at 12:02 PM  Between 7am to 6pm - Pager - [phone]  After 6pm go to [email or web address] - password [email or web address] [facility]  Office  [phone]  CC: Primary care physician; [facility]  Note: This dictation was prepared with Dragon dictation along with smaller phrase technology. Any transcriptional errors that result from this process are unintentional.

## 2019-05-19 NOTE — ED Notes (Signed)
Dr Jari Pigg at bedside- pt requesting to go to the bathroom - this RN to bedside to assist pt to toilet

## 2019-05-19 NOTE — ED Notes (Signed)
Admitting Dr at bedside

## 2019-05-19 NOTE — ED Notes (Signed)
First Nurse Note: Pt to ED via [facility] from home for multiple complaints. Pt reports having pain in bilateral LE x 3 months. Pt also reporting intermittent sharp pain in her left shoulder but states that sometimes the pain is in her lower abdomen as well. Pt has hx/o CHF, DM, and defibrillator insertion.  Pt arrives to ED in NAD, sitting comfortably in wheelchair at this time.  Per EMS pts vital signs as follows: BP: 121/80 P: 60 RR: 16 Temp: 97.0- oral SpO2: 98% on room air CBG: 287 12 lead: WNL

## 2019-05-19 NOTE — ED Notes (Signed)
Called CT and informed them pt was ready for scans

## 2019-05-20 DIAGNOSIS — R079 Chest pain, unspecified: Secondary | ICD-10-CM

## 2019-05-20 DIAGNOSIS — I421 Obstructive hypertrophic cardiomyopathy: Secondary | ICD-10-CM

## 2019-05-20 LAB — GLUCOSE, CAPILLARY
Glucose-Capillary: 78 mg/dL (ref 70–99)
Glucose-Capillary: 173 mg/dL — ABNORMAL HIGH (ref 70–99)
Glucose-Capillary: 192 mg/dL — ABNORMAL HIGH (ref 70–99)
Glucose-Capillary: 202 mg/dL — ABNORMAL HIGH (ref 70–99)

## 2019-05-20 MED ORDER — CARVEDILOL 12.5 MG PO TABS
12.5000 mg | ORAL_TABLET | Freq: Two times a day (BID) | ORAL | Status: DC
  Administered 2019-05-21: 12.5 mg via ORAL
  Filled 2019-05-20: qty 1

## 2019-05-20 MED ORDER — TORSEMIDE 20 MG PO TABS
30.0000 mg | ORAL_TABLET | Freq: Every day | ORAL | Status: DC
  Administered 2019-05-21 – 2019-05-22 (×2): 30 mg via ORAL
  Filled 2019-05-20 (×2): qty 2

## 2019-05-20 MED ORDER — NITROGLYCERIN 0.4 MG SL SUBL: 0.4000 mg | SUBLINGUAL_TABLET | SUBLINGUAL | 2 refills | Status: DC | PRN

## 2019-05-20 NOTE — Progress Notes (Addendum)
Physical Therapy Evaluation Patient Details Name: Julie Bender MRN: [id number] DOB: Jul 06, 1961 Today's Date: 05/20/2019   History of Present Illness  Julie Bender is a 58 y.o. female who presented to the hospital ED on 05/19/2019 with complaints of shortness of breath and L chest pain nas well as bilateral LE and tingling and mubness in hands and feet which is chronic. Patient addmitted with diagnosis of chest pain. Cardiology consult reported troponin chronically elevated.  Relevant PMH includes COPD, HTN, DMII, hypertrophic cardiomyopathy, diastolic CHF, history of heart catheterization.    Clinical Impression  Patient very sleepy and complaining of overall feeling of malaise, fatigue and shortness of breath with minimal activity. Alert enough and oriented to provide a detailed history. Patient lives with her mother and sister who have health problems and are unable to assist her should she discharge home. Prior to hospitalization patient ambulated short distances independently except on bad days when she uses her Rollator walker. She was I with ADLs and had help driving and completing yard and housework. Upon evaluation, patient was able to complete supine <> sit with HOB elevated with extra time to complete task. She complained of lightheadedness upon standing and was very unstable in standing including tremors through the body as if she was allowing her muscles to relax due to being sleepy followed by her righting reflexes kicking in to prevent falls. Assessed orthostatic values and found her blood pressure dropped from 113/67 to 97/61 when moving from sitting to standing. Pateint unable to stand for more than 2 minutes. Reported feeling very short of breath but her O2 sat remained in low to mid 90s on room air in sitting and standing. Breathing raspy at times. HR WFL throughout session. Informed nursing of vitals and patient response to mobility. Did not attempt ambulation due to difficulty with  tolerating standing. Educated patient to ask for assistance when getting out of bed and set bed alarm. Informed nursing of signs of increased fall risk. Patient appears to have experienced a decrease in functional independence and strength and would benefit from physical therapy to address her current limitations. Patient's limitations appear to be related to her feeling of malaise, shortness of breath, and pain at this point and she is expected to have improved tolerance for mobility as her medical condition improves so home with home health PT is recommended at this point pending improvement. Otherwise, patient would require short term rehabilitation prior to going home to improve her functional mobility and safety. Patient would benefit from continued physical therapy to address impairments and functional limitations (see PT problem list below) to work towards stated goals and return to PLOF or maximal functional independence.      Follow Up Recommendations Supervision for mobility/OOB;Home health PT(pending medical improvement. 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  Transfers Overall transfer level: Modified independent Equipment used: None             General transfer comment: Patient completed sit <> stand  at edge of bed x 2, complaining of increased lightheadedness each time that made her feel the need to sit. Also complains of strong fatigue. Checked blood pressure second time and it dropped  from 113/65 in sitting to 97/61 in standing. Patient did not tolerate standing for 3 minutes. Very sleepy and tremor through body as if she was allowing her muscles to relax and her righting reflex was initiating. Required cuing to open eyes and keep them open.  Complains of urgently feeling she cannot breathe and has some wheezing although O2 sat stays in low 90s, the same as when she is sitting. reports chest discomfort when returning to bed but no significant change in telemetry or HR.  Ambulation/Gait             General Gait Details: Did not attempt gait due to safety concerns related to patient's sleepiness, instability in standing, lightheadedness and shortness of breath. Educated patient that she should not get up without assistance and activated bed alarm. notified nursing of increased difficulty with balance.  Stairs            Wheelchair Mobility    Modified Rankin (Stroke Patients Only)       Balance Overall balance assessment: Needs assistance Sitting-balance support: Feet supported Sitting balance-Leahy Scale: Good Sitting balance - Comments: Patient appears she may fall asleep sitting up. Supervised constantly but was able to move in and out of bed with good trunk control when requested.   Standing balance support: No upper extremity supported;During functional activity Standing balance-Leahy Scale: Fair Standing balance comment: Patient able to stand at edge of bed without AD, but unstable with occasional tremors through body and very sleepy as if she lets her muscles relax and a righting reflex kicks in causing the tremor.                             Pertinent Vitals/Pain Pain Assessment: 0-10 Pain Score: 4  Pain Location: headache and stomach pain start of visit Pain Descriptors / Indicators: Burning(stomach) Pain Intervention(s): Limited activity within patient's tolerance;Monitored during session;Premedicated before session    Home Living  Family/patient expects to be discharged to:: Private residence Living Arrangements: Parent;Other relatives(mother and sister) Available Help at Discharge: Family(mother and sister - neither can help her) Type of Home: House Home Access: Stairs to enter Entrance Stairs-Rails: Right Entrance Stairs-Number of Steps: 3-4 Home Layout: One level Home Equipment: Walker - 4 wheels;Shower seat Additional Comments: lives with mother and sister who are both unwell and unable to assist each other.    Prior Function Level of Independence: Independent         Comments: able to ambulate short distances with no AD and longer distances with rollater if she is having a bad day. Sister drives and patient uses motorized buggy at grocery store. Has hired assistance for housekeeping. Gardener for yardwork.     Hand Dominance   Dominant Hand: Right    Extremity/Trunk Assessment   Upper Extremity Assessment Upper Extremity Assessment: Generalized weakness(stated she felt out of breath while testing UE with push/pull. only gave 2+/5 L side second to overall fatigue and distraction by feeling difficulty breathing)    Lower Extremity Assessment Lower Extremity Assessment: Generalized weakness    Cervical / Trunk Assessment Cervical / Trunk Assessment: Kyphotic  Communication   Communication: No difficulties  Cognition Arousal/Alertness: Lethargic Behavior During Therapy: WFL for tasks assessed/performed(sleepy) Overall Cognitive Status: Within Functional Limits for  tasks assessed                                 General Comments: very sleepy and not feeling well      General Comments      Exercises Other Exercises Other Exercises: extra time taken to assess orthostatic vitals and educate patient about fall risk and ways to reduce falls.   Assessment/Plan    PT Assessment Patient needs continued PT services  PT Problem List Decreased strength;Decreased mobility;Decreased  safety awareness;Decreased range of motion;Decreased coordination;Obesity;Decreased knowledge of precautions;Decreased activity tolerance;Cardiopulmonary status limiting activity;Decreased balance;Pain       PT Treatment Interventions DME instruction;Therapeutic activities;Gait training;Therapeutic exercise;Patient/family education;Stair training;Balance training;Functional mobility training;Neuromuscular re-education    PT Goals (Current goals can be found in the Care Plan section)  Acute Rehab PT Goals Patient Stated Goal: feel better and find out what is wrong with her so she can go home PT Goal Formulation: With patient Time For Goal Achievement: 06/03/19 Potential to Achieve Goals: Good    Frequency Min 2X/week   Barriers to discharge Decreased caregiver support patient's family can provide no support at home    Co-evaluation               AM-PAC PT "6 Clicks" Mobility  Outcome Measure Help needed turning from your back to your side while in a flat bed without using bedrails?: A Little Help needed moving from lying on your back to sitting on the side of a flat bed without using bedrails?: A Little Help needed moving to and from a bed to a chair (including a wheelchair)?: A Little Help needed standing up from a chair using your arms (e.g., wheelchair or bedside chair)?: A Little Help needed to walk in hospital room?: A Lot Help needed climbing 3-5 steps with a railing? : A Lot 6 Click Score: 16    End of Session Equipment Utilized During Treatment: Gait belt Activity Tolerance: Patient limited by lethargy;Patient limited by fatigue;Patient limited by pain Patient left: in bed;with call bell/phone within reach;with bed alarm set Nurse Communication: Mobility status;Precautions;Other (comment)(Orthostatic response, fall risk, results of session, demeanor of patient, complaints of pain (HA, chest, stomach).) PT Visit Diagnosis: Unsteadiness on feet (R26.81);Muscle weakness  (generalized) (M62.81);Difficulty in walking, not elsewhere classified (R26.2)    Time: [id number] PT Time Calculation (min) (ACUTE ONLY): 25 min   Charges:   PT Evaluation $PT Eval Moderate Complexity: 1 Mod PT Treatments $Therapeutic Exercise: 8-22 mins        Everlean Alstrom. Graylon Good, PT, DPT 05/20/19, 4:01 PM

## 2019-05-20 NOTE — Care Management Obs Status (Signed)
[facility] NOTIFICATION   Patient Details  Name: Kizmet Endlich MRN: [id number] Date of Birth: 31-Oct-1960   Medicare Observation Status Notification Given:  Yes    Maurie Musco A Kinya Meine, RN 05/20/2019, 1:58 PM

## 2019-05-20 NOTE — Progress Notes (Signed)
[facility] [place] NAME: Julie Bender Drilling    MR#:  [id number]  DATE OF BIRTH:  1960/12/07  SUBJECTIVE:  CHIEF COMPLAINT:   Chief Complaint  Patient presents with  . Shortness of Breath  . Chest Pain   Better shortness of breath but still has sharp chest pain on the left side with radiation to left shoulder intermittently.  She also complains of epigastric pain. REVIEW OF SYSTEMS:  Review of Systems  Constitutional: Negative for chills, fever and malaise/fatigue.  HENT: Negative for sore throat.   Eyes: Negative for blurred vision and double vision.  Respiratory: Positive for shortness of breath. Negative for cough, hemoptysis, wheezing and stridor.   Cardiovascular: Positive for chest pain. Negative for palpitations, orthopnea and leg swelling.  Gastrointestinal: Negative for abdominal pain, blood in stool, diarrhea, melena, nausea and vomiting.  Genitourinary: Negative for dysuria, flank pain and hematuria.  Musculoskeletal: Negative for back pain and joint pain.  Neurological: Negative for dizziness, sensory change, focal weakness, seizures, loss of consciousness, weakness and headaches.  Endo/Heme/Allergies: Negative for polydipsia.  Psychiatric/Behavioral: Negative for depression. The patient is not nervous/anxious.     DRUG ALLERGIES:  No Known Allergies VITALS:  Blood pressure 113/67, pulse 60, temperature 98.1 F (36.7 C), resp. rate 18, height 5\' 4"  (1.626 m), weight 98.3 kg, SpO2 97 %. PHYSICAL EXAMINATION:  Physical Exam Constitutional:      General: She is not in acute distress.    Appearance: Normal appearance. She is obese.  HENT:     Head: Normocephalic.  Eyes:     General: No scleral icterus.    Conjunctiva/sclera: Conjunctivae normal.     Pupils: Pupils are equal, round, and reactive to light.  Neck:     Musculoskeletal: Normal range of motion and neck supple.     Vascular: No JVD.     Trachea: No tracheal  deviation.  Cardiovascular:     Rate and Rhythm: Normal rate and regular rhythm.     Heart sounds: Normal heart sounds. No murmur. No gallop.   Pulmonary:     Effort: Pulmonary effort is normal. No respiratory distress.     Breath sounds: Normal breath sounds. No wheezing or rales.  Chest:     Chest wall: Tenderness present.  Abdominal:     General: Bowel sounds are normal. There is no distension.     Palpations: Abdomen is soft.     Tenderness: There is abdominal tenderness. There is no rebound.  Musculoskeletal: Normal range of motion.        General: No tenderness.     Right lower leg: No edema.     Left lower leg: No edema.  Skin:    Findings: No erythema or rash.  Neurological:     General: No focal deficit present.     Mental Status: She is alert and oriented to person, place, and time.     Cranial Nerves: No cranial nerve deficit.  Psychiatric:        Mood and Affect: Mood normal.    LABORATORY PANEL:  Female CBC Recent Labs  Lab 05/19/19 0835  WBC 7.2  HGB 10.1*  HCT 31.9*  PLT 253   ------------------------------------------------------------------------------------------------------------------ Chemistries  Recent Labs  Lab 05/19/19 0835  NA 137  K 4.5  CL 102  CO2 25  GLUCOSE 213*  BUN 19  CREATININE 1.20*  CALCIUM 8.6*   RADIOLOGY:  No results found. ASSESSMENT AND PLAN:   *Atypical  chest pain, a Continue aspirin, Coreg and statin.  Nitro PRN.  Echocardiogram and follow-up with Dr. Rockey Situ tomorrow per Dr. Johnsie Cancel.  *COPD.  Stable.  Nebulizer as needed.  Continue inhalers.  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  Continue patient's long-acting insulin and pre-meal insulin.  Sliding scale insulin ordered.  Diabetic diet.  *Hypertension.  Continue home medications.  *Chronic diastolic CHF.  No signs of fluid overload.  Continue torsemide.  *Gastritis/GERD.  Epigastric pain after eating.  Will start  PPI. I discussed with Dr. Johnsie Cancel. All the records are reviewed and case discussed with Care Management/Social Worker. Management plans discussed with the patient, family and they are in agreement.  CODE STATUS: Full Code  TOTAL TIME TAKING CARE OF THIS PATIENT: 33 minutes.   More than 50% of the time was spent in counseling/coordination of care: YES  POSSIBLE D/C IN 1 DAYS, DEPENDING ON CLINICAL CONDITION.   Demetrios Loll M.D on 05/20/2019 at 1:38 PM  Between 7am to 6pm - Pager - [phone]  After 6pm go to [email or web address] - [facility]

## 2019-05-20 NOTE — Consult Note (Signed)
CARDIOLOGY CONSULT NOTE       Patient ID: Julie Bender MRN: [id number] DOB/AGE: [birth year] 60 y.o.  Admit date: 05/19/2019 Referring Physician: Darvin Neighbours Primary Physician: [facility] Primary Cardiologist: Rockey Situ Reason for Consultation: Dsypnea/Chest pain  Active Problems:   Chest pain   HPI:  58 y.o. obese diabetic with history of HOCM. She has had a prophylactic AICD placed Cath in 2017 with no significant CAD Hospitalized in July with COPD exacerbation similar to this presentation Rx with short cours of diuretics and iv steroids. Usually with COPD flairs she has chronic mild elevation in Troponin and BNP in 400 range again this admission. During my interview she primarily complains of exhaustion, and being fatigued and tired Denies SSCP except for some brief sharp pains in chest Friday She has chronic dyspnea. ECG with no acute changes and troponin 90-82 with no delta. This am again she only complains of fatigue and leg pains. BS is poorly controlled with neuropathy CTA with no PE suggested dilated PA and ? Elevated PA pressures CXR with mild cephalization    ROS All other systems reviewed and negative except as noted above  Past Medical History:  Diagnosis Date  . CHF (congestive heart failure) ([place])   . COPD (chronic obstructive pulmonary disease) ([place])   . Diabetes mellitus without complication ([place])   . Diabetic retinopathy ([place])   . History of placement of internal cardiac defibrillator   . Hypertension   . Hypertrophic cardiomyopathy ([facility])     Family History  Problem Relation Age of Onset  . Hypertrophic cardiomyopathy Sister     Social History   Socioeconomic History  . Marital status: Divorced    Spouse name: Not on file  . Number of children: Not on file  . Years of education: Not on file  . Highest education level: Not on file  Occupational History  . Not on file  Social Needs  . Financial resource strain: Not on file  . Food insecurity   Worry: Not on file    Inability: Not on file  . Transportation needs    Medical: Not on file    Non-medical: Not on file  Tobacco Use  . Smoking status: Passive Smoke Exposure - Never Smoker  . Smokeless tobacco: Never Used  Substance and Sexual Activity  . Alcohol use: No  . Drug use: Never  . Sexual activity: Not on file  Lifestyle  . Physical activity    Days per week: Not on file    Minutes per session: Not on file  . Stress: Not on file  Relationships  . Social Herbalist on phone: Not on file    Gets together: Not on file    Attends religious service: Not on file    Active member of club or organization: Not on file    Attends meetings of clubs or organizations: Not on file    Relationship status: Not on file  . Intimate partner violence    Fear of current or ex partner: Not on file    Emotionally abused: Not on file    Physically abused: Not on file    Forced sexual activity: Not on file  Other Topics Concern  . Not on file  Social History Narrative  . Not on file    Past Surgical History:  Procedure Laterality Date  . CARDIAC DEFIBRILLATOR PLACEMENT    . CHOLECYSTECTOMY       . aspirin EC  81 mg Oral Daily  .  atorvastatin  40 mg Oral q1800  . carvedilol  25 mg Oral BID WC  . enoxaparin (LOVENOX) injection  40 mg Subcutaneous Q24H  . gabapentin  300 mg Oral BID  . insulin aspart  0-15 Units Subcutaneous TID WC  . insulin aspart  0-5 Units Subcutaneous QHS  . insulin aspart  7 Units Subcutaneous TID WC  . insulin detemir  42 Units Subcutaneous QHS  . pantoprazole  40 mg Oral Daily  . sodium chloride flush  3 mL Intravenous Q12H  . torsemide  60 mg Oral Daily  . traZODone  150 mg Oral QHS  . verapamil  120 mg Oral Daily     Physical Exam: Blood pressure 113/67, pulse 60, temperature 98.1 F (36.7 C), resp. rate 18, height 5\' 4"  (1.626 m), weight 98.3 kg, SpO2 97 %.    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  No HSM or HJR Distal pulses intact with no bruits No edema Neuro non-focal Skin warm and dry No muscular weakness   Labs:   Lab Results  Component Value Date   WBC 7.2 05/19/2019   HGB 10.1 (L) 05/19/2019   HCT 31.9 (L) 05/19/2019   MCV 84.2 05/19/2019   PLT 253 05/19/2019    Recent Labs  Lab 05/19/19 0835  NA 137  K 4.5  CL 102  CO2 25  BUN 19  CREATININE 1.20*  CALCIUM 8.6*  GLUCOSE 213*   Lab Results  Component Value Date   TROPONINI 0.34 (HH) 04/18/2018    Lab Results  Component Value Date   CHOL 90 09/05/2016   Lab Results  Component Value Date   HDL 30 (L) 09/05/2016   Lab Results  Component Value Date   LDLCALC 48 09/05/2016   Lab Results  Component Value Date   TRIG 60 09/05/2016   Lab Results  Component Value Date   CHOLHDL 3.0 09/05/2016   No results found for: LDLDIRECT    Radiology: Dg Chest 2 View  Result Date: 05/19/2019 CLINICAL DATA:  Bilateral lower extremity pain. Shortness of breath. EXAM: CHEST - 2 VIEW COMPARISON:  02/23/2019 FINDINGS: There is mild bilateral interstitial thickening. There is no focal consolidation. There is no pleural effusion or pneumothorax. There is stable cardiomegaly. There is a dual lead cardiac pacemaker. There is no acute osseous abnormality. IMPRESSION: Cardiomegaly with mild pulmonary vascular congestion. Electronically Signed   By: Kathreen Devoid   On: 05/19/2019 09:22   Ct Angio Chest Pe W And/or Wo Contrast  Result Date: 05/19/2019 CLINICAL DATA:  Bilateral lower extremity pain and shortness of breath. EXAM: CT ANGIOGRAPHY CHEST CT ABDOMEN AND PELVIS WITH CONTRAST TECHNIQUE: Multidetector CT imaging of the chest was performed  using the standard protocol during bolus administration of intravenous contrast. Multiplanar CT image reconstructions and MIPs were obtained to evaluate the vascular anatomy. Multidetector CT imaging of the abdomen and pelvis was performed using the standard protocol during bolus administration of intravenous contrast. CONTRAST:  15mL OMNIPAQUE IOHEXOL 350 MG/ML SOLN COMPARISON:  CT dated 04/18/2018 FINDINGS: CTA CHEST FINDINGS Cardiovascular: Contrast injection is sufficient to demonstrate satisfactory opacification of the pulmonary arteries to the segmental level. There is no  pulmonary embolus. The main pulmonary artery is dilated measuring approximately 3.8 cm in diameter. There is no CT evidence of acute right heart strain. There are mild atherosclerotic changes of the visualized thoracic aorta. Heart size is enlarged. There is significant wall thickening of the left ventricle. 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 There is no significant pericardial effusion. Mediastinum/Nodes: --No mediastinal or hilar lymphadenopathy. --No axillary lymphadenopathy. --No supraclavicular lymphadenopathy. --a large left-sided thyroid nodule is again noted. --The esophagus is unremarkable Lungs/Pleura: The lung volumes are low. The trachea is unremarkable. There is a trace right-sided pleural effusion. There is no pneumothorax or focal infiltrate. There is a small amount of atelectasis in the lingula. Musculoskeletal: No chest wall abnormality. No acute or significant osseous findings. Review of the MIP images confirms the above findings. CT ABDOMEN and PELVIS FINDINGS Hepatobiliary: The liver is normal. Status post cholecystectomy.There is no biliary ductal dilation. Pancreas: Normal contours without ductal dilatation. No peripancreatic fluid collection. Spleen: No splenic laceration or hematoma. Adrenals/Urinary Tract: --Adrenal glands: No adrenal hemorrhage. --Right kidney/ureter: No hydronephrosis or perinephric hematoma.  --Left kidney/ureter: No hydronephrosis or perinephric hematoma. --Urinary bladder: Unremarkable. Stomach/Bowel: --Stomach/Duodenum: No hiatal hernia or other gastric abnormality. Normal duodenal course and caliber. --Small bowel: No dilatation or inflammation. --Colon: No focal abnormality. --Appendix: Normal. Vascular/Lymphatic: Atherosclerotic calcification is present within the non-aneurysmal abdominal aorta, without hemodynamically significant stenosis. --No retroperitoneal lymphadenopathy. --No mesenteric lymphadenopathy. --No pelvic or inguinal lymphadenopathy. Reproductive: Unremarkable Other: There is a partially visualized 1.7 x 1.2 cm subcutaneous nodule in the left gluteal fold. The abdominal wall is normal. Musculoskeletal. No acute displaced fractures. Review of the MIP images confirms the above findings. IMPRESSION: 1. No acute thoracic, abdominal or pelvic injury. 2. Cardiomegaly with significant wall thickening of the left ventricle. 3. Trace right-sided pleural effusion. 4. Dilated main pulmonary artery which can be seen in patients with elevated pulmonary artery pressures. 5. Partially visualized 1.7 x 1.2 cm subcutaneous nodule in the left gluteal fold. Recommend correlation with physical examination. This could represent a sebaceous cyst. Aortic Atherosclerosis (ICD10-I70.0). Electronically Signed   By: Constance Holster M.D.   On: 05/19/2019 11:06   Ct Abdomen Pelvis W Contrast  Result Date: 05/19/2019 CLINICAL DATA:  Bilateral lower extremity pain and shortness of breath. EXAM: CT ANGIOGRAPHY CHEST CT ABDOMEN AND PELVIS WITH CONTRAST TECHNIQUE: Multidetector CT imaging of the chest was performed using the standard protocol during bolus administration of intravenous contrast. Multiplanar CT image reconstructions and MIPs were obtained to evaluate the vascular anatomy. Multidetector CT imaging of the abdomen and pelvis was performed using the standard protocol during bolus administration of  intravenous contrast. CONTRAST:  160mL OMNIPAQUE IOHEXOL 350 MG/ML SOLN COMPARISON:  CT dated 04/18/2018 FINDINGS: CTA CHEST FINDINGS Cardiovascular: Contrast injection is sufficient to demonstrate satisfactory opacification of the pulmonary arteries to the segmental level. There is no pulmonary embolus. The main pulmonary artery is dilated measuring approximately 3.8 cm in diameter. There is no CT evidence of acute right heart strain. There are mild atherosclerotic changes of the visualized thoracic aorta. Heart size is enlarged. There is significant wall thickening of the left ventricle. 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 There is no significant pericardial effusion. Mediastinum/Nodes: --No mediastinal or hilar lymphadenopathy. --No axillary lymphadenopathy. --No supraclavicular lymphadenopathy. --a large left-sided thyroid nodule is again noted. --The esophagus is unremarkable Lungs/Pleura: The lung volumes are low. The trachea is unremarkable. There is a trace right-sided pleural effusion. There is no pneumothorax or focal infiltrate. There is  a small amount of atelectasis in the lingula. Musculoskeletal: No chest wall abnormality. No acute or significant osseous findings. Review of the MIP images confirms the above findings. CT ABDOMEN and PELVIS FINDINGS Hepatobiliary: The liver is normal. Status post cholecystectomy.There is no biliary ductal dilation. Pancreas: Normal contours without ductal dilatation. No peripancreatic fluid collection. Spleen: No splenic laceration or hematoma. Adrenals/Urinary Tract: --Adrenal glands: No adrenal hemorrhage. --Right kidney/ureter: No hydronephrosis or perinephric hematoma. --Left kidney/ureter: No hydronephrosis or perinephric hematoma. --Urinary bladder: Unremarkable. Stomach/Bowel: --Stomach/Duodenum: No hiatal hernia or other gastric abnormality. Normal duodenal course and caliber. --Small bowel: No dilatation or inflammation. --Colon: No focal abnormality.  --Appendix: Normal. Vascular/Lymphatic: Atherosclerotic calcification is present within the non-aneurysmal abdominal aorta, without hemodynamically significant stenosis. --No retroperitoneal lymphadenopathy. --No mesenteric lymphadenopathy. --No pelvic or inguinal lymphadenopathy. Reproductive: Unremarkable Other: There is a partially visualized 1.7 x 1.2 cm subcutaneous nodule in the left gluteal fold. The abdominal wall is normal. Musculoskeletal. No acute displaced fractures. Review of the MIP images confirms the above findings. IMPRESSION: 1. No acute thoracic, abdominal or pelvic injury. 2. Cardiomegaly with significant wall thickening of the left ventricle. 3. Trace right-sided pleural effusion. 4. Dilated main pulmonary artery which can be seen in patients with elevated pulmonary artery pressures. 5. Partially visualized 1.7 x 1.2 cm subcutaneous nodule in the left gluteal fold. Recommend correlation with physical examination. This could represent a sebaceous cyst. Aortic Atherosclerosis (ICD10-I70.0). Electronically Signed   By: Constance Holster M.D.   On: 05/19/2019 11:06    EKG: SR LVH no acute changes    ASSESSMENT AND PLAN:   1. Chest Pain:  Does not complain of active pain to me. Chronically elevated troponin mild with no delta No acute ECG changes Normal cath in 2017 Not a good candidate for non invasive stress testing due to obesity and large breast tissue. Continue ASA and beta blocker. Do not feel further inpatient testing needed Dr Rockey Situ to see in am At some point she may need f/u right and left cath will repeat echo since last one was 04/18/18 and only suggested mild resting LVOT gradient and mild to moderate MR.  Not clear why she had prophylactic AICD placed for HOCM but not more definitive septal myectomy surgery with possible MV repair Continue beta blocker and verapamil for HOCM and limited diuresis so as not to worsen LVOT gradient   2.  DM:  Discussed low carb diet.  Target  hemoglobin A1c is 6.5 or less.  Continue current medications.  3. Diastolic CHF: related to DM, obesity , LVH and HOCM limited diuresis with mildly elevated BNP and cephalization on CXR   Signed: Jenkins Rouge 05/20/2019, 11:20 AM

## 2019-05-21 ENCOUNTER — Observation Stay (HOSPITAL_COMMUNITY)
Admit: 2019-05-21 | Discharge: 2019-05-21 | Disposition: A | Discharge status: Home / Self Care | Payer: Medicare Other | Attending: Internal Medicine | Admitting: Internal Medicine

## 2019-05-21 DIAGNOSIS — G4733 Obstructive sleep apnea (adult) (pediatric): Secondary | ICD-10-CM | POA: Diagnosis present

## 2019-05-21 DIAGNOSIS — I340 Nonrheumatic mitral (valve) insufficiency: Secondary | ICD-10-CM | POA: Diagnosis present

## 2019-05-21 DIAGNOSIS — I272 Pulmonary hypertension, unspecified: Secondary | ICD-10-CM | POA: Diagnosis present

## 2019-05-21 DIAGNOSIS — R0600 Dyspnea, unspecified: Secondary | ICD-10-CM | POA: Diagnosis not present

## 2019-05-21 DIAGNOSIS — I2510 Atherosclerotic heart disease of native coronary artery without angina pectoris: Secondary | ICD-10-CM | POA: Diagnosis present

## 2019-05-21 DIAGNOSIS — K219 Gastro-esophageal reflux disease without esophagitis: Secondary | ICD-10-CM | POA: Diagnosis present

## 2019-05-21 DIAGNOSIS — Z20828 Contact with and (suspected) exposure to other viral communicable diseases: Secondary | ICD-10-CM | POA: Diagnosis present

## 2019-05-21 DIAGNOSIS — E1140 Type 2 diabetes mellitus with diabetic neuropathy, unspecified: Secondary | ICD-10-CM | POA: Diagnosis present

## 2019-05-21 DIAGNOSIS — Z7982 Long term (current) use of aspirin: Secondary | ICD-10-CM | POA: Diagnosis not present

## 2019-05-21 DIAGNOSIS — I110 Hypertensive heart disease with heart failure: Secondary | ICD-10-CM | POA: Diagnosis present

## 2019-05-21 DIAGNOSIS — I422 Other hypertrophic cardiomyopathy: Secondary | ICD-10-CM | POA: Diagnosis present

## 2019-05-21 DIAGNOSIS — Z79899 Other long term (current) drug therapy: Secondary | ICD-10-CM | POA: Diagnosis not present

## 2019-05-21 DIAGNOSIS — Z8249 Family history of ischemic heart disease and other diseases of the circulatory system: Secondary | ICD-10-CM | POA: Diagnosis not present

## 2019-05-21 DIAGNOSIS — R0789 Other chest pain: Secondary | ICD-10-CM | POA: Diagnosis present

## 2019-05-21 DIAGNOSIS — R0602 Shortness of breath: Secondary | ICD-10-CM

## 2019-05-21 DIAGNOSIS — K2970 Gastritis, unspecified, without bleeding: Secondary | ICD-10-CM | POA: Diagnosis present

## 2019-05-21 DIAGNOSIS — Z95810 Presence of automatic (implantable) cardiac defibrillator: Secondary | ICD-10-CM | POA: Diagnosis not present

## 2019-05-21 DIAGNOSIS — D649 Anemia, unspecified: Secondary | ICD-10-CM | POA: Diagnosis present

## 2019-05-21 DIAGNOSIS — Z794 Long term (current) use of insulin: Secondary | ICD-10-CM | POA: Diagnosis not present

## 2019-05-21 DIAGNOSIS — I5033 Acute on chronic diastolic (congestive) heart failure: Secondary | ICD-10-CM | POA: Diagnosis present

## 2019-05-21 DIAGNOSIS — J441 Chronic obstructive pulmonary disease with (acute) exacerbation: Secondary | ICD-10-CM | POA: Diagnosis present

## 2019-05-21 DIAGNOSIS — E11319 Type 2 diabetes mellitus with unspecified diabetic retinopathy without macular edema: Secondary | ICD-10-CM | POA: Diagnosis present

## 2019-05-21 DIAGNOSIS — E1165 Type 2 diabetes mellitus with hyperglycemia: Secondary | ICD-10-CM | POA: Diagnosis not present

## 2019-05-21 DIAGNOSIS — I421 Obstructive hypertrophic cardiomyopathy: Secondary | ICD-10-CM | POA: Diagnosis present

## 2019-05-21 DIAGNOSIS — Z6837 Body mass index (BMI) 37.0-37.9, adult: Secondary | ICD-10-CM | POA: Diagnosis not present

## 2019-05-21 DIAGNOSIS — Z7722 Contact with and (suspected) exposure to environmental tobacco smoke (acute) (chronic): Secondary | ICD-10-CM | POA: Diagnosis present

## 2019-05-21 LAB — CBC
HCT: 32.9 % — ABNORMAL LOW (ref 36.0–46.0)
Hemoglobin: 10.5 g/dL — ABNORMAL LOW (ref 12.0–15.0)
MCH: 26.8 pg (ref 26.0–34.0)
MCHC: 31.9 g/dL (ref 30.0–36.0)
MCV: 83.9 fL (ref 80.0–100.0)
Platelets: 287 10*3/uL (ref 150–400)
RBC: 3.92 MIL/uL (ref 3.87–5.11)
RDW: 15.8 % — ABNORMAL HIGH (ref 11.5–15.5)
WBC: 7.3 10*3/uL (ref 4.0–10.5)
nRBC: 0 % (ref 0.0–0.2)

## 2019-05-21 LAB — GLUCOSE, CAPILLARY
Glucose-Capillary: 117 mg/dL — ABNORMAL HIGH (ref 70–99)
Glucose-Capillary: 245 mg/dL — ABNORMAL HIGH (ref 70–99)
Glucose-Capillary: 197 mg/dL — ABNORMAL HIGH (ref 70–99)
Glucose-Capillary: 173 mg/dL — ABNORMAL HIGH (ref 70–99)

## 2019-05-21 LAB — TSH: TSH: 0.654 u[IU]/mL (ref 0.350–4.500)

## 2019-05-21 LAB — ECHOCARDIOGRAM COMPLETE
Height: 64 in
Weight: 3448.35 oz

## 2019-05-21 MED ORDER — CARVEDILOL 3.125 MG PO TABS
3.1250 mg | ORAL_TABLET | Freq: Two times a day (BID) | ORAL | Status: DC
  Administered 2019-05-21 – 2019-05-23 (×4): 3.125 mg via ORAL
  Filled 2019-05-21 (×4): qty 1

## 2019-05-21 MED ORDER — VERAPAMIL HCL ER 180 MG PO TBCR: 180.0000 mg | EXTENDED_RELEASE_TABLET | Freq: Every day | ORAL | Status: DC

## 2019-05-21 MED ORDER — PERFLUTREN LIPID MICROSPHERE
1.0000 mL | INTRAVENOUS | Status: AC | PRN
  Administered 2019-05-21: 2 mL via INTRAVENOUS
  Filled 2019-05-21: qty 10

## 2019-05-21 MED ORDER — VERAPAMIL HCL ER 240 MG PO TBCR
240.0000 mg | EXTENDED_RELEASE_TABLET | Freq: Every day | ORAL | Status: DC
  Administered 2019-05-22 – 2019-05-26 (×4): 240 mg via ORAL
  Filled 2019-05-21 (×5): qty 1

## 2019-05-21 NOTE — Plan of Care (Signed)
  Problem: Clinical Measurements: Goal: Will remain free from infection Outcome: Progressing Goal: Cardiovascular complication will be avoided Outcome: Progressing   Problem: Elimination: Goal: Will not experience complications related to bowel motility Outcome: Progressing   Problem: Pain Managment: Goal: General experience of comfort will improve Outcome: Progressing   Problem: Safety: Goal: Ability to remain free from injury will improve Outcome: Progressing

## 2019-05-21 NOTE — Progress Notes (Addendum)
Progress Note  Patient Name: Julie Bender Date of Encounter: 05/21/2019  Primary Cardiologist: [facility] [Previoiusly followed at [facility], now Dr. Rockey Situ at [place]   Patient denies current chest pain, palpitations, or racing HR. She is not certain what improved her earlier chest pain but denies recurrence since it disappeared. Early discomfort described as both chest pressure and sharp pain, which radiated down her arm but lasted only minutes. Denies association with food but did report pleuritic component.  Continues to report SOB, though stated she does not feel volume up. Reported that she is using her inhaler consistently without relief. Ambulated to the restroom earlier this AM and felt significantly short of breath. She is s/p thyroid biopsy but unsure of the results. She continues to note fatigue.  Inpatient Medications    Scheduled Meds:  aspirin EC  81 mg Oral Daily   atorvastatin  40 mg Oral q1800   carvedilol  12.5 mg Oral BID WC   enoxaparin (LOVENOX) injection  40 mg Subcutaneous Q24H   gabapentin  300 mg Oral BID   insulin aspart  0-15 Units Subcutaneous TID WC   insulin aspart  0-5 Units Subcutaneous QHS   insulin aspart  7 Units Subcutaneous TID WC   insulin detemir  42 Units Subcutaneous QHS   pantoprazole  40 mg Oral Daily   sodium chloride flush  3 mL Intravenous Q12H   torsemide  30 mg Oral Daily   traZODone  150 mg Oral QHS   verapamil  120 mg Oral Daily   Continuous Infusions:  PRN Meds: acetaminophen **OR** acetaminophen, albuterol, nitroGLYCERIN, ondansetron **OR** ondansetron (ZOFRAN) IV, oxyCODONE-acetaminophen, polyethylene glycol, traMADol   Vital Signs    Vitals:   05/20/19 2055 05/21/19 0352 05/21/19 0354 05/21/19 0739  BP: 119/71  (!) 122/46 134/78  Pulse: 68 62 (!) 58 62  Resp: 20   18  Temp: 97.7 F (36.5 C)  98.1 F (36.7 C) 98.4 F (36.9 C)  TempSrc: Oral  Oral Oral  SpO2: 95% 98% 95% 98%  Weight:   97.8  kg   Height:        Intake/Output Summary (Last 24 hours) at 05/21/2019 0757 Last data filed at 05/21/2019 0205 Gross per 24 hour  Intake --  Output 2600 ml  Net -2600 ml   Last 3 Weights 05/21/2019 05/20/2019 05/19/2019  Weight (lbs) 215 lb 8.4 oz 216 lb 12.8 oz 214 lb  Weight (kg) 97.76 kg 98.34 kg 97.07 kg      Telemetry    NSR, intermittently paced, rates 50-80s, ectopy, runs of NSVT - Personally Reviewed  ECG    No new tracings - Personally Reviewed  Physical Exam   GEN: No acute distress.   Neck: Difficult to assess due to body habitus Cardiac: RRR, 1/6 holosystolic murmur, rubs, or gallops.  Respiratory: Bibasilar rales, bilateral coarse breath sounds GI: Obese, nontender, non-distended  MS: Trivial bilateral lower extremity edema; No deformity. Neuro:  Nonfocal  Psych: Normal affect   Labs    High Sensitivity Troponin:   Recent Labs  Lab 05/19/19 0835 05/19/19 1146  TROPONINIHS 90* 82*      Cardiac EnzymesNo results for input(s): TROPONINI in the last 168 hours. No results for input(s): TROPIPOC in the last 168 hours.   Chemistry Recent Labs  Lab 05/19/19 0835  NA 137  K 4.5  CL 102  CO2 25  GLUCOSE 213*  BUN 19  CREATININE 1.20*  CALCIUM 8.6*  GFRNONAA 49*  GFRAA 57*  ANIONGAP 10     Hematology Recent Labs  Lab 05/19/19 0835  WBC 7.2  RBC 3.79*  HGB 10.1*  HCT 31.9*  MCV 84.2  MCH 26.6  MCHC 31.7  RDW 15.6*  PLT 253    BNP Recent Labs  Lab 05/19/19 0835  BNP 463.0*     DDimer No results for input(s): DDIMER in the last 168 hours.   Radiology    Dg Chest 2 View  Result Date: 05/19/2019 CLINICAL DATA:  Bilateral lower extremity pain. Shortness of breath. EXAM: CHEST - 2 VIEW COMPARISON:  02/23/2019 FINDINGS: There is mild bilateral interstitial thickening. There is no focal consolidation. There is no pleural effusion or pneumothorax. There is stable cardiomegaly. There is a dual lead cardiac pacemaker. There is no acute  osseous abnormality. IMPRESSION: Cardiomegaly with mild pulmonary vascular congestion. Electronically Signed   By: Kathreen Devoid   On: 05/19/2019 09:22   Ct Angio Chest Pe W And/or Wo Contrast  Result Date: 05/19/2019 CLINICAL DATA:  Bilateral lower extremity pain and shortness of breath. EXAM: CT ANGIOGRAPHY CHEST CT ABDOMEN AND PELVIS WITH CONTRAST TECHNIQUE: Multidetector CT imaging of the chest was performed using the standard protocol during bolus administration of intravenous contrast. Multiplanar CT image reconstructions and MIPs were obtained to evaluate the vascular anatomy. Multidetector CT imaging of the abdomen and pelvis was performed using the standard protocol during bolus administration of intravenous contrast. CONTRAST:  164mL OMNIPAQUE IOHEXOL 350 MG/ML SOLN COMPARISON:  CT dated 04/18/2018 FINDINGS: CTA CHEST FINDINGS Cardiovascular: Contrast injection is sufficient to demonstrate satisfactory opacification of the pulmonary arteries to the segmental level. There is no pulmonary embolus. The main pulmonary artery is dilated measuring approximately 3.8 cm in diameter. There is no CT evidence of acute right heart strain. There are mild atherosclerotic changes of the visualized thoracic aorta. Heart size is enlarged. There is significant wall thickening of the left ventricle. 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 There is no significant pericardial effusion. Mediastinum/Nodes: --No mediastinal or hilar lymphadenopathy. --No axillary lymphadenopathy. --No supraclavicular lymphadenopathy. --a large left-sided thyroid nodule is again noted. --The esophagus is unremarkable Lungs/Pleura: The lung volumes are low. The trachea is unremarkable. There is a trace right-sided pleural effusion. There is no pneumothorax or focal infiltrate. There is a small amount of atelectasis in the lingula. Musculoskeletal: No chest wall abnormality. No acute or significant osseous findings. Review of the MIP images  confirms the above findings. CT ABDOMEN and PELVIS FINDINGS Hepatobiliary: The liver is normal. Status post cholecystectomy.There is no biliary ductal dilation. Pancreas: Normal contours without ductal dilatation. No peripancreatic fluid collection. Spleen: No splenic laceration or hematoma. Adrenals/Urinary Tract: --Adrenal glands: No adrenal hemorrhage. --Right kidney/ureter: No hydronephrosis or perinephric hematoma. --Left kidney/ureter: No hydronephrosis or perinephric hematoma. --Urinary bladder: Unremarkable. Stomach/Bowel: --Stomach/Duodenum: No hiatal hernia or other gastric abnormality. Normal duodenal course and caliber. --Small bowel: No dilatation or inflammation. --Colon: No focal abnormality. --Appendix: Normal. Vascular/Lymphatic: Atherosclerotic calcification is present within the non-aneurysmal abdominal aorta, without hemodynamically significant stenosis. --No retroperitoneal lymphadenopathy. --No mesenteric lymphadenopathy. --No pelvic or inguinal lymphadenopathy. Reproductive: Unremarkable Other: There is a partially visualized 1.7 x 1.2 cm subcutaneous nodule in the left gluteal fold. The abdominal wall is normal. Musculoskeletal. No acute displaced fractures. Review of the MIP images confirms the above findings. IMPRESSION: 1. No acute thoracic, abdominal or pelvic injury. 2. Cardiomegaly with significant wall thickening of the left ventricle. 3. Trace right-sided pleural effusion. 4. Dilated main pulmonary artery which  can be seen in patients with elevated pulmonary artery pressures. 5. Partially visualized 1.7 x 1.2 cm subcutaneous nodule in the left gluteal fold. Recommend correlation with physical examination. This could represent a sebaceous cyst. Aortic Atherosclerosis (ICD10-I70.0). Electronically Signed   By: Constance Holster M.D.   On: 05/19/2019 11:06   Ct Abdomen Pelvis W Contrast  Result Date: 05/19/2019 CLINICAL DATA:  Bilateral lower extremity pain and shortness of breath.  EXAM: CT ANGIOGRAPHY CHEST CT ABDOMEN AND PELVIS WITH CONTRAST TECHNIQUE: Multidetector CT imaging of the chest was performed using the standard protocol during bolus administration of intravenous contrast. Multiplanar CT image reconstructions and MIPs were obtained to evaluate the vascular anatomy. Multidetector CT imaging of the abdomen and pelvis was performed using the standard protocol during bolus administration of intravenous contrast. CONTRAST:  153mL OMNIPAQUE IOHEXOL 350 MG/ML SOLN COMPARISON:  CT dated 04/18/2018 FINDINGS: CTA CHEST FINDINGS Cardiovascular: Contrast injection is sufficient to demonstrate satisfactory opacification of the pulmonary arteries to the segmental level. There is no pulmonary embolus. The main pulmonary artery is dilated measuring approximately 3.8 cm in diameter. There is no CT evidence of acute right heart strain. There are mild atherosclerotic changes of the visualized thoracic aorta. Heart size is enlarged. There is significant wall thickening of the left ventricle. 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 There is no significant pericardial effusion. Mediastinum/Nodes: --No mediastinal or hilar lymphadenopathy. --No axillary lymphadenopathy. --No supraclavicular lymphadenopathy. --a large left-sided thyroid nodule is again noted. --The esophagus is unremarkable Lungs/Pleura: The lung volumes are low. The trachea is unremarkable. There is a trace right-sided pleural effusion. There is no pneumothorax or focal infiltrate. There is a small amount of atelectasis in the lingula. Musculoskeletal: No chest wall abnormality. No acute or significant osseous findings. Review of the MIP images confirms the above findings. CT ABDOMEN and PELVIS FINDINGS Hepatobiliary: The liver is normal. Status post cholecystectomy.There is no biliary ductal dilation. Pancreas: Normal contours without ductal dilatation. No peripancreatic fluid collection. Spleen: No splenic laceration or hematoma.  Adrenals/Urinary Tract: --Adrenal glands: No adrenal hemorrhage. --Right kidney/ureter: No hydronephrosis or perinephric hematoma. --Left kidney/ureter: No hydronephrosis or perinephric hematoma. --Urinary bladder: Unremarkable. Stomach/Bowel: --Stomach/Duodenum: No hiatal hernia or other gastric abnormality. Normal duodenal course and caliber. --Small bowel: No dilatation or inflammation. --Colon: No focal abnormality. --Appendix: Normal. Vascular/Lymphatic: Atherosclerotic calcification is present within the non-aneurysmal abdominal aorta, without hemodynamically significant stenosis. --No retroperitoneal lymphadenopathy. --No mesenteric lymphadenopathy. --No pelvic or inguinal lymphadenopathy. Reproductive: Unremarkable Other: There is a partially visualized 1.7 x 1.2 cm subcutaneous nodule in the left gluteal fold. The abdominal wall is normal. Musculoskeletal. No acute displaced fractures. Review of the MIP images confirms the above findings. IMPRESSION: 1. No acute thoracic, abdominal or pelvic injury. 2. Cardiomegaly with significant wall thickening of the left ventricle. 3. Trace right-sided pleural effusion. 4. Dilated main pulmonary artery which can be seen in patients with elevated pulmonary artery pressures. 5. Partially visualized 1.7 x 1.2 cm subcutaneous nodule in the left gluteal fold. Recommend correlation with physical examination. This could represent a sebaceous cyst. Aortic Atherosclerosis (ICD10-I70.0). Electronically Signed   By: Constance Holster M.D.   On: 05/19/2019 11:06    Cardiac Studies   04/18/2018 Echo - Left ventricle: The cavity size was normal. There was moderate   concentric hypertrophy, septal wall appears to be moderate to   severe (apical region appears to be spared) with mild LVOT   obstruction. Systolic function was normal. The estimated ejection   fraction was in  the range of 55% to 60%. Wall motion was normal;   there were no regional wall motion abnormalities.  Features are   consistent with a pseudonormal left ventricular filling pattern,   with concomitant abnormal relaxation and increased filling   pressure (grade 2 diastolic dysfunction). - Aortic valve: There was trivial regurgitation. - Mitral valve: There was mild to moderate regurgitation. - Left atrium: The atrium was mildly dilated. - Right ventricle: Systolic function was normal. - Pulmonary arteries: Systolic pressure could not be accurately   estimated. Impressions: - Definity used.  Patient Profile     58 y.o. 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  Assessment & Plan    Atypical Chest pain, supply demand ischemia History of nonobstructive CAD by [facility] 03/2016 and HOCM s/p ICD.  --No current CP. Previous report of CP atypical. Initially suspicious for GI etiology per previous documentation. Today, however, denies association with food as above and reports pleuritic component. Remainder as above in subjective. --HS troponin minimally elevated, flat trending, and not consistent with ACS. EKG without acute changes.  --No plan for further ischemic workup this admission unless echo shows acute change including significantly reduced EF or wall motion abnormalities. No plan for stress testing due to body habitus, as previously noted. No indication for heparin at this time. Continue Lovenox for DVT prophylaxis. --Previous echo as above noting mild resting LVOT gradient and mild to moderate MR. Consider chest pain due to worsening MR, pending updated echo this admission.  --Continue medical management with PTA ASA, BB, verapamil, statin. Continue PPI.  --Aggressive risk factor modification recommended.   HFpEF (EF 55-60%) --Not volume overloaded on exam. SOB etiology  likely mulifactorial given known COPD. BNP 463. Previous 02/2019 dry weight 215lbs.  Current weight 215lbs. -4L for admission. -2.6 --Continue to monitor I/Os, daily weights. Daily BMET. Ordered labs as no labs this AM. --Continue PTA torsemide and medical management as above. Caution with diuresis given LVOT gradient. Further recommendations pending updated echo with previous echo as above.  Mild to moderate MR --Pending updated echo.  HTN --Currently well controlled.  --05/14/19 [facility] visit with losartan / spironolactone d/c'd due to hypotension. BP still low 7/20 and verapamil cut to 240mg  daily.  --Continue BB, verapamil, torsemide.  --Caution with diuresis given LVOT gradient as above.  IDDM with neuropathy --History documented of upper and lower extremity paresthesias --05/19/2019 A1C 7.2. --Per IM  Thyroid nodule, 3.6cm --Noted at 02/2019 admission. Patient reports s/p biopsy but unable to find the results of this in EMR at this time. ---Recheck TSH.  Sleep apnea --Reportedly now owns a CPAP. Compliance stressed.  COPD --Likely contributing to symptoms. --Continue inhalers, breathing treatments.   Morbid obesity --Weight loss advised. Suspect element of deconditioning as well.  For questions or updates, please contact [place] Please consult [email or web address] for contact info under        Signed, Arvil Chaco, PA-C  05/21/2019, 7:57 AM

## 2019-05-21 NOTE — Progress Notes (Addendum)
[place] [facility] [place] NAME: Julie Bender    MR#:  [id number]  DATE OF BIRTH:  October 11, 1960  SUBJECTIVE:  CHIEF COMPLAINT:   Chief Complaint  Patient presents with  . Shortness of Breath  . Chest Pain   The patient has better chest pain, mild shortness of breath on exertion. REVIEW OF SYSTEMS:  Review of Systems  Constitutional: Negative for chills, fever and malaise/fatigue.  HENT: Negative for sore throat.   Eyes: Negative for blurred vision and double vision.  Respiratory: Positive for shortness of breath. Negative for cough, hemoptysis, wheezing and stridor.   Cardiovascular: Positive for chest pain. Negative for palpitations, orthopnea and leg swelling.  Gastrointestinal: Negative for abdominal pain, blood in stool, diarrhea, melena, nausea and vomiting.  Genitourinary: Negative for dysuria, flank pain and hematuria.  Musculoskeletal: Negative for back pain and joint pain.  Neurological: Negative for dizziness, sensory change, focal weakness, seizures, loss of consciousness, weakness and headaches.  Endo/Heme/Allergies: Negative for polydipsia.  Psychiatric/Behavioral: Negative for depression. The patient is not nervous/anxious.     DRUG ALLERGIES:  No Known Allergies VITALS:  Blood pressure 132/67, pulse 63, temperature 98.4 F (36.9 C), temperature source Oral, resp. rate 18, height 5\' 4"  (1.626 m), weight 97.8 kg, SpO2 98 %. PHYSICAL EXAMINATION:  Physical Exam Constitutional:      General: She is not in acute distress.    Appearance: Normal appearance. She is obese.  HENT:     Head: Normocephalic.  Eyes:     General: No scleral icterus.    Conjunctiva/sclera: Conjunctivae normal.     Pupils: Pupils are equal, round, and reactive to light.  Neck:     Musculoskeletal: Normal range of motion and neck supple.     Vascular: No JVD.     Trachea: No tracheal deviation.  Cardiovascular:     Rate and Rhythm: Normal rate and  regular rhythm.     Heart sounds: Normal heart sounds. No murmur. No gallop.   Pulmonary:     Effort: Pulmonary effort is normal. No respiratory distress.     Breath sounds: Normal breath sounds. No wheezing or rales.  Chest:     Chest wall: Tenderness present.  Abdominal:     General: Bowel sounds are normal. There is no distension.     Palpations: Abdomen is soft.     Tenderness: There is abdominal tenderness. There is no rebound.  Musculoskeletal: Normal range of motion.        General: No tenderness.     Right lower leg: No edema.     Left lower leg: No edema.  Skin:    Findings: No erythema or rash.  Neurological:     General: No focal deficit present.     Mental Status: She is alert and oriented to person, place, and time.     Cranial Nerves: No cranial nerve deficit.  Psychiatric:        Mood and Affect: Mood normal.    LABORATORY PANEL:  Female CBC Recent Labs  Lab 05/21/19 0845  WBC 7.3  HGB 10.5*  HCT 32.9*  PLT 287   ------------------------------------------------------------------------------------------------------------------ Chemistries  Recent Labs  Lab 05/19/19 0835  NA 137  K 4.5  CL 102  CO2 25  GLUCOSE 213*  BUN 19  CREATININE 1.20*  CALCIUM 8.6*   RADIOLOGY:  No results found. ASSESSMENT AND PLAN:   *Atypical chest pain, Continue aspirin, Coreg and statin.  Nitro PRN.  Echocardiogram: Left ventricular ejection fraction, by visual estimation, is >75%. The left ventricle has hyperdynamic function. There is severely increased left ventricular hypertrophy. Per Dr. Saunders Revel, decrease carvedilol and increase verapamil to see if that helps her breathing, possible cardiac cath tomorros.  *COPD.  Stable.  Nebulizer as needed.  Continue inhalers.  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  Continue patient's long-acting insulin and pre-meal insulin.  Sliding scale insulin ordered.  Diabetic diet.   *Hypertension.  Continue home medications.  Controlled.  *Chronic diastolic CHF.  No signs of fluid overload.  Continue torsemide.  *Gastritis/GERD.  Epigastric pain after eating.  on PPI.  All the records are reviewed and case discussed with Care Management/Social Worker. Management plans discussed with the patient, family and they are in agreement.  CODE STATUS: Full Code  TOTAL TIME TAKING CARE OF THIS PATIENT: 27 minutes.   More than 50% of the time was spent in counseling/coordination of care: YES  POSSIBLE D/C IN 1 DAYS, DEPENDING ON CLINICAL CONDITION.   Demetrios Loll M.D on 05/21/2019 at 2:44 PM  Between 7am to 6pm - Pager - [phone]  After 6pm go to [email or web address] - [facility]

## 2019-05-21 NOTE — Progress Notes (Signed)
*  PRELIMINARY RESULTS* Echocardiogram 2D Echocardiogram has been performed.  Julie Bender Foot 05/21/2019, 9:43 AM

## 2019-05-22 LAB — GLUCOSE, CAPILLARY
Glucose-Capillary: 142 mg/dL — ABNORMAL HIGH (ref 70–99)
Glucose-Capillary: 257 mg/dL — ABNORMAL HIGH (ref 70–99)
Glucose-Capillary: 319 mg/dL — ABNORMAL HIGH (ref 70–99)
Glucose-Capillary: 327 mg/dL — ABNORMAL HIGH (ref 70–99)
Glucose-Capillary: 189 mg/dL — ABNORMAL HIGH (ref 70–99)

## 2019-05-22 LAB — BASIC METABOLIC PANEL
Anion gap: 7 (ref 5–15)
BUN: 19 mg/dL (ref 6–20)
CO2: 24 mmol/L (ref 22–32)
Calcium: 8.7 mg/dL — ABNORMAL LOW (ref 8.9–10.3)
Chloride: 107 mmol/L (ref 98–111)
Creatinine, Ser: 0.83 mg/dL (ref 0.44–1.00)
GFR calc Af Amer: >60 mL/min (ref >60)
GFR calc non Af Amer: >60 mL/min (ref >60)
Glucose, Bld: 190 mg/dL — ABNORMAL HIGH (ref 70–99)
Potassium: 4 mmol/L (ref 3.5–5.1)
Sodium: 138 mmol/L (ref 135–145)

## 2019-05-22 MED ORDER — PANTOPRAZOLE SODIUM 40 MG PO TBEC
40.0000 mg | DELAYED_RELEASE_TABLET | Freq: Two times a day (BID) | ORAL | Status: DC
  Administered 2019-05-22 – 2019-05-26 (×8): 40 mg via ORAL
  Filled 2019-05-22 (×8): qty 1

## 2019-05-22 MED ORDER — TORSEMIDE 20 MG PO TABS
30.0000 mg | ORAL_TABLET | Freq: Two times a day (BID) | ORAL | Status: DC
  Administered 2019-05-22 – 2019-05-23 (×3): 30 mg via ORAL
  Filled 2019-05-22 (×3): qty 2

## 2019-05-22 MED ORDER — ALUM & MAG HYDROXIDE-SIMETH 200-200-20 MG/5ML PO SUSP: 30.0000 mL | Freq: Four times a day (QID) | ORAL | Status: DC | PRN

## 2019-05-22 MED ORDER — ALBUTEROL SULFATE (2.5 MG/3ML) 0.083% IN NEBU
2.5000 mg | INHALATION_SOLUTION | RESPIRATORY_TRACT | Status: DC
  Administered 2019-05-22 – 2019-05-24 (×13): 2.5 mg via RESPIRATORY_TRACT
  Filled 2019-05-22 (×13): qty 3

## 2019-05-22 MED ORDER — CITALOPRAM HYDROBROMIDE 20 MG PO TABS
20.0000 mg | ORAL_TABLET | Freq: Every day | ORAL | Status: DC
  Administered 2019-05-22 – 2019-05-26 (×5): 20 mg via ORAL
  Filled 2019-05-22 (×5): qty 1

## 2019-05-22 MED ORDER — BISACODYL 5 MG PO TBEC
5.0000 mg | DELAYED_RELEASE_TABLET | Freq: Every day | ORAL | Status: DC | PRN
  Administered 2019-05-22 – 2019-05-25 (×5): 5 mg via ORAL
  Filled 2019-05-22 (×5): qty 1

## 2019-05-22 NOTE — Consult Note (Signed)
Pulmonary Medicine          Date: 05/22/2019,   MRN# [id number] Julie Bender [birth year]     AdmissionWeight: 97.1 kg                 CurrentWeight: 99.6 kg      CHIEF COMPLAINT:   Severe shortness of breath   HISTORY OF PRESENT ILLNESS   This is a pleasant 58 year old female with a history of CHF, COPD, diabetes, diabetic retinopathy, history of a cardiac defibrillator, essential hypertension hypertrophic cardiomyopathy, who came into the hospital for shortness of breath and chest discomfort lasting approximately 3 days.  Patient reports that the chest pain was diffuse and associated with worsening shortness of breath and fragmented speech.  On admission she was noted to have mild elevation of high-sensitivity troponin which is consistent with her previous admission for COPD exacerbation.  She had a COVID test which was negative.  She had a transthoracic echo done on this admission which confirmed presence of hypertrophic cardiomyopathy with severe LVH.  Her CBC and BMP are essentially unremarkable except for mild hyperglycemia. She denies fevers, but did have "cold" 2 weeks ago.     PAST MEDICAL HISTORY   Past Medical History:  Diagnosis Date  . CHF (congestive heart failure) ([place])   . COPD (chronic obstructive pulmonary disease) ([place])   . Diabetes mellitus without complication ([place])   . Diabetic retinopathy ([place])   . History of placement of internal cardiac defibrillator   . Hypertension   . Hypertrophic cardiomyopathy ([place])      SURGICAL HISTORY   Past Surgical History:  Procedure Laterality Date  . CARDIAC DEFIBRILLATOR PLACEMENT    . CHOLECYSTECTOMY       FAMILY HISTORY   Family History  Problem Relation Age of Onset  . Hypertrophic cardiomyopathy Sister      SOCIAL HISTORY   Social History   Tobacco Use  . Smoking status: Passive Smoke Exposure - Never Smoker  . Smokeless tobacco: Never Used  Substance Use Topics  . Alcohol use: No  .  Drug use: Never     MEDICATIONS    Home Medication:  Current Outpatient Rx  . Order #: [id number] Class: Print    Current Medication:  Current Facility-Administered Medications:  .  acetaminophen (TYLENOL) tablet 650 mg, 650 mg, Oral, Q6H PRN, 650 mg at 05/20/19 1320 **OR** acetaminophen (TYLENOL) suppository 650 mg, 650 mg, Rectal, Q6H PRN, Sudini, Srikar, MD .  albuterol (PROVENTIL) (2.5 MG/3ML) 0.083% nebulizer solution 2.5 mg, 2.5 mg, Nebulization, Q4H, End, Christopher, MD, 2.5 mg at 05/22/19 1527 .  alum & mag hydroxide-simeth (MAALOX/MYLANTA) 200-200-20 MG/5ML suspension 30 mL, 30 mL, Oral, Q6H PRN, Demetrios Loll, MD .  aspirin EC tablet 81 mg, 81 mg, Oral, Daily, Sudini, Srikar, MD, 81 mg at 05/22/19 0834 .  atorvastatin (LIPITOR) tablet 40 mg, 40 mg, Oral, q1800, Sudini, Srikar, MD, 40 mg at 05/21/19 1810 .  bisacodyl (DULCOLAX) EC tablet 5 mg, 5 mg, Oral, Daily PRN, Demetrios Loll, MD, 5 mg at 05/22/19 0848 .  carvedilol (COREG) tablet 3.125 mg, 3.125 mg, Oral, BID WC, End, Christopher, MD, 3.125 mg at 05/22/19 0834 .  citalopram (CELEXA) tablet 20 mg, 20 mg, Oral, Daily, Demetrios Loll, MD, 20 mg at 05/22/19 1247 .  enoxaparin (LOVENOX) injection 40 mg, 40 mg, Subcutaneous, Q24H, Sudini, Srikar, MD, 40 mg at 05/21/19 2131 .  gabapentin (NEURONTIN) capsule 300 mg, 300 mg, Oral, BID, Sudini, Srikar, MD, 300 mg at  05/22/19 0834 .  insulin aspart (novoLOG) injection 0-15 Units, 0-15 Units, Subcutaneous, TID WC, Hillary Bow, MD, 8 Units at 05/22/19 1146 .  insulin aspart (novoLOG) injection 0-5 Units, 0-5 Units, Subcutaneous, QHS, Hillary Bow, MD, 2 Units at 05/20/19 2124 .  insulin aspart (novoLOG) injection 7 Units, 7 Units, Subcutaneous, TID WC, Sudini, Srikar, MD, 7 Units at 05/22/19 1146 .  insulin detemir (LEVEMIR) injection 42 Units, 42 Units, Subcutaneous, QHS, Hillary Bow, MD, 42 Units at 05/21/19 2132 .  nitroGLYCERIN (NITROSTAT) SL tablet 0.4 mg, 0.4 mg, Sublingual, Q5 min  PRN, Sudini, Srikar, MD .  ondansetron (ZOFRAN) tablet 4 mg, 4 mg, Oral, Q6H PRN **OR** ondansetron (ZOFRAN) injection 4 mg, 4 mg, Intravenous, Q6H PRN, Sudini, Srikar, MD .  oxyCODONE-acetaminophen (PERCOCET/ROXICET) 5-325 MG per tablet 1 tablet, 1 tablet, Oral, Q6H PRN, Hillary Bow, MD, 1 tablet at 05/21/19 2007 .  pantoprazole (PROTONIX) EC tablet 40 mg, 40 mg, Oral, BID AC, Demetrios Loll, MD .  polyethylene glycol (MIRALAX / GLYCOLAX) packet 17 g, 17 g, Oral, Daily PRN, Hillary Bow, MD, 17 g at 05/21/19 2011 .  sodium chloride flush (NS) 0.9 % injection 3 mL, 3 mL, Intravenous, Q12H, Sudini, Srikar, MD, 3 mL at 05/22/19 0834 .  torsemide (DEMADEX) tablet 30 mg, 30 mg, Oral, BID, End, Christopher, MD .  traMADol Veatrice Bourbon) tablet 50 mg, 50 mg, Oral, Q6H PRN, Hillary Bow, MD, 50 mg at 05/20/19 2122 .  traZODone (DESYREL) tablet 150 mg, 150 mg, Oral, QHS, Sudini, Srikar, MD, 150 mg at 05/21/19 2131 .  verapamil (CALAN-SR) CR tablet 240 mg, 240 mg, Oral, Daily, End, Christopher, MD, 240 mg at 05/22/19 [id number]    ALLERGIES   Patient has no known allergies.     REVIEW OF SYSTEMS    Review of Systems:  Gen:  Denies  fever, sweats, chills weigh loss  HEENT: Denies blurred vision, double vision, ear pain, eye pain, hearing loss, nose bleeds, sore throat Cardiac:  No dizziness, chest pain or heaviness, chest tightness,edema Resp:   Denies cough or sputum porduction, shortness of breath,wheezing, hemoptysis,  Gi: Denies swallowing difficulty, stomach pain, nausea or vomiting, diarrhea, constipation, bowel incontinence Gu:  Denies bladder incontinence, burning urine Ext:   Denies Joint pain, stiffness or swelling Skin: Denies  skin rash, easy bruising or bleeding or hives Endoc:  Denies polyuria, polydipsia , polyphagia or weight change Psych:   Denies depression, insomnia or hallucinations   Other:  All other systems negative   VS: BP 126/65 (BP Location: Right Arm)   Pulse 60    Temp (!) 97.5 F (36.4 C) (Oral)   Resp 19   Ht 5\' 4"  (1.626 m)   Wt 99.6 kg   SpO2 95%   BMI 37.69 kg/m      PHYSICAL EXAM    GENERAL:NAD, no fevers, chills, no weakness no fatigue HEAD: Normocephalic, atraumatic.  EYES: Pupils equal, round, reactive to light. Extraocular muscles intact. No scleral icterus.  MOUTH: Moist mucosal membrane. Dentition intact. No abscess noted.  EAR, NOSE, THROAT: Clear without exudates. No external lesions.  NECK: Supple. No thyromegaly. No nodules. No JVD.  PULMONARY: mild rhonchi with no crackles CARDIOVASCULAR: S1 and S2. Regular rate and rhythm. No murmurs, rubs, or gallops. No edema. Pedal pulses 2+ bilaterally.  GASTROINTESTINAL: Soft, nontender, nondistended. No masses. Positive bowel sounds. No hepatosplenomegaly.  MUSCULOSKELETAL: No swelling, clubbing, or edema. Range of motion full in all extremities.  NEUROLOGIC: Cranial nerves II through XII are intact. No  gross focal neurological deficits. Sensation intact. Reflexes intact.  SKIN: No ulceration, lesions, rashes, or cyanosis. Skin warm and dry. Turgor intact.  PSYCHIATRIC: Mood, affect within normal limits. The patient is awake, alert and oriented x 3. Insight, judgment intact.       IMAGING    Dg Chest 2 View  Result Date: 05/19/2019 CLINICAL DATA:  Bilateral lower extremity pain. Shortness of breath. EXAM: CHEST - 2 VIEW COMPARISON:  02/23/2019 FINDINGS: There is mild bilateral interstitial thickening. There is no focal consolidation. There is no pleural effusion or pneumothorax. There is stable cardiomegaly. There is a dual lead cardiac pacemaker. There is no acute osseous abnormality. IMPRESSION: Cardiomegaly with mild pulmonary vascular congestion. Electronically Signed   By: Kathreen Devoid   On: 05/19/2019 09:22   Ct Angio Chest Pe W And/or Wo Contrast  Result Date: 05/19/2019 CLINICAL DATA:  Bilateral lower extremity pain and shortness of breath. EXAM: CT ANGIOGRAPHY CHEST CT  ABDOMEN AND PELVIS WITH CONTRAST TECHNIQUE: Multidetector CT imaging of the chest was performed using the standard protocol during bolus administration of intravenous contrast. Multiplanar CT image reconstructions and MIPs were obtained to evaluate the vascular anatomy. Multidetector CT imaging of the abdomen and pelvis was performed using the standard protocol during bolus administration of intravenous contrast. CONTRAST:  141mL OMNIPAQUE IOHEXOL 350 MG/ML SOLN COMPARISON:  CT dated 04/18/2018 FINDINGS: CTA CHEST FINDINGS Cardiovascular: Contrast injection is sufficient to demonstrate satisfactory opacification of the pulmonary arteries to the segmental level. There is no pulmonary embolus. The main pulmonary artery is dilated measuring approximately 3.8 cm in diameter. There is no CT evidence of acute right heart strain. There are mild atherosclerotic changes of the visualized thoracic aorta. Heart size is enlarged. There is significant wall thickening of the left ventricle. 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 There is no significant pericardial effusion. Mediastinum/Nodes: --No mediastinal or hilar lymphadenopathy. --No axillary lymphadenopathy. --No supraclavicular lymphadenopathy. --a large left-sided thyroid nodule is again noted. --The esophagus is unremarkable Lungs/Pleura: The lung volumes are low. The trachea is unremarkable. There is a trace right-sided pleural effusion. There is no pneumothorax or focal infiltrate. There is a small amount of atelectasis in the lingula. Musculoskeletal: No chest wall abnormality. No acute or significant osseous findings. Review of the MIP images confirms the above findings. CT ABDOMEN and PELVIS FINDINGS Hepatobiliary: The liver is normal. Status post cholecystectomy.There is no biliary ductal dilation. Pancreas: Normal contours without ductal dilatation. No peripancreatic fluid collection. Spleen: No splenic laceration or hematoma. Adrenals/Urinary Tract: --Adrenal  glands: No adrenal hemorrhage. --Right kidney/ureter: No hydronephrosis or perinephric hematoma. --Left kidney/ureter: No hydronephrosis or perinephric hematoma. --Urinary bladder: Unremarkable. Stomach/Bowel: --Stomach/Duodenum: No hiatal hernia or other gastric abnormality. Normal duodenal course and caliber. --Small bowel: No dilatation or inflammation. --Colon: No focal abnormality. --Appendix: Normal. Vascular/Lymphatic: Atherosclerotic calcification is present within the non-aneurysmal abdominal aorta, without hemodynamically significant stenosis. --No retroperitoneal lymphadenopathy. --No mesenteric lymphadenopathy. --No pelvic or inguinal lymphadenopathy. Reproductive: Unremarkable Other: There is a partially visualized 1.7 x 1.2 cm subcutaneous nodule in the left gluteal fold. The abdominal wall is normal. Musculoskeletal. No acute displaced fractures. Review of the MIP images confirms the above findings. IMPRESSION: 1. No acute thoracic, abdominal or pelvic injury. 2. Cardiomegaly with significant wall thickening of the left ventricle. 3. Trace right-sided pleural effusion. 4. Dilated main pulmonary artery which can be seen in patients with elevated pulmonary artery pressures. 5. Partially visualized 1.7 x 1.2 cm subcutaneous nodule in the left gluteal fold. Recommend correlation  with physical examination. This could represent a sebaceous cyst. Aortic Atherosclerosis (ICD10-I70.0). Electronically Signed   By: Constance Holster M.D.   On: 05/19/2019 11:06   Ct Abdomen Pelvis W Contrast  Result Date: 05/19/2019 CLINICAL DATA:  Bilateral lower extremity pain and shortness of breath. EXAM: CT ANGIOGRAPHY CHEST CT ABDOMEN AND PELVIS WITH CONTRAST TECHNIQUE: Multidetector CT imaging of the chest was performed using the standard protocol during bolus administration of intravenous contrast. Multiplanar CT image reconstructions and MIPs were obtained to evaluate the vascular anatomy. Multidetector CT imaging  of the abdomen and pelvis was performed using the standard protocol during bolus administration of intravenous contrast. CONTRAST:  152mL OMNIPAQUE IOHEXOL 350 MG/ML SOLN COMPARISON:  CT dated 04/18/2018 FINDINGS: CTA CHEST FINDINGS Cardiovascular: Contrast injection is sufficient to demonstrate satisfactory opacification of the pulmonary arteries to the segmental level. There is no pulmonary embolus. The main pulmonary artery is dilated measuring approximately 3.8 cm in diameter. There is no CT evidence of acute right heart strain. There are mild atherosclerotic changes of the visualized thoracic aorta. Heart size is enlarged. There is significant wall thickening of the left ventricle. 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 There is no significant pericardial effusion. Mediastinum/Nodes: --No mediastinal or hilar lymphadenopathy. --No axillary lymphadenopathy. --No supraclavicular lymphadenopathy. --a large left-sided thyroid nodule is again noted. --The esophagus is unremarkable Lungs/Pleura: The lung volumes are low. The trachea is unremarkable. There is a trace right-sided pleural effusion. There is no pneumothorax or focal infiltrate. There is a small amount of atelectasis in the lingula. Musculoskeletal: No chest wall abnormality. No acute or significant osseous findings. Review of the MIP images confirms the above findings. CT ABDOMEN and PELVIS FINDINGS Hepatobiliary: The liver is normal. Status post cholecystectomy.There is no biliary ductal dilation. Pancreas: Normal contours without ductal dilatation. No peripancreatic fluid collection. Spleen: No splenic laceration or hematoma. Adrenals/Urinary Tract: --Adrenal glands: No adrenal hemorrhage. --Right kidney/ureter: No hydronephrosis or perinephric hematoma. --Left kidney/ureter: No hydronephrosis or perinephric hematoma. --Urinary bladder: Unremarkable. Stomach/Bowel: --Stomach/Duodenum: No hiatal hernia or other gastric abnormality. Normal duodenal course  and caliber. --Small bowel: No dilatation or inflammation. --Colon: No focal abnormality. --Appendix: Normal. Vascular/Lymphatic: Atherosclerotic calcification is present within the non-aneurysmal abdominal aorta, without hemodynamically significant stenosis. --No retroperitoneal lymphadenopathy. --No mesenteric lymphadenopathy. --No pelvic or inguinal lymphadenopathy. Reproductive: Unremarkable Other: There is a partially visualized 1.7 x 1.2 cm subcutaneous nodule in the left gluteal fold. The abdominal wall is normal. Musculoskeletal. No acute displaced fractures. Review of the MIP images confirms the above findings. IMPRESSION: 1. No acute thoracic, abdominal or pelvic injury. 2. Cardiomegaly with significant wall thickening of the left ventricle. 3. Trace right-sided pleural effusion. 4. Dilated main pulmonary artery which can be seen in patients with elevated pulmonary artery pressures. 5. Partially visualized 1.7 x 1.2 cm subcutaneous nodule in the left gluteal fold. Recommend correlation with physical examination. This could represent a sebaceous cyst. Aortic Atherosclerosis (ICD10-I70.0). Electronically Signed   By: Constance Holster M.D.   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  Mild sleep apnea with an AHI of 7.1.  CPAP titration study was recommended as well as  work-up for periodic limb movement disorder. **CPAP titration study 12/05/2017>> PLM index 65, PLM arousal index of 2.  Hypopnea index remained elevated above 10 and all pressures until a pressure of 10 was achieved and AHI was 0. CPAP of 10 was recommended.  -patient did not wear consistently.        Thank you for allowing me to participate in the care of this patient.    Patient/Family are satisfied with care plan and all questions have been answered.  This document was prepared using Dragon voice recognition software and may include unintentional dictation errors.     Ottie Glazier, M.D.  Division of [place]

## 2019-05-22 NOTE — Progress Notes (Signed)
Progress Note  Patient Name: Julie Bender Date of Encounter: 05/22/2019  Primary Cardiologist: [facility] [Previoiusly followed at [facility], now Dr. Rockey Situ at [place]   Patient notes progressive shortness of breath and dyspnea on exertion / wheezing.  Expressed anxiety and concern regarding her current SOB/DOE severely limiting her ability to complete her daily living activities, such as ambulating to the restroom or taking a shower. She reportedly experiences this shortness of breath and dyspnea on exertion once every few months, which causes her significant anxiety.  Uncertain if she is unable to lay flat for at least one hour today if cath performed, due to her current breathing status.  She stated that she has not yet had any breathing treatments this admission.  She reportedly uses an inhaler at home and noted that it was less effective leading up to this current admission.    No current CP. She denied chest pain at rest or with ambulation.  No reported racing heart rate or skipping beats.  Her main complaint is of shortness of breath and dyspnea on exertion.  Inpatient Medications    Scheduled Meds: . albuterol  2.5 mg Nebulization Q4H  . aspirin EC  81 mg Oral Daily  . atorvastatin  40 mg Oral q1800  . carvedilol  3.125 mg Oral BID WC  . enoxaparin (LOVENOX) injection  40 mg Subcutaneous Q24H  . gabapentin  300 mg Oral BID  . insulin aspart  0-15 Units Subcutaneous TID WC  . insulin aspart  0-5 Units Subcutaneous QHS  . insulin aspart  7 Units Subcutaneous TID WC  . insulin detemir  42 Units Subcutaneous QHS  . pantoprazole  40 mg Oral Daily  . sodium chloride flush  3 mL Intravenous Q12H  . torsemide  30 mg Oral Daily  . traZODone  150 mg Oral QHS  . verapamil  240 mg Oral Daily   Continuous Infusions:  PRN Meds: acetaminophen **OR** acetaminophen, bisacodyl, nitroGLYCERIN, ondansetron **OR** ondansetron (ZOFRAN) IV, oxyCODONE-acetaminophen, polyethylene  glycol, traMADol   Vital Signs    Vitals:   05/21/19 1556 05/21/19 2030 05/22/19 0320 05/22/19 0729  BP: (!) 143/79 (!) 145/84 (!) 148/83 (!) 155/69  Pulse: (!) 55 61 65 (!) 55  Resp: 19  14 19   Temp: 97.6 F (36.4 C) 97.6 F (36.4 C) 98.7 F (37.1 C) 97.6 F (36.4 C)  TempSrc: Oral Oral Oral Oral  SpO2: 96% 95% 94% 94%  Weight:   99.6 kg   Height:        Intake/Output Summary (Last 24 hours) at 05/22/2019 0856 Last data filed at 05/22/2019 0835 Gross per 24 hour  Intake 480 ml  Output 4550 ml  Net -4070 ml   Last 3 Weights 05/22/2019 05/21/2019 05/20/2019  Weight (lbs) 219 lb 9.6 oz 215 lb 8.4 oz 216 lb 12.8 oz  Weight (kg) 99.61 kg 97.76 kg 98.34 kg      Telemetry    SR to SB, atrial paced, ventricular sensed, 1 episode of ventricular pacing noted, 1 episode of transient AV block noted, short atrial run of 11 beats- Personally Reviewed  ECG    No new tracings - Personally Reviewed  Physical Exam   GEN: No acute distress.  Obese female, lying in bed.   Neck: Difficult to assess due to body habitus.   Cardiac:  Faint heart sounds, bradycardic but regular, 1/6 holosystolic murmur; no rubs or gallops.  Respiratory:  Distant breath sounds, no pulmonary wheezing noted on posterior  ausculation of lung fields GI: Obese, nontender, non-distended  MS: Trivial bilateral lower extremity edema; No deformity. Neuro:  Nonfocal  Psych: Normal affect   Labs    High Sensitivity Troponin:   Recent Labs  Lab 05/19/19 0835 05/19/19 1146  TROPONINIHS 90* 82*      Cardiac EnzymesNo results for input(s): TROPONINI in the last 168 hours. No results for input(s): TROPIPOC in the last 168 hours.   Chemistry Recent Labs  Lab 05/19/19 0835 05/22/19 0513  NA 137 138  K 4.5 4.0  CL 102 107  CO2 25 24  GLUCOSE 213* 190*  BUN 19 19  CREATININE 1.20* 0.83  CALCIUM 8.6* 8.7*  GFRNONAA 49* >60  GFRAA 57* >60  ANIONGAP 10 7     Hematology Recent Labs  Lab 05/19/19 0835  05/21/19 0845  WBC 7.2 7.3  RBC 3.79* 3.92  HGB 10.1* 10.5*  HCT 31.9* 32.9*  MCV 84.2 83.9  MCH 26.6 26.8  MCHC 31.7 31.9  RDW 15.6* 15.8*  PLT 253 287    BNP Recent Labs  Lab 05/19/19 0835  BNP 463.0*     DDimer No results for input(s): DDIMER in the last 168 hours.   Radiology    No results found.  Cardiac Studies   Echo 05/22/2019  1. Left ventricular ejection fraction, by visual estimation, is >75%. The left ventricle has hyperdynamic function. There is severely increased left ventricular hypertrophy.  2. Definity contrast agent was given IV to delineate the left ventricular endocardial borders.  3. Elevated mean left atrial pressure.  4. Hypertrophic cardiomyopathy.  5. Left ventricular diastolic Doppler parameters are consistent with pseudonormalization pattern of LV diastolic filling.  6. Global right ventricle was not well visualized.The right ventricular size is not well visualized. No increase in right ventricular wall thickness.  7. Left atrial size was mildly dilated.  8. Right atrial size was not well visualized.  9. The pericardium was not well visualized. 10. Mild aortic valve annular calcification. 11. The mitral valve is degenerative. Mild mitral valve regurgitation. 12. The tricuspid valve is not well visualized. Tricuspid valve regurgitation was not visualized by color flow Doppler. 13. The aortic valve was not well visualized Aortic valve regurgitation is trivial by color flow Doppler. Mild aortic valve sclerosis without stenosis. 14. The pulmonic valve was not well visualized. Pulmonic valve regurgitation is not visualized by color flow Doppler. 15. The inferior vena cava is normal in size with greater than 50% respiratory variability, suggesting right atrial pressure of 3 mmHg. 16. The interatrial septum was not well visualized.   Patient Profile     58 y.o. female with a history of HOCM s/p ICD followed at [facility], nonobstructive CAD by [facility] 03/2016,  HFpEF, hypertensive heart disease, mild to moderate MR, thyroid nodule, vitamin B12 deficiency, IDDM, HTN, obesity, COPD, and sleep apnea who is being seen today for chest pain.  Assessment & Plan    Atypical Chest pain, supply demand ischemia History of nonobstructive CAD by [facility] 03/2016 HOCM s/p ICD --No current CP reported; however, c/o progressive DOE/SOB.  --HS troponin minimally elevated, flat trending, and not consistent with ACS. No acute changes on initial EKG.  --Tentative plan for R/L cardiac catheterization given her history of HOCM and persistent / progressive symptoms to better assess hemodynamics.  --Cannot lay flat today due to breathing status; therefore, cath deferred to tomorrow. Optimize medical therapy today with diet restarted. Given ICD, can escalate BB despite bradycardic rates. Frequent breathing treatments / nebulizer  recommended. NPO after midnight.  --Aggressive risk factor modification recommended.  --Consider referral to advanced heart failure clinic in [place] after discharge.   HFpEF (EF 55-60%) Pulmonary Hypertension HOCM s/p ICD --Persistent and progressive SOB/DOE. Suspect sx 2/2 HOCM / HFpEF / pulmonary hypertension. Most recent echo as above.  --Volume status difficult to ascertain due to body habitus; however, does not appear volume up today. Tentative plan for cardiac cath tomorrow as above and if able to lay flat.  --Continue diuresis with caution, given LVOT gradient.  --Continue to monitor I/Os, daily weights. Daily BMET.   HTN --Currently well controlled.  --Caution with diuresis given LVOT gradient as above.  IDDM with neuropathy --History documented of upper and lower extremity paresthesias --05/19/2019 A1C 7.2. --Per IM  Thyroid nodule, 3.6cm --TSH checked  Sleep apnea --Owns a CPAP. Compliance stressed.  COPD --Likely contributing to symptoms. --Continue inhalers, breathing treatments.   Morbid obesity --Weight loss advised.  Suspect element of deconditioning as well.  For questions or updates, please contact [place] Please consult [email or web address] for contact info under     Signed, Arvil Chaco, PA-C  05/22/2019, 8:56 AM      I have independently seen and examined the patient and agree with the findings and plan, as documented in the PA/NP's note, with the following additions/changes.  Patient continues to complain of significant shortness of breath, particularly when walking to the bathroom.  She is able to lie flat for brief periods of time when she has been able to rest and catch her breath.  She wheezes easily with activity but has not received/requested nebulizers.  She denies chest pain and palpitations.  Exam notable for obese woman lying in bed.  Heart sounds are distant but regular with 2/6 systolic murmur.  Breath sounds are diminished throughout without wheezes (upper airway wheezing audible when patient walked to the bathroom).  No LE edema.  Unable to assess JVP due to body habitus.  Tele shows predominantly a-paced, v-sensed rhythm.  2 brief atrial runs lasting up to 7 seconds noted.  Labs notable for K 4.0 and creatinine 0.8.  I believe that Ms. Hudgins's shortness of breath (present for >2 years) is multifactorial, including HCM, possible obstructive lung disease and/or vocal cord dysfunction, obesity, and deconditioning.  She does not appear significantly volume overloaded, though exam is challenging due to body habitus.  Weight continues to climb despite I & O's suggesting that she is net negative > 8L during this admission.  Catheterization at [facility] in 2017 suggested only a modest LVOT gradient.  I suspect that low cardiac output and pulmonary venous hypertension in the setting of significant diastolic dysfunction related to HCM may be contributing.  We will see if decreased dose of carvedilol and increased dose of verapamil helps her feel any better.  I will increase her torsemide to 30 mg BID  and also schedule albuterol nebs Q4 hours.  Ultimately, repeat R/LHC may be necessary to reassess LVOT gradient and filling pressures.  I doubt that significant CAD is causing her symptoms, given minimal disease noted on cath in 2017.  Consultation with pulmonary regarding possible obstructive lung disease and/or upper airway dysfunction (e.g. vocal cord dysfunction) may be helpful.  PT consultation is also recommended.  Nelva Bush, MD [facility] Pager: [phone]

## 2019-05-22 NOTE — Progress Notes (Signed)
[place] [facility] [place] NAME: Julie Bender    MR#:  [id number]  DATE OF BIRTH:  19-May-1961  SUBJECTIVE:  CHIEF COMPLAINT:   Chief Complaint  Patient presents with  . Shortness of Breath  . Chest Pain   The patient has better chest pain, mild shortness of breath on exertion. REVIEW OF SYSTEMS:  Review of Systems  Constitutional: Negative for chills, fever and malaise/fatigue.  HENT: Negative for sore throat.   Eyes: Negative for blurred vision and double vision.  Respiratory: Positive for shortness of breath. Negative for cough, hemoptysis, wheezing and stridor.   Cardiovascular: Positive for chest pain. Negative for palpitations, orthopnea and leg swelling.  Gastrointestinal: Negative for abdominal pain, blood in stool, diarrhea, melena, nausea and vomiting.  Genitourinary: Negative for dysuria, flank pain and hematuria.  Musculoskeletal: Negative for back pain and joint pain.  Neurological: Negative for dizziness, sensory change, focal weakness, seizures, loss of consciousness, weakness and headaches.  Endo/Heme/Allergies: Negative for polydipsia.  Psychiatric/Behavioral: Negative for depression. The patient is not nervous/anxious.     DRUG ALLERGIES:  No Known Allergies VITALS:  Blood pressure (!) 155/69, pulse (!) 55, temperature 97.6 F (36.4 C), temperature source Oral, resp. rate 19, height 5\' 4"  (1.626 m), weight 99.6 kg, SpO2 94 %. PHYSICAL EXAMINATION:  Physical Exam Constitutional:      General: She is not in acute distress.    Appearance: Normal appearance. She is obese.  HENT:     Head: Normocephalic.  Eyes:     General: No scleral icterus.    Conjunctiva/sclera: Conjunctivae normal.     Pupils: Pupils are equal, round, and reactive to light.  Neck:     Musculoskeletal: Normal range of motion and neck supple.     Vascular: No JVD.     Trachea: No tracheal deviation.  Cardiovascular:     Rate and Rhythm: Normal  rate and regular rhythm.     Heart sounds: Normal heart sounds. No murmur. No gallop.   Pulmonary:     Effort: Pulmonary effort is normal. No respiratory distress.     Breath sounds: Normal breath sounds. No wheezing or rales.  Chest:     Chest wall: Tenderness present.  Abdominal:     General: Bowel sounds are normal. There is no distension.     Palpations: Abdomen is soft.     Tenderness: There is abdominal tenderness. There is no rebound.  Musculoskeletal: Normal range of motion.        General: No tenderness.     Right lower leg: No edema.     Left lower leg: No edema.  Skin:    Findings: No erythema or rash.  Neurological:     General: No focal deficit present.     Mental Status: She is alert and oriented to person, place, and time.     Cranial Nerves: No cranial nerve deficit.  Psychiatric:        Mood and Affect: Mood normal.    LABORATORY PANEL:  Female CBC Recent Labs  Lab 05/21/19 0845  WBC 7.3  HGB 10.5*  HCT 32.9*  PLT 287   ------------------------------------------------------------------------------------------------------------------ Chemistries  Recent Labs  Lab 05/22/19 0513  NA 138  K 4.0  CL 107  CO2 24  GLUCOSE 190*  BUN 19  CREATININE 0.83  CALCIUM 8.7*   RADIOLOGY:  No results found. ASSESSMENT AND PLAN:   *Atypical chest pain and shortness of breath on exertion, Continue aspirin,  Coreg and statin.  Nitro PRN.   Echocardiogram: Left ventricular ejection fraction, by visual estimation, is >75%. The left ventricle has hyperdynamic function. There is severely increased left ventricular hypertrophy. Per Dr. Saunders Revel, increase her torsemide to 30 mg BID and also schedule albuterol nebs Q4 hour; decreased carvedilol and increased verapamil.  Consultation with pulmonary regarding possible obstructive lung disease and/or upper airway dysfunction (e.g. vocal cord dysfunction).  *COPD.  Stable.  Nebulizer as needed.  Continue inhalers.  *Diabetes  mellitus with complications of diabetic neuropathy and retinopathy.  Continue patient's long-acting insulin and pre-meal insulin.  Sliding scale insulin ordered.  Diabetic diet.  *Hypertension.  Continue home medications.  Controlled.  *Chronic diastolic CHF.  No signs of fluid overload.  Continue torsemide.  *Gastritis/GERD.  Epigastric pain after eating.  on PPI.  All the records are reviewed and case discussed with Care Management/Social Worker. Management plans discussed with the patient, family and they are in agreement.  CODE STATUS: Full Code  TOTAL TIME TAKING CARE OF THIS PATIENT: 26 minutes.   More than 50% of the time was spent in counseling/coordination of care: YES  POSSIBLE D/C IN 1-2 DAYS, DEPENDING ON CLINICAL CONDITION.   Demetrios Loll M.D on 05/22/2019 at 12:25 PM  Between 7am to 6pm - Pager - [phone]  After 6pm go to [email or web address] - [facility]

## 2019-05-22 NOTE — Progress Notes (Signed)
Physical Therapy Treatment Patient Details Name: Franky Debarge MRN: [id number] DOB: 02-24-1961 Today's Date: 05/22/2019    History of Present Illness Dominigue Kenison is a 58 y.o. female who presented to the hospital ED on 05/19/2019 with complaints of shortness of breath and L chest pain nas well as bilateral LE and tingling and mubness in hands and feet which is chronic. Patient addmitted with diagnosis of chest pain. Cardiology consult reported troponin chronically elevated.  Relevant PMH includes COPD, HTN, DMII, hypertrophic cardiomyopathy, diastolic CHF, history of heart catheterization.    PT Comments    Attempted this am at 9:33.  Pt sitting EOB with c/o SOB after independent transfer to commode at bedside.  Stated she is transferring to commode without assist but is unable to walk to the bathroom due to fatigue and dizziness.  Sats 89-93% on room air.  She wants to return to supine to rest and finish breakfast.  Returned later in am and she is agreeable but does not want any OOB activity.  She makes no attempt to help uncover the blankets to allow for supine ex.  She participates in minimal supine ex as below but takes several self initiated rest breaks during each set in order to complete them.   HR and O2 monitored and sats 89-95%.  HR varies 58-72% decreasing at times.  She stated SOB and general fatigue limit her ability to participate.  Pt is not progressing wit gait at this time but is independent in transfers.  Will recommend wheelcahir at discharge as she is unable to ambulate household distances and in community would have difficulty.  At this time will continue with current recommendations but may need to consider SNF if she declines.   Patient suffers from COPD/CHF which impairs her ability to perform daily activities like toileting, feeding, dressing, grooming, bathing in the home. A cane, walker, crutch will not resolve the patient's issue with performing activities of daily  living. A lightweight wheelchair is required/recommended and will allow patient to safely perform daily activities.   Patient can safely propel the wheelchair in the home or has a caregiver who can provide assistance.    Follow Up Recommendations  Supervision for mobility/OOB;Home health PT     Equipment Recommendations  Rolling walker with 5" wheels;3in1 (PT);Wheelchair (measurements PT);Wheelchair cushion (measurements PT)    Recommendations for Other Services       Precautions / Restrictions Precautions Precaution Comments: blind in L eye Restrictions Weight Bearing Restrictions: No Other Position/Activity Restrictions: check orthostatics          Cognition Arousal/Alertness: Awake/alert Behavior During Therapy: WFL for tasks assessed/performed Overall Cognitive Status: Within Functional Limits for tasks assessed                                        Exercises Other Exercises Other Exercises: supine ankle pumps, SLR,heel slides x 10 with extended self initiated rest breaks during sets to complete reps.    General Comments        Pertinent Vitals/Pain Pain Assessment: No/denies pain     PT Goals (current goals can now be found in the care plan section) Progress towards PT goals: Not progressing toward goals - comment    Frequency    Min 2X/week      PT Plan Current plan remains appropriate       AM-PAC PT "6 Clicks" Mobility   Outcome Measure  Help needed turning from your back to your side while in a flat bed without using bedrails?: A Little Help needed moving from lying on your back to sitting on the side of a flat bed without using bedrails?: A Little Help needed moving to and from a bed to a chair (including a wheelchair)?: A Little Help needed standing up from a chair using your arms (e.g., wheelchair or bedside chair)?: A Little Help needed to walk in hospital room?: A Lot Help needed climbing 3-5 steps with a railing? : A  Lot 6 Click Score: 16    End of Session     Patient left: in bed;with call bell/phone within reach Nurse Communication: Other (comment)       Time: [id number] PT Time Calculation (min) (ACUTE ONLY): 8 min  Charges:  $Therapeutic Exercise: 8-22 mins                     Chesley Noon, PTA 05/22/19, 11:19 AM

## 2019-05-23 DIAGNOSIS — R0600 Dyspnea, unspecified: Secondary | ICD-10-CM

## 2019-05-23 LAB — GLUCOSE, CAPILLARY
Glucose-Capillary: 140 mg/dL — ABNORMAL HIGH (ref 70–99)
Glucose-Capillary: 227 mg/dL — ABNORMAL HIGH (ref 70–99)
Glucose-Capillary: 200 mg/dL — ABNORMAL HIGH (ref 70–99)
Glucose-Capillary: 259 mg/dL — ABNORMAL HIGH (ref 70–99)

## 2019-05-23 LAB — RESPIRATORY PANEL BY PCR

## 2019-05-23 LAB — BASIC METABOLIC PANEL
Anion gap: 9 (ref 5–15)
BUN: 21 mg/dL — ABNORMAL HIGH (ref 6–20)
CO2: 25 mmol/L (ref 22–32)
Calcium: 8.8 mg/dL — ABNORMAL LOW (ref 8.9–10.3)
Chloride: 104 mmol/L (ref 98–111)
Creatinine, Ser: 0.91 mg/dL (ref 0.44–1.00)
GFR calc Af Amer: >60 mL/min (ref >60)
GFR calc non Af Amer: >60 mL/min (ref >60)
Glucose, Bld: 156 mg/dL — ABNORMAL HIGH (ref 70–99)
Potassium: 3.8 mmol/L (ref 3.5–5.1)
Sodium: 138 mmol/L (ref 135–145)

## 2019-05-23 LAB — CBC
HCT: 32.6 % — ABNORMAL LOW (ref 36.0–46.0)
Hemoglobin: 10.5 g/dL — ABNORMAL LOW (ref 12.0–15.0)
MCH: 26.8 pg (ref 26.0–34.0)
MCHC: 32.2 g/dL (ref 30.0–36.0)
MCV: 83.2 fL (ref 80.0–100.0)
Platelets: 304 10*3/uL (ref 150–400)
RBC: 3.92 MIL/uL (ref 3.87–5.11)
RDW: 15.6 % — ABNORMAL HIGH (ref 11.5–15.5)
WBC: 7.2 10*3/uL (ref 4.0–10.5)
nRBC: 0 % (ref 0.0–0.2)

## 2019-05-23 MED ORDER — METOPROLOL SUCCINATE ER 25 MG PO TB24
25.0000 mg | ORAL_TABLET | Freq: Every day | ORAL | Status: DC
  Administered 2019-05-23 – 2019-05-26 (×4): 25 mg via ORAL
  Filled 2019-05-23 (×4): qty 1

## 2019-05-23 MED ORDER — FUROSEMIDE 10 MG/ML IJ SOLN
40.0000 mg | Freq: Once | INTRAMUSCULAR | Status: AC
  Administered 2019-05-23: 40 mg via INTRAVENOUS
  Filled 2019-05-23: qty 4

## 2019-05-23 MED ORDER — FUROSEMIDE 10 MG/ML IJ SOLN
60.0000 mg | Freq: Every day | INTRAMUSCULAR | Status: DC
  Administered 2019-05-23: 60 mg via INTRAVENOUS
  Filled 2019-05-23: qty 6

## 2019-05-23 MED ORDER — ALBUMIN HUMAN 25 % IV SOLN
12.5000 g | Freq: Once | INTRAVENOUS | Status: AC
  Administered 2019-05-23: 12.5 g via INTRAVENOUS
  Filled 2019-05-23: qty 50

## 2019-05-23 MED ORDER — BUDESONIDE 0.25 MG/2ML IN SUSP
0.2500 mg | Freq: Two times a day (BID) | RESPIRATORY_TRACT | Status: DC
  Administered 2019-05-23 – 2019-05-26 (×6): 0.25 mg via RESPIRATORY_TRACT
  Filled 2019-05-23 (×6): qty 2

## 2019-05-23 NOTE — Progress Notes (Signed)
Progress Note  Patient Name: Julie Bender Date of Encounter: 05/23/2019  Primary Cardiologist: new to [facility] (previously followed by [facility])  Subjective   Nebs have helped with wheezing, though exertional dyspnea persists. No chest pain. Negative 2/3 L for the past 24 hours with a net - 11.1 L for the admission. Weight trend over the past 24 hours of 99.6-->99.3 kg. Renal function relatively stable.   Inpatient Medications    Scheduled Meds: . albuterol  2.5 mg Nebulization Q4H  . aspirin EC  81 mg Oral Daily  . atorvastatin  40 mg Oral q1800  . carvedilol  3.125 mg Oral BID WC  . citalopram  20 mg Oral Daily  . enoxaparin (LOVENOX) injection  40 mg Subcutaneous Q24H  . gabapentin  300 mg Oral BID  . insulin aspart  0-15 Units Subcutaneous TID WC  . insulin aspart  0-5 Units Subcutaneous QHS  . insulin aspart  7 Units Subcutaneous TID WC  . insulin detemir  42 Units Subcutaneous QHS  . pantoprazole  40 mg Oral BID AC  . sodium chloride flush  3 mL Intravenous Q12H  . torsemide  30 mg Oral BID  . traZODone  150 mg Oral QHS  . verapamil  240 mg Oral Daily   Continuous Infusions:  PRN Meds: acetaminophen **OR** acetaminophen, alum & mag hydroxide-simeth, bisacodyl, nitroGLYCERIN, ondansetron **OR** ondansetron (ZOFRAN) IV, oxyCODONE-acetaminophen, polyethylene glycol, traMADol   Vital Signs    Vitals:   05/23/19 0438 05/23/19 0737 05/23/19 0755 05/23/19 0806  BP:   121/78 100/60  Pulse:   (!) 56 69  Resp:   20 19  Temp:   98 F (36.7 C) 97.6 F (36.4 C)  TempSrc:   Oral Oral  SpO2: 93% 94% 97% 95%  Weight:      Height:        Intake/Output Summary (Last 24 hours) at 05/23/2019 1041 Last data filed at 05/23/2019 1035 Gross per 24 hour  Intake -  Output 2700 ml  Net -2700 ml   Filed Weights   05/21/19 0354 05/22/19 0320 05/23/19 0328  Weight: 97.8 kg 99.6 kg 99.3 kg    Telemetry    SR with intermittent A pacing, artifact - Personally Reviewed   ECG    No new tracings - Personally Reviewed  Physical Exam   GEN: No acute distress, obese.   Neck: JVD difficult to assess secondary to body habitus. Cardiac: RRR, II/VI systolic murmur at the apex, no rubs, or gallops.  Respiratory: Diminished breath sounds bilaterally.  GI: Soft, nontender, mildly distended.   MS: No edema; No deformity. Neuro:  Alert and oriented x 3; Nonfocal.  Psych: Normal affect.  Labs    Chemistry Recent Labs  Lab 05/19/19 0835 05/22/19 0513 05/23/19 0707  NA 137 138 138  K 4.5 4.0 3.8  CL 102 107 104  CO2 25 24 25   GLUCOSE 213* 190* 156*  BUN 19 19 21*  CREATININE 1.20* 0.83 0.91  CALCIUM 8.6* 8.7* 8.8*  GFRNONAA 49* >60 >60  GFRAA 57* >60 >60  ANIONGAP 10 7 9      Hematology Recent Labs  Lab 05/19/19 0835 05/21/19 0845 05/23/19 0707  WBC 7.2 7.3 7.2  RBC 3.79* 3.92 3.92  HGB 10.1* 10.5* 10.5*  HCT 31.9* 32.9* 32.6*  MCV 84.2 83.9 83.2  MCH 26.6 26.8 26.8  MCHC 31.7 31.9 32.2  RDW 15.6* 15.8* 15.6*  PLT 253 287 304    Cardiac EnzymesNo results  for input(s): TROPONINI in the last 168 hours. No results for input(s): TROPIPOC in the last 168 hours.   BNP Recent Labs  Lab 05/19/19 0835  BNP 463.0*     DDimer No results for input(s): DDIMER in the last 168 hours.   Radiology    No results found.  Cardiac Studies   2D Echo 04/18/2018: - Left ventricle: The cavity size was normal. There was moderate concentric hypertrophy, septal wall appears to be moderate to severe (apical region appears to be spared) with mild LVOT obstruction. Systolic function was normal. The estimated ejection fraction was in the range of 55% to 60%. Wall motion was normal; there were no regional wall motion abnormalities. Features are consistent with a pseudonormal left ventricular filling pattern, with concomitant abnormal relaxation and increased filling pressure (grade 2 diastolic dysfunction). - Aortic valve: There was  trivial regurgitation. - Mitral valve: There was mild to moderate regurgitation. - Left atrium: The atrium was mildly dilated. - Right ventricle: Systolic function was normal. - Pulmonary arteries: Systolic pressure could not be accurately estimated. Impressions: - Definity used. __________  2D Echo 05/21/2019: 1. Left ventricular ejection fraction, by visual estimation, is >75%. The left ventricle has hyperdynamic function. There is severely increased left ventricular hypertrophy.  2. Definity contrast agent was given IV to delineate the left ventricular endocardial borders.  3. Elevated mean left atrial pressure.  4. Hypertrophic cardiomyopathy.  5. Left ventricular diastolic Doppler parameters are consistent with pseudonormalization pattern of LV diastolic filling.  6. Global right ventricle was not well visualized.The right ventricular size is not well visualized. No increase in right ventricular wall thickness.  7. Left atrial size was mildly dilated.  8. Right atrial size was not well visualized.  9. The pericardium was not well visualized. 10. Mild aortic valve annular calcification. 11. The mitral valve is degenerative. Mild mitral valve regurgitation. 12. The tricuspid valve is not well visualized. Tricuspid valve regurgitation was not visualized by color flow Doppler. 13. The aortic valve was not well visualized Aortic valve regurgitation is trivial by color flow Doppler. Mild aortic valve sclerosis without stenosis. 14. The pulmonic valve was not well visualized. Pulmonic valve regurgitation is not visualized by color flow Doppler. 15. The inferior vena cava is normal in size with greater than 50% respiratory variability, suggesting right atrial pressure of 3 mmHg. 16. The interatrial septum was not well visualized.  Patient Profile     58 y.o. female with history of nonobstructive CAD, HCM s/p ICD previously followed by [facility], HFpEF, hypertensive heart disease, mild to  moderate mitral regurgitation, thyroid nodule, IDDM, HTN, obesity, OSA, and COPD who we are seeing for SOB.  Assessment & Plan    1. Dyspnea: -Appears to be consistent with AECOPD given improvement noted with nebulizer therapy over the past 24 hours -Cannot exclude some component of heart failure/HCM, anemia, obesity and physical deconditioning   -Cannot exclude ischemia, though this does seem to be less likely given recent nonobstructive cath in 2017 -Continue nebs per IM -If exertional dyspnea persists she may need R/LHC prior to discharge   2. HCM s/p ICD: -Continue gentle diuresis with torsemide 30 mg bid with KCl repletion -Continue increased dose of verapamil and lower dose Coreg -As an outpatient, she will need to be evaluated by the advanced heart failure clinic initially with consideration for referral to HCM subspecialty  -Will need repeat R/LHC at some point in the near future with timing being based on her progression   3.  HTN: -Well controlled -Continue current therapy   4. AECOPD: -Nebs have helped with her wheezing significantly, though exertional dyspnea persists  -Per IM  For questions or updates, please contact [place] Please consult [email or web address] for contact info under Cardiology/STEMI.    Signed, Christell Faith, PA-C [facility] Pager: [phone] 05/23/2019, 10:41 AM

## 2019-05-23 NOTE — Progress Notes (Signed)
[place] [facility] [place] NAME: Julie Bender    MR#:  [id number]  DATE OF BIRTH:  [birth year]  SUBJECTIVE:  CHIEF COMPLAINT:   Chief Complaint  Patient presents with  . Shortness of Breath  . Chest Pain   The patient has better chest pain, still have shortness of breath.  REVIEW OF SYSTEMS:  Review of Systems  Constitutional: Negative for chills, fever and malaise/fatigue.  HENT: Negative for sore throat.   Eyes: Negative for blurred vision and double vision.  Respiratory: Positive for shortness of breath. Negative for cough, hemoptysis, wheezing and stridor.   Cardiovascular: Positive for chest pain. Negative for palpitations, orthopnea and leg swelling.  Gastrointestinal: Negative for abdominal pain, blood in stool, diarrhea, melena, nausea and vomiting.  Genitourinary: Negative for dysuria, flank pain and hematuria.  Musculoskeletal: Negative for back pain and joint pain.  Neurological: Negative for dizziness, sensory change, focal weakness, seizures, loss of consciousness, weakness and headaches.  Endo/Heme/Allergies: Negative for polydipsia.  Psychiatric/Behavioral: Negative for depression. The patient is not nervous/anxious.     DRUG ALLERGIES:  No Known Allergies VITALS:  Blood pressure 137/68, pulse (!) 55, temperature 97.6 F (36.4 C), temperature source Oral, resp. rate 19, height 5\' 4"  (1.626 m), weight 99.3 kg, SpO2 95 %. PHYSICAL EXAMINATION:  Physical Exam Constitutional:      General: She is not in acute distress.    Appearance: Normal appearance. She is obese.  HENT:     Head: Normocephalic.  Eyes:     General: No scleral icterus.    Conjunctiva/sclera: Conjunctivae normal.     Pupils: Pupils are equal, round, and reactive to light.  Neck:     Musculoskeletal: Normal range of motion and neck supple.     Vascular: No JVD.     Trachea: No tracheal deviation.  Cardiovascular:     Rate and Rhythm: Normal rate and  regular rhythm.     Heart sounds: Normal heart sounds. No murmur. No gallop.   Pulmonary:     Effort: Pulmonary effort is normal. No respiratory distress.     Breath sounds: Normal breath sounds. No wheezing or rales.  Chest:     Chest wall: Tenderness present.  Abdominal:     General: Bowel sounds are normal. There is no distension.     Palpations: Abdomen is soft.     Tenderness: There is abdominal tenderness. There is no rebound.  Musculoskeletal: Normal range of motion.        General: No tenderness.     Right lower leg: No edema.     Left lower leg: No edema.  Skin:    Findings: No erythema or rash.  Neurological:     General: No focal deficit present.     Mental Status: She is alert and oriented to person, place, and time.     Cranial Nerves: No cranial nerve deficit.  Psychiatric:        Mood and Affect: Mood normal.    LABORATORY PANEL:  Female CBC Recent Labs  Lab 05/23/19 0707  WBC 7.2  HGB 10.5*  HCT 32.6*  PLT 304   ------------------------------------------------------------------------------------------------------------------ Chemistries  Recent Labs  Lab 05/23/19 0707  NA 138  K 3.8  CL 104  CO2 25  GLUCOSE 156*  BUN 21*  CREATININE 0.91  CALCIUM 8.8*   RADIOLOGY:  No results found. ASSESSMENT AND PLAN:   * Atypical chest pain and shortness of breath on exertion, acute diastolic  congestive heart failure. Continue aspirin, Coreg and statin.  Nitro PRN.   Echocardiogram: Left ventricular ejection fraction, by visual estimation, is >75%. The left ventricle has hyperdynamic function. There is severely increased left ventricular hypertrophy. Per Dr. Saunders Revel, increase her torsemide to 30 mg BID and also schedule albuterol nebs Q4 hour; decreased carvedilol and increased verapamil.  Consultation with pulmonary regarding possible obstructive lung disease and/or upper airway dysfunction (e.g. vocal cord dysfunction). Patient states torsemide does not work  good and requesting IV Lasix saw her given 1 dose today.  *  COPD.  Stable.  Nebulizer as needed.  Continue inhalers.  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  Continue patient's long-acting insulin and pre-meal insulin.  Sliding scale insulin ordered.  Diabetic diet.   * Hypertension.  Continue home medications.  Controlled.  *Chronic diastolic CHF.  No signs of fluid overload.  Continue torsemide.  *Gastritis/GERD.  Epigastric pain after eating.  on PPI.  All the records are reviewed and case discussed with Care Management/Social Worker. Management plans discussed with the patient, family and they are in agreement.  CODE STATUS: Full Code  TOTAL TIME TAKING CARE OF THIS PATIENT: 36 minutes.   More than 50% of the time was spent in counseling/coordination of care: YES  POSSIBLE D/C IN 1-2 DAYS, DEPENDING ON CLINICAL CONDITION.   Vaughan Basta M.D on 05/23/2019 at 2:35 PM  Between 7am to 6pm - Pager - [phone]  After 6pm go to [email or web address] - [facility]

## 2019-05-23 NOTE — Progress Notes (Signed)
Pulmonary Medicine          Date: 05/23/2019,   MRN# [id number] Julie Bender [birth year]     AdmissionWeight: 97.1 kg                 CurrentWeight: 99.3 kg      CHIEF COMPLAINT:   Severe shortness of breath   Subjective   Patient reports improved shortness of breath and chest discomfort.  Question her history of COPD due to a never smoking status.  Will need outpatient evaluation to clarify diagnosis.    PAST MEDICAL HISTORY   Past Medical History:  Diagnosis Date  . CHF (congestive heart failure) ([place])   . COPD (chronic obstructive pulmonary disease) ([place])   . Diabetes mellitus without complication ([place])   . Diabetic retinopathy ([place])   . History of placement of internal cardiac defibrillator   . Hypertension   . Hypertrophic cardiomyopathy ([place])      SURGICAL HISTORY   Past Surgical History:  Procedure Laterality Date  . CARDIAC DEFIBRILLATOR PLACEMENT    . CHOLECYSTECTOMY       FAMILY HISTORY   Family History  Problem Relation Age of Onset  . Hypertrophic cardiomyopathy Sister      SOCIAL HISTORY   Social History   Tobacco Use  . Smoking status: Passive Smoke Exposure - Never Smoker  . Smokeless tobacco: Never Used  Substance Use Topics  . Alcohol use: No  . Drug use: Never     MEDICATIONS    Home Medication:  Current Outpatient Rx  . Order #: [id number] Class: Print    Current Medication:  Current Facility-Administered Medications:  .  acetaminophen (TYLENOL) tablet 650 mg, 650 mg, Oral, Q6H PRN, 650 mg at 05/20/19 1320 **OR** acetaminophen (TYLENOL) suppository 650 mg, 650 mg, Rectal, Q6H PRN, Sudini, Srikar, MD .  albuterol (PROVENTIL) (2.5 MG/3ML) 0.083% nebulizer solution 2.5 mg, 2.5 mg, Nebulization, Q4H, End, Christopher, MD, 2.5 mg at 05/23/19 1603 .  alum & mag hydroxide-simeth (MAALOX/MYLANTA) 200-200-20 MG/5ML suspension 30 mL, 30 mL, Oral, Q6H PRN, Demetrios Loll, MD .  aspirin EC tablet 81 mg, 81 mg, Oral, Daily,  Sudini, Srikar, MD, 81 mg at 05/23/19 0849 .  atorvastatin (LIPITOR) tablet 40 mg, 40 mg, Oral, q1800, Sudini, Srikar, MD, 40 mg at 05/23/19 1707 .  bisacodyl (DULCOLAX) EC tablet 5 mg, 5 mg, Oral, Daily PRN, Demetrios Loll, MD, 5 mg at 05/23/19 1229 .  citalopram (CELEXA) tablet 20 mg, 20 mg, Oral, Daily, Demetrios Loll, MD, 20 mg at 05/23/19 0849 .  enoxaparin (LOVENOX) injection 40 mg, 40 mg, Subcutaneous, Q24H, Sudini, Srikar, MD, 40 mg at 05/22/19 2143 .  gabapentin (NEURONTIN) capsule 300 mg, 300 mg, Oral, BID, Sudini, Srikar, MD, 300 mg at 05/23/19 0849 .  insulin aspart (novoLOG) injection 0-15 Units, 0-15 Units, Subcutaneous, TID WC, Hillary Bow, MD, 3 Units at 05/23/19 1708 .  insulin aspart (novoLOG) injection 0-5 Units, 0-5 Units, Subcutaneous, QHS, Hillary Bow, MD, 2 Units at 05/20/19 2124 .  insulin aspart (novoLOG) injection 7 Units, 7 Units, Subcutaneous, TID WC, Hillary Bow, MD, 7 Units at 05/23/19 1707 .  insulin detemir (LEVEMIR) injection 42 Units, 42 Units, Subcutaneous, QHS, Hillary Bow, MD, 42 Units at 05/22/19 2142 .  metoprolol succinate (TOPROL-XL) 24 hr tablet 25 mg, 25 mg, Oral, Daily, Agbor-Etang, Brian, MD, 25 mg at 05/23/19 1707 .  nitroGLYCERIN (NITROSTAT) SL tablet 0.4 mg, 0.4 mg, Sublingual, Q5 min PRN, Hillary Bow, MD .  ondansetron [facility])  tablet 4 mg, 4 mg, Oral, Q6H PRN **OR** ondansetron (ZOFRAN) injection 4 mg, 4 mg, Intravenous, Q6H PRN, Sudini, Srikar, MD .  oxyCODONE-acetaminophen (PERCOCET/ROXICET) 5-325 MG per tablet 1 tablet, 1 tablet, Oral, Q6H PRN, Hillary Bow, MD, 1 tablet at 05/22/19 2141 .  pantoprazole (PROTONIX) EC tablet 40 mg, 40 mg, Oral, BID AC, Demetrios Loll, MD, 40 mg at 05/23/19 1707 .  polyethylene glycol (MIRALAX / GLYCOLAX) packet 17 g, 17 g, Oral, Daily PRN, Hillary Bow, MD, 17 g at 05/21/19 2011 .  sodium chloride flush (NS) 0.9 % injection 3 mL, 3 mL, Intravenous, Q12H, Sudini, Srikar, MD, 3 mL at 05/23/19 0850 .  torsemide  (DEMADEX) tablet 30 mg, 30 mg, Oral, BID, End, Christopher, MD, 30 mg at 05/23/19 1707 .  traMADol (ULTRAM) tablet 50 mg, 50 mg, Oral, Q6H PRN, Hillary Bow, MD, 50 mg at 05/20/19 2122 .  traZODone (DESYREL) tablet 150 mg, 150 mg, Oral, QHS, Sudini, Srikar, MD, 150 mg at 05/22/19 2141 .  verapamil (CALAN-SR) CR tablet 240 mg, 240 mg, Oral, Daily, End, Christopher, MD, 240 mg at 05/23/19 0848    ALLERGIES   Patient has no known allergies.     REVIEW OF SYSTEMS    Review of Systems:  Gen:  Denies  fever, sweats, chills weigh loss  HEENT: Denies blurred vision, double vision, ear pain, eye pain, hearing loss, nose bleeds, sore throat Cardiac:  No dizziness, chest pain or heaviness, chest tightness,edema Resp:   Denies cough or sputum porduction, shortness of breath,wheezing, hemoptysis,  Gi: Denies swallowing difficulty, stomach pain, nausea or vomiting, diarrhea, constipation, bowel incontinence Gu:  Denies bladder incontinence, burning urine Ext:   Denies Joint pain, stiffness or swelling Skin: Denies  skin rash, easy bruising or bleeding or hives Endoc:  Denies polyuria, polydipsia , polyphagia or weight change Psych:   Denies depression, insomnia or hallucinations   Other:  All other systems negative   VS: BP 126/63   Pulse (!) 57   Temp 97.7 F (36.5 C) (Oral)   Resp 19   Ht 5\' 4"  (1.626 m)   Wt 99.3 kg   SpO2 96%   BMI 37.57 kg/m      PHYSICAL EXAM    GENERAL:NAD, no fevers, chills, no weakness no fatigue HEAD: Normocephalic, atraumatic.  EYES: Pupils equal, round, reactive to light. Extraocular muscles intact. No scleral icterus.  MOUTH: Moist mucosal membrane. Dentition intact. No abscess noted.  EAR, NOSE, THROAT: Clear without exudates. No external lesions.  NECK: Supple. No thyromegaly. No nodules. No JVD.  PULMONARY: mild rhonchi with no crackles CARDIOVASCULAR: S1 and S2. Regular rate and rhythm. No murmurs, rubs, or gallops. No edema. Pedal pulses  2+ bilaterally.  GASTROINTESTINAL: Soft, nontender, nondistended. No masses. Positive bowel sounds. No hepatosplenomegaly.  MUSCULOSKELETAL: No swelling, clubbing, or edema. Range of motion full in all extremities.  NEUROLOGIC: Cranial nerves II through XII are intact. No gross focal neurological deficits. Sensation intact. Reflexes intact.  SKIN: No ulceration, lesions, rashes, or cyanosis. Skin warm and dry. Turgor intact.  PSYCHIATRIC: Mood, affect within normal limits. The patient is awake, alert and oriented x 3. Insight, judgment intact.       IMAGING    Dg Chest 2 View  Result Date: 05/19/2019 CLINICAL DATA:  Bilateral lower extremity pain. Shortness of breath. EXAM: CHEST - 2 VIEW COMPARISON:  02/23/2019 FINDINGS: There is mild bilateral interstitial thickening. There is no focal consolidation. There is no pleural effusion or pneumothorax. There is stable  cardiomegaly. There is a dual lead cardiac pacemaker. There is no acute osseous abnormality. IMPRESSION: Cardiomegaly with mild pulmonary vascular congestion. Electronically Signed   By: Kathreen Devoid   On: 05/19/2019 09:22   Ct Angio Chest Pe W And/or Wo Contrast  Result Date: 05/19/2019 CLINICAL DATA:  Bilateral lower extremity pain and shortness of breath. EXAM: CT ANGIOGRAPHY CHEST CT ABDOMEN AND PELVIS WITH CONTRAST TECHNIQUE: Multidetector CT imaging of the chest was performed using the standard protocol during bolus administration of intravenous contrast. Multiplanar CT image reconstructions and MIPs were obtained to evaluate the vascular anatomy. Multidetector CT imaging of the abdomen and pelvis was performed using the standard protocol during bolus administration of intravenous contrast. CONTRAST:  154mL OMNIPAQUE IOHEXOL 350 MG/ML SOLN COMPARISON:  CT dated 04/18/2018 FINDINGS: CTA CHEST FINDINGS Cardiovascular: Contrast injection is sufficient to demonstrate satisfactory opacification of the pulmonary arteries to the segmental  level. There is no pulmonary embolus. The main pulmonary artery is dilated measuring approximately 3.8 cm in diameter. There is no CT evidence of acute right heart strain. There are mild atherosclerotic changes of the visualized thoracic aorta. Heart size is enlarged. There is significant wall thickening of the left ventricle. 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 There is no significant pericardial effusion. Mediastinum/Nodes: --No mediastinal or hilar lymphadenopathy. --No axillary lymphadenopathy. --No supraclavicular lymphadenopathy. --a large left-sided thyroid nodule is again noted. --The esophagus is unremarkable Lungs/Pleura: The lung volumes are low. The trachea is unremarkable. There is a trace right-sided pleural effusion. There is no pneumothorax or focal infiltrate. There is a small amount of atelectasis in the lingula. Musculoskeletal: No chest wall abnormality. No acute or significant osseous findings. Review of the MIP images confirms the above findings. CT ABDOMEN and PELVIS FINDINGS Hepatobiliary: The liver is normal. Status post cholecystectomy.There is no biliary ductal dilation. Pancreas: Normal contours without ductal dilatation. No peripancreatic fluid collection. Spleen: No splenic laceration or hematoma. Adrenals/Urinary Tract: --Adrenal glands: No adrenal hemorrhage. --Right kidney/ureter: No hydronephrosis or perinephric hematoma. --Left kidney/ureter: No hydronephrosis or perinephric hematoma. --Urinary bladder: Unremarkable. Stomach/Bowel: --Stomach/Duodenum: No hiatal hernia or other gastric abnormality. Normal duodenal course and caliber. --Small bowel: No dilatation or inflammation. --Colon: No focal abnormality. --Appendix: Normal. Vascular/Lymphatic: Atherosclerotic calcification is present within the non-aneurysmal abdominal aorta, without hemodynamically significant stenosis. --No retroperitoneal lymphadenopathy. --No mesenteric lymphadenopathy. --No pelvic or inguinal  lymphadenopathy. Reproductive: Unremarkable Other: There is a partially visualized 1.7 x 1.2 cm subcutaneous nodule in the left gluteal fold. The abdominal wall is normal. Musculoskeletal. No acute displaced fractures. Review of the MIP images confirms the above findings. IMPRESSION: 1. No acute thoracic, abdominal or pelvic injury. 2. Cardiomegaly with significant wall thickening of the left ventricle. 3. Trace right-sided pleural effusion. 4. Dilated main pulmonary artery which can be seen in patients with elevated pulmonary artery pressures. 5. Partially visualized 1.7 x 1.2 cm subcutaneous nodule in the left gluteal fold. Recommend correlation with physical examination. This could represent a sebaceous cyst. Aortic Atherosclerosis (ICD10-I70.0). Electronically Signed   By: Constance Holster M.D.   On: 05/19/2019 11:06   Ct Abdomen Pelvis W Contrast  Result Date: 05/19/2019 CLINICAL DATA:  Bilateral lower extremity pain and shortness of breath. EXAM: CT ANGIOGRAPHY CHEST CT ABDOMEN AND PELVIS WITH CONTRAST TECHNIQUE: Multidetector CT imaging of the chest was performed using the standard protocol during bolus administration of intravenous contrast. Multiplanar CT image reconstructions and MIPs were obtained to evaluate the vascular anatomy. Multidetector CT imaging of the abdomen and pelvis was  performed using the standard protocol during bolus administration of intravenous contrast. CONTRAST:  155mL OMNIPAQUE IOHEXOL 350 MG/ML SOLN COMPARISON:  CT dated 04/18/2018 FINDINGS: CTA CHEST FINDINGS Cardiovascular: Contrast injection is sufficient to demonstrate satisfactory opacification of the pulmonary arteries to the segmental level. There is no pulmonary embolus. The main pulmonary artery is dilated measuring approximately 3.8 cm in diameter. There is no CT evidence of acute right heart strain. There are mild atherosclerotic changes of the visualized thoracic aorta. Heart size is enlarged. There is significant  wall thickening of the left ventricle. 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 There is no significant pericardial effusion. Mediastinum/Nodes: --No mediastinal or hilar lymphadenopathy. --No axillary lymphadenopathy. --No supraclavicular lymphadenopathy. --a large left-sided thyroid nodule is again noted. --The esophagus is unremarkable Lungs/Pleura: The lung volumes are low. The trachea is unremarkable. There is a trace right-sided pleural effusion. There is no pneumothorax or focal infiltrate. There is a small amount of atelectasis in the lingula. Musculoskeletal: No chest wall abnormality. No acute or significant osseous findings. Review of the MIP images confirms the above findings. CT ABDOMEN and PELVIS FINDINGS Hepatobiliary: The liver is normal. Status post cholecystectomy.There is no biliary ductal dilation. Pancreas: Normal contours without ductal dilatation. No peripancreatic fluid collection. Spleen: No splenic laceration or hematoma. Adrenals/Urinary Tract: --Adrenal glands: No adrenal hemorrhage. --Right kidney/ureter: No hydronephrosis or perinephric hematoma. --Left kidney/ureter: No hydronephrosis or perinephric hematoma. --Urinary bladder: Unremarkable. Stomach/Bowel: --Stomach/Duodenum: No hiatal hernia or other gastric abnormality. Normal duodenal course and caliber. --Small bowel: No dilatation or inflammation. --Colon: No focal abnormality. --Appendix: Normal. Vascular/Lymphatic: Atherosclerotic calcification is present within the non-aneurysmal abdominal aorta, without hemodynamically significant stenosis. --No retroperitoneal lymphadenopathy. --No mesenteric lymphadenopathy. --No pelvic or inguinal lymphadenopathy. Reproductive: Unremarkable Other: There is a partially visualized 1.7 x 1.2 cm subcutaneous nodule in the left gluteal fold. The abdominal wall is normal. Musculoskeletal. No acute displaced fractures. Review of the MIP images confirms the above findings. IMPRESSION: 1. No acute  thoracic, abdominal or pelvic injury. 2. Cardiomegaly with significant wall thickening of the left ventricle. 3. Trace right-sided pleural effusion. 4. Dilated main pulmonary artery which can be seen in patients with elevated pulmonary artery pressures. 5. Partially visualized 1.7 x 1.2 cm subcutaneous nodule in the left gluteal fold. Recommend correlation with physical examination. This could represent a sebaceous cyst. Aortic Atherosclerosis (ICD10-I70.0). Electronically Signed   By: Constance Holster M.D.   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  Mild sleep apnea with an AHI of 7.1.  CPAP titration study was recommended as well as work-up for periodic limb movement disorder. **CPAP titration study 12/05/2017>> PLM index 65, PLM arousal index of 2.  Hypopnea index remained elevated above 10 and all pressures until a pressure of 10 was achieved and AHI  was 0. CPAP of 10 was recommended.  -patient did not wear consistently.        Thank you for allowing me to participate in the care of this patient.    Patient/Family are satisfied with care plan and all questions have been answered.  This document was prepared using Dragon voice recognition software and may include unintentional dictation errors.     Ottie Glazier, M.D.  Division of [place]

## 2019-05-23 NOTE — Progress Notes (Signed)
Inpatient Diabetes Program Recommendations  AACE/ADA: New Consensus Statement on Inpatient Glycemic Control (2015)  Target Ranges:  Prepandial:   less than 140 mg/dL      Peak postprandial:   less than 180 mg/dL (1-2 hours)      Critically ill patients:  140 - 180 mg/dL   Results for Julie Bender, Julie Bender (MRN [id number]) as of 05/23/2019 13:13  Ref. Range 05/22/2019 07:27 05/22/2019 11:22 05/22/2019 16:43 05/22/2019 16:44 05/22/2019 21:01  Glucose-Capillary Latest Ref Range: 70 - 99 mg/dL 142 (H)  2 units NOVOLOG  257 (H)  15 units NOVOLOG  327 (H)  18 units NOVOLOG  319 (H) 189 (H)     42 units LEVEMIR   Results for Julie Bender, Julie Bender (MRN [id number]) as of 05/23/2019 13:13  Ref. Range 05/23/2019 07:57 05/23/2019 11:59  Glucose-Capillary Latest Ref Range: 70 - 99 mg/dL 140 (H)  9 units NOVOLOG  227 (H)  12 units NOVOLOG    Home DM Meds: Levemir 42 units QHS       Novolog 13 units TID with meals       Metformin 850 mg TID       Victoza 0.6 mg daily   Current Orders: Levemir 42 units QHS       Novolog Moderate Correction Scale/ SSI (0-15 units) TID AC + HS      Novolog 7 units TID with meals       MD- Please consider increasing Novolog Meal Coverage to:  Novolog 10 units TID with meals  (Please add the following Hold Parameters: Hold if pt eats <50% of meal, Hold if pt NPO)     --Will follow patient during hospitalization--  Wyn Quaker RN, MSN, CDE Diabetes Coordinator Inpatient Glycemic Control Team Team Pager: [phone] (8a-5p)

## 2019-05-24 DIAGNOSIS — R0602 Shortness of breath: Secondary | ICD-10-CM

## 2019-05-24 DIAGNOSIS — I110 Hypertensive heart disease with heart failure: Principal | ICD-10-CM

## 2019-05-24 LAB — GLUCOSE, CAPILLARY
Glucose-Capillary: 355 mg/dL — ABNORMAL HIGH (ref 70–99)
Glucose-Capillary: 340 mg/dL — ABNORMAL HIGH (ref 70–99)
Glucose-Capillary: 197 mg/dL — ABNORMAL HIGH (ref 70–99)

## 2019-05-24 LAB — BASIC METABOLIC PANEL
Anion gap: 10 (ref 5–15)
BUN: 18 mg/dL (ref 6–20)
CO2: 24 mmol/L (ref 22–32)
Calcium: 9.2 mg/dL (ref 8.9–10.3)
Chloride: 104 mmol/L (ref 98–111)
Creatinine, Ser: 0.92 mg/dL (ref 0.44–1.00)
GFR calc Af Amer: >60 mL/min (ref >60)
GFR calc non Af Amer: >60 mL/min (ref >60)
Glucose, Bld: 176 mg/dL — ABNORMAL HIGH (ref 70–99)
Potassium: 3.7 mmol/L (ref 3.5–5.1)
Sodium: 138 mmol/L (ref 135–145)

## 2019-05-24 MED ORDER — SODIUM CHLORIDE 0.9 % IV SOLN
INTRAVENOUS | Status: DC | PRN
  Administered 2019-05-24: 250 mL via INTRAVENOUS

## 2019-05-24 MED ORDER — INSULIN DETEMIR 100 UNIT/ML ~~LOC~~ SOLN
48.0000 [IU] | Freq: Every day | SUBCUTANEOUS | Status: DC
  Administered 2019-05-24 – 2019-05-25 (×2): 48 [IU] via SUBCUTANEOUS
  Filled 2019-05-24 (×3): qty 0.48

## 2019-05-24 MED ORDER — INSULIN ASPART 100 UNIT/ML ~~LOC~~ SOLN
10.0000 [IU] | Freq: Three times a day (TID) | SUBCUTANEOUS | Status: DC
  Administered 2019-05-24 – 2019-05-26 (×5): 10 [IU] via SUBCUTANEOUS
  Filled 2019-05-24 (×5): qty 1

## 2019-05-24 MED ORDER — ALBUTEROL SULFATE (2.5 MG/3ML) 0.083% IN NEBU
2.5000 mg | INHALATION_SOLUTION | Freq: Four times a day (QID) | RESPIRATORY_TRACT | Status: DC
  Administered 2019-05-24 – 2019-05-26 (×7): 2.5 mg via RESPIRATORY_TRACT
  Filled 2019-05-24 (×7): qty 3

## 2019-05-24 MED ORDER — FUROSEMIDE 10 MG/ML IJ SOLN
40.0000 mg | Freq: Two times a day (BID) | INTRAMUSCULAR | Status: DC
  Administered 2019-05-24: 40 mg via INTRAVENOUS
  Filled 2019-05-24: qty 4

## 2019-05-24 MED ORDER — TORSEMIDE 20 MG PO TABS
20.0000 mg | ORAL_TABLET | Freq: Every day | ORAL | Status: DC
  Administered 2019-05-24 – 2019-05-25 (×2): 20 mg via ORAL
  Filled 2019-05-24 (×2): qty 1

## 2019-05-24 MED ORDER — ALBUMIN HUMAN 25 % IV SOLN
12.5000 g | Freq: Once | INTRAVENOUS | Status: AC
  Administered 2019-05-24: 12.5 g via INTRAVENOUS
  Filled 2019-05-24: qty 50

## 2019-05-24 NOTE — Progress Notes (Signed)
Notify Dr. Anselm Jungling about patient is asking if she can see a diabetic coordinator here. Patient's blood sugar has been in 300's, novolog meal coverage has been increase. Will also provide education and hand out about diabetic diet for the patient. RN will continue to monitor.

## 2019-05-24 NOTE — Progress Notes (Signed)
Progress Note  Patient Name: Julie Bender Date of Encounter: 05/24/2019  Primary Cardiologist: new to [facility] (previously followed by [facility])  Subjective   She received po torsemide 30 mg bid along with IV Lasix 40 mg x 1 (IM) followed by 60 mg daily (PCCM) on 10/7. Not as SOB, feels like she is recovering quicker after ambulation. No chest pain. Documented UOP of 4.5 L for the past 24 hours with no documented input. Net - 14.7 L. Weight 99.3-->96.6 kg over the past 24 hours. Labs pending this morning.  Inpatient Medications    Scheduled Meds:  albuterol  2.5 mg Nebulization Q4H   aspirin EC  81 mg Oral Daily   atorvastatin  40 mg Oral q1800   budesonide (PULMICORT) nebulizer solution  0.25 mg Nebulization BID   citalopram  20 mg Oral Daily   enoxaparin (LOVENOX) injection  40 mg Subcutaneous Q24H   furosemide  60 mg Intravenous Daily   gabapentin  300 mg Oral BID   insulin aspart  0-15 Units Subcutaneous TID WC   insulin aspart  0-5 Units Subcutaneous QHS   insulin aspart  7 Units Subcutaneous TID WC   insulin detemir  42 Units Subcutaneous QHS   metoprolol succinate  25 mg Oral Daily   pantoprazole  40 mg Oral BID AC   sodium chloride flush  3 mL Intravenous Q12H   torsemide  30 mg Oral BID   traZODone  150 mg Oral QHS   verapamil  240 mg Oral Daily   Continuous Infusions:  PRN Meds: acetaminophen **OR** acetaminophen, alum & mag hydroxide-simeth, bisacodyl, nitroGLYCERIN, ondansetron **OR** ondansetron (ZOFRAN) IV, oxyCODONE-acetaminophen, polyethylene glycol, traMADol   Vital Signs    Vitals:   05/24/19 0042 05/24/19 0318 05/24/19 0319 05/24/19 0455  BP:  (!) 142/60    Pulse:  (!) 55    Resp:  16    Temp:  97.9 F (36.6 C)    TempSrc:  Oral    SpO2: 97% 95%  95%  Weight:   96.6 kg   Height:        Intake/Output Summary (Last 24 hours) at 05/24/2019 0758 Last data filed at 05/24/2019 0500 Gross per 24 hour  Intake --  Output 4500  ml  Net -4500 ml   Filed Weights   05/22/19 0320 05/23/19 0328 05/24/19 0319  Weight: 99.6 kg 99.3 kg 96.6 kg    Telemetry    Intermittent A pacing, 12 beat run of NSVT - Personally Reviewed  ECG    No new tracings - Personally Reviewed  Physical Exam   GEN: No acute distress.   Neck: No JVD. Cardiac: RRR, II/VI systolic murmur at the apex, no rubs, or gallops.  Respiratory: Diminished breath sounds bilateral bases.  GI: Obese, soft, nontender, mildly distended.   MS: No edema; No deformity. Neuro:  Alert and oriented x 3; Nonfocal.  Psych: Normal affect.  Labs    Chemistry Recent Labs  Lab 05/19/19 0835 05/22/19 0513 05/23/19 0707  NA 137 138 138  K 4.5 4.0 3.8  CL 102 107 104  CO2 25 24 25   GLUCOSE 213* 190* 156*  BUN 19 19 21*  CREATININE 1.20* 0.83 0.91  CALCIUM 8.6* 8.7* 8.8*  GFRNONAA 49* >60 >60  GFRAA 57* >60 >60  ANIONGAP 10 7 9      Hematology Recent Labs  Lab 05/19/19 0835 05/21/19 0845 05/23/19 0707  WBC 7.2 7.3 7.2  RBC 3.79* 3.92 3.92  HGB  10.1* 10.5* 10.5*  HCT 31.9* 32.9* 32.6*  MCV 84.2 83.9 83.2  MCH 26.6 26.8 26.8  MCHC 31.7 31.9 32.2  RDW 15.6* 15.8* 15.6*  PLT 253 287 304    Cardiac EnzymesNo results for input(s): TROPONINI in the last 168 hours. No results for input(s): TROPIPOC in the last 168 hours.   BNP Recent Labs  Lab 05/19/19 0835  BNP 463.0*     DDimer No results for input(s): DDIMER in the last 168 hours.   Radiology    No results found.  Cardiac Studies   2D Echo 04/18/2018: - Left ventricle: The cavity size was normal. There was moderate concentric hypertrophy, septal wall appears to be moderate to severe (apical region appears to be spared) with mild LVOT obstruction. Systolic function was normal. The estimated ejection fraction was in the range of 55% to 60%. Wall motion was normal; there were no regional wall motion abnormalities. Features are consistent with a pseudonormal left  ventricular filling pattern, with concomitant abnormal relaxation and increased filling pressure (grade 2 diastolic dysfunction). - Aortic valve: There was trivial regurgitation. - Mitral valve: There was mild to moderate regurgitation. - Left atrium: The atrium was mildly dilated. - Right ventricle: Systolic function was normal. - Pulmonary arteries: Systolic pressure could not be accurately estimated. Impressions: - Definity used. __________  2D Echo 05/21/2019: 1. Left ventricular ejection fraction, by visual estimation, is >75%. The left ventricle has hyperdynamic function. There is severely increased left ventricular hypertrophy. 2. Definity contrast agent was given IV to delineate the left ventricular endocardial borders. 3. Elevated mean left atrial pressure. 4. Hypertrophic cardiomyopathy. 5. Left ventricular diastolic Doppler parameters are consistent with pseudonormalization pattern of LV diastolic filling. 6. Global right ventricle was not well visualized.The right ventricular size is not well visualized. No increase in right ventricular wall thickness. 7. Left atrial size was mildly dilated. 8. Right atrial size was not well visualized. 9. The pericardium was not well visualized. 10. Mild aortic valve annular calcification. 11. The mitral valve is degenerative. Mild mitral valve regurgitation. 12. The tricuspid valve is not well visualized. Tricuspid valve regurgitation was not visualized by color flow Doppler. 13. The aortic valve was not well visualized Aortic valve regurgitation is trivial by color flow Doppler. Mild aortic valve sclerosis without stenosis. 14. The pulmonic valve was not well visualized. Pulmonic valve regurgitation is not visualized by color flow Doppler. 15. The inferior vena cava is normal in size with greater than 50% respiratory variability, suggesting right atrial pressure of 3 mmHg. 16. The interatrial septum was not well  visualized.  Patient Profile     58 y.o. female with history of nonobstructive CAD, HCM s/p ICD previously followed by [facility], HFpEF, hypertensive heart disease, mild to moderate mitral regurgitation, thyroid nodule, IDDM, HTN, obesity, OSA, and COPD who we are seeing for SOB.  Assessment & Plan    1. Dyspnea: -Likely multifactorial including HCM, AECOPD, anemia, obesity, and physical deconditioning  -Cannot exclude ischemia, though this does seem to be less likely given recent nonobstructive cath in 2017 -Symptoms improving -She was given torsemide 30 mg bid along with IV Lasix 40 mg and 60 mg on 10/7 -No BMP this morning -Will hold all diuretic therapy until renal function and potassium return given the above -It appears torsemide has already been sent in to her pharmacy by the primary service, though she is not yet ready for discharge   -She is currently  -Continue nebs per IM -If exertional dyspnea persists  she may need R/LHC prior to discharge   2. HCM s/p ICD: -Documented net - 14.7 L for the admission with weight trend of 99.3-->96.6 kg over the past 24 hours -Labs are pending this morning -Diuretic on hold this morning until we get her renal function back -Continue increased dose of verapamil and lower dose Coreg -As an outpatient, she will need to be evaluated by the advanced heart failure clinic initially with consideration for referral to [facility] subspecialty  -Will need repeat R/LHC at some point in the near future with timing being based on her progression   3. HTN: -Reasonably controlled -Continue current therapy   4. AECOPD: -Nebs have helped with her wheezing significantly, though exertional dyspnea persists  -Per IM  For questions or updates, please contact [facility] Please consult [email or web address] for contact info under Cardiology/STEMI.    Signed, Christell Faith, PA-C [place] Pager: [phone] 05/24/2019, 7:58 AM

## 2019-05-24 NOTE — Care Management Important Message (Signed)
Important Message  Patient Details  Name: Julie Bender MRN: [id number] Date of Birth: [birth year]   Medicare Important Message Given:  Yes     Dannette Barbara 05/24/2019, 1:28 PM

## 2019-05-24 NOTE — Progress Notes (Signed)
Pulmonary Medicine          Date: 05/24/2019,   MRN# [id number] Julie Bender [birth year]     AdmissionWeight: 97.1 kg                 CurrentWeight: 96.6 kg      CHIEF COMPLAINT:   Severe shortness of breath   Subjective   Patient reports improved shortness of breath and chest discomfort.  Diuresed well overnight.    PAST MEDICAL HISTORY   Past Medical History:  Diagnosis Date  . CHF (congestive heart failure) ([place])   . COPD (chronic obstructive pulmonary disease) ([place])   . Diabetes mellitus without complication ([name])   . Diabetic retinopathy ([place])   . History of placement of internal cardiac defibrillator   . Hypertension   . Hypertrophic cardiomyopathy ([place])      SURGICAL HISTORY   Past Surgical History:  Procedure Laterality Date  . CARDIAC DEFIBRILLATOR PLACEMENT    . CHOLECYSTECTOMY       FAMILY HISTORY   Family History  Problem Relation Age of Onset  . Hypertrophic cardiomyopathy Sister      SOCIAL HISTORY   Social History   Tobacco Use  . Smoking status: Passive Smoke Exposure - Never Smoker  . Smokeless tobacco: Never Used  Substance Use Topics  . Alcohol use: No  . Drug use: Never     MEDICATIONS    Home Medication:  Current Outpatient Rx  . Order #: [id number] Class: Print    Current Medication:  Current Facility-Administered Medications:  .  acetaminophen (TYLENOL) tablet 650 mg, 650 mg, Oral, Q6H PRN, 650 mg at 05/20/19 1320 **OR** acetaminophen (TYLENOL) suppository 650 mg, 650 mg, Rectal, Q6H PRN, Sudini, Srikar, MD .  albuterol (PROVENTIL) (2.5 MG/3ML) 0.083% nebulizer solution 2.5 mg, 2.5 mg, Nebulization, Q6H, Vaughan Basta, MD .  alum & mag hydroxide-simeth (MAALOX/MYLANTA) 200-200-20 MG/5ML suspension 30 mL, 30 mL, Oral, Q6H PRN, Demetrios Loll, MD .  aspirin EC tablet 81 mg, 81 mg, Oral, Daily, Sudini, Srikar, MD, 81 mg at 05/24/19 0856 .  atorvastatin (LIPITOR) tablet 40 mg, 40 mg, Oral, q1800,  Sudini, Srikar, MD, 40 mg at 05/24/19 1721 .  bisacodyl (DULCOLAX) EC tablet 5 mg, 5 mg, Oral, Daily PRN, Demetrios Loll, MD, 5 mg at 05/24/19 0856 .  budesonide (PULMICORT) nebulizer solution 0.25 mg, 0.25 mg, Nebulization, BID, Lanney Gins, Jhayden Demuro, MD, 0.25 mg at 05/24/19 0800 .  citalopram (CELEXA) tablet 20 mg, 20 mg, Oral, Daily, Demetrios Loll, MD, 20 mg at 05/24/19 0856 .  enoxaparin (LOVENOX) injection 40 mg, 40 mg, Subcutaneous, Q24H, Sudini, Srikar, MD, 40 mg at 05/23/19 2219 .  gabapentin (NEURONTIN) capsule 300 mg, 300 mg, Oral, BID, Sudini, Srikar, MD, 300 mg at 05/24/19 0856 .  insulin aspart (novoLOG) injection 0-15 Units, 0-15 Units, Subcutaneous, TID WC, Hillary Bow, MD, 11 Units at 05/24/19 1722 .  insulin aspart (novoLOG) injection 0-5 Units, 0-5 Units, Subcutaneous, QHS, Hillary Bow, MD, 3 Units at 05/23/19 2215 .  insulin aspart (novoLOG) injection 10 Units, 10 Units, Subcutaneous, TID WC, Vaughan Basta, MD, 10 Units at 05/24/19 1721 .  insulin detemir (LEVEMIR) injection 48 Units, 48 Units, Subcutaneous, QHS, Vaughan Basta, MD .  metoprolol succinate (TOPROL-XL) 24 hr tablet 25 mg, 25 mg, Oral, Daily, Agbor-Etang, Brian, MD, 25 mg at 05/24/19 0856 .  nitroGLYCERIN (NITROSTAT) SL tablet 0.4 mg, 0.4 mg, Sublingual, Q5 min PRN, Sudini, Srikar, MD .  ondansetron (ZOFRAN) tablet 4 mg, 4 mg, Oral,  Q6H PRN **OR** ondansetron (ZOFRAN) injection 4 mg, 4 mg, Intravenous, Q6H PRN, Sudini, Srikar, MD .  oxyCODONE-acetaminophen (PERCOCET/ROXICET) 5-325 MG per tablet 1 tablet, 1 tablet, Oral, Q6H PRN, Hillary Bow, MD, 1 tablet at 05/23/19 2324 .  pantoprazole (PROTONIX) EC tablet 40 mg, 40 mg, Oral, BID AC, Demetrios Loll, MD, 40 mg at 05/24/19 1602 .  polyethylene glycol (MIRALAX / GLYCOLAX) packet 17 g, 17 g, Oral, Daily PRN, Hillary Bow, MD, 17 g at 05/21/19 2011 .  sodium chloride flush (NS) 0.9 % injection 3 mL, 3 mL, Intravenous, Q12H, Sudini, Srikar, MD, 3 mL at 05/24/19  0856 .  traMADol (ULTRAM) tablet 50 mg, 50 mg, Oral, Q6H PRN, Hillary Bow, MD, 50 mg at 05/20/19 2122 .  traZODone (DESYREL) tablet 150 mg, 150 mg, Oral, QHS, Sudini, Srikar, MD, 150 mg at 05/23/19 2215 .  verapamil (CALAN-SR) CR tablet 240 mg, 240 mg, Oral, Daily, End, Christopher, MD, 240 mg at 05/24/19 0856    ALLERGIES   Patient has no known allergies.     REVIEW OF SYSTEMS    Review of Systems:  Gen:  Denies  fever, sweats, chills weigh loss  HEENT: Denies blurred vision, double vision, ear pain, eye pain, hearing loss, nose bleeds, sore throat Cardiac:  No dizziness, chest pain or heaviness, chest tightness,edema Resp:   Denies cough or sputum porduction, shortness of breath,wheezing, hemoptysis,  Gi: Denies swallowing difficulty, stomach pain, nausea or vomiting, diarrhea, constipation, bowel incontinence Gu:  Denies bladder incontinence, burning urine Ext:   Denies Joint pain, stiffness or swelling Skin: Denies  skin rash, easy bruising or bleeding or hives Endoc:  Denies polyuria, polydipsia , polyphagia or weight change Psych:   Denies depression, insomnia or hallucinations   Other:  All other systems negative   VS: BP 120/63 (BP Location: Right Arm)   Pulse 63   Temp 98.3 F (36.8 C) (Oral)   Resp 18   Ht 5\' 4"  (1.626 m)   Wt 96.6 kg   SpO2 94%   BMI 36.54 kg/m      PHYSICAL EXAM    GENERAL:NAD, no fevers, chills, no weakness no fatigue HEAD: Normocephalic, atraumatic.  EYES: Pupils equal, round, reactive to light. Extraocular muscles intact. No scleral icterus.  MOUTH: Moist mucosal membrane. Dentition intact. No abscess noted.  EAR, NOSE, THROAT: Clear without exudates. No external lesions.  NECK: Supple. No thyromegaly. No nodules. No JVD.  PULMONARY: mild rhonchi with no crackles CARDIOVASCULAR: S1 and S2. Regular rate and rhythm. No murmurs, rubs, or gallops. No edema. Pedal pulses 2+ bilaterally.  GASTROINTESTINAL: Soft, nontender,  nondistended. No masses. Positive bowel sounds. No hepatosplenomegaly.  MUSCULOSKELETAL: No swelling, clubbing, or edema. Range of motion full in all extremities.  NEUROLOGIC: Cranial nerves II through XII are intact. No gross focal neurological deficits. Sensation intact. Reflexes intact.  SKIN: No ulceration, lesions, rashes, or cyanosis. Skin warm and dry. Turgor intact.  PSYCHIATRIC: Mood, affect within normal limits. The patient is awake, alert and oriented x 3. Insight, judgment intact.       IMAGING    Dg Chest 2 View  Result Date: 05/19/2019 CLINICAL DATA:  Bilateral lower extremity pain. Shortness of breath. EXAM: CHEST - 2 VIEW COMPARISON:  02/23/2019 FINDINGS: There is mild bilateral interstitial thickening. There is no focal consolidation. There is no pleural effusion or pneumothorax. There is stable cardiomegaly. There is a dual lead cardiac pacemaker. There is no acute osseous abnormality. IMPRESSION: Cardiomegaly with mild pulmonary vascular congestion. Electronically  Signed   By: Kathreen Devoid   On: 05/19/2019 09:22   Ct Angio Chest Pe W And/or Wo Contrast  Result Date: 05/19/2019 CLINICAL DATA:  Bilateral lower extremity pain and shortness of breath. EXAM: CT ANGIOGRAPHY CHEST CT ABDOMEN AND PELVIS WITH CONTRAST TECHNIQUE: Multidetector CT imaging of the chest was performed using the standard protocol during bolus administration of intravenous contrast. Multiplanar CT image reconstructions and MIPs were obtained to evaluate the vascular anatomy. Multidetector CT imaging of the abdomen and pelvis was performed using the standard protocol during bolus administration of intravenous contrast. CONTRAST:  136mL OMNIPAQUE IOHEXOL 350 MG/ML SOLN COMPARISON:  CT dated 04/18/2018 FINDINGS: CTA CHEST FINDINGS Cardiovascular: Contrast injection is sufficient to demonstrate satisfactory opacification of the pulmonary arteries to the segmental level. There is no pulmonary embolus. The main  pulmonary artery is dilated measuring approximately 3.8 cm in diameter. There is no CT evidence of acute right heart strain. There are mild atherosclerotic changes of the visualized thoracic aorta. Heart size is enlarged. There is significant wall thickening of the left ventricle. 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 There is no significant pericardial effusion. Mediastinum/Nodes: --No mediastinal or hilar lymphadenopathy. --No axillary lymphadenopathy. --No supraclavicular lymphadenopathy. --a large left-sided thyroid nodule is again noted. --The esophagus is unremarkable Lungs/Pleura: The lung volumes are low. The trachea is unremarkable. There is a trace right-sided pleural effusion. There is no pneumothorax or focal infiltrate. There is a small amount of atelectasis in the lingula. Musculoskeletal: No chest wall abnormality. No acute or significant osseous findings. Review of the MIP images confirms the above findings. CT ABDOMEN and PELVIS FINDINGS Hepatobiliary: The liver is normal. Status post cholecystectomy.There is no biliary ductal dilation. Pancreas: Normal contours without ductal dilatation. No peripancreatic fluid collection. Spleen: No splenic laceration or hematoma. Adrenals/Urinary Tract: --Adrenal glands: No adrenal hemorrhage. --Right kidney/ureter: No hydronephrosis or perinephric hematoma. --Left kidney/ureter: No hydronephrosis or perinephric hematoma. --Urinary bladder: Unremarkable. Stomach/Bowel: --Stomach/Duodenum: No hiatal hernia or other gastric abnormality. Normal duodenal course and caliber. --Small bowel: No dilatation or inflammation. --Colon: No focal abnormality. --Appendix: Normal. Vascular/Lymphatic: Atherosclerotic calcification is present within the non-aneurysmal abdominal aorta, without hemodynamically significant stenosis. --No retroperitoneal lymphadenopathy. --No mesenteric lymphadenopathy. --No pelvic or inguinal lymphadenopathy. Reproductive: Unremarkable Other: There  is a partially visualized 1.7 x 1.2 cm subcutaneous nodule in the left gluteal fold. The abdominal wall is normal. Musculoskeletal. No acute displaced fractures. Review of the MIP images confirms the above findings. IMPRESSION: 1. No acute thoracic, abdominal or pelvic injury. 2. Cardiomegaly with significant wall thickening of the left ventricle. 3. Trace right-sided pleural effusion. 4. Dilated main pulmonary artery which can be seen in patients with elevated pulmonary artery pressures. 5. Partially visualized 1.7 x 1.2 cm subcutaneous nodule in the left gluteal fold. Recommend correlation with physical examination. This could represent a sebaceous cyst. Aortic Atherosclerosis (ICD10-I70.0). Electronically Signed   By: Constance Holster M.D.   On: 05/19/2019 11:06   Ct Abdomen Pelvis W Contrast  Result Date: 05/19/2019 CLINICAL DATA:  Bilateral lower extremity pain and shortness of breath. EXAM: CT ANGIOGRAPHY CHEST CT ABDOMEN AND PELVIS WITH CONTRAST TECHNIQUE: Multidetector CT imaging of the chest was performed using the standard protocol during bolus administration of intravenous contrast. Multiplanar CT image reconstructions and MIPs were obtained to evaluate the vascular anatomy. Multidetector CT imaging of the abdomen and pelvis was performed using the standard protocol during bolus administration of intravenous contrast. CONTRAST:  14mL OMNIPAQUE IOHEXOL 350 MG/ML SOLN COMPARISON:  CT  dated 04/18/2018 FINDINGS: CTA CHEST FINDINGS Cardiovascular: Contrast injection is sufficient to demonstrate satisfactory opacification of the pulmonary arteries to the segmental level. There is no pulmonary embolus. The main pulmonary artery is dilated measuring approximately 3.8 cm in diameter. There is no CT evidence of acute right heart strain. There are mild atherosclerotic changes of the visualized thoracic aorta. Heart size is enlarged. There is significant wall thickening of the left ventricle. A dual chamber  pacemaker is in place. There is no significant pericardial effusion. Mediastinum/Nodes: --No mediastinal or hilar lymphadenopathy. --No axillary lymphadenopathy. --No supraclavicular lymphadenopathy. --a large left-sided thyroid nodule is again noted. --The esophagus is unremarkable Lungs/Pleura: The lung volumes are low. The trachea is unremarkable. There is a trace right-sided pleural effusion. There is no pneumothorax or focal infiltrate. There is a small amount of atelectasis in the lingula. Musculoskeletal: No chest wall abnormality. No acute or significant osseous findings. Review of the MIP images confirms the above findings. CT ABDOMEN and PELVIS FINDINGS Hepatobiliary: The liver is normal. Status post cholecystectomy.There is no biliary ductal dilation. Pancreas: Normal contours without ductal dilatation. No peripancreatic fluid collection. Spleen: No splenic laceration or hematoma. Adrenals/Urinary Tract: --Adrenal glands: No adrenal hemorrhage. --Right kidney/ureter: No hydronephrosis or perinephric hematoma. --Left kidney/ureter: No hydronephrosis or perinephric hematoma. --Urinary bladder: Unremarkable. Stomach/Bowel: --Stomach/Duodenum: No hiatal hernia or other gastric abnormality. Normal duodenal course and caliber. --Small bowel: No dilatation or inflammation. --Colon: No focal abnormality. --Appendix: Normal. Vascular/Lymphatic: Atherosclerotic calcification is present within the non-aneurysmal abdominal aorta, without hemodynamically significant stenosis. --No retroperitoneal lymphadenopathy. --No mesenteric lymphadenopathy. --No pelvic or inguinal lymphadenopathy. Reproductive: Unremarkable Other: There is a partially visualized 1.7 x 1.2 cm subcutaneous nodule in the left gluteal fold. The abdominal wall is normal. Musculoskeletal. No acute displaced fractures. Review of the MIP images confirms the above findings. IMPRESSION: 1. No acute thoracic, abdominal or pelvic injury. 2. Cardiomegaly with  significant wall thickening of the left ventricle. 3. Trace right-sided pleural effusion. 4. Dilated main pulmonary artery which can be seen in patients with elevated pulmonary artery pressures. 5. Partially visualized 1.7 x 1.2 cm subcutaneous nodule in the left gluteal fold. Recommend correlation with physical examination. This could represent a sebaceous cyst. Aortic Atherosclerosis (ICD10-I70.0). Electronically Signed   By: Constance Holster M.D.   On: 05/19/2019 11:06      ASSESSMENT/PLAN    SOB and DOE due to worsening PAH and HOCM - patient reports recurrent admissions for same episodes over past few yrs  - she states when fluid builds up she has to come in  - Mosaic attenuation on CT chest suggestive of PAH vs pulmonary congestion - never smoking status , unclear COPD dx   - she does feel relief from albuterol  - patient endorses some symptoms of anxiety -PH is likely post capillary Group 2 - she is currently NYHA/WHO 3-4 - would recommend RHC for full evaluation and tx , can be done on outpatient -cxr in am    Obstructive sleep apnea **diagnostic sleep study 10/02/2017>>  PLM index of 132, PLM arousal index of 13.7.  Mild sleep apnea with an AHI of 7.1.  CPAP titration study was recommended as well as work-up for periodic limb movement disorder. **CPAP titration study 12/05/2017>> PLM index 65, PLM arousal index of 2.  Hypopnea index remained elevated above 10 and all pressures until a pressure of 10 was achieved and AHI was 0. CPAP of 10 was recommended.  -patient did not wear consistently.  Thank you for allowing me to participate in the care of this patient.    Patient/Family are satisfied with care plan and all questions have been answered.  This document was prepared using Dragon voice recognition software and may include unintentional dictation errors.     Ottie Glazier, M.D.  Division of [facility]

## 2019-05-24 NOTE — Progress Notes (Signed)
Notify Dr. Anselm Jungling about patient's orthostatics VSS reading, has a 10 point drop from lying to sitting, charted in the vitals signs. Dr. Rockey Situ was also notified, he is holding lasix for now. RN will continue to monitor.

## 2019-05-24 NOTE — Progress Notes (Signed)
[facility] NAME: Julie Bender    MR#:  [id number]  DATE OF BIRTH:  Sep 22, 1960  SUBJECTIVE:  CHIEF COMPLAINT:   Chief Complaint  Patient presents with  . Shortness of Breath  . Chest Pain   The patient has better chest pain, still have shortness of breath.  Had very good diuresis ( 4 ltr) after receiving IV Lasix yesterday.  REVIEW OF SYSTEMS:  Review of Systems  Constitutional: Negative for chills, fever and malaise/fatigue.  HENT: Negative for sore throat.   Eyes: Negative for blurred vision and double vision.  Respiratory: Positive for shortness of breath. Negative for cough, hemoptysis, wheezing and stridor.   Cardiovascular: Positive for chest pain. Negative for palpitations, orthopnea and leg swelling.  Gastrointestinal: Negative for abdominal pain, blood in stool, diarrhea, melena, nausea and vomiting.  Genitourinary: Negative for dysuria, flank pain and hematuria.  Musculoskeletal: Negative for back pain and joint pain.  Neurological: Negative for dizziness, sensory change, focal weakness, seizures, loss of consciousness, weakness and headaches.  Endo/Heme/Allergies: Negative for polydipsia.  Psychiatric/Behavioral: Negative for depression. The patient is not nervous/anxious.     DRUG ALLERGIES:  No Known Allergies VITALS:  Blood pressure 120/63, pulse 63, temperature 98.3 F (36.8 C), temperature source Oral, resp. rate 18, height 5\' 4"  (1.626 m), weight 96.6 kg, SpO2 94 %. PHYSICAL EXAMINATION:  Physical Exam Constitutional:      General: She is not in acute distress.    Appearance: Normal appearance. She is obese.  HENT:     Head: Normocephalic.  Eyes:     General: No scleral icterus.    Conjunctiva/sclera: Conjunctivae normal.     Pupils: Pupils are equal, round, and reactive to light.  Neck:     Musculoskeletal: Normal range of motion and neck supple.     Vascular: No JVD.     Trachea: No tracheal  deviation.  Cardiovascular:     Rate and Rhythm: Normal rate and regular rhythm.     Heart sounds: Normal heart sounds. No murmur. No gallop.   Pulmonary:     Effort: Pulmonary effort is normal. No respiratory distress.     Breath sounds: Normal breath sounds. No wheezing or rales.  Chest:     Chest wall: Tenderness present.  Abdominal:     General: Bowel sounds are normal. There is no distension.     Palpations: Abdomen is soft.     Tenderness: There is abdominal tenderness. There is no rebound.  Musculoskeletal: Normal range of motion.        General: No tenderness.     Right lower leg: No edema.     Left lower leg: No edema.  Skin:    Findings: No erythema or rash.  Neurological:     General: No focal deficit present.     Mental Status: She is alert and oriented to person, place, and time.     Cranial Nerves: No cranial nerve deficit.  Psychiatric:        Mood and Affect: Mood normal.    LABORATORY PANEL:  Female CBC Recent Labs  Lab 05/23/19 0707  WBC 7.2  HGB 10.5*  HCT 32.6*  PLT 304   ------------------------------------------------------------------------------------------------------------------ Chemistries  Recent Labs  Lab 05/24/19 0843  NA 138  K 3.7  CL 104  CO2 24  GLUCOSE 176*  BUN 18  CREATININE 0.92  CALCIUM 9.2   RADIOLOGY:  No results found. ASSESSMENT AND PLAN:   *  Atypical chest pain and shortness of breath on exertion, acute diastolic congestive heart failure. Continue aspirin, Coreg and statin.  Nitro PRN.   Echocardiogram: Left ventricular ejection fraction, by visual estimation, is >75%. The left ventricle has hyperdynamic function. There is severely increased left ventricular hypertrophy. Per Dr. Saunders Revel, increase her torsemide to 30 mg BID and also schedule albuterol nebs Q4 hour; decreased carvedilol and increased verapamil.  Consultation with pulmonary regarding possible obstructive lung disease and/or upper airway dysfunction (e.g.  vocal cord dysfunction). She had total 14 L diuresis during this hospital stay in last few days. Continue further diuresis as advised by cardiologist for now.  *  COPD.  Stable.  Nebulizer as needed.  Continue inhalers.  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  Continue patient's long-acting insulin and pre-meal insulin.  Sliding scale insulin ordered.  Diabetic diet.   * Hypertension.  Continue home medications.  Controlled.  *Chronic diastolic CHF.  No signs of fluid overload.  Continue torsemide.  *Gastritis/GERD.  Epigastric pain after eating.  on PPI.  All the records are reviewed and case discussed with Care Management/Social Worker. Management plans discussed with the patient, family and they are in agreement.  CODE STATUS: Full Code  TOTAL TIME TAKING CARE OF THIS PATIENT: 36 minutes.   More than 50% of the time was spent in counseling/coordination of care: YES  POSSIBLE D/C IN 1-2 DAYS, DEPENDING ON CLINICAL CONDITION.   Vaughan Basta M.D on 05/24/2019 at 4:23 PM  Between 7am to 6pm - Pager - [phone]  After 6pm go to [email or web address] - [facility]

## 2019-05-25 ENCOUNTER — Inpatient Hospital Stay: Payer: Medicare Other

## 2019-05-25 DIAGNOSIS — I5033 Acute on chronic diastolic (congestive) heart failure: Secondary | ICD-10-CM

## 2019-05-25 LAB — GLUCOSE, CAPILLARY
Glucose-Capillary: 161 mg/dL — ABNORMAL HIGH (ref 70–99)
Glucose-Capillary: 254 mg/dL — ABNORMAL HIGH (ref 70–99)
Glucose-Capillary: 291 mg/dL — ABNORMAL HIGH (ref 70–99)
Glucose-Capillary: 190 mg/dL — ABNORMAL HIGH (ref 70–99)

## 2019-05-25 LAB — BASIC METABOLIC PANEL
Anion gap: 9 (ref 5–15)
BUN: 19 mg/dL (ref 6–20)
CO2: 24 mmol/L (ref 22–32)
Calcium: 8.8 mg/dL — ABNORMAL LOW (ref 8.9–10.3)
Chloride: 107 mmol/L (ref 98–111)
Creatinine, Ser: 0.87 mg/dL (ref 0.44–1.00)
GFR calc Af Amer: >60 mL/min (ref >60)
GFR calc non Af Amer: >60 mL/min (ref >60)
Glucose, Bld: 181 mg/dL — ABNORMAL HIGH (ref 70–99)
Potassium: 3.8 mmol/L (ref 3.5–5.1)
Sodium: 140 mmol/L (ref 135–145)

## 2019-05-25 MED ORDER — LIVING WELL WITH DIABETES BOOK
Freq: Once | Status: AC
  Administered 2019-05-25: 16:00:00
  Filled 2019-05-25 (×2): qty 1

## 2019-05-25 MED ORDER — METOLAZONE 2.5 MG PO TABS
2.5000 mg | ORAL_TABLET | Freq: Every day | ORAL | Status: DC | PRN
  Filled 2019-05-25: qty 1

## 2019-05-25 MED ORDER — TORSEMIDE 20 MG PO TABS
40.0000 mg | ORAL_TABLET | Freq: Two times a day (BID) | ORAL | Status: DC
  Administered 2019-05-25 – 2019-05-26 (×2): 40 mg via ORAL
  Filled 2019-05-25 (×2): qty 2

## 2019-05-25 MED ORDER — TORSEMIDE 20 MG PO TABS: 40.0000 mg | ORAL_TABLET | Freq: Two times a day (BID) | ORAL | Status: DC

## 2019-05-25 NOTE — Plan of Care (Signed)
Nutrition Education Note  RD consulted for nutrition education regarding new onset CHF.  58 y.o. female with history of nonobstructive CAD, HCM s/p ICD previously followed by [facility], HFpEF, hypertensive heart disease, mild to moderate mitral regurgitation, thyroid nodule, IDDM, HTN, obesity, OSA, and COPD admitted with SOB.   RD provided "Low Sodium Nutrition Therapy" handout from the Academy of [employer] and Dietetics. Reviewed patient's dietary recall. Provided examples on ways to decrease sodium intake in diet. Discouraged intake of processed foods and use of salt shaker. Encouraged fresh fruits and vegetables as well as whole grain sources of carbohydrates to maximize fiber intake.   RD discussed why it is important for patient to adhere to diet recommendations, and emphasized the role of fluids, foods to avoid, and importance of weighing self daily. Teach back method used.  Expect good compliance.  Body mass index is 36.77 kg/m. Pt meets criteria for obesity based on current BMI.  Current diet order is HH/CHO, patient is consuming approximately 100% of meals at this time. Labs and medications reviewed. No further nutrition interventions warranted at this time. RD contact information provided. If additional nutrition issues arise, please re-consult RD.   Koleen Distance MS, RD, LDN Pager #- [phone] Office#- [phone] After Hours Pager: [phone]

## 2019-05-25 NOTE — Progress Notes (Signed)
Pulmonary Medicine          Date: 05/25/2019,   MRN# [id number] [place] August 25, 1960     AdmissionWeight: 97.1 kg                 CurrentWeight: 97.2 kg      CHIEF COMPLAINT:   Severe shortness of breath   Subjective   Patient reports improved shortness of breath and chest discomfort.  Diuresed well overnight.   BP is stable, net negative >16L, was able to walk with PT for first time today.  Should be ready for d/c home in am.   Needs to have asthma and OSA optimized on outpatient basis in pulm clinic.     PAST MEDICAL HISTORY   Past Medical History:  Diagnosis Date  . CHF (congestive heart failure) ([place])   . COPD (chronic obstructive pulmonary disease) ([place])   . Diabetes mellitus without complication ([place])   . Diabetic retinopathy ([place])   . History of placement of internal cardiac defibrillator   . Hypertension   . Hypertrophic cardiomyopathy ([place])      SURGICAL HISTORY   Past Surgical History:  Procedure Laterality Date  . CARDIAC DEFIBRILLATOR PLACEMENT    . CHOLECYSTECTOMY       FAMILY HISTORY   Family History  Problem Relation Age of Onset  . Hypertrophic cardiomyopathy Sister      SOCIAL HISTORY   Social History   Tobacco Use  . Smoking status: Passive Smoke Exposure - Never Smoker  . Smokeless tobacco: Never Used  Substance Use Topics  . Alcohol use: No  . Drug use: Never     MEDICATIONS    Home Medication:  Current Outpatient Rx  . Order #: [id number] Class: Print    Current Medication:  Current Facility-Administered Medications:  .  0.9 %  sodium chloride infusion, , Intravenous, PRN, Vaughan Basta, MD, Last Rate: 5 mL/hr at 05/24/19 2018, 250 mL at 05/24/19 2018 .  acetaminophen (TYLENOL) tablet 650 mg, 650 mg, Oral, Q6H PRN, 650 mg at 05/20/19 1320 **OR** acetaminophen (TYLENOL) suppository 650 mg, 650 mg, Rectal, Q6H PRN, Sudini, Srikar, MD .  albuterol (PROVENTIL) (2.5 MG/3ML) 0.083% nebulizer  solution 2.5 mg, 2.5 mg, Nebulization, Q6H, Vaughan Basta, MD, 2.5 mg at 05/25/19 0858 .  alum & mag hydroxide-simeth (MAALOX/MYLANTA) 200-200-20 MG/5ML suspension 30 mL, 30 mL, Oral, Q6H PRN, Demetrios Loll, MD .  aspirin EC tablet 81 mg, 81 mg, Oral, Daily, Sudini, Srikar, MD, 81 mg at 05/25/19 [id number] .  atorvastatin (LIPITOR) tablet 40 mg, 40 mg, Oral, q1800, Sudini, Srikar, MD, 40 mg at 05/24/19 1721 .  bisacodyl (DULCOLAX) EC tablet 5 mg, 5 mg, Oral, Daily PRN, Demetrios Loll, MD, 5 mg at 05/24/19 0856 .  budesonide (PULMICORT) nebulizer solution 0.25 mg, 0.25 mg, Nebulization, BID, Lanney Gins, Teryl Mcconaghy, MD, 0.25 mg at 05/25/19 0859 .  citalopram (CELEXA) tablet 20 mg, 20 mg, Oral, Daily, Demetrios Loll, MD, 20 mg at 05/25/19 [id number] .  enoxaparin (LOVENOX) injection 40 mg, 40 mg, Subcutaneous, Q24H, Sudini, Srikar, MD, 40 mg at 05/24/19 2201 .  gabapentin (NEURONTIN) capsule 300 mg, 300 mg, Oral, BID, Sudini, Srikar, MD, 300 mg at 05/25/19 [id number] .  insulin aspart (novoLOG) injection 0-15 Units, 0-15 Units, Subcutaneous, TID WC, Hillary Bow, MD, 3 Units at 05/25/19 [phone] .  insulin aspart (novoLOG) injection 0-5 Units, 0-5 Units, Subcutaneous, QHS, Hillary Bow, MD, 3 Units at 05/23/19 2215 .  insulin aspart (novoLOG) injection 10 Units, 10 Units, Subcutaneous,  TID WC, Vaughan Basta, MD, 10 Units at 05/25/19 [phone] .  insulin detemir (LEVEMIR) injection 48 Units, 48 Units, Subcutaneous, QHS, Vaughan Basta, MD, 48 Units at 05/24/19 2233 .  metoprolol succinate (TOPROL-XL) 24 hr tablet 25 mg, 25 mg, Oral, Daily, Agbor-Etang, Aaron Edelman, MD, 25 mg at 05/25/19 [id number] .  nitroGLYCERIN (NITROSTAT) SL tablet 0.4 mg, 0.4 mg, Sublingual, Q5 min PRN, Sudini, Srikar, MD .  ondansetron (ZOFRAN) tablet 4 mg, 4 mg, Oral, Q6H PRN **OR** ondansetron (ZOFRAN) injection 4 mg, 4 mg, Intravenous, Q6H PRN, Sudini, Srikar, MD .  oxyCODONE-acetaminophen (PERCOCET/ROXICET) 5-325 MG per tablet 1 tablet, 1 tablet, Oral,  Q6H PRN, Hillary Bow, MD, 1 tablet at 05/24/19 2208 .  pantoprazole (PROTONIX) EC tablet 40 mg, 40 mg, Oral, BID AC, Demetrios Loll, MD, 40 mg at 05/25/19 [id number] .  polyethylene glycol (MIRALAX / GLYCOLAX) packet 17 g, 17 g, Oral, Daily PRN, Hillary Bow, MD, 17 g at 05/21/19 2011 .  sodium chloride flush (NS) 0.9 % injection 3 mL, 3 mL, Intravenous, Q12H, Sudini, Srikar, MD, 3 mL at 05/25/19 0823 .  torsemide (DEMADEX) tablet 20 mg, 20 mg, Oral, Daily, Avriana Joo, MD, 20 mg at 05/25/19 [id number] .  traMADol (ULTRAM) tablet 50 mg, 50 mg, Oral, Q6H PRN, Hillary Bow, MD, 50 mg at 05/20/19 2122 .  traZODone (DESYREL) tablet 150 mg, 150 mg, Oral, QHS, Sudini, Srikar, MD, 150 mg at 05/24/19 2201 .  verapamil (CALAN-SR) CR tablet 240 mg, 240 mg, Oral, Daily, End, Christopher, MD, 240 mg at 05/24/19 0856    ALLERGIES   Patient has no known allergies.     REVIEW OF SYSTEMS    Review of Systems:  Gen:  Denies  fever, sweats, chills weigh loss  HEENT: Denies blurred vision, double vision, ear pain, eye pain, hearing loss, nose bleeds, sore throat Cardiac:  No dizziness, chest pain or heaviness, chest tightness,edema Resp:   Denies cough or sputum porduction, shortness of breath,wheezing, hemoptysis,  Gi: Denies swallowing difficulty, stomach pain, nausea or vomiting, diarrhea, constipation, bowel incontinence Gu:  Denies bladder incontinence, burning urine Ext:   Denies Joint pain, stiffness or swelling Skin: Denies  skin rash, easy bruising or bleeding or hives Endoc:  Denies polyuria, polydipsia , polyphagia or weight change Psych:   Denies depression, insomnia or hallucinations   Other:  All other systems negative   VS: BP (!) 126/58 (BP Location: Left Arm)   Pulse 64   Temp 97.8 F (36.6 C) (Oral)   Resp 16   Ht 5\' 4"  (1.626 m)   Wt 97.2 kg   SpO2 93%   BMI 36.77 kg/m      PHYSICAL EXAM    GENERAL:NAD, no fevers, chills, no weakness no fatigue HEAD: Normocephalic,  atraumatic.  EYES: Pupils equal, round, reactive to light. Extraocular muscles intact. No scleral icterus.  MOUTH: Moist mucosal membrane. Dentition intact. No abscess noted.  EAR, NOSE, THROAT: Clear without exudates. No external lesions.  NECK: Supple. No thyromegaly. No nodules. No JVD.  PULMONARY: mild rhonchi with no crackles CARDIOVASCULAR: S1 and S2. Regular rate and rhythm. No murmurs, rubs, or gallops. No edema. Pedal pulses 2+ bilaterally.  GASTROINTESTINAL: Soft, nontender, nondistended. No masses. Positive bowel sounds. No hepatosplenomegaly.  MUSCULOSKELETAL: No swelling, clubbing, or edema. Range of motion full in all extremities.  NEUROLOGIC: Cranial nerves II through XII are intact. No gross focal neurological deficits. Sensation intact. Reflexes intact.  SKIN: No ulceration, lesions, rashes, or cyanosis. Skin warm and dry. Turgor intact.  PSYCHIATRIC: Mood, affect within normal limits. The patient is awake, alert and oriented x 3. Insight, judgment intact.       IMAGING    Dg Chest 2 View  Result Date: 05/19/2019 CLINICAL DATA:  Bilateral lower extremity pain. Shortness of breath. EXAM: CHEST - 2 VIEW COMPARISON:  02/23/2019 FINDINGS: There is mild bilateral interstitial thickening. There is no focal consolidation. There is no pleural effusion or pneumothorax. There is stable cardiomegaly. There is a dual lead cardiac pacemaker. There is no acute osseous abnormality. IMPRESSION: Cardiomegaly with mild pulmonary vascular congestion. Electronically Signed   By: Kathreen Devoid   On: 05/19/2019 09:22   Ct Angio Chest Pe W And/or Wo Contrast  Result Date: 05/19/2019 CLINICAL DATA:  Bilateral lower extremity pain and shortness of breath. EXAM: CT ANGIOGRAPHY CHEST CT ABDOMEN AND PELVIS WITH CONTRAST TECHNIQUE: Multidetector CT imaging of the chest was performed using the standard protocol during bolus administration of intravenous contrast. Multiplanar CT image reconstructions and  MIPs were obtained to evaluate the vascular anatomy. Multidetector CT imaging of the abdomen and pelvis was performed using the standard protocol during bolus administration of intravenous contrast. CONTRAST:  123mL OMNIPAQUE IOHEXOL 350 MG/ML SOLN COMPARISON:  CT dated 04/18/2018 FINDINGS: CTA CHEST FINDINGS Cardiovascular: Contrast injection is sufficient to demonstrate satisfactory opacification of the pulmonary arteries to the segmental level. There is no pulmonary embolus. The main pulmonary artery is dilated measuring approximately 3.8 cm in diameter. There is no CT evidence of acute right heart strain. There are mild atherosclerotic changes of the visualized thoracic aorta. Heart size is enlarged. There is significant wall thickening of the left ventricle. 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 There is no significant pericardial effusion. Mediastinum/Nodes: --No mediastinal or hilar lymphadenopathy. --No axillary lymphadenopathy. --No supraclavicular lymphadenopathy. --a large left-sided thyroid nodule is again noted. --The esophagus is unremarkable Lungs/Pleura: The lung volumes are low. The trachea is unremarkable. There is a trace right-sided pleural effusion. There is no pneumothorax or focal infiltrate. There is a small amount of atelectasis in the lingula. Musculoskeletal: No chest wall abnormality. No acute or significant osseous findings. Review of the MIP images confirms the above findings. CT ABDOMEN and PELVIS FINDINGS Hepatobiliary: The liver is normal. Status post cholecystectomy.There is no biliary ductal dilation. Pancreas: Normal contours without ductal dilatation. No peripancreatic fluid collection. Spleen: No splenic laceration or hematoma. Adrenals/Urinary Tract: --Adrenal glands: No adrenal hemorrhage. --Right kidney/ureter: No hydronephrosis or perinephric hematoma. --Left kidney/ureter: No hydronephrosis or perinephric hematoma. --Urinary bladder: Unremarkable. Stomach/Bowel:  --Stomach/Duodenum: No hiatal hernia or other gastric abnormality. Normal duodenal course and caliber. --Small bowel: No dilatation or inflammation. --Colon: No focal abnormality. --Appendix: Normal. Vascular/Lymphatic: Atherosclerotic calcification is present within the non-aneurysmal abdominal aorta, without hemodynamically significant stenosis. --No retroperitoneal lymphadenopathy. --No mesenteric lymphadenopathy. --No pelvic or inguinal lymphadenopathy. Reproductive: Unremarkable Other: There is a partially visualized 1.7 x 1.2 cm subcutaneous nodule in the left gluteal fold. The abdominal wall is normal. Musculoskeletal. No acute displaced fractures. Review of the MIP images confirms the above findings. IMPRESSION: 1. No acute thoracic, abdominal or pelvic injury. 2. Cardiomegaly with significant wall thickening of the left ventricle. 3. Trace right-sided pleural effusion. 4. Dilated main pulmonary artery which can be seen in patients with elevated pulmonary artery pressures. 5. Partially visualized 1.7 x 1.2 cm subcutaneous nodule in the left gluteal fold. Recommend correlation with physical examination. This could represent a sebaceous cyst. Aortic Atherosclerosis (ICD10-I70.0). Electronically Signed   By: Jamie Kato.D.  On: 05/19/2019 11:06   Ct Abdomen Pelvis W Contrast  Result Date: 05/19/2019 CLINICAL DATA:  Bilateral lower extremity pain and shortness of breath. EXAM: CT ANGIOGRAPHY CHEST CT ABDOMEN AND PELVIS WITH CONTRAST TECHNIQUE: Multidetector CT imaging of the chest was performed using the standard protocol during bolus administration of intravenous contrast. Multiplanar CT image reconstructions and MIPs were obtained to evaluate the vascular anatomy. Multidetector CT imaging of the abdomen and pelvis was performed using the standard protocol during bolus administration of intravenous contrast. CONTRAST:  152mL OMNIPAQUE IOHEXOL 350 MG/ML SOLN COMPARISON:  CT dated 04/18/2018  FINDINGS: CTA CHEST FINDINGS Cardiovascular: Contrast injection is sufficient to demonstrate satisfactory opacification of the pulmonary arteries to the segmental level. There is no pulmonary embolus. The main pulmonary artery is dilated measuring approximately 3.8 cm in diameter. There is no CT evidence of acute right heart strain. There are mild atherosclerotic changes of the visualized thoracic aorta. Heart size is enlarged. There is significant wall thickening of the left ventricle. 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 There is no significant pericardial effusion. Mediastinum/Nodes: --No mediastinal or hilar lymphadenopathy. --No axillary lymphadenopathy. --No supraclavicular lymphadenopathy. --a large left-sided thyroid nodule is again noted. --The esophagus is unremarkable Lungs/Pleura: The lung volumes are low. The trachea is unremarkable. There is a trace right-sided pleural effusion. There is no pneumothorax or focal infiltrate. There is a small amount of atelectasis in the lingula. Musculoskeletal: No chest wall abnormality. No acute or significant osseous findings. Review of the MIP images confirms the above findings. CT ABDOMEN and PELVIS FINDINGS Hepatobiliary: The liver is normal. Status post cholecystectomy.There is no biliary ductal dilation. Pancreas: Normal contours without ductal dilatation. No peripancreatic fluid collection. Spleen: No splenic laceration or hematoma. Adrenals/Urinary Tract: --Adrenal glands: No adrenal hemorrhage. --Right kidney/ureter: No hydronephrosis or perinephric hematoma. --Left kidney/ureter: No hydronephrosis or perinephric hematoma. --Urinary bladder: Unremarkable. Stomach/Bowel: --Stomach/Duodenum: No hiatal hernia or other gastric abnormality. Normal duodenal course and caliber. --Small bowel: No dilatation or inflammation. --Colon: No focal abnormality. --Appendix: Normal. Vascular/Lymphatic: Atherosclerotic calcification is present within the non-aneurysmal  abdominal aorta, without hemodynamically significant stenosis. --No retroperitoneal lymphadenopathy. --No mesenteric lymphadenopathy. --No pelvic or inguinal lymphadenopathy. Reproductive: Unremarkable Other: There is a partially visualized 1.7 x 1.2 cm subcutaneous nodule in the left gluteal fold. The abdominal wall is normal. Musculoskeletal. No acute displaced fractures. Review of the MIP images confirms the above findings. IMPRESSION: 1. No acute thoracic, abdominal or pelvic injury. 2. Cardiomegaly with significant wall thickening of the left ventricle. 3. Trace right-sided pleural effusion. 4. Dilated main pulmonary artery which can be seen in patients with elevated pulmonary artery pressures. 5. Partially visualized 1.7 x 1.2 cm subcutaneous nodule in the left gluteal fold. Recommend correlation with physical examination. This could represent a sebaceous cyst. Aortic Atherosclerosis (ICD10-I70.0). Electronically Signed   By: Constance Holster M.D.   On: 05/19/2019 11:06           ASSESSMENT/PLAN    SOB and DOE due to worsening PAH and HOCM - patient reports recurrent admissions for same episodes over past few yrs with wheezing that responds to albuterol.  She has a personal history of asthma which seems to be playing a role with DOE/SOB concomitantly with complicated cardiac process.  - Mosaic attenuation on CT chest suggestive of intestitial edema vs reactive airway disease - never smoking status , unclear COPD dx   - she does feel relief from albuterol  - patient endorses some symptoms of anxiety -PH is likely post  capillary Group 2 - she is currently NYHA/WHO 3-4 - would recommend RHC for full evaluation and tx , can be done on outpatient -cxr improved    Obstructive sleep apnea **diagnostic sleep study 10/02/2017>>  PLM index of 132, PLM arousal index of 13.7.  Mild sleep apnea with an AHI of 7.1.  CPAP titration study was recommended as well as work-up for periodic limb movement  disorder. **CPAP titration study 12/05/2017>> PLM index 65, PLM arousal index of 2.  Hypopnea index remained elevated above 10 and all pressures until a pressure of 10 was achieved and AHI was 0. CPAP of 10 was recommended.  -patient did not wear consistently.        Thank you for allowing me to participate in the care of this patient.    Patient/Family are satisfied with care plan and all questions have been answered.  This document was prepared using Dragon voice recognition software and may include unintentional dictation errors.     Ottie Glazier, M.D.  Division of [place]

## 2019-05-25 NOTE — Progress Notes (Signed)
Physical Therapy Treatment Patient Details Name: Julie Bender MRN: [id number] DOB: 1961/01/04 Today's Date: 05/25/2019    History of Present Illness Julie Bender is a 58 y.o. female who presented to the hospital ED on 05/19/2019 with complaints of shortness of breath and L chest pain nas well as bilateral LE and tingling and mubness in hands and feet which is chronic. Patient addmitted with diagnosis of chest pain. Cardiology consult reported troponin chronically elevated.  Relevant PMH includes COPD, HTN, DMII, hypertrophic cardiomyopathy, diastolic CHF, history of heart catheterization.    PT Comments    In bed, feeling better.  Stated she has walked to bathroom on her own with some SOB but no dizziness.  Orthostatic BP's taken - documented in flow sheets.  She did decrease in standing, but increased back to baseline at 3 minutes.  No dizziness noted.  She was able to progress gait in hallway to 66' with RW and generally steady.  No dizziness noted but limited by general fatigue.  Sats on room air remains 92-96% during session.  Pt comfortable with gait.   Follow Up Recommendations  Supervision for mobility/OOB;Home health PT     Equipment Recommendations  Rolling walker with 5" wheels;3in1 (PT)    Recommendations for Other Services       Precautions / Restrictions Precautions Precautions: Fall Precaution Comments: blind in L eye Restrictions Weight Bearing Restrictions: No Other Position/Activity Restrictions: check orthostatics    Mobility  Bed Mobility Overal bed mobility: Modified Independent                Transfers Overall transfer level: Modified independent                  Ambulation/Gait Ambulation/Gait assistance: Supervision Gait Distance (Feet): 100 Feet Assistive device: Rolling walker (2 wheeled) Gait Pattern/deviations: Step-through pattern Gait velocity: decreased   General Gait Details: steady with no c/o dizziness, limited by general  fatigue and some SOB but no LOB or buckling   Stairs             Wheelchair Mobility    Modified Rankin (Stroke Patients Only)       Balance Overall balance assessment: Needs assistance Sitting-balance support: Feet supported Sitting balance-Leahy Scale: Good     Standing balance support: Bilateral upper extremity supported Standing balance-Leahy Scale: Good                              Cognition Arousal/Alertness: Awake/alert Behavior During Therapy: WFL for tasks assessed/performed Overall Cognitive Status: Within Functional Limits for tasks assessed                                        Exercises      General Comments        Pertinent Vitals/Pain Pain Assessment: No/denies pain    Home Living                      Prior Function            PT Goals (current goals can now be found in the care plan section) Progress towards PT goals: Progressing toward goals    Frequency    Min 2X/week      PT Plan Current plan remains appropriate    Co-evaluation  AM-PAC PT "6 Clicks" Mobility   Outcome Measure  Help needed turning from your back to your side while in a flat bed without using bedrails?: None Help needed moving from lying on your back to sitting on the side of a flat bed without using bedrails?: None Help needed moving to and from a bed to a chair (including a wheelchair)?: None Help needed standing up from a chair using your arms (e.g., wheelchair or bedside chair)?: None Help needed to walk in hospital room?: A Little Help needed climbing 3-5 steps with a railing? : A Little 6 Click Score: 22    End of Session Equipment Utilized During Treatment: Gait belt Activity Tolerance: Patient tolerated treatment well Patient left: in bed;with call bell/phone within reach Nurse Communication: Other (comment)       [id number] PT Time Calculation (min) (ACUTE ONLY): 24  min  Charges:  $Gait Training: 23-37 mins                     Chesley Noon, PTA 05/25/19, 2:36 PM

## 2019-05-25 NOTE — Plan of Care (Signed)
Rounded with MD, pt resting in room comfortably, walked to the bathroom with stand by assist, pt stated she still feels short of breath, pt educated on low sodium diet, importance of weighing herself daily, EMI CHF videos showed to pt. Pt eager to learn more about CHF.  Problem: Education: Goal: Knowledge of General Education information will improve Description: Including pain rating scale, medication(s)/side effects and non-pharmacologic comfort measures Outcome: Progressing   Problem: Health Behavior/Discharge Planning: Goal: Ability to manage health-related needs will improve Outcome: Progressing   Problem: Education: Goal: Ability to demonstrate management of disease process will improve Outcome: Progressing Goal: Individualized Educational Video(s) Outcome: Progressing

## 2019-05-25 NOTE — Progress Notes (Signed)
RedsVest reading is 30%

## 2019-05-25 NOTE — TOC Progression Note (Signed)
Transition of Care [facility]) - Progression Note    Patient Details  Name: Julie Bender MRN: [id number] Date of Birth: 09/21/1960  [facility] CM/SW Contact  Cecil Cobbs Phone Number: 05/25/2019, 6:12 PM  Clinical Narrative:    Patient plans to return back home with home health.  CSW spoke to patient and she would like [facility] to see her.  CSW contacted [facility], and they can provide home health PT for her and they can add nursing later.  CSW continuing to follow patient's progress throughout discharge planning.     Expected Discharge Plan and Services  Patient plans to return back home with home health PT.         Expected Discharge Date: 05/20/19                                     Social Determinants of Health ([place]) Interventions    Readmission Risk Interventions Readmission Risk Prevention Plan 04/20/2018  Transportation Screening Complete  PCP or Specialist Appt within 5-7 Days Complete  Home Care Screening Complete  Medication Review (RN CM) Complete

## 2019-05-25 NOTE — Progress Notes (Signed)
Progress Note  Patient Name: Julie Bender Date of Encounter: 05/25/2019  Primary Cardiologist: Rockey Situ  Subjective   Documented urine output 1.8 L for the past 24 hours with a net negative of 16.3 L with minimal input documented.  Weight trend over the past 24 hours 96.6 kg trending to 97.2 kg.  Renal function remains stable.  Overall, the patient continues to note improvement in her exertional dyspnea.  Dizziness has resolved.  No chest pain or palpitations.  Inpatient Medications    Scheduled Meds: . albuterol  2.5 mg Nebulization Q6H  . aspirin EC  81 mg Oral Daily  . atorvastatin  40 mg Oral q1800  . budesonide (PULMICORT) nebulizer solution  0.25 mg Nebulization BID  . citalopram  20 mg Oral Daily  . enoxaparin (LOVENOX) injection  40 mg Subcutaneous Q24H  . gabapentin  300 mg Oral BID  . insulin aspart  0-15 Units Subcutaneous TID WC  . insulin aspart  0-5 Units Subcutaneous QHS  . insulin aspart  10 Units Subcutaneous TID WC  . insulin detemir  48 Units Subcutaneous QHS  . metoprolol succinate  25 mg Oral Daily  . pantoprazole  40 mg Oral BID AC  . sodium chloride flush  3 mL Intravenous Q12H  . torsemide  20 mg Oral Daily  . traZODone  150 mg Oral QHS  . verapamil  240 mg Oral Daily   Continuous Infusions: . sodium chloride 250 mL (05/24/19 2018)   PRN Meds: sodium chloride, acetaminophen **OR** acetaminophen, alum & mag hydroxide-simeth, bisacodyl, nitroGLYCERIN, ondansetron **OR** ondansetron (ZOFRAN) IV, oxyCODONE-acetaminophen, polyethylene glycol, traMADol   Vital Signs    Vitals:   05/25/19 0146 05/25/19 0444 05/25/19 0758 05/25/19 0859  BP:  127/60 (!) 126/58   Pulse:  (!) 58 64   Resp:  20 16   Temp:  98 F (36.7 C) 97.8 F (36.6 C)   TempSrc:  Oral Oral   SpO2: 98% 97% 96% 93%  Weight:  97.2 kg    Height:        Intake/Output Summary (Last 24 hours) at 05/25/2019 1328 Last data filed at 05/25/2019 1019 Gross per 24 hour  Intake 293.84 ml   Output 1500 ml  Net -1206.16 ml   Filed Weights   05/23/19 0328 05/24/19 0319 05/25/19 0444  Weight: 99.3 kg 96.6 kg 97.2 kg    Telemetry    Intermittent A pacing- Personally Reviewed  ECG    No new tracings- Personally Reviewed  Physical Exam   GEN: No acute distress.   Neck: No JVD. Cardiac: RRR, II/VI systolic murmur, no rubs, or gallops.  Respiratory:  Improving breath sounds bilaterally.  GI: Soft, nontender, mildly distended.   MS: No edema; No deformity. Neuro:  Alert and oriented x 3; Nonfocal.  Psych: Normal affect.  Labs    Chemistry Recent Labs  Lab 05/23/19 0707 05/24/19 0843 05/25/19 0510  NA 138 138 140  K 3.8 3.7 3.8  CL 104 104 107  CO2 25 24 24   GLUCOSE 156* 176* 181*  BUN 21* 18 19  CREATININE 0.91 0.92 0.87  CALCIUM 8.8* 9.2 8.8*  GFRNONAA >60 >60 >60  GFRAA >60 >60 >60  ANIONGAP 9 10 9      Hematology Recent Labs  Lab 05/19/19 0835 05/21/19 0845 05/23/19 0707  WBC 7.2 7.3 7.2  RBC 3.79* 3.92 3.92  HGB 10.1* 10.5* 10.5*  HCT 31.9* 32.9* 32.6*  MCV 84.2 83.9 83.2  MCH 26.6 26.8 26.8  MCHC 31.7 31.9 32.2  RDW 15.6* 15.8* 15.6*  PLT 253 287 304    Cardiac EnzymesNo results for input(s): TROPONINI in the last 168 hours. No results for input(s): TROPIPOC in the last 168 hours.   BNP Recent Labs  Lab 05/19/19 0835  BNP 463.0*     DDimer No results for input(s): DDIMER in the last 168 hours.   Radiology    Dg Chest Port 1 View  Result Date: 05/25/2019 IMPRESSION: 1. No significant interval change in the mild diffuse interstitial opacities. 2. A 2.3 cm long radiopacity overlies the cardiac silhouette and may be external to the patient. 3. Stable cardiomegaly. Electronically Signed   By: Zerita Boers M.D.   On: 05/25/2019 11:00    Cardiac Studies   2D Echo9/10/2017: - Left ventricle: The cavity size was normal. There was moderate concentric hypertrophy, septal wall appears to be moderate to severe (apical region  appears to be spared) with mild LVOT obstruction. Systolic function was normal. The estimated ejection fraction was in the range of 55% to 60%. Wall motion was normal; there were no regional wall motion abnormalities. Features are consistent with a pseudonormal left ventricular filling pattern, with concomitant abnormal relaxation and increased filling pressure (grade 2 diastolic dysfunction). - Aortic valve: There was trivial regurgitation. - Mitral valve: There was mild to moderate regurgitation. - Left atrium: The atrium was mildly dilated. - Right ventricle: Systolic function was normal. - Pulmonary arteries: Systolic pressure could not be accurately estimated. Impressions: - Definity used. __________  2D Echo 05/21/2019: 1. Left ventricular ejection fraction, by visual estimation, is >75%. The left ventricle has hyperdynamic function. There is severely increased left ventricular hypertrophy. 2. Definity contrast agent was given IV to delineate the left ventricular endocardial borders. 3. Elevated mean left atrial pressure. 4. Hypertrophic cardiomyopathy. 5. Left ventricular diastolic Doppler parameters are consistent with pseudonormalization pattern of LV diastolic filling. 6. Global right ventricle was not well visualized.The right ventricular size is not well visualized. No increase in right ventricular wall thickness. 7. Left atrial size was mildly dilated. 8. Right atrial size was not well visualized. 9. The pericardium was not well visualized. 10. Mild aortic valve annular calcification. 11. The mitral valve is degenerative. Mild mitral valve regurgitation. 12. The tricuspid valve is not well visualized. Tricuspid valve regurgitation was not visualized by color flow Doppler. 13. The aortic valve was not well visualized Aortic valve regurgitation is trivial by color flow Doppler. Mild aortic valve sclerosis without stenosis. 14. The pulmonic valve was not  well visualized. Pulmonic valve regurgitation is not visualized by color flow Doppler. 15. The inferior vena cava is normal in size with greater than 50% respiratory variability, suggesting right atrial pressure of 3 mmHg. 16. The interatrial septum was not well visualized.  Patient Profile     58 y.o. female with history of nonobstructive CAD, HCM s/p ICD previously followed by [facility], HFpEF, hypertensive heart disease, mild to moderate mitral regurgitation, thyroid nodule, IDDM, HTN, obesity, OSA, and COPD who we are seeing for SOB.  Assessment & Plan    1. Dyspnea: -Continues to improve -Suspect large volume of p.o. fluid intake at home which she has continued to try and undertake while admitted -Likely multifactorial including HCM, AECOPD, anemia, obesity, and physical deconditioning  -Cannot exclude ischemia, though this does seem to be less likelygiven recent nonobstructive cath in 2017 -Symptoms improving -Transition from IV Lasix to torsemide 40 mg twice daily  -Continue nebs per IM -She will need  right and left heart cath in the near future  2. HCM s/p ICD: -Documented net - 16.3 L for the admission with weight trend of 96.6-->97.2 kg over the past 24 hours -Renal function stable -transitioning from IV Lasix to p.o. torsemide 40 mg twice daily -Continue increased dose of verapamil and lower dose Coreg -As an outpatient, she will need to be evaluated by the advanced heart failure clinic initially with consideration for referral to HCM subspecialty -Will need repeat R/LHC at some point in the near future with timing being based on her progression  3. HTN: -Reasonably controlled -Continue current therapy   4. AECOPD: -Nebshave helped with her wheezing significantly -Per IM  For questions or updates, please contact [place] Please consult [email or web address] for contact info under Cardiology/STEMI.    Signed, Christell Faith, PA-C [facility] Pager: [phone]  05/25/2019, 1:28 PM

## 2019-05-25 NOTE — Progress Notes (Addendum)
Inpatient Diabetes Program Recommendations  AACE/ADA: New Consensus Statement on Inpatient Glycemic Control (2015)  Target Ranges:  Prepandial:   less than 140 mg/dL      Peak postprandial:   less than 180 mg/dL (1-2 hours)      Critically ill patients:  140 - 180 mg/dL   Lab Results  Component Value Date   GLUCAP 161 (H) 05/25/2019   HGBA1C 7.2 (H) 05/19/2019    Review of Glycemic Control Results for Julie Bender, Julie Bender (MRN [id number]) as of 05/25/2019 11:52  Ref. Range 05/23/2019 20:52 05/24/2019 11:53 05/24/2019 17:08 05/24/2019 22:18 05/25/2019 08:00  Glucose-Capillary Latest Ref Range: 70 - 99 mg/dL 259 (H) 355 (H) 340 (H) 197 (H) 161 (H)   Home DM Meds: Levemir 42 units QHS                             Novolog 13 units TID with meals                             Metformin 850 mg TID                             Victoza 0.6 mg daily   Current Orders: Levemir 42 units QHS                             Novolog Moderate Correction Scale/ SSI (0-15 units) TID AC + HS                            Novolog 10 units TID with meals  Inpatient Diabetes Program Recommendations:   -Increase Novolog 13 units tid meals Spoke with patient @ length regarding nutrition and answered questions. Ordered Living Well with Diabetes and pt. To review.  Thank you, Nani Gasser. Dalylah Ramey, RN, MSN, CDE  Diabetes Coordinator Inpatient Glycemic Control Team Team Pager [phone] (8am-5pm) 05/25/2019 11:55 AM

## 2019-05-26 LAB — CREATININE, SERUM
Creatinine, Ser: 0.85 mg/dL (ref 0.44–1.00)
GFR calc Af Amer: >60 mL/min (ref >60)
GFR calc non Af Amer: >60 mL/min (ref >60)

## 2019-05-26 LAB — GLUCOSE, CAPILLARY
Glucose-Capillary: 118 mg/dL — ABNORMAL HIGH (ref 70–99)
Glucose-Capillary: 325 mg/dL — ABNORMAL HIGH (ref 70–99)
Glucose-Capillary: 202 mg/dL — ABNORMAL HIGH (ref 70–99)

## 2019-05-26 MED ORDER — GABAPENTIN 300 MG PO CAPS: 300.0000 mg | ORAL_CAPSULE | Freq: Two times a day (BID) | ORAL | 0 refills | Status: DC

## 2019-05-26 MED ORDER — PANTOPRAZOLE SODIUM 40 MG PO TBEC: 40.0000 mg | DELAYED_RELEASE_TABLET | Freq: Two times a day (BID) | ORAL | 0 refills | Status: DC

## 2019-05-26 MED ORDER — TORSEMIDE 20 MG PO TABS: 40.0000 mg | ORAL_TABLET | Freq: Two times a day (BID) | ORAL | 0 refills | Status: DC

## 2019-05-26 MED ORDER — METOLAZONE 2.5 MG PO TABS: 2.5000 mg | ORAL_TABLET | Freq: Every day | ORAL | 0 refills | Status: DC | PRN

## 2019-05-26 MED ORDER — METOPROLOL SUCCINATE ER 25 MG PO TB24: 25.0000 mg | ORAL_TABLET | Freq: Every day | ORAL | 0 refills | Status: DC

## 2019-05-26 NOTE — Progress Notes (Signed)
Pulmonary Medicine          Date: 05/26/2019,   MRN# [id number] Julie Bender 12/25/1960     AdmissionWeight: 97.1 kg                 CurrentWeight: 96.9 kg      CHIEF COMPLAINT:   Severe shortness of breath   Subjective   Patient reports improved shortness of breath and chest discomfort.  Diuresed well overnight.   BP is stable, net negative >18L, was able to walk with PT for first time today.  Should be ready for d/c home in am.   Needs to have asthma and OSA optimized on outpatient basis in pulm clinic.     PAST MEDICAL HISTORY   Past Medical History:  Diagnosis Date   CHF (congestive heart failure) (HCC)    COPD (chronic obstructive pulmonary disease) (HCC)    Diabetes mellitus without complication ([place])    Diabetic retinopathy ([place])    History of placement of internal cardiac defibrillator    Hypertension    Hypertrophic cardiomyopathy ([place])      SURGICAL HISTORY   Past Surgical History:  Procedure Laterality Date   CARDIAC DEFIBRILLATOR PLACEMENT     CHOLECYSTECTOMY       FAMILY HISTORY   Family History  Problem Relation Age of Onset   Hypertrophic cardiomyopathy Sister      SOCIAL HISTORY   Social History   Tobacco Use   Smoking status: Passive Smoke Exposure - Never Smoker   Smokeless tobacco: Never Used  Substance Use Topics   Alcohol use: No   Drug use: Never     MEDICATIONS    Home Medication:  Current Outpatient Rx   Order #: [id number] Class: Print    Current Medication:  Current Facility-Administered Medications:    0.9 %  sodium chloride infusion, , Intravenous, PRN, Vaughan Basta, MD, Last Rate: 5 mL/hr at 05/24/19 2018, 250 mL at 05/24/19 2018   acetaminophen (TYLENOL) tablet 650 mg, 650 mg, Oral, Q6H PRN, 650 mg at 05/20/19 1320 **OR** acetaminophen (TYLENOL) suppository 650 mg, 650 mg, Rectal, Q6H PRN, Sudini, Srikar, MD   albuterol (PROVENTIL) (2.5 MG/3ML) 0.083% nebulizer  solution 2.5 mg, 2.5 mg, Nebulization, Q6H, Vaughan Basta, MD, 2.5 mg at 05/26/19 0750   alum & mag hydroxide-simeth (MAALOX/MYLANTA) 200-200-20 MG/5ML suspension 30 mL, 30 mL, Oral, Q6H PRN, Demetrios Loll, MD   aspirin EC tablet 81 mg, 81 mg, Oral, Daily, Sudini, Srikar, MD, 81 mg at 05/26/19 0914   atorvastatin (LIPITOR) tablet 40 mg, 40 mg, Oral, q1800, Hillary Bow, MD, 40 mg at 05/25/19 1801   bisacodyl (DULCOLAX) EC tablet 5 mg, 5 mg, Oral, Daily PRN, Demetrios Loll, MD, 5 mg at 05/25/19 1036   budesonide (PULMICORT) nebulizer solution 0.25 mg, 0.25 mg, Nebulization, BID, Lanney Gins, Eris Breck, MD, 0.25 mg at 05/26/19 0750   citalopram (CELEXA) tablet 20 mg, 20 mg, Oral, Daily, Demetrios Loll, MD, 20 mg at 05/26/19 0915   enoxaparin (LOVENOX) injection 40 mg, 40 mg, Subcutaneous, Q24H, Sudini, Srikar, MD, 40 mg at 05/25/19 2027   gabapentin (NEURONTIN) capsule 300 mg, 300 mg, Oral, BID, Sudini, Srikar, MD, 300 mg at 05/26/19 0914   insulin aspart (novoLOG) injection 0-15 Units, 0-15 Units, Subcutaneous, TID WC, Sudini, Srikar, MD, 8 Units at 05/25/19 1800   insulin aspart (novoLOG) injection 0-5 Units, 0-5 Units, Subcutaneous, QHS, Sudini, Srikar, MD, 3 Units at 05/23/19 2215   insulin aspart (novoLOG) injection 10 Units, 10 Units, Subcutaneous,  TID WC, Vaughan Basta, MD, 10 Units at 05/25/19 1800   insulin detemir (LEVEMIR) injection 48 Units, 48 Units, Subcutaneous, QHS, Vaughan Basta, MD, 48 Units at 05/25/19 2212   metolazone (ZAROXOLYN) tablet 2.5 mg, 2.5 mg, Oral, Daily PRN, Rockey Situ, Kathlene November, MD   metoprolol succinate (TOPROL-XL) 24 hr tablet 25 mg, 25 mg, Oral, Daily, Agbor-Etang, Aaron Edelman, MD, 25 mg at 05/26/19 0917   nitroGLYCERIN (NITROSTAT) SL tablet 0.4 mg, 0.4 mg, Sublingual, Q5 min PRN, Sudini, Srikar, MD   ondansetron (ZOFRAN) tablet 4 mg, 4 mg, Oral, Q6H PRN **OR** ondansetron (ZOFRAN) injection 4 mg, 4 mg, Intravenous, Q6H PRN, Sudini, Srikar, MD    oxyCODONE-acetaminophen (PERCOCET/ROXICET) 5-325 MG per tablet 1 tablet, 1 tablet, Oral, Q6H PRN, Hillary Bow, MD, 1 tablet at 05/25/19 2354   pantoprazole (PROTONIX) EC tablet 40 mg, 40 mg, Oral, BID AC, Demetrios Loll, MD, 40 mg at 05/26/19 0914   polyethylene glycol (MIRALAX / GLYCOLAX) packet 17 g, 17 g, Oral, Daily PRN, Sudini, Srikar, MD, 17 g at 05/25/19 1036   sodium chloride flush (NS) 0.9 % injection 3 mL, 3 mL, Intravenous, Q12H, Sudini, Srikar, MD, 3 mL at 05/26/19 0916   torsemide (DEMADEX) tablet 40 mg, 40 mg, Oral, BID, Gollan, Kathlene November, MD, 40 mg at 05/26/19 0914   traMADol (ULTRAM) tablet 50 mg, 50 mg, Oral, Q6H PRN, Sudini, Srikar, MD, 50 mg at 05/20/19 2122   traZODone (DESYREL) tablet 150 mg, 150 mg, Oral, QHS, Sudini, Srikar, MD, 150 mg at 05/25/19 2027   verapamil (CALAN-SR) CR tablet 240 mg, 240 mg, Oral, Daily, End, Christopher, MD, 240 mg at 05/26/19 [id number]    ALLERGIES   Patient has no known allergies.     REVIEW OF SYSTEMS    Review of Systems:  Gen:  Denies  fever, sweats, chills weigh loss  HEENT: Denies blurred vision, double vision, ear pain, eye pain, hearing loss, nose bleeds, sore throat Cardiac:  No dizziness, chest pain or heaviness, chest tightness,edema Resp:   Denies cough or sputum porduction, shortness of breath,wheezing, hemoptysis,  Gi: Denies swallowing difficulty, stomach pain, nausea or vomiting, diarrhea, constipation, bowel incontinence Gu:  Denies bladder incontinence, burning urine Ext:   Denies Joint pain, stiffness or swelling Skin: Denies  skin rash, easy bruising or bleeding or hives Endoc:  Denies polyuria, polydipsia , polyphagia or weight change Psych:   Denies depression, insomnia or hallucinations   Other:  All other systems negative   VS: BP (!) 154/68 (BP Location: Left Arm)    Pulse 70    Temp 97.9 F (36.6 C) (Oral)    Resp 14    Ht 5\' 4"  (1.626 m)    Wt 96.9 kg    SpO2 96%    BMI 36.68 kg/m      PHYSICAL  EXAM    GENERAL:NAD, no fevers, chills, no weakness no fatigue HEAD: Normocephalic, atraumatic.  EYES: Pupils equal, round, reactive to light. Extraocular muscles intact. No scleral icterus.  MOUTH: Moist mucosal membrane. Dentition intact. No abscess noted.  EAR, NOSE, THROAT: Clear without exudates. No external lesions.  NECK: Supple. No thyromegaly. No nodules. No JVD.  PULMONARY: mild rhonchi with no crackles CARDIOVASCULAR: S1 and S2. Regular rate and rhythm. No murmurs, rubs, or gallops. No edema. Pedal pulses 2+ bilaterally.  GASTROINTESTINAL: Soft, nontender, nondistended. No masses. Positive bowel sounds. No hepatosplenomegaly.  MUSCULOSKELETAL: No swelling, clubbing, or edema. Range of motion full in all extremities.  NEUROLOGIC: Cranial nerves II through XII are  intact. No gross focal neurological deficits. Sensation intact. Reflexes intact.  SKIN: No ulceration, lesions, rashes, or cyanosis. Skin warm and dry. Turgor intact.  PSYCHIATRIC: Mood, affect within normal limits. The patient is awake, alert and oriented x 3. Insight, judgment intact.       IMAGING    Dg Chest 2 View  Result Date: 05/19/2019 CLINICAL DATA:  Bilateral lower extremity pain. Shortness of breath. EXAM: CHEST - 2 VIEW COMPARISON:  02/23/2019 FINDINGS: There is mild bilateral interstitial thickening. There is no focal consolidation. There is no pleural effusion or pneumothorax. There is stable cardiomegaly. There is a dual lead cardiac pacemaker. There is no acute osseous abnormality. IMPRESSION: Cardiomegaly with mild pulmonary vascular congestion. Electronically Signed   By: Kathreen Devoid   On: 05/19/2019 09:22   Ct Angio Chest Pe W And/or Wo Contrast  Result Date: 05/19/2019 CLINICAL DATA:  Bilateral lower extremity pain and shortness of breath. EXAM: CT ANGIOGRAPHY CHEST CT ABDOMEN AND PELVIS WITH CONTRAST TECHNIQUE: Multidetector CT imaging of the chest was performed using the standard protocol during  bolus administration of intravenous contrast. Multiplanar CT image reconstructions and MIPs were obtained to evaluate the vascular anatomy. Multidetector CT imaging of the abdomen and pelvis was performed using the standard protocol during bolus administration of intravenous contrast. CONTRAST:  132mL OMNIPAQUE IOHEXOL 350 MG/ML SOLN COMPARISON:  CT dated 04/18/2018 FINDINGS: CTA CHEST FINDINGS Cardiovascular: Contrast injection is sufficient to demonstrate satisfactory opacification of the pulmonary arteries to the segmental level. There is no pulmonary embolus. The main pulmonary artery is dilated measuring approximately 3.8 cm in diameter. There is no CT evidence of acute right heart strain. There are mild atherosclerotic changes of the visualized thoracic aorta. Heart size is enlarged. There is significant wall thickening of the left ventricle. 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 There is no significant pericardial effusion. Mediastinum/Nodes: --No mediastinal or hilar lymphadenopathy. --No axillary lymphadenopathy. --No supraclavicular lymphadenopathy. --a large left-sided thyroid nodule is again noted. --The esophagus is unremarkable Lungs/Pleura: The lung volumes are low. The trachea is unremarkable. There is a trace right-sided pleural effusion. There is no pneumothorax or focal infiltrate. There is a small amount of atelectasis in the lingula. Musculoskeletal: No chest wall abnormality. No acute or significant osseous findings. Review of the MIP images confirms the above findings. CT ABDOMEN and PELVIS FINDINGS Hepatobiliary: The liver is normal. Status post cholecystectomy.There is no biliary ductal dilation. Pancreas: Normal contours without ductal dilatation. No peripancreatic fluid collection. Spleen: No splenic laceration or hematoma. Adrenals/Urinary Tract: --Adrenal glands: No adrenal hemorrhage. --Right kidney/ureter: No hydronephrosis or perinephric hematoma. --Left kidney/ureter: No  hydronephrosis or perinephric hematoma. --Urinary bladder: Unremarkable. Stomach/Bowel: --Stomach/Duodenum: No hiatal hernia or other gastric abnormality. Normal duodenal course and caliber. --Small bowel: No dilatation or inflammation. --Colon: No focal abnormality. --Appendix: Normal. Vascular/Lymphatic: Atherosclerotic calcification is present within the non-aneurysmal abdominal aorta, without hemodynamically significant stenosis. --No retroperitoneal lymphadenopathy. --No mesenteric lymphadenopathy. --No pelvic or inguinal lymphadenopathy. Reproductive: Unremarkable Other: There is a partially visualized 1.7 x 1.2 cm subcutaneous nodule in the left gluteal fold. The abdominal wall is normal. Musculoskeletal. No acute displaced fractures. Review of the MIP images confirms the above findings. IMPRESSION: 1. No acute thoracic, abdominal or pelvic injury. 2. Cardiomegaly with significant wall thickening of the left ventricle. 3. Trace right-sided pleural effusion. 4. Dilated main pulmonary artery which can be seen in patients with elevated pulmonary artery pressures. 5. Partially visualized 1.7 x 1.2 cm subcutaneous nodule in the left gluteal fold.  Recommend correlation with physical examination. This could represent a sebaceous cyst. Aortic Atherosclerosis (ICD10-I70.0). Electronically Signed   By: Constance Holster M.D.   On: 05/19/2019 11:06   Ct Abdomen Pelvis W Contrast  Result Date: 05/19/2019 CLINICAL DATA:  Bilateral lower extremity pain and shortness of breath. EXAM: CT ANGIOGRAPHY CHEST CT ABDOMEN AND PELVIS WITH CONTRAST TECHNIQUE: Multidetector CT imaging of the chest was performed using the standard protocol during bolus administration of intravenous contrast. Multiplanar CT image reconstructions and MIPs were obtained to evaluate the vascular anatomy. Multidetector CT imaging of the abdomen and pelvis was performed using the standard protocol during bolus administration of intravenous contrast.  CONTRAST:  181mL OMNIPAQUE IOHEXOL 350 MG/ML SOLN COMPARISON:  CT dated 04/18/2018 FINDINGS: CTA CHEST FINDINGS Cardiovascular: Contrast injection is sufficient to demonstrate satisfactory opacification of the pulmonary arteries to the segmental level. There is no pulmonary embolus. The main pulmonary artery is dilated measuring approximately 3.8 cm in diameter. There is no CT evidence of acute right heart strain. There are mild atherosclerotic changes of the visualized thoracic aorta. Heart size is enlarged. There is significant wall thickening of the left ventricle. 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 There is no significant pericardial effusion. Mediastinum/Nodes: --No mediastinal or hilar lymphadenopathy. --No axillary lymphadenopathy. --No supraclavicular lymphadenopathy. --a large left-sided thyroid nodule is again noted. --The esophagus is unremarkable Lungs/Pleura: The lung volumes are low. The trachea is unremarkable. There is a trace right-sided pleural effusion. There is no pneumothorax or focal infiltrate. There is a small amount of atelectasis in the lingula. Musculoskeletal: No chest wall abnormality. No acute or significant osseous findings. Review of the MIP images confirms the above findings. CT ABDOMEN and PELVIS FINDINGS Hepatobiliary: The liver is normal. Status post cholecystectomy.There is no biliary ductal dilation. Pancreas: Normal contours without ductal dilatation. No peripancreatic fluid collection. Spleen: No splenic laceration or hematoma. Adrenals/Urinary Tract: --Adrenal glands: No adrenal hemorrhage. --Right kidney/ureter: No hydronephrosis or perinephric hematoma. --Left kidney/ureter: No hydronephrosis or perinephric hematoma. --Urinary bladder: Unremarkable. Stomach/Bowel: --Stomach/Duodenum: No hiatal hernia or other gastric abnormality. Normal duodenal course and caliber. --Small bowel: No dilatation or inflammation. --Colon: No focal abnormality. --Appendix: Normal.  Vascular/Lymphatic: Atherosclerotic calcification is present within the non-aneurysmal abdominal aorta, without hemodynamically significant stenosis. --No retroperitoneal lymphadenopathy. --No mesenteric lymphadenopathy. --No pelvic or inguinal lymphadenopathy. Reproductive: Unremarkable Other: There is a partially visualized 1.7 x 1.2 cm subcutaneous nodule in the left gluteal fold. The abdominal wall is normal. Musculoskeletal. No acute displaced fractures. Review of the MIP images confirms the above findings. IMPRESSION: 1. No acute thoracic, abdominal or pelvic injury. 2. Cardiomegaly with significant wall thickening of the left ventricle. 3. Trace right-sided pleural effusion. 4. Dilated main pulmonary artery which can be seen in patients with elevated pulmonary artery pressures. 5. Partially visualized 1.7 x 1.2 cm subcutaneous nodule in the left gluteal fold. Recommend correlation with physical examination. This could represent a sebaceous cyst. Aortic Atherosclerosis (ICD10-I70.0). Electronically Signed   By: Constance Holster M.D.   On: 05/19/2019 11:06   Dg Chest Port 1 View  Result Date: 05/25/2019 CLINICAL DATA:  Shortness of breath on exertion EXAM: PORTABLE CHEST 1 VIEW COMPARISON:  Chest radiograph dated 05/19/2019 and CT chest dated 05/19/2019. FINDINGS: A left subclavian approach cardiac device is redemonstrated. A 2.3 cm long radiopacity overlies the cardiac silhouette and may be external to the patient. The heart remains enlarged. Vascular calcifications are seen in the aortic arch. There is a mild diffuse bilateral interstitial opacities which are  not significantly changed. There is no pleural effusion or pneumothorax. IMPRESSION: 1. No significant interval change in the mild diffuse interstitial opacities. 2. A 2.3 cm long radiopacity overlies the cardiac silhouette and may be external to the patient. 3. Stable cardiomegaly. Electronically Signed   By: Zerita Boers M.D.   On: 05/25/2019  11:00           ASSESSMENT/PLAN    SOB and DOE due to worsening PAH and HOCM - patient reports recurrent admissions for same episodes over past few yrs with wheezing that responds to albuterol.  She has a personal history of asthma which seems to be playing a role with DOE/SOB concomitantly with complicated cardiac process.  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  Mild sleep apnea with an AHI of 7.1.  CPAP titration study was recommended as well as work-up for periodic limb movement disorder. **CPAP titration study 12/05/2017>> PLM index 65, PLM arousal index of 2.  Hypopnea index remained elevated above 10 and all pressures until a pressure of 10 was achieved and AHI was 0. CPAP of 10 was recommended.  -patient did not wear consistently.        Thank you for allowing me to participate in the care of this patient.    Patient/Family are satisfied with care plan and all questions have been answered.  This document was prepared using Dragon voice recognition software and may include unintentional dictation errors.     Ottie Glazier, M.D.  Division of [facility]

## 2019-05-26 NOTE — Progress Notes (Signed)
Julie Bender at [place] NAME: Julie Bender    MR#:  [id number]  DATE OF BIRTH:  September 19, 1960  SUBJECTIVE:  CHIEF COMPLAINT:   Chief Complaint  Patient presents with  . Shortness of Breath  . Chest Pain   The patient has better chest pain, still have shortness of breath.  Had very good diuresis ( 4 ltr) after receiving IV Lasix , now continued on oral torsemide. Had some nausea after eating and felt somewhat dizzy.  REVIEW OF SYSTEMS:  Review of Systems  Constitutional: Negative for chills, fever and malaise/fatigue.  HENT: Negative for sore throat.   Eyes: Negative for blurred vision and double vision.  Respiratory: Positive for shortness of breath. Negative for cough, hemoptysis, wheezing and stridor.   Cardiovascular: Positive for chest pain. Negative for palpitations, orthopnea and leg swelling.  Gastrointestinal: Negative for abdominal pain, blood in stool, diarrhea, melena, nausea and vomiting.  Genitourinary: Negative for dysuria, flank pain and hematuria.  Musculoskeletal: Negative for back pain and joint pain.  Neurological: Negative for dizziness, sensory change, focal weakness, seizures, loss of consciousness, weakness and headaches.  Endo/Heme/Allergies: Negative for polydipsia.  Psychiatric/Behavioral: Negative for depression. The patient is not nervous/anxious.     DRUG ALLERGIES:  No Known Allergies VITALS:  Blood pressure (!) 154/68, pulse 70, temperature 97.9 F (36.6 C), temperature source Oral, resp. rate 14, height 5\' 4"  (1.626 m), weight 96.9 kg, SpO2 96 %. PHYSICAL EXAMINATION:  Physical Exam Constitutional:      General: She is not in acute distress.    Appearance: Normal appearance. She is obese.  HENT:     Head: Normocephalic.  Eyes:     General: No scleral icterus.    Conjunctiva/sclera: Conjunctivae normal.     Pupils: Pupils are equal, round, and reactive to light.  Neck:     Musculoskeletal: Normal  range of motion and neck supple.     Vascular: No JVD.     Trachea: No tracheal deviation.  Cardiovascular:     Rate and Rhythm: Normal rate and regular rhythm.     Heart sounds: Normal heart sounds. No murmur. No gallop.   Pulmonary:     Effort: Pulmonary effort is normal. No respiratory distress.     Breath sounds: Normal breath sounds. No wheezing or rales.  Chest:     Chest wall: Tenderness present.  Abdominal:     General: Bowel sounds are normal. There is no distension.     Palpations: Abdomen is soft.     Tenderness: There is abdominal tenderness. There is no rebound.  Musculoskeletal: Normal range of motion.        General: No tenderness.     Right lower leg: No edema.     Left lower leg: No edema.  Skin:    Findings: No erythema or rash.  Neurological:     General: No focal deficit present.     Mental Status: She is alert and oriented to person, place, and time.     Cranial Nerves: No cranial nerve deficit.  Psychiatric:        Mood and Affect: Mood normal.    LABORATORY PANEL:  Female CBC Recent Labs  Lab 05/23/19 0707  WBC 7.2  HGB 10.5*  HCT 32.6*  PLT 304   ------------------------------------------------------------------------------------------------------------------ Chemistries  Recent Labs  Lab 05/25/19 0510 05/26/19 0515  NA 140  --   K 3.8  --   CL 107  --  CO2 24  --   GLUCOSE 181*  --   BUN 19  --   CREATININE 0.87 0.85  CALCIUM 8.8*  --    RADIOLOGY:  No results found. ASSESSMENT AND PLAN:   * Atypical chest pain and shortness of breath on exertion, acute diastolic congestive heart failure. Continue aspirin, Coreg and statin.  Nitro PRN.   Echocardiogram: Left ventricular ejection fraction, by visual estimation, is >75%. The left ventricle has hyperdynamic function. There is severely increased left ventricular hypertrophy. Per Dr. Saunders Revel, increase her torsemide to 30 mg BID and also schedule albuterol nebs Q4 hour; decreased carvedilol  and increased verapamil.  Consultation with pulmonary regarding possible obstructive lung disease and/or upper airway dysfunction (e.g. vocal cord dysfunction).  Pulmonology also suggested to continue diuresis as this mainly seems due to CHF. She had total 16 L diuresis during this hospital stay in last few days. Continue further diuresis as advised by cardiologist for now. Check for CHF vest. Counseled about fluid restriction regularly.  *  COPD.  Stable.  Nebulizer as needed.  Continue inhalers.  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  Continue patient's long-acting insulin and pre-meal insulin.  Sliding scale insulin ordered.  Diabetic diet.   * Hypertension.  Continue home medications.  Controlled.  *Chronic diastolic CHF.  No signs of fluid overload.  Continue torsemide.  *Gastritis/GERD.  Epigastric pain after eating.  on PPI.  All the records are reviewed and case discussed with Care Management/Social Worker. Management plans discussed with the patient, family and they are in agreement.  CODE STATUS: Full Code  TOTAL TIME TAKING CARE OF THIS PATIENT: 36 minutes.   More than 50% of the time was spent in counseling/coordination of care: YES  POSSIBLE D/C IN 1-2 DAYS, DEPENDING ON CLINICAL CONDITION.   Vaughan Basta M.D on 05/26/2019 at 3:28 PM  Between 7am to 6pm - Pager - [phone]  After 6pm go to [email or web address] - [facility]

## 2019-05-26 NOTE — Progress Notes (Signed)
Went over discharge instructions with the patient including medications and follow-up appointment. Discontinue PIV and telemetry monitor. NT to wheel patient for her ride.

## 2019-05-26 NOTE — Progress Notes (Signed)
Pt ambulated around nurses station x1 with stand by assist, pt did feel short of breath but o2sats remained above 90%. Per pt this is her baseline. Will continue to monitor.

## 2019-05-26 NOTE — Discharge Summary (Addendum)
[facility] [place] NAME: Julie Bender    MR#:  [id number]  DATE OF BIRTH:  09/04/1960  DATE OF ADMISSION:  05/19/2019 ADMITTING PHYSICIAN: Hillary Bow, MD  DATE OF DISCHARGE: 05/26/2019   PRIMARY CARE PHYSICIAN: [facility]    ADMISSION DIAGNOSIS:  SOB (shortness of breath) [R06.02] Chest pain, unspecified type [R07.9]  DISCHARGE DIAGNOSIS:  Active Problems:   Hypertrophic cardiomyopathy ([place])   Chest pain   Atypical chest pain   SECONDARY DIAGNOSIS:   Past Medical History:  Diagnosis Date  . CHF (congestive heart failure) ([place])   . COPD (chronic obstructive pulmonary disease) ([place])   . Diabetes mellitus without complication ([place])   . Diabetic retinopathy ([place])   . History of placement of internal cardiac defibrillator   . Hypertension   . Hypertrophic cardiomyopathy [facility])     HOSPITAL COURSE:   * Atypical chest pain and shortness of breath on exertion, acute diastolic congestive heart failure. Continue aspirin, Coreg and statin. Nitro PRN.   Echocardiogram: Left ventricular ejection fraction, by visual estimation, is >75%. The left ventricle has hyperdynamic function. There is severely increased left ventricular hypertrophy. Per Dr. Saunders Revel, increase her torsemide to 30 mg BID and also schedule albuterol nebs Q4 hour; changed to metoprolol and continue verapamil. Consultation with pulmonary regarding possible obstructive lung disease and/or upper airway dysfunction (e.g. vocal cord dysfunction).  Pulmonology also suggested to continue diuresis as this mainly seems due to CHF. She had total 18 L diuresis during this hospital stay in last few days. Continue further diuresis as advised by cardiologist for now. Check for CHF vest-she had 30% fluid which is normal. Counseled about fluid restriction regularly. She can be discharged home today with follow-up in cardiology clinic soon.  *  COPD. Stable.  Nebulizer as needed. Continue inhalers.  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 Continue patient's long-acting insulin and pre-meal insulin. Sliding scale insulin ordered. Diabetic diet.   * Hypertension. Continue home medications.  Controlled.  *Chronic diastolic CHF. No signs of fluid overload. Continue torsemide.  *Gastritis/GERD. Epigastric pain after eating. on PPI.  She was able to ambulate with nurse with minimal shortness of breath. At her baseline for last many more months she was feeling short of breath while walking from her room to the bathroom which is roughly 15-20 steps.  DISCHARGE CONDITIONS:   Stable  CONSULTS OBTAINED:  Treatment Team:  Ottie Glazier, MD  DRUG ALLERGIES:  No Known Allergies  DISCHARGE MEDICATIONS:   Allergies as of 05/26/2019   No Known Allergies     Medication List    STOP taking these medications   carvedilol 25 MG tablet Commonly known as: COREG   losartan 50 MG tablet Commonly known as: COZAAR   spironolactone 25 MG tablet Commonly known as: ALDACTONE   Victoza 18 MG/3ML Sopn Generic drug: liraglutide     TAKE these medications   albuterol 108 (90 Base) MCG/ACT inhaler Commonly known as: VENTOLIN HFA Inhale 2 puffs into the lungs every 6 (six) hours as needed for wheezing or shortness of breath.   aspirin EC 81 MG tablet Take 81 mg by mouth daily.   atorvastatin 40 MG tablet Commonly known as: LIPITOR Take 1 tablet (40 mg total) by mouth daily at 6 PM. What changed: when to take this   citalopram 20 MG tablet Commonly known as: CELEXA Take 20 mg by mouth daily.   gabapentin 300 MG capsule  Commonly known as: NEURONTIN Take 1 capsule (300 mg total) by mouth 2 (two) times daily.   insulin detemir 100 UNIT/ML injection Commonly known as: LEVEMIR Inject 42 Units into the skin at bedtime.   metFORMIN 850 MG tablet Commonly known as: GLUCOPHAGE Take 850 mg  by mouth 3 (three) times daily.   metolazone 2.5 MG tablet Commonly known as: ZAROXOLYN Take 1 tablet (2.5 mg total) by mouth daily as needed (For 3 pound weight gain).   metoprolol succinate 25 MG 24 hr tablet Commonly known as: TOPROL-XL Take 1 tablet (25 mg total) by mouth daily. Start taking on: May 27, 2019   nitroGLYCERIN 0.4 MG SL tablet Commonly known as: NITROSTAT Place 1 tablet (0.4 mg total) under the tongue every 5 (five) minutes as needed for chest pain.   NovoLOG FlexPen 100 UNIT/ML FlexPen Generic drug: insulin aspart Inject 13 Units into the skin 3 (three) times daily with meals.   pantoprazole 40 MG tablet Commonly known as: PROTONIX Take 1 tablet (40 mg total) by mouth 2 (two) times daily before a meal.   torsemide 20 MG tablet Commonly known as: DEMADEX Take 2 tablets (40 mg total) by mouth 2 (two) times daily. What changed: See the new instructions.   traZODone 150 MG tablet Commonly known as: DESYREL Take 150 mg by mouth at bedtime.   verapamil 240 MG CR tablet Commonly known as: CALAN-SR Take 1 tablet (240 mg) by mouth once daily            Durable Medical Equipment  (From admission, onward)         Start     Ordered   05/20/19 1705  For home use only DME 4 wheeled rolling walker with seat  Once    Question:  Patient needs a walker to treat with the following condition  Answer:  Weakness generalized   05/20/19 1705           DISCHARGE INSTRUCTIONS:   Follow with cardiologist in 1 to 2 weeks.  If you experience worsening of your admission symptoms, develop shortness of breath, life threatening emergency, suicidal or homicidal thoughts you must seek medical attention immediately by calling 911 or calling your MD immediately  if symptoms less severe.  You Must read complete instructions/literature along with all the possible adverse reactions/side effects for all the Medicines you take and that have been prescribed to you. Take any  new Medicines after you have completely understood and accept all the possible adverse reactions/side effects.   Please note  You were cared for by a hospitalist during your hospital stay. If you have any questions about your discharge medications or the care you received while you were in the hospital after you are discharged, you can call the unit and asked to speak with the hospitalist on call if the hospitalist that took care of you is not available. Once you are discharged, your primary care physician will handle any further medical issues. Please note that NO REFILLS for any discharge medications will be authorized once you are discharged, as it is imperative that you return to your primary care physician (or establish a relationship with a primary care physician if you do not have one) for your aftercare needs so that they can reassess your need for medications and monitor your lab values.    Today   CHIEF COMPLAINT:   Chief Complaint  Patient presents with  . Shortness of Breath  . Chest Pain    HISTORY  OF PRESENT ILLNESS:  Julie Bender  is a 58 y.o. female with a known history COPD, hypertension, diabetes mellitus, hypertrophic cardiomyopathy, diastolic CHF presents to the emergency room complaining of 3 days of intermittent shortness of breath and chest pain.  Chest pain is diffuse all over the chest lasts a few minutes.  No aggravating relieving factors.  She does have baseline shortness of breath and has been feeling more short of breath with exertion.  Troponin found to be 90.  In the past troponin was significantly elevated with COPD exacerbations.  She also complains of bilateral lower extremity pain and tingling and numbness in hands and feet which is chronic.  Pain in epigastric area after eating. Here EKG showed nothing acute.  Patient is being admitted for chest pain rule out. She had a catheterization 5 years back and she does not remember the results.  No recent stress  test. Follows with [facility] cardiology and [facility] pulmonary. COVID-19 test is negativeof    VITAL SIGNS:  Blood pressure 140/60, pulse (!) 55, temperature 97.9 F (36.6 C), temperature source Oral, resp. rate 18, height 5\' 4"  (1.626 m), weight 96.9 kg, SpO2 98 %.  I/O:    Intake/Output Summary (Last 24 hours) at 05/26/2019 1614 Last data filed at 05/26/2019 1537 Gross per 24 hour  Intake 480 ml  Output 3550 ml  Net -3070 ml    PHYSICAL EXAMINATION:  GENERAL:  58 y.o.-year-old patient lying in the bed with no acute distress.  EYES: Pupils equal, round, reactive to light and accommodation. No scleral icterus. Extraocular muscles intact.  HEENT: Head atraumatic, normocephalic. Oropharynx and nasopharynx clear.  NECK:  Supple, no jugular venous distention. No thyroid enlargement, no tenderness.  LUNGS: Normal breath sounds bilaterally, no wheezing, rales,rhonchi or crepitation. No use of accessory muscles of respiration.  CARDIOVASCULAR: S1, S2 normal. No murmurs, rubs, or gallops.  ABDOMEN: Soft, non-tender, non-distended. Bowel sounds present. No organomegaly or mass.  EXTREMITIES: No pedal edema, cyanosis, or clubbing.  NEUROLOGIC: Cranial nerves II through XII are intact. Muscle strength 4/5 in all extremities. Sensation intact. Gait not checked.  PSYCHIATRIC: The patient is alert and oriented x 3.  SKIN: No obvious rash, lesion, or ulcer.   DATA REVIEW:   CBC Recent Labs  Lab 05/23/19 0707  WBC 7.2  HGB 10.5*  HCT 32.6*  PLT 304    Chemistries  Recent Labs  Lab 05/25/19 0510 05/26/19 0515  NA 140  --   K 3.8  --   CL 107  --   CO2 24  --   GLUCOSE 181*  --   BUN 19  --   CREATININE 0.87 0.85  CALCIUM 8.8*  --     Cardiac Enzymes No results for input(s): TROPONINI in the last 168 hours.  Microbiology Results  Results for orders placed or performed during the hospital encounter of 05/19/19  SARS Coronavirus 2 [facility] order,  Performed in [facility] hospital lab) Nasopharyngeal Nasopharyngeal Swab     Status: None   Collection Time: 05/19/19 10:03 AM   Specimen: Nasopharyngeal Swab  Result Value Ref Range Status   SARS Coronavirus 2 NEGATIVE NEGATIVE Final    Comment: (NOTE) If result is NEGATIVE SARS-CoV-2 target nucleic acids are NOT DETECTED. The SARS-CoV-2 RNA is generally detectable in upper and lower  respiratory specimens during the acute phase of infection. The lowest  concentration of SARS-CoV-2 viral copies this assay can detect is 250  copies / mL. A  negative result does not preclude SARS-CoV-2 infection  and should not be used as the sole basis for treatment or other  patient management decisions.  A negative result may occur with  improper specimen collection / handling, submission of specimen other  than nasopharyngeal swab, presence of viral mutation(s) within the  areas targeted by this assay, and inadequate number of viral copies  (<250 copies / mL). A negative result must be combined with clinical  observations, patient history, and epidemiological information. If result is POSITIVE SARS-CoV-2 target nucleic acids are DETECTED. The SARS-CoV-2 RNA is generally detectable in upper and lower  respiratory specimens dur ing the acute phase of infection.  Positive  results are indicative of active infection with SARS-CoV-2.  Clinical  correlation with patient history and other diagnostic information is  necessary to determine patient infection status.  Positive results do  not rule out bacterial infection or co-infection with other viruses. If result is PRESUMPTIVE POSTIVE SARS-CoV-2 nucleic acids MAY BE PRESENT.   A presumptive positive result was obtained on the submitted specimen  and confirmed on repeat testing.  While 2019 novel coronavirus  (SARS-CoV-2) nucleic acids may be present in the submitted sample  additional confirmatory testing may be necessary for epidemiological  and / or  clinical management purposes  to differentiate between  SARS-CoV-2 and other Sarbecovirus currently known to infect humans.  If clinically indicated additional testing with an alternate test  methodology [phone]) is advised. The SARS-CoV-2 RNA is generally  detectable in upper and lower respiratory sp ecimens during the acute  phase of infection. The expected result is Negative. Fact Sheet for Patients:  StrictlyIdeas.no Fact Sheet for Healthcare Providers: BankingDealers.co.za This test is not yet approved or cleared by the [place] FDA and has been authorized for detection and/or diagnosis of SARS-CoV-2 by FDA under an Emergency Use Authorization (EUA).  This EUA will remain in effect (meaning this test can be used) for the duration of the COVID-19 declaration under Section 564(b)(1) of the Act, 21 U.S.C. section 360bbb-3(b)(1), unless the authorization is terminated or revoked sooner. Performed at [facility], [address] [place], [place] [address]   Respiratory Panel by PCR     Status: None   Collection Time: 05/22/19  6:14 PM   Specimen: Nasopharyngeal Swab; Respiratory  Result Value Ref Range Status   Adenovirus NOT DETECTED NOT DETECTED Final   Coronavirus 229E NOT DETECTED NOT DETECTED Final    Comment: (NOTE) The Coronavirus on the Respiratory Panel, DOES NOT test for the novel  Coronavirus (2019 nCoV)    Coronavirus HKU1 NOT DETECTED NOT DETECTED Final   Coronavirus NL63 NOT DETECTED NOT DETECTED Final   Coronavirus OC43 NOT DETECTED NOT DETECTED Final   Metapneumovirus NOT DETECTED NOT DETECTED Final   Rhinovirus / Enterovirus NOT DETECTED NOT DETECTED Final   Influenza A NOT DETECTED NOT DETECTED Final   Influenza B NOT DETECTED NOT DETECTED Final   Parainfluenza Virus 1 NOT DETECTED NOT DETECTED Final   Parainfluenza Virus 2 NOT DETECTED NOT DETECTED Final   Parainfluenza Virus 3 NOT DETECTED NOT  DETECTED Final   Parainfluenza Virus 4 NOT DETECTED NOT DETECTED Final   Respiratory Syncytial Virus NOT DETECTED NOT DETECTED Final   Bordetella pertussis NOT DETECTED NOT DETECTED Final   Chlamydophila pneumoniae NOT DETECTED NOT DETECTED Final   Mycoplasma pneumoniae NOT DETECTED NOT DETECTED Final    Comment: Performed at [facility], [place]. [address] [place], [place] [address]  RADIOLOGY:  Dg Chest Port 1 View  Result Date: 05/25/2019 CLINICAL DATA:  Shortness of breath on exertion EXAM: PORTABLE CHEST 1 VIEW COMPARISON:  Chest radiograph dated 05/19/2019 and CT chest dated 05/19/2019. FINDINGS: A left subclavian approach cardiac device is redemonstrated. A 2.3 cm long radiopacity overlies the cardiac silhouette and may be external to the patient. The heart remains enlarged. Vascular calcifications are seen in the aortic arch. There is a mild diffuse bilateral interstitial opacities which are not significantly changed. There is no pleural effusion or pneumothorax. IMPRESSION: 1. No significant interval change in the mild diffuse interstitial opacities. 2. A 2.3 cm long radiopacity overlies the cardiac silhouette and may be external to the patient. 3. Stable cardiomegaly. Electronically Signed   By: Zerita Boers M.D.   On: 05/25/2019 11:00    EKG:   Orders placed or performed during the hospital encounter of 05/19/19  . ED EKG  . ED EKG      Management plans discussed with the patient, family and they are in agreement.  CODE STATUS: Full.    Code Status Orders  (From admission, onward)         Start     Ordered   05/19/19 1158  Full code  Continuous     05/19/19 1159        Code Status History    Date Active Date Inactive Code Status Order ID Comments User Context   02/23/2019 1918 02/25/2019 1621 Full Code [id number]  Lang Snow, NP ED   04/18/2018 0209 04/20/2018 1930 Full Code [id number]  Arta Silence, MD Inpatient   09/05/2016 0305 09/11/2016  1601 Full Code [id number]  Hugelmeyer, Ubaldo Glassing, DO Inpatient   Advance Care Planning Activity      TOTAL TIME TAKING CARE OF THIS PATIENT: 35 minutes.    Vaughan Basta M.D on 05/26/2019 at 4:14 PM  Between 7am to 6pm - Pager - [phone]  After 6pm go to [email or web address] - password [email or web address] [place] [facility]  Office  [phone]  CC: Primary care physician; [facility]   Note: This dictation was prepared with Dragon dictation along with smaller phrase technology. Any transcriptional errors that result from this process are unintentional.

## 2019-06-01 ENCOUNTER — Other Ambulatory Visit: Payer: Self-pay

## 2019-06-01 ENCOUNTER — Inpatient Hospital Stay
Admission: EM | Admit: 2019-06-01 | Discharge: 2019-06-09 | DRG: 292 | Disposition: A | Discharge status: Home / Self Care | Payer: Medicare Other | Attending: Internal Medicine | Admitting: Internal Medicine

## 2019-06-01 ENCOUNTER — Emergency Department: Payer: Medicare Other

## 2019-06-01 DIAGNOSIS — E11319 Type 2 diabetes mellitus with unspecified diabetic retinopathy without macular edema: Secondary | ICD-10-CM | POA: Diagnosis present

## 2019-06-01 DIAGNOSIS — Z95810 Presence of automatic (implantable) cardiac defibrillator: Secondary | ICD-10-CM

## 2019-06-01 DIAGNOSIS — I110 Hypertensive heart disease with heart failure: Secondary | ICD-10-CM | POA: Diagnosis present

## 2019-06-01 DIAGNOSIS — R0603 Acute respiratory distress: Secondary | ICD-10-CM | POA: Diagnosis present

## 2019-06-01 DIAGNOSIS — I422 Other hypertrophic cardiomyopathy: Secondary | ICD-10-CM | POA: Diagnosis present

## 2019-06-01 DIAGNOSIS — I472 Ventricular tachycardia: Secondary | ICD-10-CM | POA: Diagnosis present

## 2019-06-01 DIAGNOSIS — G4700 Insomnia, unspecified: Secondary | ICD-10-CM | POA: Diagnosis present

## 2019-06-01 DIAGNOSIS — J069 Acute upper respiratory infection, unspecified: Secondary | ICD-10-CM | POA: Diagnosis present

## 2019-06-01 DIAGNOSIS — I5033 Acute on chronic diastolic (congestive) heart failure: Secondary | ICD-10-CM | POA: Diagnosis present

## 2019-06-01 DIAGNOSIS — E119 Type 2 diabetes mellitus without complications: Secondary | ICD-10-CM

## 2019-06-01 DIAGNOSIS — Z7722 Contact with and (suspected) exposure to environmental tobacco smoke (acute) (chronic): Secondary | ICD-10-CM

## 2019-06-01 DIAGNOSIS — E669 Obesity, unspecified: Secondary | ICD-10-CM | POA: Diagnosis present

## 2019-06-01 DIAGNOSIS — I517 Cardiomegaly: Secondary | ICD-10-CM | POA: Diagnosis not present

## 2019-06-01 DIAGNOSIS — F411 Generalized anxiety disorder: Secondary | ICD-10-CM | POA: Diagnosis present

## 2019-06-01 DIAGNOSIS — E1165 Type 2 diabetes mellitus with hyperglycemia: Secondary | ICD-10-CM | POA: Diagnosis present

## 2019-06-01 DIAGNOSIS — Z7982 Long term (current) use of aspirin: Secondary | ICD-10-CM

## 2019-06-01 DIAGNOSIS — J449 Chronic obstructive pulmonary disease, unspecified: Secondary | ICD-10-CM | POA: Diagnosis present

## 2019-06-01 DIAGNOSIS — Z20828 Contact with and (suspected) exposure to other viral communicable diseases: Secondary | ICD-10-CM | POA: Diagnosis present

## 2019-06-01 DIAGNOSIS — Z794 Long term (current) use of insulin: Secondary | ICD-10-CM

## 2019-06-01 DIAGNOSIS — I2510 Atherosclerotic heart disease of native coronary artery without angina pectoris: Secondary | ICD-10-CM | POA: Diagnosis present

## 2019-06-01 DIAGNOSIS — I313 Pericardial effusion (noninflammatory): Secondary | ICD-10-CM | POA: Diagnosis present

## 2019-06-01 DIAGNOSIS — J4541 Moderate persistent asthma with (acute) exacerbation: Secondary | ICD-10-CM | POA: Diagnosis not present

## 2019-06-01 DIAGNOSIS — R0602 Shortness of breath: Secondary | ICD-10-CM

## 2019-06-01 DIAGNOSIS — G4733 Obstructive sleep apnea (adult) (pediatric): Secondary | ICD-10-CM | POA: Diagnosis present

## 2019-06-01 DIAGNOSIS — K5900 Constipation, unspecified: Secondary | ICD-10-CM | POA: Diagnosis present

## 2019-06-01 DIAGNOSIS — J45901 Unspecified asthma with (acute) exacerbation: Secondary | ICD-10-CM | POA: Diagnosis present

## 2019-06-01 DIAGNOSIS — R531 Weakness: Secondary | ICD-10-CM | POA: Diagnosis not present

## 2019-06-01 DIAGNOSIS — R0601 Orthopnea: Secondary | ICD-10-CM | POA: Diagnosis not present

## 2019-06-01 DIAGNOSIS — D649 Anemia, unspecified: Secondary | ICD-10-CM | POA: Diagnosis present

## 2019-06-01 DIAGNOSIS — I950 Idiopathic hypotension: Secondary | ICD-10-CM | POA: Diagnosis present

## 2019-06-01 DIAGNOSIS — B9789 Other viral agents as the cause of diseases classified elsewhere: Secondary | ICD-10-CM | POA: Diagnosis present

## 2019-06-01 DIAGNOSIS — I421 Obstructive hypertrophic cardiomyopathy: Secondary | ICD-10-CM | POA: Diagnosis present

## 2019-06-01 DIAGNOSIS — Z79899 Other long term (current) drug therapy: Secondary | ICD-10-CM

## 2019-06-01 DIAGNOSIS — I10 Essential (primary) hypertension: Secondary | ICD-10-CM | POA: Diagnosis present

## 2019-06-01 LAB — CBC WITH DIFFERENTIAL/PLATELET
Abs Immature Granulocytes: 0.04 10*3/uL (ref 0.00–0.07)
Basophils Absolute: 0 10*3/uL (ref 0.0–0.1)
Basophils Relative: 0 %
Eosinophils Absolute: 0.4 10*3/uL (ref 0.0–0.5)
Eosinophils Relative: 5 %
HCT: 32.2 % — ABNORMAL LOW (ref 36.0–46.0)
Hemoglobin: 10.1 g/dL — ABNORMAL LOW (ref 12.0–15.0)
Immature Granulocytes: 1 %
Lymphocytes Relative: 19 %
Lymphs Abs: 1.6 10*3/uL (ref 0.7–4.0)
MCH: 26.8 pg (ref 26.0–34.0)
MCHC: 31.4 g/dL (ref 30.0–36.0)
MCV: 85.4 fL (ref 80.0–100.0)
Monocytes Absolute: 1.1 10*3/uL — ABNORMAL HIGH (ref 0.1–1.0)
Monocytes Relative: 13 %
Neutro Abs: 5.3 10*3/uL (ref 1.7–7.7)
Neutrophils Relative %: 62 %
Platelets: 320 10*3/uL (ref 150–400)
RBC: 3.77 MIL/uL — ABNORMAL LOW (ref 3.87–5.11)
RDW: 15.3 % (ref 11.5–15.5)
WBC: 8.4 10*3/uL (ref 4.0–10.5)
nRBC: 0 % (ref 0.0–0.2)

## 2019-06-01 LAB — LACTIC ACID, PLASMA
Lactic Acid, Venous: 1.9 mmol/L (ref 0.5–1.9)
Lactic Acid, Venous: 1.6 mmol/L (ref 0.5–1.9)

## 2019-06-01 LAB — COMPREHENSIVE METABOLIC PANEL
ALT: 11 U/L (ref 0–44)
AST: 15 U/L (ref 15–41)
Albumin: 3.5 g/dL (ref 3.5–5.0)
Alkaline Phosphatase: 66 U/L (ref 38–126)
Anion gap: 6 (ref 5–15)
BUN: 21 mg/dL — ABNORMAL HIGH (ref 6–20)
CO2: 27 mmol/L (ref 22–32)
Calcium: 8.6 mg/dL — ABNORMAL LOW (ref 8.9–10.3)
Chloride: 106 mmol/L (ref 98–111)
Creatinine, Ser: 1.16 mg/dL — ABNORMAL HIGH (ref 0.44–1.00)
GFR calc Af Amer: 59 mL/min — ABNORMAL LOW (ref >60)
GFR calc non Af Amer: 51 mL/min — ABNORMAL LOW (ref >60)
Glucose, Bld: 109 mg/dL — ABNORMAL HIGH (ref 70–99)
Potassium: 4.3 mmol/L (ref 3.5–5.1)
Sodium: 139 mmol/L (ref 135–145)
Total Bilirubin: 0.5 mg/dL (ref 0.3–1.2)
Total Protein: 6.6 g/dL (ref 6.5–8.1)

## 2019-06-01 LAB — BLOOD GAS, VENOUS
Acid-Base Excess: 1 mmol/L (ref 0.0–2.0)
Bicarbonate: 27.1 mmol/L (ref 20.0–28.0)
O2 Saturation: 44.7 %
Patient temperature: 37
pCO2, Ven: 48 mmHg (ref 44.0–60.0)
pH, Ven: 7.36 (ref 7.250–7.430)
pO2, Ven: <31 mmHg — CL (ref 32.0–45.0)

## 2019-06-01 LAB — INFLUENZA PANEL BY PCR (TYPE A & B)
Influenza A By PCR: NEGATIVE
Influenza B By PCR: NEGATIVE

## 2019-06-01 LAB — TROPONIN I (HIGH SENSITIVITY)
Troponin I (High Sensitivity): 56 ng/L — ABNORMAL HIGH (ref <18)
Troponin I (High Sensitivity): 56 ng/L — ABNORMAL HIGH (ref <18)

## 2019-06-01 LAB — SARS CORONAVIRUS 2 BY RT PCR (HOSPITAL ORDER, PERFORMED IN ~~LOC~~ HOSPITAL LAB): SARS Coronavirus 2: NEGATIVE

## 2019-06-01 LAB — BRAIN NATRIURETIC PEPTIDE: B Natriuretic Peptide: 358 pg/mL — ABNORMAL HIGH (ref 0.0–100.0)

## 2019-06-01 MED ORDER — HYDROCOD POLST-CPM POLST ER 10-8 MG/5ML PO SUER
5.0000 mL | Freq: Once | ORAL | Status: AC
  Administered 2019-06-01: 5 mL via ORAL
  Filled 2019-06-01: qty 5

## 2019-06-01 MED ORDER — IPRATROPIUM-ALBUTEROL 0.5-2.5 (3) MG/3ML IN SOLN: 3.0000 mL | Freq: Once | RESPIRATORY_TRACT | Status: DC

## 2019-06-01 MED ORDER — IPRATROPIUM-ALBUTEROL 0.5-2.5 (3) MG/3ML IN SOLN
3.0000 mL | Freq: Once | RESPIRATORY_TRACT | Status: AC
  Administered 2019-06-01: 3 mL via RESPIRATORY_TRACT
  Filled 2019-06-01: qty 3

## 2019-06-01 MED ORDER — METHYLPREDNISOLONE SODIUM SUCC 125 MG IJ SOLR: 125.0000 mg | Freq: Once | INTRAMUSCULAR | Status: DC

## 2019-06-01 NOTE — ED Provider Notes (Signed)
[facility] Emergency Department Provider Note       Time seen: ----------------------------------------- 5:52 PM on 06/01/2019 -----------------------------------------   I have reviewed the triage vital signs and the nursing notes.  HISTORY   Chief Complaint Cough and Weakness    HPI Julie Bender is a 58 y.o. female with a history of CHF, COPD, diabetes, hypertension, cardiomyopathy who presents to the ED for cough, body aches and fatigue since Monday.  She reports wheezing with a history of asthma.  Patient states she was recently hospitalized where she had numerous liters of fluid diuresed from her.  She has felt weak since going home.  Pain is 7 out of 10.  Past Medical History:  Diagnosis Date  . CHF (congestive heart failure) ([place])   . COPD (chronic obstructive pulmonary disease) ([place])   . Diabetes mellitus without complication ([place])   . Diabetic retinopathy ([place])   . History of placement of internal cardiac defibrillator   . Hypertension   . Hypertrophic cardiomyopathy [facility])     Patient Active Problem List   Diagnosis Date Noted  . Atypical chest pain 05/21/2019  . Chest pain 05/19/2019  . Acute respiratory failure with hypoxia ([place]) 02/23/2019  . Obesity 05/10/2018  . Dyspnea 05/05/2018  . Thyroid nodule 05/05/2018  . OSA (obstructive sleep apnea) 04/30/2018  . Hypertensive heart disease with heart failure ([place]) 04/30/2018  . Acute respiratory distress 04/18/2018  . Chronic right shoulder pain 02/27/2018  . Elevated troponin 05/10/2017  . Bronchitis 09/06/2016  . Demand ischemia ([place]) 09/06/2016  . Hypertrophic cardiomyopathy ([place]) 09/06/2016  . Routine history and physical examination of adult 08/24/2016  . Volume overload 04/20/2016  . Cardiac defibrillator in place 01/30/2014  . Depression 01/30/2014  . Insomnia 11/29/2012  . Essential hypertension 01/20/2011  . Diabetes mellitus ([place]) 10/31/2008    Past Surgical History:   Procedure Laterality Date  . CARDIAC DEFIBRILLATOR PLACEMENT    . CHOLECYSTECTOMY      Allergies Patient has no known allergies.  Social History Social History   Tobacco Use  . Smoking status: Passive Smoke Exposure - Never Smoker  . Smokeless tobacco: Never Used  Substance Use Topics  . Alcohol use: No  . Drug use: Never   Review of Systems Constitutional: Negative for fever. Cardiovascular: Negative for chest pain. Respiratory: Positive shortness of breath and cough Gastrointestinal: Negative for abdominal pain, vomiting and diarrhea. Musculoskeletal: Negative for back pain. Skin: Negative for rash. Neurological: Positive for weakness  All systems negative/normal/unremarkable except as stated in the HPI  ____________________________________________   PHYSICAL EXAM:  VITAL SIGNS: ED Triage Vitals  Enc Vitals Group     BP 06/01/19 1615 (!) 100/49     Pulse Rate 06/01/19 1615 62     Resp 06/01/19 1615 16     Temp 06/01/19 1615 98.7 F (37.1 C)     Temp Source 06/01/19 1615 Oral     SpO2 06/01/19 1614 94 %     Weight 06/01/19 1616 212 lb (96.2 kg)     Height 06/01/19 1616 5\' 4"  (1.626 m)     Head Circumference --      Peak Flow --      Pain Score 06/01/19 1615 7     Pain Loc --      Pain Edu? --      Excl. in [place]? --    Constitutional: Alert and oriented.  Lethargic, no distress Eyes: Conjunctivae are normal. Normal extraocular movements. ENT  Head: Normocephalic and atraumatic.      Nose: No congestion/rhinnorhea.      Mouth/Throat: Mucous membranes are moist.      Neck: No stridor. Cardiovascular: Normal rate, regular rhythm. No murmurs, rubs, or gallops. Respiratory: Normal respiratory effort without tachypnea nor retractions. Breath sounds are clear and equal bilaterally. No wheezes/rales/rhonchi. Gastrointestinal: Soft and nontender. Normal bowel sounds Musculoskeletal: Nontender with normal range of motion in extremities. No lower extremity  tenderness nor edema. Neurologic:  Normal speech and language. No gross focal neurologic deficits are appreciated.  Generalized weakness, nothing focal Skin:  Skin is warm, dry and intact.  Pallor is noted Psychiatric: Mood and affect are normal. Speech and behavior are normal.  ____________________________________________  EKG: Interpreted by me.  Atrial paced rhythm with a rate of 55 bpm, normal pacemaker function  ____________________________________________  ED COURSE:  As part of my medical decision making, I reviewed the following data within the [place] History obtained from family if available, nursing notes, old chart and ekg, as well as notes from prior ED visits. Patient presented for cough, body aches and fatigue, we will assess with labs and imaging as indicated at this time.   Procedures  Julie Bender was evaluated in Emergency Department on 06/01/2019 for the symptoms described in the history of present illness. She was evaluated in the context of the global COVID-19 pandemic, which necessitated consideration that the patient might be at risk for infection with the SARS-CoV-2 virus that causes COVID-19. Institutional protocols and algorithms that pertain to the evaluation of patients at risk for COVID-19 are in a state of rapid change based on information released by regulatory bodies including the CDC and federal and state organizations. These policies and algorithms were followed during the patient's care in the ED.  ____________________________________________   LABS (pertinent positives/negatives)  Labs Reviewed  CBC WITH DIFFERENTIAL/PLATELET - Abnormal; Notable for the following components:      Result Value   RBC 3.77 (*)    Hemoglobin 10.1 (*)    HCT 32.2 (*)    Monocytes Absolute 1.1 (*)    All other components within normal limits  COMPREHENSIVE METABOLIC PANEL - Abnormal; Notable for the following components:   Glucose, Bld 109 (*)     BUN 21 (*)    Creatinine, Ser 1.16 (*)    Calcium 8.6 (*)    GFR calc non Af Amer 51 (*)    GFR calc Af Amer 59 (*)    All other components within normal limits  BRAIN NATRIURETIC PEPTIDE - Abnormal; Notable for the following components:   B Natriuretic Peptide 358.0 (*)    All other components within normal limits  BLOOD GAS, VENOUS - Abnormal; Notable for the following components:   pO2, Ven <31.0 (*)    All other components within normal limits  TROPONIN I (HIGH SENSITIVITY) - Abnormal; Notable for the following components:   Troponin I (High Sensitivity) 56 (*)    All other components within normal limits  SARS CORONAVIRUS 2 BY RT PCR (HOSPITAL ORDER, [facility])  LACTIC ACID, PLASMA  LACTIC ACID, PLASMA  TROPONIN I (HIGH SENSITIVITY)    RADIOLOGY Images were viewed by me  Chest x-ray IMPRESSION: 1. Unchanged bilateral diffuse interstitial opacities and central venous congestion likely reflecting some interstitial edema. 2. Stable cardiomegaly.  ____________________________________________   DIFFERENTIAL DIAGNOSIS   CHF, COPD, pneumonia, anasarca, renal failure, dehydration  FINAL ASSESSMENT AND PLAN  Weakness, hypotension, dyspnea  Plan: The patient had presented for weakness with persistent cough. Patient's labs overall appeared stable. Patient's imaging did not reveal any acute process.  She did have central venous congestion and edema but not significantly changed.  She had previously been aggressively diuresed and has had some medication changes.  Patient states she does not feel well enough to go home, we did give her breathing treatment as well as some IV fluids due to hypotension.  I will discuss with hospitalist for observation.   Laurence Aly, MD    Note: This note was generated in part or whole with voice recognition software. Voice recognition is usually quite accurate but there are transcription errors that can and  very often do occur. I apologize for any typographical errors that were not detected and corrected.     Earleen Newport, MD 06/01/19 2002

## 2019-06-01 NOTE — ED Notes (Addendum)
Went into pt's room to check vital signs and found BP as 93/51 and O2- 92% on room air. Placed pt on 2 L oxygen and O2 sat went to 94%. Checked BP again and it was 67/43. Charged nurse notified, IV obtained and fluids started. Transferred pt to main side. Dr. Juliet Rude notified.

## 2019-06-01 NOTE — H&P (Signed)
[facility] NAME: Julie Bender    MR#:  [id number]  DATE OF BIRTH:  Sep 16, 1960  DATE OF ADMISSION:  06/01/2019  PRIMARY CARE PHYSICIAN: [facility]   REQUESTING/REFERRING PHYSICIAN: Jimmye Norman, MD  CHIEF COMPLAINT:   Chief Complaint  Patient presents with  . Cough  . Weakness    HISTORY OF PRESENT ILLNESS:  Julie Bender  is a 58 y.o. female who presents with chief complaint as above.  Patient presents to the ED with a complaint of cough and increased weakness.  She was recently admitted here for heart failure and excessive fluid.  She states that in the week and a half or so that she was home, she initially felt well, but began to feel progressively worse.  Today she was very short of breath.  She does feel like she has significant edema in her abdominal space.  On evaluation, she does have mild edema on her x-ray, though this is consistent with her discharge status.  Hospitalist were called for admission and further evaluation  PAST MEDICAL HISTORY:   Past Medical History:  Diagnosis Date  . CHF (congestive heart failure) ([place])   . COPD (chronic obstructive pulmonary disease) ([place])   . Diabetes mellitus without complication ([place])   . Diabetic retinopathy ([place])   . History of placement of internal cardiac defibrillator   . Hypertension   . Hypertrophic cardiomyopathy ([place])      PAST SURGICAL HISTORY:   Past Surgical History:  Procedure Laterality Date  . CARDIAC DEFIBRILLATOR PLACEMENT    . CHOLECYSTECTOMY       SOCIAL HISTORY:   Social History   Tobacco Use  . Smoking status: Passive Smoke Exposure - Never Smoker  . Smokeless tobacco: Never Used  Substance Use Topics  . Alcohol use: No     FAMILY HISTORY:   Family History  Problem Relation Age of Onset  . Hypertrophic cardiomyopathy Sister      DRUG ALLERGIES:  No Known Allergies  MEDICATIONS AT HOME:   Prior to Admission  medications   Medication Sig Start Date End Date Taking? Authorizing Provider  aspirin EC 81 MG tablet Take 81 mg by mouth daily.   Yes [provider]  atorvastatin (LIPITOR) 40 MG tablet Take 1 tablet (40 mg total) by mouth daily at 6 PM. Patient taking differently: Take 40 mg by mouth daily.  04/20/18  Yes Salary, Avel Peace, MD  gabapentin (NEURONTIN) 300 MG capsule Take 1 capsule (300 mg total) by mouth 2 (two) times daily. 05/26/19  Yes Vaughan Basta, MD  insulin aspart (NOVOLOG FLEXPEN) 100 UNIT/ML FlexPen Inject 13 Units into the skin 3 (three) times daily with meals.    Yes [provider]  insulin detemir (LEVEMIR) 100 UNIT/ML injection Inject 42 Units into the skin at bedtime.   Yes [provider]  metoprolol succinate (TOPROL-XL) 25 MG 24 hr tablet Take 1 tablet (25 mg total) by mouth daily. 05/27/19  Yes Vaughan Basta, MD  pantoprazole (PROTONIX) 40 MG tablet Take 1 tablet (40 mg total) by mouth 2 (two) times daily before a meal. 05/26/19  Yes Vaughan Basta, MD  torsemide (DEMADEX) 20 MG tablet Take 2 tablets (40 mg total) by mouth 2 (two) times daily. 05/26/19  Yes Vaughan Basta, MD  traZODone (DESYREL) 150 MG tablet Take 150 mg by mouth at bedtime. 04/02/16  Yes [provider]  verapamil (CALAN-SR) 240 MG CR tablet Take 1 tablet (  240 mg) by mouth once daily 03/05/19  Yes Gollan, Kathlene November, MD  albuterol (PROVENTIL HFA;VENTOLIN HFA) 108 (90 Base) MCG/ACT inhaler Inhale 2 puffs into the lungs every 6 (six) hours as needed for wheezing or shortness of breath. 04/20/18   Salary, Avel Peace, MD  citalopram (CELEXA) 20 MG tablet Take 20 mg by mouth daily. 05/03/16 02/23/19  [provider]  metFORMIN (GLUCOPHAGE) 850 MG tablet Take 850 mg by mouth 3 (three) times daily. 05/03/16 05/19/19  [provider]  metolazone (ZAROXOLYN) 2.5 MG tablet Take 1 tablet (2.5 mg total) by mouth daily as needed (For 3 pound  weight gain). 05/26/19   Vaughan Basta, MD  nitroGLYCERIN (NITROSTAT) 0.4 MG SL tablet Place 1 tablet (0.4 mg total) under the tongue every 5 (five) minutes as needed for chest pain. 05/20/19   Demetrios Loll, MD    REVIEW OF SYSTEMS:  Review of Systems  Constitutional: Positive for malaise/fatigue. Negative for chills, fever and weight loss.  HENT: Negative for ear pain, hearing loss and tinnitus.   Eyes: Negative for blurred vision, double vision, pain and redness.  Respiratory: Positive for cough and shortness of breath. Negative for hemoptysis.   Cardiovascular: Negative for chest pain, palpitations, orthopnea and leg swelling.  Gastrointestinal: Negative for abdominal pain, constipation, diarrhea, nausea and vomiting.  Genitourinary: Negative for dysuria, frequency and hematuria.  Musculoskeletal: Negative for back pain, joint pain and neck pain.  Skin:       No acne, rash, or lesions  Neurological: Negative for dizziness, tremors, focal weakness and weakness.  Endo/Heme/Allergies: Negative for polydipsia. Does not bruise/bleed easily.  Psychiatric/Behavioral: Negative for depression. The patient is not nervous/anxious and does not have insomnia.      VITAL SIGNS:   Vitals:   06/01/19 1615 06/01/19 1616 06/01/19 1725 06/01/19 1738  BP: (!) 100/49  (!) 93/51 (!) 67/43  Pulse: 62  (!) 55 (!) 55  Resp: 16  (!) 22 (!) 22  Temp: 98.7 F (37.1 C)     TempSrc: Oral     SpO2: 94%  92% 98%  Weight:  96.2 kg    Height:  5\' 4"  (1.626 m)     Wt Readings from Last 3 Encounters:  06/01/19 96.2 kg  05/26/19 96.9 kg  03/05/19 97.8 kg    PHYSICAL EXAMINATION:  Physical Exam  Vitals reviewed. Constitutional: She is oriented to person, place, and time. She appears well-developed and well-nourished. No distress.  HENT:  Head: Normocephalic and atraumatic.  Mouth/Throat: Oropharynx is clear and moist.  Eyes: Pupils are equal, round, and reactive to light. Conjunctivae and EOM are  normal. No scleral icterus.  Neck: Normal range of motion. Neck supple. No JVD present. No thyromegaly present.  Cardiovascular: Normal rate, regular rhythm and intact distal pulses. Exam reveals no gallop and no friction rub.  No murmur heard. Respiratory: Effort normal. No respiratory distress. She has no wheezes. She has rales.  GI: Soft. Bowel sounds are normal. She exhibits no distension. There is no abdominal tenderness.  Musculoskeletal: Normal range of motion.        General: No edema.     Comments: No arthritis, no gout  Lymphadenopathy:    She has no cervical adenopathy.  Neurological: She is alert and oriented to person, place, and time. No cranial nerve deficit.  No dysarthria, no aphasia  Skin: Skin is warm and dry. No rash noted. No erythema.  Psychiatric: She has a normal mood and affect. Her behavior is normal.  Judgment and thought content normal.    LABORATORY PANEL:   CBC Recent Labs  Lab 06/01/19 1814  WBC 8.4  HGB 10.1*  HCT 32.2*  PLT 320   ------------------------------------------------------------------------------------------------------------------  Chemistries  Recent Labs  Lab 06/01/19 1814  NA 139  K 4.3  CL 106  CO2 27  GLUCOSE 109*  BUN 21*  CREATININE 1.16*  CALCIUM 8.6*  AST 15  ALT 11  ALKPHOS 66  BILITOT 0.5   ------------------------------------------------------------------------------------------------------------------  Cardiac Enzymes No results for input(s): TROPONINI in the last 168 hours. ------------------------------------------------------------------------------------------------------------------  RADIOLOGY:  Dg Chest Portable 1 View  Result Date: 06/01/2019 CLINICAL DATA:  Cough, body aches and fatigue for 5 days EXAM: PORTABLE CHEST 1 VIEW COMPARISON:  Radiograph 05/25/2019, CT angio 05/19/2019 FINDINGS: Hazy interstitial opacities throughout the lungs with a basilar predominance are mild central venous  congestion with cardiomegaly which is similar to comparison exam. Dual lead pacer pack overlies the left chest wall with the lead at the cardiac apex and right atrium. No pneumothorax. No visible effusion. No acute osseous or soft tissue abnormality. IMPRESSION: 1. Unchanged bilateral diffuse interstitial opacities and central venous congestion likely reflecting some interstitial edema. 2. Stable cardiomegaly. Electronically Signed   By: Lovena Le M.D.   On: 06/01/2019 17:04    EKG:   Orders placed or performed during the hospital encounter of 06/01/19  . EKG 12-Lead  . EKG 12-Lead    IMPRESSION AND PLAN:  Principal Problem:   Hypertensive heart disease with heart failure ([place]) -patient's blood pressure has been soft, however she will likely need further diuresis.  She states that her torsemide at home has not been helping.  Once her blood pressure improves we will start her likely on a Lasix drip.  She may need a cardiology consult Active Problems:   Asthma exacerbation -steroids, inhalers, supportive treatment   OSA (obstructive sleep apnea) -CPAP nightly   Diabetes mellitus (HCC) -sliding scale insulin coverage   Essential hypertension -hold antihypertensives for now as her blood pressure has been soft  Chart review performed and case discussed with ED provider. Labs, imaging and/or ECG reviewed by provider and discussed with patient/family. Management plans discussed with the patient and/or family.  COVID-19 status: Tested negative     DVT PROPHYLAXIS: SubQ lovenox   GI PROPHYLAXIS:  None  ADMISSION STATUS: Inpatient     CODE STATUS: Full Code Status History    Date Active Date Inactive Code Status Order ID Comments User Context   05/19/2019 1159 05/26/2019 2135 Full Code [id number]  Hillary Bow, MD ED   02/23/2019 1918 02/25/2019 1621 Full Code [id number]  Lang Snow, NP ED   04/18/2018 0209 04/20/2018 1930 Full Code [id number]  Arta Silence, MD Inpatient    09/05/2016 0305 09/11/2016 1601 Full Code [id number]  Hugelmeyer, Ubaldo Glassing, DO Inpatient   Advance Care Planning Activity      TOTAL TIME TAKING CARE OF THIS PATIENT: 45 minutes.   This patient was evaluated in the context of the global COVID-19 pandemic, which necessitated consideration that the patient might be at risk for infection with the SARS-CoV-2 virus that causes COVID-19. Institutional protocols and algorithms that pertain to the evaluation of patients at risk for COVID-19 are in a state of rapid change based on information released by regulatory bodies including the CDC and federal and state organizations. These policies and algorithms were followed to the best of this provider's knowledge to date during the patient's care at this facility.  [place]  Lamyia Cdebaca 06/01/2019, 10:28 PM  [employer]  [phone]  CC: Primary care physician; [facility]  Note:  This document was prepared using Dragon voice recognition software and may include unintentional dictation errors.

## 2019-06-01 NOTE — ED Triage Notes (Signed)
Pt c/o cough, body aches, tired since Monday - reports wheezing with hx of asthma

## 2019-06-01 NOTE — ED Notes (Signed)
ED TO INPATIENT HANDOFF REPORT  ED Nurse Name and Phone #:  Anson Crofts Name/Age/Gender Julie Bender 58 y.o. female Room/Bed: ED03A/ED03A  Code Status   Code Status: Prior  Home/SNF/Other Home Patient oriented to: self, place, time and situation Is this baseline? Yes   Triage Complete: Triage complete  Chief Complaint EMS Chills/body aches  Triage Note Pt c/o cough, body aches, tired since Monday - reports wheezing with hx of asthma   Allergies No Known Allergies  Level of Care/Admitting Diagnosis ED Disposition    ED Disposition Condition [facility] Area: [place] [[id number]]  Level of Care: Med-Surg [16]  Covid Evaluation: Confirmed COVID Negative  Diagnosis: Asthma exacerbation [id number]  Admitting Physician: Lance Coon [id number]  Attending Physician: Lance Coon [id number]  Estimated length of stay: past midnight tomorrow  Certification:: I certify this patient will need inpatient services for at least 2 midnights  PT Class (Do Not Modify): Inpatient [101]  PT Acc Code (Do Not Modify): Private [1]       B Medical/Surgery History Past Medical History:  Diagnosis Date  . CHF (congestive heart failure) ([place])   . COPD (chronic obstructive pulmonary disease) ([place])   . Diabetes mellitus without complication ([place])   . Diabetic retinopathy ([place])   . History of placement of internal cardiac defibrillator   . Hypertension   . Hypertrophic cardiomyopathy ([place])    Past Surgical History:  Procedure Laterality Date  . CARDIAC DEFIBRILLATOR PLACEMENT    . CHOLECYSTECTOMY       A IV Location/Drains/Wounds Patient Lines/Drains/Airways Status   Active Line/Drains/Airways    Name:   Placement date:   Placement time:   Site:   Days:   Peripheral IV 06/01/19 Right Arm   06/01/19    1836    Arm   less than 1          Intake/Output Last 24 hours No intake or output data in the 24 hours ending 06/01/19  2343  Labs/Imaging Results for orders placed or performed during the hospital encounter of 06/01/19 (from the past 48 hour(s))  Blood gas, venous     Status: Abnormal   Collection Time: 06/01/19  5:05 PM  Result Value Ref Range   pH, Ven 7.36 7.250 - 7.430   pCO2, Ven 48 44.0 - 60.0 mmHg   pO2, Ven <31.0 (LL) 32.0 - 45.0 mmHg   Bicarbonate 27.1 20.0 - 28.0 mmol/L   Acid-Base Excess 1.0 0.0 - 2.0 mmol/L   O2 Saturation 44.7 %   Patient temperature 37.0    Collection site VENOUS    Sample type VENOUS     Comment: Performed at [facility], [address] [place], [place] [address]  CBC with Differential     Status: Abnormal   Collection Time: 06/01/19  6:14 PM  Result Value Ref Range   WBC 8.4 4.0 - 10.5 K/uL   RBC 3.77 (L) 3.87 - 5.11 MIL/uL   Hemoglobin 10.1 (L) 12.0 - 15.0 g/dL   HCT 32.2 (L) 36.0 - 46.0 %   MCV 85.4 80.0 - 100.0 fL   MCH 26.8 26.0 - 34.0 pg   MCHC 31.4 30.0 - 36.0 g/dL   RDW 15.3 11.5 - 15.5 %   Platelets 320 150 - 400 K/uL   nRBC 0.0 0.0 - 0.2 %   Neutrophils Relative % 62 %   Neutro Abs 5.3 1.7 - 7.7 K/uL   Lymphocytes Relative 19 %  Lymphs Abs 1.6 0.7 - 4.0 K/uL   Monocytes Relative 13 %   Monocytes Absolute 1.1 (H) 0.1 - 1.0 K/uL   Eosinophils Relative 5 %   Eosinophils Absolute 0.4 0.0 - 0.5 K/uL   Basophils Relative 0 %   Basophils Absolute 0.0 0.0 - 0.1 K/uL   Immature Granulocytes 1 %   Abs Immature Granulocytes 0.04 0.00 - 0.07 K/uL    Comment: Performed at [facility], [address] [place], [place] [address]  Comprehensive metabolic panel     Status: Abnormal   Collection Time: 06/01/19  6:14 PM  Result Value Ref Range   Sodium 139 135 - 145 mmol/L   Potassium 4.3 3.5 - 5.1 mmol/L   Chloride 106 98 - 111 mmol/L   CO2 27 22 - 32 mmol/L   Glucose, Bld 109 (H) 70 - 99 mg/dL   BUN 21 (H) 6 - 20 mg/dL   Creatinine, Ser 1.16 (H) 0.44 - 1.00 mg/dL   Calcium 8.6 (L) 8.9 - 10.3 mg/dL   Total Protein 6.6 6.5 - 8.1 g/dL    Albumin 3.5 3.5 - 5.0 g/dL   AST 15 15 - 41 U/L   ALT 11 0 - 44 U/L   Alkaline Phosphatase 66 38 - 126 U/L   Total Bilirubin 0.5 0.3 - 1.2 mg/dL   GFR calc non Af Amer 51 (L) >60 mL/min   GFR calc Af Amer 59 (L) >60 mL/min   Anion gap 6 5 - 15    Comment: Performed at [facility], [address] [place], [place] [address]  Brain natriuretic peptide     Status: Abnormal   Collection Time: 06/01/19  6:14 PM  Result Value Ref Range   B Natriuretic Peptide 358.0 (H) 0.0 - 100.0 pg/mL    Comment: Performed at [facility], [place], [place] [address]  Troponin I (High Sensitivity)     Status: Abnormal   Collection Time: 06/01/19  6:14 PM  Result Value Ref Range   Troponin I (High Sensitivity) 56 (H) <18 ng/L    Comment: (NOTE) Elevated high sensitivity troponin I (hsTnI) values and significant  changes across serial measurements may suggest ACS but many other  chronic and acute conditions are known to elevate hsTnI results.  Refer to the "Links" section for chest pain algorithms and additional  guidance. Performed at [facility], [address] [place], [place] [address]   Lactic acid, plasma     Status: None   Collection Time: 06/01/19  6:14 PM  Result Value Ref Range   Lactic Acid, Venous 1.9 0.5 - 1.9 mmol/L    Comment: Performed at [facility], [address] [place], [place] [address]  SARS Coronavirus 2 by RT PCR (hospital order, performed in [facility] hospital lab) Nasopharyngeal Nasopharyngeal Swab     Status: None   Collection Time: 06/01/19  6:18 PM   Specimen: Nasopharyngeal Swab  Result Value Ref Range   SARS Coronavirus 2 NEGATIVE NEGATIVE    Comment: (NOTE) If result is NEGATIVE SARS-CoV-2 target nucleic acids are NOT DETECTED. The SARS-CoV-2 RNA is generally detectable in upper and lower  respiratory specimens during the acute phase of infection. The lowest  concentration of SARS-CoV-2 viral copies  this assay can detect is 250  copies / mL. A negative result does not preclude SARS-CoV-2 infection  and should not be used as the sole basis for treatment or other  patient management decisions.  A negative result may occur with  improper specimen collection / handling, submission of specimen other  than nasopharyngeal swab, presence of viral mutation(s) within the  areas targeted by this assay, and inadequate number of viral copies  (<250 copies / mL). A negative result must be combined with clinical  observations, patient history, and epidemiological information. If result is POSITIVE SARS-CoV-2 target nucleic acids are DETECTED. The SARS-CoV-2 RNA is generally detectable in upper and lower  respiratory specimens dur ing the acute phase of infection.  Positive  results are indicative of active infection with SARS-CoV-2.  Clinical  correlation with patient history and other diagnostic information is  necessary to determine patient infection status.  Positive results do  not rule out bacterial infection or co-infection with other viruses. If result is PRESUMPTIVE POSTIVE SARS-CoV-2 nucleic acids MAY BE PRESENT.   A presumptive positive result was obtained on the submitted specimen  and confirmed on repeat testing.  While 2019 novel coronavirus  (SARS-CoV-2) nucleic acids may be present in the submitted sample  additional confirmatory testing may be necessary for epidemiological  and / or clinical management purposes  to differentiate between  SARS-CoV-2 and other Sarbecovirus currently known to infect humans.  If clinically indicated additional testing with an alternate test  methodology [phone]) is advised. The SARS-CoV-2 RNA is generally  detectable in upper and lower respiratory sp ecimens during the acute  phase of infection. The expected result is Negative. Fact Sheet for Patients:  StrictlyIdeas.no Fact Sheet for Healthcare  Providers: BankingDealers.co.za This test is not yet approved or cleared by the [place] FDA and has been authorized for detection and/or diagnosis of SARS-CoV-2 by FDA under an Emergency Use Authorization (EUA).  This EUA will remain in effect (meaning this test can be used) for the duration of the COVID-19 declaration under Section 564(b)(1) of the Act, 21 U.S.C. section 360bbb-3(b)(1), unless the authorization is terminated or revoked sooner. Performed at [facility], [address] [place], [place] [address]   Lactic acid, plasma     Status: None   Collection Time: 06/01/19  7:19 PM  Result Value Ref Range   Lactic Acid, Venous 1.6 0.5 - 1.9 mmol/L    Comment: Performed at [facility], [place], [place] [address]  Troponin I (High Sensitivity)     Status: Abnormal   Collection Time: 06/01/19  7:19 PM  Result Value Ref Range   Troponin I (High Sensitivity) 56 (H) <18 ng/L    Comment: (NOTE) Elevated high sensitivity troponin I (hsTnI) values and significant  changes across serial measurements may suggest ACS but many other  chronic and acute conditions are known to elevate hsTnI results.  Refer to the "Links" section for chest pain algorithms and additional  guidance. Performed at [facility], [address] [place], [place] [address]   Influenza panel by PCR (type A & B)     Status: None   Collection Time: 06/01/19  9:58 PM  Result Value Ref Range   Influenza A By PCR NEGATIVE NEGATIVE   Influenza B By PCR NEGATIVE NEGATIVE    Comment: (NOTE) The Xpert Xpress Flu assay is intended as an aid in the diagnosis of  influenza and should not be used as a sole basis for treatment.  This  assay is FDA approved for nasopharyngeal swab specimens only. Nasal  washings and aspirates are unacceptable for Xpert Xpress Flu testing. Performed at [facility], [address] [place],  [place] [address]     Dg Chest Portable 1 View  Result Date: 06/01/2019 CLINICAL DATA:  Cough, body aches and fatigue for 5 days EXAM: PORTABLE CHEST 1 VIEW COMPARISON:  Radiograph 05/25/2019, CT angio 05/19/2019 FINDINGS: Hazy interstitial opacities throughout the lungs with a basilar predominance are mild central venous congestion with cardiomegaly which is similar to comparison exam. Dual lead pacer pack overlies the left chest wall with the lead at the cardiac apex and right atrium. No pneumothorax. No visible effusion. No acute osseous or soft tissue abnormality. IMPRESSION: 1. Unchanged bilateral diffuse interstitial opacities and central venous congestion likely reflecting some interstitial edema. 2. Stable cardiomegaly. Electronically Signed   By: Lovena Le M.D.   On: 06/01/2019 17:04    Pending Labs [employer] (From admission, onward)    Start     Ordered   Signed and Held  CBC  (enoxaparin (LOVENOX)    CrCl >/= 30 ml/min)  Once,   R    Comments: Baseline for enoxaparin therapy IF NOT ALREADY DRAWN.  Notify MD if PLT < 100 K.    Signed and Held   Signed and Held  Creatinine, serum  (enoxaparin (LOVENOX)    CrCl >/= 30 ml/min)  Once,   R    Comments: Baseline for enoxaparin therapy IF NOT ALREADY DRAWN.    Signed and Held   Signed and Held  Creatinine, serum  (enoxaparin (LOVENOX)    CrCl >/= 30 ml/min)  Weekly,   R    Comments: while on enoxaparin therapy    Signed and Held   Signed and Held  Basic metabolic panel  Tomorrow morning,   R     Signed and Held   Signed and Held  CBC  Tomorrow morning,   R     Signed and Held          Vitals/Pain Today's Vitals   06/01/19 1930 06/01/19 2000 06/01/19 2200 06/01/19 2300  BP: 102/65 98/60 104/72   Pulse: (!) 55 (!) 56 62 63  Resp: (!) 25 18 16 18   Temp:      TempSrc:      SpO2: 100% 100%  93%  Weight:      Height:      PainSc:        Isolation Precautions No active isolations  Medications Medications   chlorpheniramine-HYDROcodone (TUSSIONEX) 10-8 MG/5ML suspension 5 mL (5 mLs Oral Given 06/01/19 1938)  ipratropium-albuterol (DUONEB) 0.5-2.5 (3) MG/3ML nebulizer solution 3 mL (3 mLs Nebulization Given 06/01/19 2202)    Mobility walks Low fall risk   Focused Assessments Pulmonary Assessment Handoff:  Lung sounds:   O2 Device: Nasal Cannula O2 Flow Rate (L/min): 2 L/min      R Recommendations: See Admitting Provider Note  Report given to:   Additional Notes:

## 2019-06-02 ENCOUNTER — Other Ambulatory Visit: Payer: Self-pay

## 2019-06-02 DIAGNOSIS — I110 Hypertensive heart disease with heart failure: Secondary | ICD-10-CM

## 2019-06-02 DIAGNOSIS — I517 Cardiomegaly: Secondary | ICD-10-CM | POA: Diagnosis not present

## 2019-06-02 DIAGNOSIS — R0601 Orthopnea: Secondary | ICD-10-CM

## 2019-06-02 DIAGNOSIS — I421 Obstructive hypertrophic cardiomyopathy: Secondary | ICD-10-CM

## 2019-06-02 DIAGNOSIS — I5033 Acute on chronic diastolic (congestive) heart failure: Secondary | ICD-10-CM

## 2019-06-02 LAB — GLUCOSE, CAPILLARY
Glucose-Capillary: 140 mg/dL — ABNORMAL HIGH (ref 70–99)
Glucose-Capillary: 168 mg/dL — ABNORMAL HIGH (ref 70–99)
Glucose-Capillary: 237 mg/dL — ABNORMAL HIGH (ref 70–99)
Glucose-Capillary: 344 mg/dL — ABNORMAL HIGH (ref 70–99)
Glucose-Capillary: 288 mg/dL — ABNORMAL HIGH (ref 70–99)

## 2019-06-02 LAB — BASIC METABOLIC PANEL
Anion gap: 11 (ref 5–15)
BUN: 20 mg/dL (ref 6–20)
CO2: 25 mmol/L (ref 22–32)
Calcium: 8.6 mg/dL — ABNORMAL LOW (ref 8.9–10.3)
Chloride: 103 mmol/L (ref 98–111)
Creatinine, Ser: 1.03 mg/dL — ABNORMAL HIGH (ref 0.44–1.00)
GFR calc Af Amer: >60 mL/min (ref >60)
GFR calc non Af Amer: 59 mL/min — ABNORMAL LOW (ref >60)
Glucose, Bld: 224 mg/dL — ABNORMAL HIGH (ref 70–99)
Potassium: 3.7 mmol/L (ref 3.5–5.1)
Sodium: 139 mmol/L (ref 135–145)

## 2019-06-02 LAB — CBC
HCT: 32.5 % — ABNORMAL LOW (ref 36.0–46.0)
Hemoglobin: 10.2 g/dL — ABNORMAL LOW (ref 12.0–15.0)
MCH: 26.7 pg (ref 26.0–34.0)
MCHC: 31.4 g/dL (ref 30.0–36.0)
MCV: 85.1 fL (ref 80.0–100.0)
Platelets: 301 10*3/uL (ref 150–400)
RBC: 3.82 MIL/uL — ABNORMAL LOW (ref 3.87–5.11)
RDW: 15.4 % (ref 11.5–15.5)
WBC: 7.3 10*3/uL (ref 4.0–10.5)
nRBC: 0 % (ref 0.0–0.2)

## 2019-06-02 MED ORDER — IPRATROPIUM-ALBUTEROL 20-100 MCG/ACT IN AERS
1.0000 | INHALATION_SPRAY | Freq: Four times a day (QID) | RESPIRATORY_TRACT | Status: DC | PRN
  Administered 2019-06-02: 09:00:00 1 via RESPIRATORY_TRACT
  Filled 2019-06-02: qty 4

## 2019-06-02 MED ORDER — SODIUM CHLORIDE 0.9% FLUSH
3.0000 mL | INTRAVENOUS | Status: DC | PRN
  Administered 2019-06-02 – 2019-06-03 (×3): 3 mL via INTRAVENOUS
  Filled 2019-06-02 (×3): qty 3

## 2019-06-02 MED ORDER — GUAIFENESIN-DM 100-10 MG/5ML PO SYRP
10.0000 mL | ORAL_SOLUTION | ORAL | Status: DC | PRN
  Administered 2019-06-02 – 2019-06-06 (×8): 10 mL via ORAL
  Filled 2019-06-02 (×8): qty 10

## 2019-06-02 MED ORDER — TRAZODONE HCL 50 MG PO TABS
150.0000 mg | ORAL_TABLET | Freq: Every day | ORAL | Status: DC
  Administered 2019-06-02: 150 mg via ORAL
  Filled 2019-06-02: qty 3

## 2019-06-02 MED ORDER — FUROSEMIDE 10 MG/ML IJ SOLN
8.0000 mg/h | INTRAVENOUS | Status: DC
  Administered 2019-06-02: 03:00:00 4 mg/h via INTRAVENOUS
  Filled 2019-06-02: qty 25

## 2019-06-02 MED ORDER — FUROSEMIDE 10 MG/ML IJ SOLN
40.0000 mg | Freq: Two times a day (BID) | INTRAMUSCULAR | Status: DC
  Administered 2019-06-02 – 2019-06-04 (×4): 40 mg via INTRAVENOUS
  Filled 2019-06-02 (×4): qty 4

## 2019-06-02 MED ORDER — ACETAMINOPHEN 650 MG RE SUPP: 650.0000 mg | Freq: Four times a day (QID) | RECTAL | Status: DC | PRN

## 2019-06-02 MED ORDER — SODIUM CHLORIDE 0.9% FLUSH
3.0000 mL | Freq: Two times a day (BID) | INTRAVENOUS | Status: DC
  Administered 2019-06-02 – 2019-06-09 (×15): 3 mL via INTRAVENOUS

## 2019-06-02 MED ORDER — INSULIN ASPART 100 UNIT/ML ~~LOC~~ SOLN
0.0000 [IU] | Freq: Every day | SUBCUTANEOUS | Status: DC
  Administered 2019-06-02: 22:00:00 3 [IU] via SUBCUTANEOUS
  Filled 2019-06-02: qty 1

## 2019-06-02 MED ORDER — BISACODYL 10 MG RE SUPP
10.0000 mg | Freq: Every day | RECTAL | Status: DC | PRN
  Administered 2019-06-03: 17:00:00 10 mg via RECTAL
  Filled 2019-06-02: qty 1

## 2019-06-02 MED ORDER — FUROSEMIDE 10 MG/ML IJ SOLN
8.0000 mg/h | INTRAVENOUS | Status: DC
  Filled 2019-06-02: qty 25

## 2019-06-02 MED ORDER — IPRATROPIUM-ALBUTEROL 0.5-2.5 (3) MG/3ML IN SOLN
3.0000 mL | Freq: Four times a day (QID) | RESPIRATORY_TRACT | Status: DC
  Administered 2019-06-02 – 2019-06-04 (×7): 3 mL via RESPIRATORY_TRACT
  Filled 2019-06-02 (×6): qty 3

## 2019-06-02 MED ORDER — CITALOPRAM HYDROBROMIDE 20 MG PO TABS
20.0000 mg | ORAL_TABLET | Freq: Every day | ORAL | Status: DC
  Administered 2019-06-02 – 2019-06-06 (×5): 20 mg via ORAL
  Filled 2019-06-02 (×5): qty 1

## 2019-06-02 MED ORDER — PANTOPRAZOLE SODIUM 40 MG PO TBEC
40.0000 mg | DELAYED_RELEASE_TABLET | Freq: Two times a day (BID) | ORAL | Status: DC
  Administered 2019-06-02 – 2019-06-09 (×15): 40 mg via ORAL
  Filled 2019-06-02 (×15): qty 1

## 2019-06-02 MED ORDER — ATORVASTATIN CALCIUM 20 MG PO TABS
40.0000 mg | ORAL_TABLET | Freq: Every day | ORAL | Status: DC
  Administered 2019-06-02 – 2019-06-08 (×7): 40 mg via ORAL
  Filled 2019-06-02 (×7): qty 2

## 2019-06-02 MED ORDER — ENOXAPARIN SODIUM 40 MG/0.4ML ~~LOC~~ SOLN
40.0000 mg | SUBCUTANEOUS | Status: DC
  Administered 2019-06-02 – 2019-06-09 (×8): 40 mg via SUBCUTANEOUS
  Filled 2019-06-02 (×8): qty 0.4

## 2019-06-02 MED ORDER — ONDANSETRON HCL 4 MG/2ML IJ SOLN: 4.0000 mg | Freq: Four times a day (QID) | INTRAMUSCULAR | Status: DC | PRN

## 2019-06-02 MED ORDER — BISACODYL 10 MG RE SUPP: 10.0000 mg | Freq: Every day | RECTAL | Status: DC

## 2019-06-02 MED ORDER — ACETAMINOPHEN 325 MG PO TABS
650.0000 mg | ORAL_TABLET | Freq: Four times a day (QID) | ORAL | Status: DC | PRN
  Administered 2019-06-02 – 2019-06-07 (×8): 650 mg via ORAL
  Filled 2019-06-02 (×8): qty 2

## 2019-06-02 MED ORDER — METOPROLOL TARTRATE 25 MG PO TABS
12.5000 mg | ORAL_TABLET | Freq: Two times a day (BID) | ORAL | Status: DC
  Administered 2019-06-02 – 2019-06-05 (×7): 12.5 mg via ORAL
  Filled 2019-06-02 (×7): qty 1

## 2019-06-02 MED ORDER — INSULIN ASPART 100 UNIT/ML ~~LOC~~ SOLN
0.0000 [IU] | Freq: Three times a day (TID) | SUBCUTANEOUS | Status: DC
  Administered 2019-06-02: 17:00:00 7 [IU] via SUBCUTANEOUS
  Administered 2019-06-02: 2 [IU] via SUBCUTANEOUS
  Administered 2019-06-02: 3 [IU] via SUBCUTANEOUS
  Filled 2019-06-02 (×3): qty 1

## 2019-06-02 MED ORDER — GUAIFENESIN-DM 100-10 MG/5ML PO SYRP
5.0000 mL | ORAL_SOLUTION | ORAL | Status: DC | PRN
  Administered 2019-06-02 (×2): 5 mL via ORAL
  Filled 2019-06-02 (×2): qty 5

## 2019-06-02 MED ORDER — ONDANSETRON HCL 4 MG PO TABS: 4.0000 mg | ORAL_TABLET | Freq: Four times a day (QID) | ORAL | Status: DC | PRN

## 2019-06-02 MED ORDER — ASPIRIN EC 81 MG PO TBEC
81.0000 mg | DELAYED_RELEASE_TABLET | Freq: Every day | ORAL | Status: DC
  Administered 2019-06-02 – 2019-06-09 (×8): 81 mg via ORAL
  Filled 2019-06-02 (×8): qty 1

## 2019-06-02 MED ORDER — POLYETHYLENE GLYCOL 3350 17 G PO PACK
17.0000 g | PACK | Freq: Every day | ORAL | Status: DC
  Administered 2019-06-02 – 2019-06-09 (×7): 17 g via ORAL
  Filled 2019-06-02 (×7): qty 1

## 2019-06-02 MED ORDER — ALBUTEROL SULFATE (2.5 MG/3ML) 0.083% IN NEBU
2.5000 mg | INHALATION_SOLUTION | RESPIRATORY_TRACT | Status: DC | PRN
  Administered 2019-06-03: 04:00:00 2.5 mg via RESPIRATORY_TRACT
  Filled 2019-06-02: qty 3

## 2019-06-02 MED ORDER — PREDNISONE 20 MG PO TABS
40.0000 mg | ORAL_TABLET | Freq: Every day | ORAL | Status: DC
  Administered 2019-06-02: 08:00:00 40 mg via ORAL
  Filled 2019-06-02: qty 2

## 2019-06-02 NOTE — Progress Notes (Addendum)
[facility] [place] NAME: Julie Bender    MR#:  [id number]  DATE OF BIRTH:  [birth year]  SUBJECTIVE:  CHIEF COMPLAINT: Patient is reporting approximately 4 pounds weight gain in 5 to 6days though she is compliant with her medications and reporting not making good amount of urine on the current Lasix drip.  Denies any chest pain but reporting abdominal swelling  REVIEW OF SYSTEMS:  CONSTITUTIONAL: No fever, fatigue or weakness.  EYES: No blurred or double vision.  EARS, NOSE, AND THROAT: No tinnitus or ear pain.  RESPIRATORY: No cough, reports shortness of breath with minimal exertion, dry cough, denies wheezing or hemoptysis.  CARDIOVASCULAR: No chest pain, orthopnea, edema.  GASTROINTESTINAL: No nausea, vomiting, diarrhea or abdominal pain.  GENITOURINARY: No dysuria, hematuria.  ENDOCRINE: No polyuria, nocturia,  HEMATOLOGY: No anemia, easy bruising or bleeding SKIN: No rash or lesion. MUSCULOSKELETAL: No joint pain or arthritis.   NEUROLOGIC: No tingling, numbness, weakness.  PSYCHIATRY: No anxiety or depression.   DRUG ALLERGIES:  No Known Allergies  VITALS:  Blood pressure 124/70, pulse 75, temperature 97.8 F (36.6 C), temperature source Oral, resp. rate 20, height 5\' 4"  (1.626 m), weight 98.3 kg, SpO2 97 %.  PHYSICAL EXAMINATION:  GENERAL:  58 y.o.-year-old patient lying in the bed with no acute distress.  EYES: Pupils equal, round, reactive to light and accommodation. No scleral icterus. Extraocular muscles intact.  HEENT: Head atraumatic, normocephalic. Oropharynx and nasopharynx clear.  NECK:  Supple, no jugular venous distention. No thyroid enlargement, no tenderness.  LUNGS: Normal breath sounds bilaterally, no wheezing, rales,rhonchi or crepitation. No use of accessory muscles of respiration.  CARDIOVASCULAR: S1, S2 normal. No murmurs, rubs, or gallops.  ABDOMEN: Soft, nontender, nondistended. Bowel sounds present.   Abdominal swelling EXTREMITIES: No pedal edema, cyanosis, or clubbing.  NEUROLOGIC: Cranial nerves II through XII are intact. Muscle strength 5/5 in all extremities. Sensation intact. Gait not checked.  PSYCHIATRIC: The patient is alert and oriented x 3.  SKIN: No obvious rash, lesion, or ulcer.    LABORATORY PANEL:   CBC Recent Labs  Lab 06/02/19 0449  WBC 7.3  HGB 10.2*  HCT 32.5*  PLT 301   ------------------------------------------------------------------------------------------------------------------  Chemistries  Recent Labs  Lab 06/01/19 1814 06/02/19 0449  NA 139 139  K 4.3 3.7  CL 106 103  CO2 27 25  GLUCOSE 109* 224*  BUN 21* 20  CREATININE 1.16* 1.03*  CALCIUM 8.6* 8.6*  AST 15  --   ALT 11  --   ALKPHOS 66  --   BILITOT 0.5  --    ------------------------------------------------------------------------------------------------------------------  Cardiac Enzymes No results for input(s): TROPONINI in the last 168 hours. ------------------------------------------------------------------------------------------------------------------  RADIOLOGY:  Dg Chest Portable 1 View  Result Date: 06/01/2019 CLINICAL DATA:  Cough, body aches and fatigue for 5 days EXAM: PORTABLE CHEST 1 VIEW COMPARISON:  Radiograph 05/25/2019, CT angio 05/19/2019 FINDINGS: Hazy interstitial opacities throughout the lungs with a basilar predominance are mild central venous congestion with cardiomegaly which is similar to comparison exam. Dual lead pacer pack overlies the left chest wall with the lead at the cardiac apex and right atrium. No pneumothorax. No visible effusion. No acute osseous or soft tissue abnormality. IMPRESSION: 1. Unchanged bilateral diffuse interstitial opacities and central venous congestion likely reflecting some interstitial edema. 2. Stable cardiomegaly. Electronically Signed   By: Lovena Le M.D.   On: 06/01/2019 17:04    EKG:   Orders placed or performed  during the hospital encounter of 06/01/19  . EKG 12-Lead  . EKG 12-Lead    ASSESSMENT AND PLAN:   #Acute respiratory distress secondary to acute CHF with hypertensive heart disease, reactive airway disease exacerbation, hypertrophic obstructive cardiomyopathy On Lasix drip which is changed to Lasix 40 mg IV twice daily as patient does not clinically look significantly fluid overloaded and cardiology is not recommending over diuresing the patient  monitor daily weights, intake and output Consulted primary cardiology group-[facility] Metoprolol is added to the regimen If patient is clinically stable they will consider adding verapamil COVID-19 is negative  #Acute reactive airway disease- on steroids, inhalers and supportive treatment as needed  #Obstructive sleep apnea CPAP nightly  #Diabetes mellitus sliding scale insulin and monitor sugars closely while patient is on steroids  #Essential hypertension blood pressure being soft holding all home blood pressure medications     All the records are reviewed and case discussed with Care Management/Social Workerr. Management plans discussed with the patient, she is in agreement.  CODE STATUS: fc  TOTAL TIME TAKING CARE OF THIS PATIENT: 35 minutes.   POSSIBLE D/C IN 2 DAYS, DEPENDING ON CLINICAL CONDITION.  Note: This dictation was prepared with Dragon dictation along with smaller phrase technology. Any transcriptional errors that result from this process are unintentional.   Nicholes Mango M.D on 06/02/2019 at 2:35 PM  Between 7am to 6pm - Pager - [phone] After 6pm go to [email or web address] - password [email or web address] [facility]  [facility]  Office  [phone]  CC: Primary care physician; [facility], [place]

## 2019-06-02 NOTE — Consult Note (Signed)
Cardiology Consultation:   Patient ID: Julie Bender MRN: [id number]; DOB: 27-Jun-1961  Admit date: 06/01/2019 Date of Consult: 06/02/2019  Primary Care Provider: [facility] [place] [facility] Primary Cardiologist: Ida Rogue, MD    Patient Profile:   Julie Bender is a 58 y.o. female with a hx of HOCM, severe LVH, ICD placement, COPD, chronic diastolic heart failure who is being seen today for the evaluation of shortness of breath at the request of Dr. Margaretmary Eddy.  History of Present Illness:   Julie Bender was recently discharged from the hospital on 05/26/19. Notes reviewed from that hospitalization. She has had a cough since 05/28/19. Noted that she initially felt short of breath, had headache with coughing. This progressed to shortness of breath, orthopnea constantly. Weights at home have been up and down since discharge, largely 211-212 lb range, lowest 208 lbs (prior to admission last week). Weight here was 216 lbs. Cough largely nonproductive (milkly when it is productive). No fevers/chills. She is worried that when she lays down, she might not wake up. Home nurse was present yesterday, told her BP was low. BP in ER was 100/49.  She feels like she is "drowning" in fluid, can't catch her breath. Hasn't slept in days per report. Doesn't use CPAP routinely at home (needs a new plug), does have OSA. Thinks she needs home O2 but numbers in the hospital was always too good for O2.  Flu panel and COVID negative. hsTroponins 56->56, delta of zero, lower than recent admission. Lactate normal. BNP 358 (prior 463, 1 year ago 875). CXR with stable bilateral diffuse interstitial opacities, central venous congestion, stable cardiomegaly.  Saw pulmonologist, told she has asthma and COPD. Felt better with nebulizer but doesn't feel well when she goes home.  Heart Pathway Score:     Past Medical History:  Diagnosis Date  . CHF (congestive heart failure) ([place])   . COPD (chronic obstructive pulmonary  disease) ([place])   . Diabetes mellitus without complication ([place])   . Diabetic retinopathy ([place])   . History of placement of internal cardiac defibrillator   . Hypertension   . Hypertrophic cardiomyopathy ([place])     Past Surgical History:  Procedure Laterality Date  . CARDIAC DEFIBRILLATOR PLACEMENT    . CHOLECYSTECTOMY       Home Medications:  Prior to Admission medications   Medication Sig Start Date End Date Taking? Authorizing Provider  aspirin EC 81 MG tablet Take 81 mg by mouth daily.   Yes [provider]  atorvastatin (LIPITOR) 40 MG tablet Take 1 tablet (40 mg total) by mouth daily at 6 PM. Patient taking differently: Take 40 mg by mouth daily.  04/20/18  Yes Salary, Avel Peace, MD  gabapentin (NEURONTIN) 300 MG capsule Take 1 capsule (300 mg total) by mouth 2 (two) times daily. 05/26/19  Yes Vaughan Basta, MD  insulin aspart (NOVOLOG FLEXPEN) 100 UNIT/ML FlexPen Inject 13 Units into the skin 3 (three) times daily with meals.    Yes [provider]  insulin detemir (LEVEMIR) 100 UNIT/ML injection Inject 42 Units into the skin at bedtime.   Yes [provider]  metoprolol succinate (TOPROL-XL) 25 MG 24 hr tablet Take 1 tablet (25 mg total) by mouth daily. 05/27/19  Yes Vaughan Basta, MD  pantoprazole (PROTONIX) 40 MG tablet Take 1 tablet (40 mg total) by mouth 2 (two) times daily before a meal. 05/26/19  Yes Vaughan Basta, MD  torsemide (DEMADEX) 20 MG tablet Take 2 tablets (40 mg total) by mouth 2 (two)  times daily. 05/26/19  Yes Vaughan Basta, MD  traZODone (DESYREL) 150 MG tablet Take 150 mg by mouth at bedtime. 04/02/16  Yes [provider]  verapamil (CALAN-SR) 240 MG CR tablet Take 1 tablet (240 mg) by mouth once daily 03/05/19  Yes Gollan, Kathlene November, MD  albuterol (PROVENTIL HFA;VENTOLIN HFA) 108 (90 Base) MCG/ACT inhaler Inhale 2 puffs into the lungs every 6 (six) hours as needed for wheezing or shortness of  breath. 04/20/18   Salary, Avel Peace, MD  citalopram (CELEXA) 20 MG tablet Take 20 mg by mouth daily. 05/03/16 02/23/19  [provider]  metFORMIN (GLUCOPHAGE) 850 MG tablet Take 850 mg by mouth 3 (three) times daily. 05/03/16 05/19/19  [provider]  metolazone (ZAROXOLYN) 2.5 MG tablet Take 1 tablet (2.5 mg total) by mouth daily as needed (For 3 pound weight gain). 05/26/19   Vaughan Basta, MD  nitroGLYCERIN (NITROSTAT) 0.4 MG SL tablet Place 1 tablet (0.4 mg total) under the tongue every 5 (five) minutes as needed for chest pain. 05/20/19   Demetrios Loll, MD    Inpatient Medications: Scheduled Meds: . aspirin EC  81 mg Oral Daily  . atorvastatin  40 mg Oral q1800  . citalopram  20 mg Oral Daily  . enoxaparin (LOVENOX) injection  40 mg Subcutaneous Q24H  . insulin aspart  0-5 Units Subcutaneous QHS  . insulin aspart  0-9 Units Subcutaneous TID WC  . ipratropium-albuterol  3 mL Inhalation QID  . pantoprazole  40 mg Oral BID AC  . polyethylene glycol  17 g Oral Daily  . predniSONE  40 mg Oral Q breakfast  . sodium chloride flush  3 mL Intravenous Q12H   Continuous Infusions: . furosemide (LASIX) infusion 8 mg/hr (06/02/19 0951)   PRN Meds: acetaminophen **OR** acetaminophen, albuterol, bisacodyl, guaiFENesin-dextromethorphan, ondansetron **OR** ondansetron (ZOFRAN) IV, sodium chloride flush  Allergies:   No Known Allergies  Social History:   Social History   Socioeconomic History  . Marital status: Divorced    Spouse name: Not on file  . Number of children: Not on file  . Years of education: Not on file  . Highest education level: Not on file  Occupational History  . Not on file  Social Needs  . Financial resource strain: Not on file  . Food insecurity    Worry: Not on file    Inability: Not on file  . Transportation needs    Medical: Not on file    Non-medical: Not on file  Tobacco Use  . Smoking status: Passive Smoke Exposure - Never Smoker  .  Smokeless tobacco: Never Used  Substance and Sexual Activity  . Alcohol use: No  . Drug use: Never  . Sexual activity: Not on file  Lifestyle  . Physical activity    Days per week: Not on file    Minutes per session: Not on file  . Stress: Not on file  Relationships  . Social Herbalist on phone: Not on file    Gets together: Not on file    Attends religious service: Not on file    Active member of club or organization: Not on file    Attends meetings of clubs or organizations: Not on file    Relationship status: Not on file  . Intimate partner violence    Fear of current or ex partner: Not on file    Emotionally abused: Not on file    Physically abused: Not on file  Forced sexual activity: Not on file  Other Topics Concern  . Not on file  Social History Narrative  . Not on file    Family History:    Family History  Problem Relation Age of Onset  . Hypertrophic cardiomyopathy Sister      ROS:  Please see the history of present illness.  Constitutional: Negative for chills, fever, night sweats, unintentional weight loss  HENT: Negative for ear pain and hearing loss.   Eyes: Negative for loss of vision and eye pain.  Respiratory: Positive for cough, sputum, wheezing.   Cardiovascular: See HPI. Gastrointestinal: Negative for abdominal pain, melena, and hematochezia.  Genitourinary: Negative for dysuria and hematuria.  Musculoskeletal: Negative for falls and myalgias.  Skin: Negative for itching and rash.  Neurological: Negative for focal weakness, focal sensory changes and loss of consciousness.  Endo/Heme/Allergies: Does not bruise/bleed easily.  All other ROS reviewed and negative.     Physical Exam/Data:   Vitals:   06/01/19 2300 06/02/19 0009 06/02/19 0500 06/02/19 0558  BP:  130/67  131/64  Pulse: 63 63  74  Resp: 18 16  18   Temp:  97.8 F (36.6 C)  97.7 F (36.5 C)  TempSrc:  Oral  Oral  SpO2: 93% 93%  95%  Weight:  98.2 kg 98.3 kg    Height:  5\' 4"  (1.626 m)      Intake/Output Summary (Last 24 hours) at 06/02/2019 1213 Last data filed at 06/02/2019 0802 Gross per 24 hour  Intake 19.73 ml  Output 250 ml  Net -230.27 ml   Last 3 Weights 06/02/2019 06/02/2019 06/01/2019  Weight (lbs) 216 lb 11.4 oz 216 lb 7.9 oz 212 lb  Weight (kg) 98.3 kg 98.2 kg 96.163 kg     Body mass index is 37.2 kg/m.  General:  Well nourished, well developed, in no acute distress HEENT: normal Lymph: no adenopathy Neck: no JVD Endocrine:  No thryomegaly Vascular: No carotid bruits; RA pulses 2+ bilaterally Cardiac: distant heart sounds. Normal S1, S2; RRR; 2/6 SM Lungs:  Distant breath sounds with audible expiratory wheeze. No appreciable crackles. Abd: soft, nontender, no hepatomegaly  Ext: no edema Musculoskeletal:  No deformities, BUE and BLE strength normal and equal Skin: warm and dry  Neuro:  CNs 2-12 intact, no focal abnormalities noted Psych:  Normal affect   EKG:  The EKG was personally reviewed and demonstrates:  A paced rhythm at 55 bpm. Telemetry:  Telemetry was personally reviewed and demonstrates:  Predominantly a paced with intermittent sinus rhythm  Relevant CV Studies: Echo 05/21/19  1. Left ventricular ejection fraction, by visual estimation, is >75%. The left ventricle has hyperdynamic function. There is severely increased left ventricular hypertrophy.  2. Definity contrast agent was given IV to delineate the left ventricular endocardial borders.  3. Elevated mean left atrial pressure.  4. Hypertrophic cardiomyopathy.  5. Left ventricular diastolic Doppler parameters are consistent with pseudonormalization pattern of LV diastolic filling.  6. Global right ventricle was not well visualized.The right ventricular size is not well visualized. No increase in right ventricular wall thickness.  7. Left atrial size was mildly dilated.  8. Right atrial size was not well visualized.  9. The pericardium was not well  visualized. 10. Mild aortic valve annular calcification. 11. The mitral valve is degenerative. Mild mitral valve regurgitation. 12. The tricuspid valve is not well visualized. Tricuspid valve regurgitation was not visualized by color flow Doppler. 13. The aortic valve was not well visualized Aortic valve regurgitation  is trivial by color flow Doppler. Mild aortic valve sclerosis without stenosis. 14. The pulmonic valve was not well visualized. Pulmonic valve regurgitation is not visualized by color flow Doppler. 15. The inferior vena cava is normal in size with greater than 50% respiratory variability, suggesting right atrial pressure of 3 mmHg. 16. The interatrial septum was not well visualized.  Laboratory Data:  High Sensitivity Troponin:   Recent Labs  Lab 05/19/19 0835 05/19/19 1146 06/01/19 1814 06/01/19 1919  TROPONINIHS 90* 82* 56* 56*     Chemistry Recent Labs  Lab 06/01/19 1814 06/02/19 0449  NA 139 139  K 4.3 3.7  CL 106 103  CO2 27 25  GLUCOSE 109* 224*  BUN 21* 20  CREATININE 1.16* 1.03*  CALCIUM 8.6* 8.6*  GFRNONAA 51* 59*  GFRAA 59* >60  ANIONGAP 6 11    Recent Labs  Lab 06/01/19 1814  PROT 6.6  ALBUMIN 3.5  AST 15  ALT 11  ALKPHOS 66  BILITOT 0.5   Hematology Recent Labs  Lab 06/01/19 1814 06/02/19 0449  WBC 8.4 7.3  RBC 3.77* 3.82*  HGB 10.1* 10.2*  HCT 32.2* 32.5*  MCV 85.4 85.1  MCH 26.8 26.7  MCHC 31.4 31.4  RDW 15.3 15.4  PLT 320 301   BNP Recent Labs  Lab 06/01/19 1814  BNP 358.0*    DDimer No results for input(s): DDIMER in the last 168 hours.   Radiology/Studies:  Dg Chest Portable 1 View  Result Date: 06/01/2019 CLINICAL DATA:  Cough, body aches and fatigue for 5 days EXAM: PORTABLE CHEST 1 VIEW COMPARISON:  Radiograph 05/25/2019, CT angio 05/19/2019 FINDINGS: Hazy interstitial opacities throughout the lungs with a basilar predominance are mild central venous congestion with cardiomegaly which is similar to comparison  exam. Dual lead pacer pack overlies the left chest wall with the lead at the cardiac apex and right atrium. No pneumothorax. No visible effusion. No acute osseous or soft tissue abnormality. IMPRESSION: 1. Unchanged bilateral diffuse interstitial opacities and central venous congestion likely reflecting some interstitial edema. 2. Stable cardiomegaly. Electronically Signed   By: Lovena Le M.D.   On: 06/01/2019 17:04    Assessment and Plan:   Shortness of breath, orthopnea: No LE edema (which she says is typical), but reports dyspnea and orthopnea with worsening cough.  Her weight has been stable since discharge, and her exam is difficult. No visible JVD, breath sounds distant with wheezing more concerning for pulmonary component. Her weight is up from her prior low but largely stable since discharge.  I think gentle diuresis is reasonable (would change to IV push lasix instead of drip), and would not overdiurese given HOCM/severe LVH/  I think there is a component of pulm disease to this as well.  Her acute on chronic diastolic heart failure is driven by her severe LVH/HOCM. She says that she was told she didn't need to see advanced heart failure, but I think with rapid recurrent admission it would be reasonable to have her assessed. She may need disopyramide, though on my personal review of her images it was difficult for me to assess her peak LV gradient.  Overall this is difficult. She was hypotensive briefly, and metoprolol/verapamil were held. She is now normotensive without these, but these are her HOCM agents to allow for decreased obstruction. Overdiuresis may cause worsening obstruction, and without metoprolol/verapamil on board this would be problematic. I will add back fractionated metoprolol today and hopefully verapamil tomorrow.  For questions or updates, please  contact [place] Please consult [email or web address] for contact info under   Signed, Buford Dresser, MD   06/02/2019 12:13 PM

## 2019-06-03 DIAGNOSIS — I421 Obstructive hypertrophic cardiomyopathy: Secondary | ICD-10-CM | POA: Diagnosis not present

## 2019-06-03 DIAGNOSIS — R0602 Shortness of breath: Secondary | ICD-10-CM

## 2019-06-03 DIAGNOSIS — I110 Hypertensive heart disease with heart failure: Secondary | ICD-10-CM | POA: Diagnosis not present

## 2019-06-03 DIAGNOSIS — I5033 Acute on chronic diastolic (congestive) heart failure: Secondary | ICD-10-CM | POA: Diagnosis not present

## 2019-06-03 LAB — BASIC METABOLIC PANEL
Anion gap: 9 (ref 5–15)
BUN: 20 mg/dL (ref 6–20)
CO2: 23 mmol/L (ref 22–32)
Calcium: 8.9 mg/dL (ref 8.9–10.3)
Chloride: 102 mmol/L (ref 98–111)
Creatinine, Ser: 0.97 mg/dL (ref 0.44–1.00)
GFR calc Af Amer: >60 mL/min (ref >60)
GFR calc non Af Amer: >60 mL/min (ref >60)
Glucose, Bld: 251 mg/dL — ABNORMAL HIGH (ref 70–99)
Potassium: 4.1 mmol/L (ref 3.5–5.1)
Sodium: 134 mmol/L — ABNORMAL LOW (ref 135–145)

## 2019-06-03 LAB — GLUCOSE, CAPILLARY
Glucose-Capillary: 241 mg/dL — ABNORMAL HIGH (ref 70–99)
Glucose-Capillary: 327 mg/dL — ABNORMAL HIGH (ref 70–99)
Glucose-Capillary: 348 mg/dL — ABNORMAL HIGH (ref 70–99)
Glucose-Capillary: 410 mg/dL — ABNORMAL HIGH (ref 70–99)

## 2019-06-03 MED ORDER — INSULIN ASPART 100 UNIT/ML ~~LOC~~ SOLN: 0.0000 [IU] | Freq: Three times a day (TID) | SUBCUTANEOUS | Status: DC

## 2019-06-03 MED ORDER — HYDROCOD POLST-CPM POLST ER 10-8 MG/5ML PO SUER
5.0000 mL | Freq: Two times a day (BID) | ORAL | Status: DC | PRN
  Administered 2019-06-03 – 2019-06-04 (×2): 5 mL via ORAL
  Filled 2019-06-03 (×2): qty 5

## 2019-06-03 MED ORDER — MAGNESIUM SULFATE 2 GM/50ML IV SOLN
2.0000 g | Freq: Once | INTRAVENOUS | Status: AC
  Administered 2019-06-03: 2 g via INTRAVENOUS
  Filled 2019-06-03: qty 50

## 2019-06-03 MED ORDER — METHYLPREDNISOLONE SODIUM SUCC 125 MG IJ SOLR
60.0000 mg | Freq: Three times a day (TID) | INTRAMUSCULAR | Status: DC
  Administered 2019-06-03 – 2019-06-04 (×5): 60 mg via INTRAVENOUS
  Filled 2019-06-03 (×5): qty 2

## 2019-06-03 MED ORDER — INSULIN ASPART 100 UNIT/ML ~~LOC~~ SOLN
15.0000 [IU] | Freq: Once | SUBCUTANEOUS | Status: AC
  Administered 2019-06-03: 23:00:00 15 [IU] via SUBCUTANEOUS
  Filled 2019-06-03: qty 1

## 2019-06-03 MED ORDER — INSULIN ASPART 100 UNIT/ML ~~LOC~~ SOLN
0.0000 [IU] | Freq: Three times a day (TID) | SUBCUTANEOUS | Status: DC
  Administered 2019-06-04 (×2): 11 [IU] via SUBCUTANEOUS
  Administered 2019-06-04: 09:00:00 8 [IU] via SUBCUTANEOUS
  Administered 2019-06-04: 12:00:00 5 [IU] via SUBCUTANEOUS
  Administered 2019-06-05 (×2): 15 [IU] via SUBCUTANEOUS
  Administered 2019-06-05 – 2019-06-06 (×2): 8 [IU] via SUBCUTANEOUS
  Administered 2019-06-06: 11 [IU] via SUBCUTANEOUS
  Administered 2019-06-06: 09:00:00 2 [IU] via SUBCUTANEOUS
  Administered 2019-06-07: 13:00:00 5 [IU] via SUBCUTANEOUS
  Administered 2019-06-07: 10:00:00 2 [IU] via SUBCUTANEOUS
  Administered 2019-06-07: 8 [IU] via SUBCUTANEOUS
  Administered 2019-06-07 – 2019-06-08 (×3): 15 [IU] via SUBCUTANEOUS
  Administered 2019-06-08: 11 [IU] via SUBCUTANEOUS
  Administered 2019-06-09: 5 [IU] via SUBCUTANEOUS
  Filled 2019-06-03 (×18): qty 1

## 2019-06-03 MED ORDER — INSULIN ASPART 100 UNIT/ML ~~LOC~~ SOLN
0.0000 [IU] | Freq: Three times a day (TID) | SUBCUTANEOUS | Status: DC
  Administered 2019-06-03: 12:00:00 11 [IU] via SUBCUTANEOUS
  Administered 2019-06-03: 5 [IU] via SUBCUTANEOUS
  Administered 2019-06-03: 17:00:00 11 [IU] via SUBCUTANEOUS
  Filled 2019-06-03 (×3): qty 1

## 2019-06-03 MED ORDER — ZOLPIDEM TARTRATE 5 MG PO TABS
5.0000 mg | ORAL_TABLET | Freq: Every day | ORAL | Status: DC
  Administered 2019-06-03 – 2019-06-05 (×3): 5 mg via ORAL
  Filled 2019-06-03 (×3): qty 1

## 2019-06-03 MED ORDER — FLEET ENEMA 7-19 GM/118ML RE ENEM: 1.0000 | ENEMA | Freq: Every day | RECTAL | Status: DC | PRN

## 2019-06-03 MED ORDER — INSULIN ASPART 100 UNIT/ML ~~LOC~~ SOLN: 0.0000 [IU] | Freq: Every day | SUBCUTANEOUS | Status: DC

## 2019-06-03 MED ORDER — INSULIN DETEMIR 100 UNIT/ML ~~LOC~~ SOLN
36.0000 [IU] | Freq: Every day | SUBCUTANEOUS | Status: DC
  Administered 2019-06-03 – 2019-06-04 (×2): 36 [IU] via SUBCUTANEOUS
  Filled 2019-06-03 (×2): qty 0.36

## 2019-06-03 NOTE — Progress Notes (Addendum)
Pt ambulated with pulse ox to RM,102-stopped due to "I'm tired" and needed to rest. Pulse ox 95% RA, HR 73 at rest. Pulse ox dropped for a second to 88% with cough and returned back to 92% RA upon return to room w HR 87. Noted more DOE/SOB with mild audible wheeze with exertion; resolves with rest.

## 2019-06-03 NOTE — Progress Notes (Signed)
Progress Note  Patient Name: Julie Bender Date of Encounter: 06/03/2019  Primary Cardiologist: Ida Rogue, MD   Subjective   Coughed all night long; getting nebulizer on my arrival. Felt well during nebs but started coughing once finished. Feels like there is phlegm that she cannot bring up. No chest pain. We reviewed her diuresis and cardiac plan, see below, all questions answered.  Inpatient Medications    Scheduled Meds: . aspirin EC  81 mg Oral Daily  . atorvastatin  40 mg Oral q1800  . citalopram  20 mg Oral Daily  . enoxaparin (LOVENOX) injection  40 mg Subcutaneous Q24H  . furosemide  40 mg Intravenous BID  . insulin aspart  0-15 Units Subcutaneous TID WC  . insulin aspart  0-5 Units Subcutaneous QHS  . insulin detemir  36 Units Subcutaneous Daily  . ipratropium-albuterol  3 mL Inhalation QID  . methylPREDNISolone (SOLU-MEDROL) injection  60 mg Intravenous Q8H  . metoprolol tartrate  12.5 mg Oral BID  . pantoprazole  40 mg Oral BID AC  . polyethylene glycol  17 g Oral Daily  . sodium chloride flush  3 mL Intravenous Q12H  . zolpidem  5 mg Oral QHS   Continuous Infusions:  PRN Meds: acetaminophen **OR** acetaminophen, albuterol, bisacodyl, chlorpheniramine-HYDROcodone, guaiFENesin-dextromethorphan, ondansetron **OR** ondansetron (ZOFRAN) IV, sodium chloride flush, sodium phosphate   Vital Signs    Vitals:   06/02/19 2104 06/03/19 0500 06/03/19 0554 06/03/19 0920  BP: 128/63  138/69 (!) 150/76  Pulse: (!) 57  (!) 55 (!) 59  Resp: 18  16 (!) 22  Temp: 97.8 F (36.6 C)  (!) 97.4 F (36.3 C) 97.7 F (36.5 C)  TempSrc: Oral  Oral Oral  SpO2: 97%  93% 97%  Weight:  98.6 kg    Height:        Intake/Output Summary (Last 24 hours) at 06/03/2019 1112 Last data filed at 06/03/2019 1033 Gross per 24 hour  Intake 74.63 ml  Output 2280 ml  Net -2205.37 ml   Last 3 Weights 06/03/2019 06/02/2019 06/02/2019  Weight (lbs) 217 lb 6 oz 216 lb 11.4 oz 216 lb 7.9  oz  Weight (kg) 98.6 kg 98.3 kg 98.2 kg      Telemetry    SR with intermittent a pacing, one run 3 beats NSVT - Personally Reviewed  ECG    No new since 10/16 - Personally Reviewed  Physical Exam   GEN: No acute distress. Satting well on room air. Neck: JVD low neck at 60 degrees Cardiac: Distant heart sounds. RRR, no rubs or gallops. 2/6 SM. Coughs with attempted valsalva Respiratory: Distant breath sounds with audible expiratory wheeze despite nebulizer therapy. No appreciable crackles GI: Soft, nontender, non-distended  MS: No edema; No deformity. Neuro:  Nonfocal  Psych: Normal affect   Labs    High Sensitivity Troponin:   Recent Labs  Lab 05/19/19 0835 05/19/19 1146 06/01/19 1814 06/01/19 1919  TROPONINIHS 90* 82* 56* 56*      Chemistry Recent Labs  Lab 06/01/19 1814 06/02/19 0449 06/03/19 0445  NA 139 139 134*  K 4.3 3.7 4.1  CL 106 103 102  CO2 27 25 23   GLUCOSE 109* 224* 251*  BUN 21* 20 20  CREATININE 1.16* 1.03* 0.97  CALCIUM 8.6* 8.6* 8.9  PROT 6.6  --   --   ALBUMIN 3.5  --   --   AST 15  --   --   ALT 11  --   --  ALKPHOS 66  --   --   BILITOT 0.5  --   --   GFRNONAA 51* 59* >60  GFRAA 59* >60 >60  ANIONGAP 6 11 9      Hematology Recent Labs  Lab 06/01/19 1814 06/02/19 0449  WBC 8.4 7.3  RBC 3.77* 3.82*  HGB 10.1* 10.2*  HCT 32.2* 32.5*  MCV 85.4 85.1  MCH 26.8 26.7  MCHC 31.4 31.4  RDW 15.3 15.4  PLT 320 301    BNP Recent Labs  Lab 06/01/19 1814  BNP 358.0*     DDimer No results for input(s): DDIMER in the last 168 hours.   Radiology    Dg Chest Portable 1 View  Result Date: 06/01/2019 CLINICAL DATA:  Cough, body aches and fatigue for 5 days EXAM: PORTABLE CHEST 1 VIEW COMPARISON:  Radiograph 05/25/2019, CT angio 05/19/2019 FINDINGS: Hazy interstitial opacities throughout the lungs with a basilar predominance are mild central venous congestion with cardiomegaly which is similar to comparison exam. Dual lead pacer  pack overlies the left chest wall with the lead at the cardiac apex and right atrium. No pneumothorax. No visible effusion. No acute osseous or soft tissue abnormality. IMPRESSION: 1. Unchanged bilateral diffuse interstitial opacities and central venous congestion likely reflecting some interstitial edema. 2. Stable cardiomegaly. Electronically Signed   By: Lovena Le M.D.   On: 06/01/2019 17:04    Cardiac Studies   Echo 05/21/19 1. Left ventricular ejection fraction, by visual estimation, is >75%. The left ventricle has hyperdynamic function. There is severely increased left ventricular hypertrophy. 2. Definity contrast agent was given IV to delineate the left ventricular endocardial borders. 3. Elevated mean left atrial pressure. 4. Hypertrophic cardiomyopathy. 5. Left ventricular diastolic Doppler parameters are consistent with pseudonormalization pattern of LV diastolic filling. 6. Global right ventricle was not well visualized.The right ventricular size is not well visualized. No increase in right ventricular wall thickness. 7. Left atrial size was mildly dilated. 8. Right atrial size was not well visualized. 9. The pericardium was not well visualized. 10. Mild aortic valve annular calcification. 11. The mitral valve is degenerative. Mild mitral valve regurgitation. 12. The tricuspid valve is not well visualized. Tricuspid valve regurgitation was not visualized by color flow Doppler. 13. The aortic valve was not well visualized Aortic valve regurgitation is trivial by color flow Doppler. Mild aortic valve sclerosis without stenosis. 14. The pulmonic valve was not well visualized. Pulmonic valve regurgitation is not visualized by color flow Doppler. 15. The inferior vena cava is normal in size with greater than 50% respiratory variability, suggesting right atrial pressure of 3 mmHg. 16. The interatrial septum was not well visualized.  Patient Profile     58 y.o. female with a  hx of HOCM, severe LVH, ICD placement, COPD, chronic diastolic heart failure who is being followed for the evaluation of shortness of breath at the request of Dr. Margaretmary Eddy  Assessment & Plan    Shortness of breath, cough, wheezing, orthopnea: -has diuresed but symptoms remain -now on steroids, cough suppressant, mucolytics, nebs -slightly elevated JVD, but at her baseline weight.  Appears to be nearly euvolemic -I suspect primarily pulmonary at this point, cardiac recs below -avoid overdiuresis with HOCM/severe LVH  Acute on chronic diastolic heart failure, severe LVH, HOCM -on my personal review of prior echo, there was not much of an LVOT gradient (around 7 mmHg). I did not see one with Valsalva. On her prior, LVOT gradient also only 13 mmHg -without significant gradient, she may  not get much benefit from disopyramide. Once she is able to Valsalva without cough, could consider limited echo for gradients +/- Valsalva -BP improving today, can add back home verapamil to her metoprolol -cautious of overdiuresis, appears to be nearing euvolemia. Would change her to oral diuretics (home dose) and monitor her output  For questions or updates, please contact [place] Please consult [email or web address] for contact info under    Signed, Buford Dresser, MD  06/03/2019, 11:12 AM

## 2019-06-03 NOTE — Progress Notes (Addendum)
Insulin therapy being adjusted. Solumedrol and tussionex started with decreased wheezing and cough noted. Infrequent dry cough. Tylenol x 1 prn for intermittent HA with cough/generalized aches effective. Miralax with warm prune juice given with stool as of yet. IV magnesium supplement given as ordered.

## 2019-06-03 NOTE — Progress Notes (Signed)
[facility] [place] NAME: Julie Bender    MR#:  [id number]  DATE OF BIRTH:  Dec 02, 1960  SUBJECTIVE:  CHIEF COMPLAINT: Patient is diffusely wheezing, could not sleep last night, dry cough, constipated for more than 3 days  REVIEW OF SYSTEMS:  CONSTITUTIONAL: No fever, fatigue or weakness.  EYES: No blurred or double vision.  EARS, NOSE, AND THROAT: No tinnitus or ear pain.  RESPIRATORY: No cough, reports shortness of breath with minimal exertion, dry cough, denies wheezing or hemoptysis.  CARDIOVASCULAR: No chest pain, orthopnea, edema.  GASTROINTESTINAL: No nausea, vomiting, diarrhea or abdominal pain.  GENITOURINARY: No dysuria, hematuria.  ENDOCRINE: No polyuria, nocturia,  HEMATOLOGY: No anemia, easy bruising or bleeding SKIN: No rash or lesion. MUSCULOSKELETAL: No joint pain or arthritis.   NEUROLOGIC: No tingling, numbness, weakness.  PSYCHIATRY: No anxiety or depression.   DRUG ALLERGIES:  No Known Allergies  VITALS:  Blood pressure (!) 150/76, pulse (!) 59, temperature 97.7 F (36.5 C), temperature source Oral, resp. rate (!) 22, height 5\' 4"  (1.626 m), weight 98.6 kg, SpO2 97 %.  PHYSICAL EXAMINATION:  GENERAL:  58 y.o.-year-old patient lying in the bed with no acute distress.  EYES: Pupils equal, round, reactive to light and accommodation. No scleral icterus. Extraocular muscles intact.  HEENT: Head atraumatic, normocephalic. Oropharynx and nasopharynx clear.  NECK:  Supple, no jugular venous distention. No thyroid enlargement, no tenderness.  LUNGS: Diminished breath sounds bilaterally with diffuse wheezing, no rales,rhonchi or crepitation. No use of accessory muscles of respiration.  CARDIOVASCULAR: S1, S2 normal. No murmurs, rubs, or gallops.  ABDOMEN: Soft, nontender, nondistended. Bowel sounds present.  Abdominal swelling EXTREMITIES: No pedal edema, cyanosis, or clubbing.  NEUROLOGIC: Cranial nerves II through  XII are intact. Muscle strength 5/5 in all extremities. Sensation intact. Gait not checked.  PSYCHIATRIC: The patient is alert and oriented x 3.  SKIN: No obvious rash, lesion, or ulcer.    LABORATORY PANEL:   CBC Recent Labs  Lab 06/02/19 0449  WBC 7.3  HGB 10.2*  HCT 32.5*  PLT 301   ------------------------------------------------------------------------------------------------------------------  Chemistries  Recent Labs  Lab 06/01/19 1814  06/03/19 0445  NA 139   < > 134*  K 4.3   < > 4.1  CL 106   < > 102  CO2 27   < > 23  GLUCOSE 109*   < > 251*  BUN 21*   < > 20  CREATININE 1.16*   < > 0.97  CALCIUM 8.6*   < > 8.9  AST 15  --   --   ALT 11  --   --   ALKPHOS 66  --   --   BILITOT 0.5  --   --    < > = values in this interval not displayed.   ------------------------------------------------------------------------------------------------------------------  Cardiac Enzymes No results for input(s): TROPONINI in the last 168 hours. ------------------------------------------------------------------------------------------------------------------  RADIOLOGY:  Dg Chest Portable 1 View  Result Date: 06/01/2019 CLINICAL DATA:  Cough, body aches and fatigue for 5 days EXAM: PORTABLE CHEST 1 VIEW COMPARISON:  Radiograph 05/25/2019, CT angio 05/19/2019 FINDINGS: Hazy interstitial opacities throughout the lungs with a basilar predominance are mild central venous congestion with cardiomegaly which is similar to comparison exam. Dual lead pacer pack overlies the left chest wall with the lead at the cardiac apex and right atrium. No pneumothorax. No visible effusion. No acute osseous or soft tissue abnormality. IMPRESSION: 1. Unchanged bilateral diffuse interstitial  opacities and central venous congestion likely reflecting some interstitial edema. 2. Stable cardiomegaly. Electronically Signed   By: Lovena Le M.D.   On: 06/01/2019 17:04    EKG:   Orders placed or  performed during the hospital encounter of 06/01/19  . EKG 12-Lead  . EKG 12-Lead    ASSESSMENT AND PLAN:   #Acute respiratory distress secondary to acute CHF with hypertensive heart disease, reactive airway disease exacerbation, hypertrophic obstructive cardiomyopathy On Lasix drip which is changed to Lasix 40 mg IV twice daily as patient does not clinically look significantly fluid overloaded and cardiology is not recommending over diuresing the patient  monitor daily weights, intake and output Consulted primary cardiology group-[facility], appreciate cardiology recommendations Metoprolol is added to the regimen If patient is clinically stable they will consider adding verapamil COVID-19 is negative  #Acute reactive airway disease-clinically not improving with p.o. steroids changed to IV Solu-Medrol and given 1 dose of magnesium 2 g IV , inhalers and supportive treatment as needed DuoNebs every 6 hours, albuterol as needed Antitussives added to the regimen  #Constipation Dulcolax suppository, MiraLAX  #Obstructive sleep apnea CPAP nightly  #Diabetes mellitus sliding scale insulin and monitor sugars closely while patient is on steroids  #Essential hypertension blood pressure being soft holding all home blood pressure medications  #Insomnia patient is requesting Ambien will provide the same     All the records are reviewed and case discussed with Care Management/Social Workerr. Management plans discussed with the patient, she is in agreement.  CODE STATUS: fc  TOTAL TIME TAKING CARE OF THIS PATIENT: 35 minutes.   POSSIBLE D/C IN 2 DAYS, DEPENDING ON CLINICAL CONDITION.  Note: This dictation was prepared with Dragon dictation along with smaller phrase technology. Any transcriptional errors that result from this process are unintentional.   Nicholes Mango M.D on 06/03/2019 at 2:47 PM  Between 7am to 6pm - Pager - [phone] After 6pm go to [email or web address] - password [email or web address]  [facility]  [facility]  Office  [phone]  CC: Primary care physician; Physicians, [place]

## 2019-06-03 NOTE — Progress Notes (Signed)
Pt declined cpap

## 2019-06-04 ENCOUNTER — Inpatient Hospital Stay: Payer: Medicare Other

## 2019-06-04 DIAGNOSIS — I421 Obstructive hypertrophic cardiomyopathy: Secondary | ICD-10-CM | POA: Diagnosis not present

## 2019-06-04 DIAGNOSIS — I110 Hypertensive heart disease with heart failure: Secondary | ICD-10-CM | POA: Diagnosis not present

## 2019-06-04 LAB — RESPIRATORY PANEL BY PCR

## 2019-06-04 LAB — BASIC METABOLIC PANEL
Anion gap: 8 (ref 5–15)
BUN: 19 mg/dL (ref 6–20)
CO2: 25 mmol/L (ref 22–32)
Calcium: 9.2 mg/dL (ref 8.9–10.3)
Chloride: 102 mmol/L (ref 98–111)
Creatinine, Ser: 0.88 mg/dL (ref 0.44–1.00)
GFR calc Af Amer: >60 mL/min (ref >60)
GFR calc non Af Amer: >60 mL/min (ref >60)
Glucose, Bld: 235 mg/dL — ABNORMAL HIGH (ref 70–99)
Potassium: 4.5 mmol/L (ref 3.5–5.1)
Sodium: 135 mmol/L (ref 135–145)

## 2019-06-04 LAB — GLUCOSE, CAPILLARY
Glucose-Capillary: 292 mg/dL — ABNORMAL HIGH (ref 70–99)
Glucose-Capillary: 216 mg/dL — ABNORMAL HIGH (ref 70–99)
Glucose-Capillary: 331 mg/dL — ABNORMAL HIGH (ref 70–99)
Glucose-Capillary: 305 mg/dL — ABNORMAL HIGH (ref 70–99)

## 2019-06-04 LAB — SARS CORONAVIRUS 2 (TAT 6-24 HRS): SARS Coronavirus 2: NEGATIVE

## 2019-06-04 MED ORDER — BENZONATATE 100 MG PO CAPS
200.0000 mg | ORAL_CAPSULE | Freq: Three times a day (TID) | ORAL | Status: DC
  Administered 2019-06-04 – 2019-06-09 (×16): 200 mg via ORAL
  Filled 2019-06-04 (×16): qty 2

## 2019-06-04 MED ORDER — METHYLPREDNISOLONE SODIUM SUCC 40 MG IJ SOLR
40.0000 mg | Freq: Three times a day (TID) | INTRAMUSCULAR | Status: DC
  Administered 2019-06-05 (×2): 40 mg via INTRAVENOUS
  Filled 2019-06-04 (×2): qty 1

## 2019-06-04 MED ORDER — HYDROCOD POLST-CPM POLST ER 10-8 MG/5ML PO SUER: 5.0000 mL | Freq: Two times a day (BID) | ORAL | Status: DC

## 2019-06-04 MED ORDER — IPRATROPIUM-ALBUTEROL 0.5-2.5 (3) MG/3ML IN SOLN
3.0000 mL | RESPIRATORY_TRACT | Status: DC
  Administered 2019-06-04 – 2019-06-06 (×12): 3 mL via RESPIRATORY_TRACT
  Filled 2019-06-04 (×13): qty 3

## 2019-06-04 MED ORDER — INSULIN DETEMIR 100 UNIT/ML ~~LOC~~ SOLN
40.0000 [IU] | Freq: Every day | SUBCUTANEOUS | Status: DC
  Administered 2019-06-05 – 2019-06-06 (×2): 40 [IU] via SUBCUTANEOUS
  Filled 2019-06-04 (×3): qty 0.4

## 2019-06-04 MED ORDER — INSULIN ASPART 100 UNIT/ML ~~LOC~~ SOLN
4.0000 [IU] | Freq: Three times a day (TID) | SUBCUTANEOUS | Status: DC
  Administered 2019-06-04 – 2019-06-05 (×2): 4 [IU] via SUBCUTANEOUS
  Filled 2019-06-04 (×2): qty 1

## 2019-06-04 MED ORDER — FUROSEMIDE 40 MG PO TABS
40.0000 mg | ORAL_TABLET | Freq: Two times a day (BID) | ORAL | Status: DC
  Administered 2019-06-04 – 2019-06-05 (×2): 40 mg via ORAL
  Filled 2019-06-04 (×2): qty 1

## 2019-06-04 MED ORDER — INSULIN DETEMIR 100 UNIT/ML ~~LOC~~ SOLN
40.0000 [IU] | Freq: Every day | SUBCUTANEOUS | Status: DC
  Filled 2019-06-04: qty 0.4

## 2019-06-04 MED ORDER — HYDROCOD POLST-CPM POLST ER 10-8 MG/5ML PO SUER
5.0000 mL | Freq: Four times a day (QID) | ORAL | Status: DC
  Administered 2019-06-04 – 2019-06-09 (×19): 5 mL via ORAL
  Filled 2019-06-04 (×19): qty 5

## 2019-06-04 MED ORDER — ALBUTEROL SULFATE (2.5 MG/3ML) 0.083% IN NEBU: 2.5000 mg | INHALATION_SOLUTION | RESPIRATORY_TRACT | Status: DC | PRN

## 2019-06-04 MED ORDER — INSULIN DETEMIR 100 UNIT/ML ~~LOC~~ SOLN
5.0000 [IU] | Freq: Once | SUBCUTANEOUS | Status: AC
  Administered 2019-06-04: 10:00:00 5 [IU] via SUBCUTANEOUS
  Filled 2019-06-04: qty 0.05

## 2019-06-04 NOTE — Consult Note (Signed)
Pulmonary Medicine          Date: 06/04/2019,   MRN# [id number] Julie Bender 09-11-1960     AdmissionWeight: 96.2 kg                 CurrentWeight: 98.6 kg      CHIEF COMPLAINT:   Severe shortness of breath   HISTORY OF PRESENT ILLNESS   This is a pleasant 58 year old female with a history of CHF, COPD, diabetes, diabetic retinopathy, history of a cardiac defibrillator, essential hypertension hypertrophic cardiomyopathy, who came into the hospital for shortness of breath and chest discomfort lasting approximately 7 days progressively worsening.  She reports abdominal swelling and edema. She was admitted for similar presentation early first week of October 2020 and was wheezing.  She had initiated pulmicort and upgraded her diuresis with net 20L diuresed prior to abominal swelling and SOB improved.  She had a COVID test which was negative.  She had a transthoracic echo done on last admission which confirmed presence of hypertrophic cardiomyopathy with severe LVH.  Her CBC and BMP are essentially unremarkable except for mild hyperglycemia and chronic anemia. BNP is elevated but slightly better then previous admission 450 >360. She denies fevers, but did have LRTI type symptoms including cough.      PAST MEDICAL HISTORY   Past Medical History:  Diagnosis Date   CHF (congestive heart failure) (HCC)    COPD (chronic obstructive pulmonary disease) (HCC)    Diabetes mellitus without complication (HCC)    Diabetic retinopathy ([place])    History of placement of internal cardiac defibrillator    Hypertension    Hypertrophic cardiomyopathy ([place])      SURGICAL HISTORY   Past Surgical History:  Procedure Laterality Date   CARDIAC DEFIBRILLATOR PLACEMENT     CHOLECYSTECTOMY       FAMILY HISTORY   Family History  Problem Relation Age of Onset   Hypertrophic cardiomyopathy Sister      SOCIAL HISTORY   Social History   Tobacco Use   Smoking status:  Passive Smoke Exposure - Never Smoker   Smokeless tobacco: Never Used  Substance Use Topics   Alcohol use: No   Drug use: Never     MEDICATIONS    Home Medication:    Current Medication:  Current Facility-Administered Medications:    acetaminophen (TYLENOL) tablet 650 mg, 650 mg, Oral, Q6H PRN, 650 mg at 06/03/19 1938 **OR** acetaminophen (TYLENOL) suppository 650 mg, 650 mg, Rectal, Q6H PRN, Lance Coon, MD   albuterol (PROVENTIL) (2.5 MG/3ML) 0.083% nebulizer solution 2.5 mg, 2.5 mg, Nebulization, Q2H PRN, Gouru, Aruna, MD   aspirin EC tablet 81 mg, 81 mg, Oral, Daily, Lance Coon, MD, 81 mg at 06/04/19 0836   atorvastatin (LIPITOR) tablet 40 mg, 40 mg, Oral, q1800, Lance Coon, MD, 40 mg at 06/03/19 1702   benzonatate (TESSALON) capsule 200 mg, 200 mg, Oral, TID, Gouru, Aruna, MD, 200 mg at 06/04/19 0948   bisacodyl (DULCOLAX) suppository 10 mg, 10 mg, Rectal, Daily PRN, Gouru, Aruna, MD, 10 mg at 06/03/19 1711   chlorpheniramine-HYDROcodone (TUSSIONEX) 10-8 MG/5ML suspension 5 mL, 5 mL, Oral, Q12H, Gouru, Aruna, MD   citalopram (CELEXA) tablet 20 mg, 20 mg, Oral, Daily, Lance Coon, MD, 20 mg at 06/04/19 0836   enoxaparin (LOVENOX) injection 40 mg, 40 mg, Subcutaneous, Q24H, Lance Coon, MD, 40 mg at 06/04/19 0514   furosemide (LASIX) tablet 40 mg, 40 mg, Oral, BID, Arida, Mertie Clause, MD   guaiFENesin-dextromethorphan (ROBITUSSIN  DM) 100-10 MG/5ML syrup 10 mL, 10 mL, Oral, Q4H PRN, Gouru, Aruna, MD, 10 mL at 06/04/19 1225   insulin aspart (novoLOG) injection 0-15 Units, 0-15 Units, Subcutaneous, TID AC & HS, Lance Coon, MD, 5 Units at 06/04/19 1226   insulin aspart (novoLOG) injection 4 Units, 4 Units, Subcutaneous, TID WC, Gouru, Aruna, MD   [START ON 06/05/2019] insulin detemir (LEVEMIR) injection 40 Units, 40 Units, Subcutaneous, Daily, Gouru, Aruna, MD   ipratropium-albuterol (DUONEB) 0.5-2.5 (3) MG/3ML nebulizer solution 3 mL, 3 mL, Inhalation,  Q4H, Gouru, Aruna, MD, 3 mL at 06/04/19 1640   methylPREDNISolone sodium succinate (SOLU-MEDROL) 125 mg/2 mL injection 60 mg, 60 mg, Intravenous, Q8H, Gouru, Aruna, MD, 60 mg at 06/04/19 0839   metoprolol tartrate (LOPRESSOR) tablet 12.5 mg, 12.5 mg, Oral, BID, Buford Dresser, MD, 12.5 mg at 06/04/19 0836   ondansetron (ZOFRAN) tablet 4 mg, 4 mg, Oral, Q6H PRN **OR** ondansetron (ZOFRAN) injection 4 mg, 4 mg, Intravenous, Q6H PRN, Lance Coon, MD   pantoprazole (PROTONIX) EC tablet 40 mg, 40 mg, Oral, BID AC, Lance Coon, MD, 40 mg at 06/04/19 0837   polyethylene glycol (MIRALAX / GLYCOLAX) packet 17 g, 17 g, Oral, Daily, Gouru, Aruna, MD, 17 g at 06/04/19 0841   sodium chloride flush (NS) 0.9 % injection 3 mL, 3 mL, Intravenous, Q12H, Gouru, Aruna, MD, 3 mL at 06/04/19 0842   sodium chloride flush (NS) 0.9 % injection 3 mL, 3 mL, Intravenous, PRN, Gouru, Aruna, MD, 3 mL at 06/03/19 1705   sodium phosphate (FLEET) 7-19 GM/118ML enema 1 enema, 1 enema, Rectal, Daily PRN, Gouru, Aruna, MD   zolpidem (AMBIEN) tablet 5 mg, 5 mg, Oral, QHS, Gouru, Aruna, MD, 5 mg at 06/03/19 2307    ALLERGIES   Patient has no known allergies.     REVIEW OF SYSTEMS    Review of Systems:  Gen:  Denies  fever, sweats, chills weigh loss  HEENT: Denies blurred vision, double vision, ear pain, eye pain, hearing loss, nose bleeds, sore throat Cardiac:  No dizziness, chest pain or heaviness, chest tightness,edema Resp:   Denies cough or sputum porduction, shortness of breath,wheezing, hemoptysis,  Gi: Denies swallowing difficulty, stomach pain, nausea or vomiting, diarrhea, constipation, bowel incontinence Gu:  Denies bladder incontinence, burning urine Ext:   Denies Joint pain, stiffness or swelling Skin: Denies  skin rash, easy bruising or bleeding or hives Endoc:  Denies polyuria, polydipsia , polyphagia or weight change Psych:   Denies depression, insomnia or hallucinations   Other:   All other systems negative   VS: BP (!) 159/71 (BP Location: Left Arm)    Pulse (!) 59    Temp (!) 97.5 F (36.4 C) (Oral)    Resp 17    Ht 5\' 4"  (1.626 m)    Wt 98.6 kg    SpO2 98%    BMI 37.31 kg/m      PHYSICAL EXAM    GENERAL:NAD, no fevers, chills, no weakness no fatigue HEAD: Normocephalic, atraumatic.  EYES: Pupils equal, round, reactive to light. Extraocular muscles intact. No scleral icterus.  MOUTH: Moist mucosal membrane. Dentition intact. No abscess noted.  EAR, NOSE, THROAT: Clear without exudates. No external lesions.  NECK: Supple. No thyromegaly. No nodules. No JVD.  PULMONARY: mild rhonchi with no crackles CARDIOVASCULAR: S1 and S2. Regular rate and rhythm. No murmurs, rubs, or gallops. No edema. Pedal pulses 2+ bilaterally.  GASTROINTESTINAL: Soft, nontender, nondistended. No masses. Positive bowel sounds. No hepatosplenomegaly.  MUSCULOSKELETAL: No swelling, clubbing,  or edema. Range of motion full in all extremities.  NEUROLOGIC: Cranial nerves II through XII are intact. No gross focal neurological deficits. Sensation intact. Reflexes intact.  SKIN: No ulceration, lesions, rashes, or cyanosis. Skin warm and dry. Turgor intact.  PSYCHIATRIC: Mood, affect within normal limits. The patient is awake, alert and oriented x 3. Insight, judgment intact.       IMAGING    Dg Chest 2 View  Result Date: 05/19/2019 CLINICAL DATA:  Bilateral lower extremity pain. Shortness of breath. EXAM: CHEST - 2 VIEW COMPARISON:  02/23/2019 FINDINGS: There is mild bilateral interstitial thickening. There is no focal consolidation. There is no pleural effusion or pneumothorax. There is stable cardiomegaly. There is a dual lead cardiac pacemaker. There is no acute osseous abnormality. IMPRESSION: Cardiomegaly with mild pulmonary vascular congestion. Electronically Signed   By: Kathreen Devoid   On: 05/19/2019 09:22   Ct Chest Wo Contrast  Result Date: 06/04/2019 CLINICAL DATA:  Cough and  shortness of breath. EXAM: CT CHEST WITHOUT CONTRAST TECHNIQUE: Multidetector CT imaging of the chest was performed following the standard protocol without IV contrast. COMPARISON:  05/19/2019 FINDINGS: Cardiovascular: Intracardiac AICD leads are unchanged. The heart remains mildly enlarged. Interval small pericardial effusion with a maximum thickness of 7 mm. The main pulmonary artery remains dilated with a transverse diameter of 3.8 cm. Mediastinum/Nodes: Diffusely enlarged and minimally heterogeneous thyroid gland with a curvilinear calcification in the left lobe. No enlarged lymph nodes. Lungs/Pleura: Multiple small bilateral calcified granulomata are again demonstrated. No pleural fluid. Mild linear atelectasis/scarring in the lingula with improvement. Mild bilateral bullous changes. Upper Abdomen: Cholecystectomy clips. Musculoskeletal: Mild thoracic spine degenerative changes. IMPRESSION: 1. Interval small pericardial effusion with a maximum thickness of 7 mm. 2. Stable enlarged main pulmonary artery, possibly due to pulmonary arterial hypertension. 3. Stable mild cardiomegaly and mild changes of COPD. Emphysema (ICD10-J43.9). Electronically Signed   By: Claudie Revering M.D.   On: 06/04/2019 09:42   Ct Angio Chest Pe W And/or Wo Contrast  Result Date: 05/19/2019 CLINICAL DATA:  Bilateral lower extremity pain and shortness of breath. EXAM: CT ANGIOGRAPHY CHEST CT ABDOMEN AND PELVIS WITH CONTRAST TECHNIQUE: Multidetector CT imaging of the chest was performed using the standard protocol during bolus administration of intravenous contrast. Multiplanar CT image reconstructions and MIPs were obtained to evaluate the vascular anatomy. Multidetector CT imaging of the abdomen and pelvis was performed using the standard protocol during bolus administration of intravenous contrast. CONTRAST:  163mL OMNIPAQUE IOHEXOL 350 MG/ML SOLN COMPARISON:  CT dated 04/18/2018 FINDINGS: CTA CHEST FINDINGS Cardiovascular: Contrast  injection is sufficient to demonstrate satisfactory opacification of the pulmonary arteries to the segmental level. There is no pulmonary embolus. The main pulmonary artery is dilated measuring approximately 3.8 cm in diameter. There is no CT evidence of acute right heart strain. There are mild atherosclerotic changes of the visualized thoracic aorta. Heart size is enlarged. There is significant wall thickening of the left ventricle. 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 There is no significant pericardial effusion. Mediastinum/Nodes: --No mediastinal or hilar lymphadenopathy. --No axillary lymphadenopathy. --No supraclavicular lymphadenopathy. --a large left-sided thyroid nodule is again noted. --The esophagus is unremarkable Lungs/Pleura: The lung volumes are low. The trachea is unremarkable. There is a trace right-sided pleural effusion. There is no pneumothorax or focal infiltrate. There is a small amount of atelectasis in the lingula. Musculoskeletal: No chest wall abnormality. No acute or significant osseous findings. Review of the MIP images confirms the above findings. CT ABDOMEN  and PELVIS FINDINGS Hepatobiliary: The liver is normal. Status post cholecystectomy.There is no biliary ductal dilation. Pancreas: Normal contours without ductal dilatation. No peripancreatic fluid collection. Spleen: No splenic laceration or hematoma. Adrenals/Urinary Tract: --Adrenal glands: No adrenal hemorrhage. --Right kidney/ureter: No hydronephrosis or perinephric hematoma. --Left kidney/ureter: No hydronephrosis or perinephric hematoma. --Urinary bladder: Unremarkable. Stomach/Bowel: --Stomach/Duodenum: No hiatal hernia or other gastric abnormality. Normal duodenal course and caliber. --Small bowel: No dilatation or inflammation. --Colon: No focal abnormality. --Appendix: Normal. Vascular/Lymphatic: Atherosclerotic calcification is present within the non-aneurysmal abdominal aorta, without hemodynamically significant  stenosis. --No retroperitoneal lymphadenopathy. --No mesenteric lymphadenopathy. --No pelvic or inguinal lymphadenopathy. Reproductive: Unremarkable Other: There is a partially visualized 1.7 x 1.2 cm subcutaneous nodule in the left gluteal fold. The abdominal wall is normal. Musculoskeletal. No acute displaced fractures. Review of the MIP images confirms the above findings. IMPRESSION: 1. No acute thoracic, abdominal or pelvic injury. 2. Cardiomegaly with significant wall thickening of the left ventricle. 3. Trace right-sided pleural effusion. 4. Dilated main pulmonary artery which can be seen in patients with elevated pulmonary artery pressures. 5. Partially visualized 1.7 x 1.2 cm subcutaneous nodule in the left gluteal fold. Recommend correlation with physical examination. This could represent a sebaceous cyst. Aortic Atherosclerosis (ICD10-I70.0). Electronically Signed   By: Constance Holster M.D.   On: 05/19/2019 11:06   Ct Abdomen Pelvis W Contrast  Result Date: 05/19/2019 CLINICAL DATA:  Bilateral lower extremity pain and shortness of breath. EXAM: CT ANGIOGRAPHY CHEST CT ABDOMEN AND PELVIS WITH CONTRAST TECHNIQUE: Multidetector CT imaging of the chest was performed using the standard protocol during bolus administration of intravenous contrast. Multiplanar CT image reconstructions and MIPs were obtained to evaluate the vascular anatomy. Multidetector CT imaging of the abdomen and pelvis was performed using the standard protocol during bolus administration of intravenous contrast. CONTRAST:  184mL OMNIPAQUE IOHEXOL 350 MG/ML SOLN COMPARISON:  CT dated 04/18/2018 FINDINGS: CTA CHEST FINDINGS Cardiovascular: Contrast injection is sufficient to demonstrate satisfactory opacification of the pulmonary arteries to the segmental level. There is no pulmonary embolus. The main pulmonary artery is dilated measuring approximately 3.8 cm in diameter. There is no CT evidence of acute right heart strain. There are  mild atherosclerotic changes of the visualized thoracic aorta. Heart size is enlarged. There is significant wall thickening of the left ventricle. 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 There is no significant pericardial effusion. Mediastinum/Nodes: --No mediastinal or hilar lymphadenopathy. --No axillary lymphadenopathy. --No supraclavicular lymphadenopathy. --a large left-sided thyroid nodule is again noted. --The esophagus is unremarkable Lungs/Pleura: The lung volumes are low. The trachea is unremarkable. There is a trace right-sided pleural effusion. There is no pneumothorax or focal infiltrate. There is a small amount of atelectasis in the lingula. Musculoskeletal: No chest wall abnormality. No acute or significant osseous findings. Review of the MIP images confirms the above findings. CT ABDOMEN and PELVIS FINDINGS Hepatobiliary: The liver is normal. Status post cholecystectomy.There is no biliary ductal dilation. Pancreas: Normal contours without ductal dilatation. No peripancreatic fluid collection. Spleen: No splenic laceration or hematoma. Adrenals/Urinary Tract: --Adrenal glands: No adrenal hemorrhage. --Right kidney/ureter: No hydronephrosis or perinephric hematoma. --Left kidney/ureter: No hydronephrosis or perinephric hematoma. --Urinary bladder: Unremarkable. Stomach/Bowel: --Stomach/Duodenum: No hiatal hernia or other gastric abnormality. Normal duodenal course and caliber. --Small bowel: No dilatation or inflammation. --Colon: No focal abnormality. --Appendix: Normal. Vascular/Lymphatic: Atherosclerotic calcification is present within the non-aneurysmal abdominal aorta, without hemodynamically significant stenosis. --No retroperitoneal lymphadenopathy. --No mesenteric lymphadenopathy. --No pelvic or inguinal lymphadenopathy. Reproductive: Unremarkable Other:  There is a partially visualized 1.7 x 1.2 cm subcutaneous nodule in the left gluteal fold. The abdominal wall is normal. Musculoskeletal. No  acute displaced fractures. Review of the MIP images confirms the above findings. IMPRESSION: 1. No acute thoracic, abdominal or pelvic injury. 2. Cardiomegaly with significant wall thickening of the left ventricle. 3. Trace right-sided pleural effusion. 4. Dilated main pulmonary artery which can be seen in patients with elevated pulmonary artery pressures. 5. Partially visualized 1.7 x 1.2 cm subcutaneous nodule in the left gluteal fold. Recommend correlation with physical examination. This could represent a sebaceous cyst. Aortic Atherosclerosis (ICD10-I70.0). Electronically Signed   By: Constance Holster M.D.   On: 05/19/2019 11:06   Dg Chest Portable 1 View  Result Date: 06/01/2019 CLINICAL DATA:  Cough, body aches and fatigue for 5 days EXAM: PORTABLE CHEST 1 VIEW COMPARISON:  Radiograph 05/25/2019, CT angio 05/19/2019 FINDINGS: Hazy interstitial opacities throughout the lungs with a basilar predominance are mild central venous congestion with cardiomegaly which is similar to comparison exam. Dual lead pacer pack overlies the left chest wall with the lead at the cardiac apex and right atrium. No pneumothorax. No visible effusion. No acute osseous or soft tissue abnormality. IMPRESSION: 1. Unchanged bilateral diffuse interstitial opacities and central venous congestion likely reflecting some interstitial edema. 2. Stable cardiomegaly. Electronically Signed   By: Lovena Le M.D.   On: 06/01/2019 17:04   Dg Chest Port 1 View  Result Date: 05/25/2019 CLINICAL DATA:  Shortness of breath on exertion EXAM: PORTABLE CHEST 1 VIEW COMPARISON:  Chest radiograph dated 05/19/2019 and CT chest dated 05/19/2019. FINDINGS: A left subclavian approach cardiac device is redemonstrated. A 2.3 cm long radiopacity overlies the cardiac silhouette and may be external to the patient. The heart remains enlarged. Vascular calcifications are seen in the aortic arch. There is a mild diffuse bilateral interstitial opacities which  are not significantly changed. There is no pleural effusion or pneumothorax. IMPRESSION: 1. No significant interval change in the mild diffuse interstitial opacities. 2. A 2.3 cm long radiopacity overlies the cardiac silhouette and may be external to the patient. 3. Stable cardiomegaly. Electronically Signed   By: Zerita Boers M.D.   On: 05/25/2019 11:00             ASSESSMENT/PLAN    SOB and cough due to Rhinovirus infection  Complicated by hypertrophic cardiomyopathy and PAH - reviewed serial CT chest - diffuse GGO is improved from previous done 2 weeks ago  - she does feel relief from albuterol  - patient endorses some symptoms of anxiety -PH is likely pre and post capillary Group 2 and 1 - she is currently NYHA/WHO 3-4 - would recommend RHC for full evaluation and tx , can be done on outpatient    Obstructive sleep apnea diagnostic sleep study 10/02/2017>>  PLM index of 132, PLM arousal index of 13.7.  Mild sleep apnea with an AHI of 7.1.  CPAP titration study was recommended as well as work-up for periodic limb movement disorder. CPAP titration study 12/05/2017>> PLM index 65, PLM arousal index of 2.  Hypopnea index remained elevated above 10 and all pressures until a pressure of 10 was achieved and AHI was 0. CPAP of 10 was recommended.  -patient did not wear consistently.        Thank you for allowing me to participate in the care of this patient.    Patient/Family are satisfied with care plan and all questions have been answered.  This document was  prepared using Systems analyst and may include unintentional dictation errors.     Ottie Glazier, M.D.  Division of [place]

## 2019-06-04 NOTE — Progress Notes (Signed)
Inpatient Diabetes Program Recommendations  AACE/ADA: New Consensus Statement on Inpatient Glycemic Control   Target Ranges:  Prepandial:   less than 140 mg/dL      Peak postprandial:   less than 180 mg/dL (1-2 hours)      Critically ill patients:  140 - 180 mg/dL   Results for VIRA, BILSON (MRN [id number]) as of 06/04/2019 12:52  Ref. Range 06/03/2019 08:08 06/03/2019 12:02 06/03/2019 16:20 06/03/2019 21:49 06/04/2019 08:23 06/04/2019 12:16  Glucose-Capillary Latest Ref Range: 70 - 99 mg/dL 241 (H) 327 (H) 348 (H) 410 (H) 292 (H) 216 (H)   Review of Glycemic Control  Diabetes history: DM2 Outpatient Diabetes medications: Levemir 42 units QHS, Novolog 13 units TID with meals, Metformin 850 mg TID Current orders for Inpatient glycemic control: Levemir 40 units daily, Novolog 0-15 units AC&HS; Solumedrol 60 mg Q8H  Inpatient Diabetes Program Recommendations:   Insulin-Basal: Noted Levemir was increased to 40 units daily today.  Insulin-Meal Coverage: Please consider ordering Novolog 4 units TID with meals for meal coverage if patient eats at least 50% of meals.  Thanks, Barnie Alderman, RN, MSN, CDE Diabetes Coordinator Inpatient Diabetes Program [phone] (Team Pager from 8am to 5pm)

## 2019-06-04 NOTE — Progress Notes (Signed)
[facility] [place] NAME: Julie Bender    MR#:  [id number]  DATE OF BIRTH:  07-12-1961  SUBJECTIVE:  CHIEF COMPLAINT: Patient is persistently coughing, not feeling better  REVIEW OF SYSTEMS:  CONSTITUTIONAL: No fever, fatigue or weakness.  EYES: No blurred or double vision.  EARS, NOSE, AND THROAT: No tinnitus or ear pain.  RESPIRATORY: Frequent coughing spells , reports shortness of breath with minimal exertion,  denies  hemoptysis.  CARDIOVASCULAR: No chest pain, orthopnea, edema.  GASTROINTESTINAL: No nausea, vomiting, diarrhea or abdominal pain.  GENITOURINARY: No dysuria, hematuria.  ENDOCRINE: No polyuria, nocturia,  HEMATOLOGY: No anemia, easy bruising or bleeding SKIN: No rash or lesion. MUSCULOSKELETAL: No joint pain or arthritis.   NEUROLOGIC: No tingling, numbness, weakness.  PSYCHIATRY: No anxiety or depression.   DRUG ALLERGIES:  No Known Allergies  VITALS:  Blood pressure (!) 151/78, pulse 63, temperature 97.6 F (36.4 C), resp. rate 20, height 5\' 4"  (1.626 m), weight 98.6 kg, SpO2 93 %.  PHYSICAL EXAMINATION:  GENERAL:  58 y.o.-year-old patient lying in the bed with no acute distress.  EYES: Pupils equal, round, reactive to light and accommodation. No scleral icterus. Extraocular muscles intact.  HEENT: Head atraumatic, normocephalic. Oropharynx and nasopharynx clear.  NECK:  Supple, no jugular venous distention. No thyroid enlargement, no tenderness.  LUNGS: Diminished breath sounds bilaterally with min diffuse wheezing, no rales,rhonchi or crepitation. No use of accessory muscles of respiration.  CARDIOVASCULAR: S1, S2 normal. No murmurs, rubs, or gallops.  ABDOMEN: Soft, nontender, nondistended. Bowel sounds present.  Abdominal swelling EXTREMITIES: No pedal edema, cyanosis, or clubbing.  NEUROLOGIC: Cranial nerves II through XII are intact. Muscle strength 5/5 in all extremities. Sensation intact. Gait not  checked.  PSYCHIATRIC: The patient is alert and oriented x 3.  SKIN: No obvious rash, lesion, or ulcer.    LABORATORY PANEL:   CBC Recent Labs  Lab 06/02/19 0449  WBC 7.3  HGB 10.2*  HCT 32.5*  PLT 301   ------------------------------------------------------------------------------------------------------------------  Chemistries  Recent Labs  Lab 06/01/19 1814  06/04/19 0441  NA 139   < > 135  K 4.3   < > 4.5  CL 106   < > 102  CO2 27   < > 25  GLUCOSE 109*   < > 235*  BUN 21*   < > 19  CREATININE 1.16*   < > 0.88  CALCIUM 8.6*   < > 9.2  AST 15  --   --   ALT 11  --   --   ALKPHOS 66  --   --   BILITOT 0.5  --   --    < > = values in this interval not displayed.   ------------------------------------------------------------------------------------------------------------------  Cardiac Enzymes No results for input(s): TROPONINI in the last 168 hours. ------------------------------------------------------------------------------------------------------------------  RADIOLOGY:  Ct Chest Wo Contrast  Result Date: 06/04/2019 CLINICAL DATA:  Cough and shortness of breath. EXAM: CT CHEST WITHOUT CONTRAST TECHNIQUE: Multidetector CT imaging of the chest was performed following the standard protocol without IV contrast. COMPARISON:  05/19/2019 FINDINGS: Cardiovascular: Intracardiac AICD leads are unchanged. The heart remains mildly enlarged. Interval small pericardial effusion with a maximum thickness of 7 mm. The main pulmonary artery remains dilated with a transverse diameter of 3.8 cm. Mediastinum/Nodes: Diffusely enlarged and minimally heterogeneous thyroid gland with a curvilinear calcification in the left lobe. No enlarged lymph nodes. Lungs/Pleura: Multiple small bilateral calcified granulomata are again demonstrated. No pleural  fluid. Mild linear atelectasis/scarring in the lingula with improvement. Mild bilateral bullous changes. Upper Abdomen: Cholecystectomy clips.  Musculoskeletal: Mild thoracic spine degenerative changes. IMPRESSION: 1. Interval small pericardial effusion with a maximum thickness of 7 mm. 2. Stable enlarged main pulmonary artery, possibly due to pulmonary arterial hypertension. 3. Stable mild cardiomegaly and mild changes of COPD. Emphysema (ICD10-J43.9). Electronically Signed   By: Claudie Revering M.D.   On: 06/04/2019 09:42    EKG:   Orders placed or performed during the hospital encounter of 06/01/19  . EKG 12-Lead  . EKG 12-Lead    ASSESSMENT AND PLAN:   #Acute respiratory distress secondary to acute CHF with hypertensive heart disease, reactive airway disease exacerbation, hypertrophic obstructive cardiomyopathy On Lasix drip which is changed to Lasix 40 mg IV twice daily as patient does not clinically look significantly fluid overloaded and cardiology is not recommending over diuresing the patient  monitor daily weights, intake and output Consulted primary cardiology group-[facility], appreciate cardiology recommendations Metoprolol is added to the regimen If patient is clinically stable they will consider adding verapamil COVID-19 is negative, repeat Covid test and respiratory panel are ordered as the patient is having persistent cough  #Acute reactive airway disease-with persistent cough Continue IV Solu-Medrol 60 mg IV every 8 hours  given 1 dose of magnesium 2 g IV on 06/03/2019, inhalers and supportive treatment as needed DuoNebs every 6 hours, albuterol as needed Antitussives added to the regimen.  Patient is on Tussionex, Tessalon Perles scheduled and Robitussin as needed Pulmonology consult placed-Dr. Lanney Gins will see the patient Repeat Covid test ordered-rapid test  #Small pericardial effusion Small pericardial effusion noticed on the CT chest Discussed with  Dr. Fletcher Anon, he will follow up the patient and not suggesting echocardiogram as the effusion is very small  #Constipation Dulcolax suppository,  MiraLAX  #Obstructive sleep apnea CPAP nightly  #Diabetes mellitus sliding scale insulin and monitor sugars closely while patient is on steroids  #Essential hypertension blood pressure being soft holding all home blood pressure medications  #Insomnia patient is requesting Ambien will provide the same     All the records are reviewed and case discussed with Care Management/Social Workerr. Management plans discussed with the patient, she is in agreement.  CODE STATUS: fc  TOTAL TIME TAKING CARE OF THIS PATIENT: 35 minutes.   POSSIBLE D/C IN 2 DAYS, DEPENDING ON CLINICAL CONDITION.  Note: This dictation was prepared with Dragon dictation along with smaller phrase technology. Any transcriptional errors that result from this process are unintentional.   Nicholes Mango M.D on 06/04/2019 at 12:30 PM  Between 7am to 6pm - Pager - [phone] After 6pm go to [email or web address] - password [email or web address] [facility]  [facility]  Office  [phone]  CC: Primary care physician; Physicians, [place]

## 2019-06-04 NOTE — Progress Notes (Signed)
Progress Note  Patient Name: Julie Bender Date of Encounter: 06/04/2019  Primary Cardiologist: Ida Rogue, MD   Subjective   The patient had worsening shortness of breath and cough today and she is being ruled out for Covid again.  No chest pain.  Inpatient Medications    Scheduled Meds:  aspirin EC  81 mg Oral Daily   atorvastatin  40 mg Oral q1800   benzonatate  200 mg Oral TID   chlorpheniramine-HYDROcodone  5 mL Oral Q12H   citalopram  20 mg Oral Daily   enoxaparin (LOVENOX) injection  40 mg Subcutaneous Q24H   furosemide  40 mg Oral BID   insulin aspart  0-15 Units Subcutaneous TID AC & HS   insulin aspart  4 Units Subcutaneous TID WC   [START ON 06/05/2019] insulin detemir  40 Units Subcutaneous Daily   ipratropium-albuterol  3 mL Inhalation Q4H   methylPREDNISolone (SOLU-MEDROL) injection  60 mg Intravenous Q8H   metoprolol tartrate  12.5 mg Oral BID   pantoprazole  40 mg Oral BID AC   polyethylene glycol  17 g Oral Daily   sodium chloride flush  3 mL Intravenous Q12H   zolpidem  5 mg Oral QHS   Continuous Infusions:  PRN Meds: acetaminophen **OR** acetaminophen, albuterol, bisacodyl, guaiFENesin-dextromethorphan, ondansetron **OR** ondansetron (ZOFRAN) IV, sodium chloride flush, sodium phosphate   Vital Signs    Vitals:   06/04/19 0515 06/04/19 0733 06/04/19 0812 06/04/19 1206  BP: (!) 143/83 (!) 151/78    Pulse: 65 (!) 58 63   Resp: 18 20 20 20   Temp: 97.7 F (36.5 C) 97.6 F (36.4 C)    TempSrc: Oral     SpO2: 96% 93% 93%   Weight:      Height:        Intake/Output Summary (Last 24 hours) at 06/04/2019 1351 Last data filed at 06/04/2019 0517 Gross per 24 hour  Intake 240 ml  Output 1350 ml  Net -1110 ml   Last 3 Weights 06/03/2019 06/02/2019 06/02/2019  Weight (lbs) 217 lb 6 oz 216 lb 11.4 oz 216 lb 7.9 oz  Weight (kg) 98.6 kg 98.3 kg 98.2 kg      Telemetry    Normal sinus rhythm with intermittent wide-complex  tachycardia- Personally Reviewed  ECG    Not done today - Personally Reviewed  Physical Exam   GEN: No acute distress.   Neck: No JVD Cardiac: RRR, no murmurs, rubs, or gallops.  Respiratory:  Mild bilateral expiratory wheezing GI: Soft, nontender, non-distended  MS: No edema; No deformity. Neuro:  Nonfocal  Psych: Normal affect   Labs    High Sensitivity Troponin:   Recent Labs  Lab 05/19/19 0835 05/19/19 1146 06/01/19 1814 06/01/19 1919  TROPONINIHS 90* 82* 56* 56*      Chemistry Recent Labs  Lab 06/01/19 1814 06/02/19 0449 06/03/19 0445 06/04/19 0441  NA 139 139 134* 135  K 4.3 3.7 4.1 4.5  CL 106 103 102 102  CO2 27 25 23 25   GLUCOSE 109* 224* 251* 235*  BUN 21* 20 20 19   CREATININE 1.16* 1.03* 0.97 0.88  CALCIUM 8.6* 8.6* 8.9 9.2  PROT 6.6  --   --   --   ALBUMIN 3.5  --   --   --   AST 15  --   --   --   ALT 11  --   --   --   ALKPHOS 66  --   --   --  BILITOT 0.5  --   --   --   GFRNONAA 51* 59* >60 >60  GFRAA 59* >60 >60 >60  ANIONGAP 6 11 9 8      Hematology Recent Labs  Lab 06/01/19 1814 06/02/19 0449  WBC 8.4 7.3  RBC 3.77* 3.82*  HGB 10.1* 10.2*  HCT 32.2* 32.5*  MCV 85.4 85.1  MCH 26.8 26.7  MCHC 31.4 31.4  RDW 15.3 15.4  PLT 320 301    BNP Recent Labs  Lab 06/01/19 1814  BNP 358.0*     DDimer No results for input(s): DDIMER in the last 168 hours.   Radiology    Ct Chest Wo Contrast  Result Date: 06/04/2019 CLINICAL DATA:  Cough and shortness of breath. EXAM: CT CHEST WITHOUT CONTRAST TECHNIQUE: Multidetector CT imaging of the chest was performed following the standard protocol without IV contrast. COMPARISON:  05/19/2019 FINDINGS: Cardiovascular: Intracardiac AICD leads are unchanged. The heart remains mildly enlarged. Interval small pericardial effusion with a maximum thickness of 7 mm. The main pulmonary artery remains dilated with a transverse diameter of 3.8 cm. Mediastinum/Nodes: Diffusely enlarged and minimally  heterogeneous thyroid gland with a curvilinear calcification in the left lobe. No enlarged lymph nodes. Lungs/Pleura: Multiple small bilateral calcified granulomata are again demonstrated. No pleural fluid. Mild linear atelectasis/scarring in the lingula with improvement. Mild bilateral bullous changes. Upper Abdomen: Cholecystectomy clips. Musculoskeletal: Mild thoracic spine degenerative changes. IMPRESSION: 1. Interval small pericardial effusion with a maximum thickness of 7 mm. 2. Stable enlarged main pulmonary artery, possibly due to pulmonary arterial hypertension. 3. Stable mild cardiomegaly and mild changes of COPD. Emphysema (ICD10-J43.9). Electronically Signed   By: Claudie Revering M.D.   On: 06/04/2019 09:42    Cardiac Studies   Echo 05/21/19 1. Left ventricular ejection fraction, by visual estimation, is >75%. The left ventricle has hyperdynamic function. There is severely increased left ventricular hypertrophy. 2. Definity contrast agent was given IV to delineate the left ventricular endocardial borders. 3. Elevated mean left atrial pressure. 4. Hypertrophic cardiomyopathy. 5. Left ventricular diastolic Doppler parameters are consistent with pseudonormalization pattern of LV diastolic filling. 6. Global right ventricle was not well visualized.The right ventricular size is not well visualized. No increase in right ventricular wall thickness. 7. Left atrial size was mildly dilated. 8. Right atrial size was not well visualized. 9. The pericardium was not well visualized. 10. Mild aortic valve annular calcification. 11. The mitral valve is degenerative. Mild mitral valve regurgitation. 12. The tricuspid valve is not well visualized. Tricuspid valve regurgitation was not visualized by color flow Doppler. 13. The aortic valve was not well visualized Aortic valve regurgitation is trivial by color flow Doppler. Mild aortic valve sclerosis without stenosis. 14. The pulmonic valve was not  well visualized. Pulmonic valve regurgitation is not visualized by color flow Doppler. 15. The inferior vena cava is normal in size with greater than 50% respiratory variability, suggesting right atrial pressure of 3 mmHg. 16. The interatrial septum was not well visualized.  Patient Profile     58 y.o. female with a hx of HOCM, severe LVH, ICD placement, COPD, chronic diastolic heart failurewho is being followed for the evaluation of shortness of breath  Assessment & Plan    1.  Shortness of breath and cough: Her symptoms have persisted for few weeks now and there is a concern about possible COVID-19 in spite of negative testing.  She is being ruled out again.  There is still some expiratory wheezing by physical exam.  I do not think her symptoms are from a cardiac etiology.  Consider pulmonary consultation.  2.  Acute on chronic diastolic heart failure: The patient appears to be euvolemic and given her known history of hypertrophic cardiomyopathy, we have to avoid overdiuresis.  I elected to switch furosemide to oral.  3.  Hypertrophic cardiomyopathy: She was on verapamil as an outpatient but it appears that some of her medications were held in the hospital likely due to hypotension.  She is currently on small dose metoprolol.  Once blood pressure improves, consider resuming small dose verapamil.  4.  Small pericardial effusion: This was noted on CT scan of the lungs today.  It was small overall.  She did have a recent echo 2 weeks ago with no significant effusion.  I do not think a repeat echocardiogram is needed given small size.      For questions or updates, please contact [place] Please consult [email or web address] for contact info under        Signed, Kathlyn Sacramento, MD  06/04/2019, 1:51 PM

## 2019-06-05 DIAGNOSIS — I421 Obstructive hypertrophic cardiomyopathy: Secondary | ICD-10-CM | POA: Diagnosis not present

## 2019-06-05 DIAGNOSIS — I5033 Acute on chronic diastolic (congestive) heart failure: Secondary | ICD-10-CM | POA: Diagnosis not present

## 2019-06-05 LAB — BASIC METABOLIC PANEL
Anion gap: 7 (ref 5–15)
BUN: 34 mg/dL — ABNORMAL HIGH (ref 6–20)
CO2: 24 mmol/L (ref 22–32)
Calcium: 9.4 mg/dL (ref 8.9–10.3)
Chloride: 100 mmol/L (ref 98–111)
Creatinine, Ser: 1.27 mg/dL — ABNORMAL HIGH (ref 0.44–1.00)
GFR calc Af Amer: 53 mL/min — ABNORMAL LOW (ref >60)
GFR calc non Af Amer: 46 mL/min — ABNORMAL LOW (ref >60)
Glucose, Bld: 288 mg/dL — ABNORMAL HIGH (ref 70–99)
Potassium: 4.7 mmol/L (ref 3.5–5.1)
Sodium: 131 mmol/L — ABNORMAL LOW (ref 135–145)

## 2019-06-05 LAB — GLUCOSE, CAPILLARY
Glucose-Capillary: 285 mg/dL — ABNORMAL HIGH (ref 70–99)
Glucose-Capillary: 356 mg/dL — ABNORMAL HIGH (ref 70–99)
Glucose-Capillary: 415 mg/dL — ABNORMAL HIGH (ref 70–99)
Glucose-Capillary: 419 mg/dL — ABNORMAL HIGH (ref 70–99)
Glucose-Capillary: 366 mg/dL — ABNORMAL HIGH (ref 70–99)

## 2019-06-05 MED ORDER — INSULIN ASPART 100 UNIT/ML ~~LOC~~ SOLN
6.0000 [IU] | Freq: Three times a day (TID) | SUBCUTANEOUS | Status: DC
  Administered 2019-06-05 – 2019-06-06 (×4): 6 [IU] via SUBCUTANEOUS
  Filled 2019-06-05 (×4): qty 1

## 2019-06-05 MED ORDER — METOPROLOL TARTRATE 25 MG PO TABS
25.0000 mg | ORAL_TABLET | Freq: Two times a day (BID) | ORAL | Status: DC
  Administered 2019-06-05 – 2019-06-09 (×7): 25 mg via ORAL
  Filled 2019-06-05 (×8): qty 1

## 2019-06-05 MED ORDER — FUROSEMIDE 40 MG PO TABS
40.0000 mg | ORAL_TABLET | Freq: Every day | ORAL | Status: DC
  Administered 2019-06-06 – 2019-06-07 (×2): 40 mg via ORAL
  Filled 2019-06-05 (×2): qty 1

## 2019-06-05 MED ORDER — INSULIN ASPART 100 UNIT/ML ~~LOC~~ SOLN
20.0000 [IU] | Freq: Once | SUBCUTANEOUS | Status: AC
  Administered 2019-06-05: 18:00:00 20 [IU] via SUBCUTANEOUS
  Filled 2019-06-05: qty 1

## 2019-06-05 MED ORDER — MAGNESIUM SULFATE 2 GM/50ML IV SOLN
2.0000 g | Freq: Once | INTRAVENOUS | Status: DC
  Filled 2019-06-05: qty 50

## 2019-06-05 MED ORDER — DILTIAZEM HCL 30 MG PO TABS
30.0000 mg | ORAL_TABLET | Freq: Three times a day (TID) | ORAL | Status: DC
  Administered 2019-06-05 – 2019-06-06 (×4): 30 mg via ORAL
  Filled 2019-06-05 (×4): qty 1

## 2019-06-05 MED ORDER — HYDRALAZINE HCL 20 MG/ML IJ SOLN
10.0000 mg | INTRAMUSCULAR | Status: DC | PRN
  Administered 2019-06-05: 10 mg via INTRAVENOUS
  Filled 2019-06-05: qty 1

## 2019-06-05 MED ORDER — METHYLPREDNISOLONE SODIUM SUCC 40 MG IJ SOLR
40.0000 mg | Freq: Two times a day (BID) | INTRAMUSCULAR | Status: DC
  Administered 2019-06-05 – 2019-06-06 (×2): 40 mg via INTRAVENOUS
  Filled 2019-06-05 (×2): qty 1

## 2019-06-05 MED ORDER — ALPRAZOLAM 0.5 MG PO TABS
0.5000 mg | ORAL_TABLET | Freq: Three times a day (TID) | ORAL | Status: DC | PRN
  Administered 2019-06-05 – 2019-06-06 (×3): 0.5 mg via ORAL
  Filled 2019-06-05 (×3): qty 1

## 2019-06-05 MED ORDER — INSULIN DETEMIR 100 UNIT/ML ~~LOC~~ SOLN
7.0000 [IU] | Freq: Once | SUBCUTANEOUS | Status: AC
  Administered 2019-06-05: 21:00:00 7 [IU] via SUBCUTANEOUS
  Filled 2019-06-05: qty 0.07

## 2019-06-05 MED ORDER — OXYCODONE HCL 5 MG PO TABS
5.0000 mg | ORAL_TABLET | Freq: Four times a day (QID) | ORAL | Status: DC | PRN
  Administered 2019-06-05 – 2019-06-09 (×5): 5 mg via ORAL
  Filled 2019-06-05 (×5): qty 1

## 2019-06-05 NOTE — Progress Notes (Signed)
Inpatient Diabetes Program Recommendations  AACE/ADA: New Consensus Statement on Inpatient Glycemic Control   Target Ranges:  Prepandial:   less than 140 mg/dL      Peak postprandial:   less than 180 mg/dL (1-2 hours)      Critically ill patients:  140 - 180 mg/dL   Results for Julie Bender, Julie Bender (MRN [id number]) as of 06/05/2019 10:37  Ref. Range 06/04/2019 08:23 06/04/2019 12:16 06/04/2019 16:54 06/04/2019 21:29 06/05/2019 07:41  Glucose-Capillary Latest Ref Range: 70 - 99 mg/dL 292 (H) 216 (H) 331 (H) 305 (H) 285 (H)    Review of Glycemic Control  Diabetes history: DM2 Outpatient Diabetes medications: Levemir 42 units QHS, Novolog 13 units TID with meals, Metformin 850 mg TID Current orders for Inpatient glycemic control: Levemir 40 units daily, Novolog 0-15 units AC&HS, Novolog 4 units TID with meals for meal coverage; Solumedrol 40 mg Q8H  Inpatient Diabetes Program Recommendations:   Insulin-Basal: If steroids are continued, please consider increasing Levemir to 45 units daily. If agreeable, please also order one time Levemir 5 units x1 now for total of 45 units today.  Insulin-Meal Coverage: If steroids are continued, please consider increasing meal coverage to Novolog 8 units TID with meals if patient eats at least 50% of meals.  Thanks, Barnie Alderman, RN, MSN, CDE Diabetes Coordinator Inpatient Diabetes Program [phone] (Team Pager from 8am to 5pm)

## 2019-06-05 NOTE — Progress Notes (Signed)
Progress Note  Patient Name: Julie Bender Date of Encounter: 06/05/2019  Primary Cardiologist: Ida Rogue, MD   Subjective   Patient still feels very short of breath.  She also notes cough that preceded this hospitalization by few days.  No chest pain or palpitations.  Inpatient Medications    Scheduled Meds: . aspirin EC  81 mg Oral Daily  . atorvastatin  40 mg Oral q1800  . benzonatate  200 mg Oral TID  . chlorpheniramine-HYDROcodone  5 mL Oral Q6H  . citalopram  20 mg Oral Daily  . enoxaparin (LOVENOX) injection  40 mg Subcutaneous Q24H  . furosemide  40 mg Oral BID  . insulin aspart  0-15 Units Subcutaneous TID AC & HS  . insulin aspart  6 Units Subcutaneous TID WC  . insulin detemir  40 Units Subcutaneous Daily  . ipratropium-albuterol  3 mL Inhalation Q4H  . methylPREDNISolone (SOLU-MEDROL) injection  40 mg Intravenous Q12H  . metoprolol tartrate  12.5 mg Oral BID  . pantoprazole  40 mg Oral BID AC  . polyethylene glycol  17 g Oral Daily  . sodium chloride flush  3 mL Intravenous Q12H  . zolpidem  5 mg Oral QHS   Continuous Infusions: . magnesium sulfate bolus IVPB Stopped (06/05/19 1041)   PRN Meds: acetaminophen **OR** acetaminophen, albuterol, ALPRAZolam, bisacodyl, guaiFENesin-dextromethorphan, ondansetron **OR** ondansetron (ZOFRAN) IV, sodium chloride flush, sodium phosphate   Vital Signs    Vitals:   06/05/19 0402 06/05/19 0826 06/05/19 0832 06/05/19 1131  BP: (!) 159/95 (!) 176/103    Pulse: (!) 55 (!) 55 (!) 56 65  Resp: 17 20 18 20   Temp: 97.7 F (36.5 C) (!) 97.5 F (36.4 C)    TempSrc: Oral Oral    SpO2: 92% 96% 95% 96%  Weight:      Height:        Intake/Output Summary (Last 24 hours) at 06/05/2019 1318 Last data filed at 06/05/2019 0900 Gross per 24 hour  Intake 120 ml  Output 800 ml  Net -680 ml   Last 3 Weights 06/03/2019 06/02/2019 06/02/2019  Weight (lbs) 217 lb 6 oz 216 lb 11.4 oz 216 lb 7.9 oz  Weight (kg) 98.6 kg 98.3  kg 98.2 kg      Telemetry    Sinus rhythm and atrially paced rhythm.  Occasional PACs and brief atrial runs noted. - Personally Reviewed  ECG    No new tracing. - Personally Reviewed  Physical Exam   GEN:  Patient is comfortably lying in bed but appears visibly short of breath when sits up. Neck: No obvious JVD, though evaluation is limited by body habitus. Cardiac:  Regular rate and rhythm with 1/6 systolic murmur. Respiratory: Clear to auscultation bilaterally. GI:  Obese with mild diffuse tenderness.  Unable to assess HSM due to body habitus. MS: No edema; No deformity. Neuro:  Nonfocal  Psych: Patient appears anxious.  Labs    High Sensitivity Troponin:   Recent Labs  Lab 05/19/19 0835 05/19/19 1146 06/01/19 1814 06/01/19 1919  TROPONINIHS 90* 82* 56* 56*      Chemistry Recent Labs  Lab 06/01/19 1814  06/03/19 0445 06/04/19 0441 06/05/19 0435  NA 139   < > 134* 135 131*  K 4.3   < > 4.1 4.5 4.7  CL 106   < > 102 102 100  CO2 27   < > 23 25 24   GLUCOSE 109*   < > 251* 235* 288*  BUN 21*   < >  20 19 34*  CREATININE 1.16*   < > 0.97 0.88 1.27*  CALCIUM 8.6*   < > 8.9 9.2 9.4  PROT 6.6  --   --   --   --   ALBUMIN 3.5  --   --   --   --   AST 15  --   --   --   --   ALT 11  --   --   --   --   ALKPHOS 66  --   --   --   --   BILITOT 0.5  --   --   --   --   GFRNONAA 51*   < > >60 >60 46*  GFRAA 59*   < > >60 >60 53*  ANIONGAP 6   < > 9 8 7    < > = values in this interval not displayed.     Hematology Recent Labs  Lab 06/01/19 1814 06/02/19 0449  WBC 8.4 7.3  RBC 3.77* 3.82*  HGB 10.1* 10.2*  HCT 32.2* 32.5*  MCV 85.4 85.1  MCH 26.8 26.7  MCHC 31.4 31.4  RDW 15.3 15.4  PLT 320 301    BNP Recent Labs  Lab 06/01/19 1814  BNP 358.0*     DDimer No results for input(s): DDIMER in the last 168 hours.   Radiology    Ct Chest Wo Contrast  Result Date: 06/04/2019 CLINICAL DATA:  Cough and shortness of breath. EXAM: CT CHEST WITHOUT  CONTRAST TECHNIQUE: Multidetector CT imaging of the chest was performed following the standard protocol without IV contrast. COMPARISON:  05/19/2019 FINDINGS: Cardiovascular: Intracardiac AICD leads are unchanged. The heart remains mildly enlarged. Interval small pericardial effusion with a maximum thickness of 7 mm. The main pulmonary artery remains dilated with a transverse diameter of 3.8 cm. Mediastinum/Nodes: Diffusely enlarged and minimally heterogeneous thyroid gland with a curvilinear calcification in the left lobe. No enlarged lymph nodes. Lungs/Pleura: Multiple small bilateral calcified granulomata are again demonstrated. No pleural fluid. Mild linear atelectasis/scarring in the lingula with improvement. Mild bilateral bullous changes. Upper Abdomen: Cholecystectomy clips. Musculoskeletal: Mild thoracic spine degenerative changes. IMPRESSION: 1. Interval small pericardial effusion with a maximum thickness of 7 mm. 2. Stable enlarged main pulmonary artery, possibly due to pulmonary arterial hypertension. 3. Stable mild cardiomegaly and mild changes of COPD. Emphysema (ICD10-J43.9). Electronically Signed   By: Claudie Revering M.D.   On: 06/04/2019 09:42    Cardiac Studies   Echocardiogram (05/21/2019):  1. Left ventricular ejection fraction, by visual estimation, is >75%. The left ventricle has hyperdynamic function. There is severely increased left ventricular hypertrophy.  2. Definity contrast agent was given IV to delineate the left ventricular endocardial borders.  3. Elevated mean left atrial pressure.  4. Hypertrophic cardiomyopathy.  5. Left ventricular diastolic Doppler parameters are consistent with pseudonormalization pattern of LV diastolic filling.  6. Global right ventricle was not well visualized.The right ventricular size is not well visualized. No increase in right ventricular wall thickness.  7. Left atrial size was mildly dilated.  8. Right atrial size was not well visualized.  9.  The pericardium was not well visualized. 10. Mild aortic valve annular calcification. 11. The mitral valve is degenerative. Mild mitral valve regurgitation. 12. The tricuspid valve is not well visualized. Tricuspid valve regurgitation was not visualized by color flow Doppler. 13. The aortic valve was not well visualized Aortic valve regurgitation is trivial by color flow Doppler. Mild aortic valve sclerosis without stenosis. 14.  The pulmonic valve was not well visualized. Pulmonic valve regurgitation is not visualized by color flow Doppler. 15. The inferior vena cava is normal in size with greater than 50% respiratory variability, suggesting right atrial pressure of 3 mmHg. 16. The interatrial septum was not well visualized.  Patient Profile     58 y.o. female with history of hypertrophic cardiomyopathy status post ICD, chronic HFpEF, and COPD, admitted with recurrent shortness of breath.  Assessment & Plan    Shortness of breath and cough: Symptoms are likely multifactorial including rhinovirus infection diagnosed during this hospitalization superimposed on HFpEF and COPD.  She appears grossly euvolemic, though volume exam is very challenging secondary to the patient's body habitus.  Slight bump in creatinine noted, which suggest that she may be approaching intravascular volume depletion.  Interestingly, however, weight is up 1 pound since yesterday.  Decrease furosemide to 40 mg p.o. daily with follow-up BMP tomorrow.  Increase metoprolol tartrate to 25 mg twice daily and add diltiazem 30 mg every 8 hours.  Agree with pulmonary recommendation for catheterization, though I would like for the patient to have recovered from her acute respiratory infection first.  Hypertrophic cardiomyopathy and acute on chronic HFpEF: As above, the patient appears grossly euvolemic, though volume exam is challenging in the setting of her obesity.  In the past, she has had only a mild LVOT gradient but low  output by catheterization at [facility] in 2018.  I worry that some of her symptoms may be due to a restrictive cardiomyopathy with low output.  Escalate metoprolol and diltiazem as above.  De-escalate diuresis given modest bump in creatinine.  Consider right and left heart catheterization (ideally with dual transducers to assess LVOT gradient) when the patient has recovered from her rhinovirus infection.   Nonobstructive coronary artery disease: Minimal CAD noted by catheterization 2018 at [facility].  I do not believe that her symptoms are primarily due to coronary insufficiency.  Continue aspirin and statin therapy.  Anticipate cardiac catheterization in the near future, though hopefully this can be deferred to the outpatient setting when the patient is recovered from her rhinovirus infection.  For questions or updates, please contact [place] Please consult [email or web address] for contact info under [facility].     Signed, Nelva Bush, MD  06/05/2019, 1:18 PM

## 2019-06-05 NOTE — Progress Notes (Signed)
Telemetry called and reported that patient had a 7 beat run of v-tach. Messaged Dr. Margaretmary Eddy and she ordered a EKG and cardiac consult with Dr. Saunders Revel.

## 2019-06-05 NOTE — Progress Notes (Signed)
pts blood sugar was taken twice first tome 419 second time 415 and sliding scale order to to notify MD for anything over 400. Spoke to Dr. Margaretmary Eddy and she said to give Novolog 20 units once and Levemir 7 units once.

## 2019-06-05 NOTE — Progress Notes (Signed)
[place] [facility] [place] NAME: Julie Bender    MR#:  [id number]  DATE OF BIRTH:  1961/07/03  SUBJECTIVE:  CHIEF COMPLAINT: Patient is clinically looking better but very anxious   REVIEW OF SYSTEMS:  CONSTITUTIONAL: No fever, fatigue or weakness.  EYES: No blurred or double vision.  EARS, NOSE, AND THROAT: No tinnitus or ear pain.  RESPIRATORY: Cough is better but reports shortness of breath with exertion,  denies  hemoptysis.  CARDIOVASCULAR: No chest pain, orthopnea, edema.  GASTROINTESTINAL: No nausea, vomiting, diarrhea or abdominal pain.  GENITOURINARY: No dysuria, hematuria.  ENDOCRINE: No polyuria, nocturia,  HEMATOLOGY: No anemia, easy bruising or bleeding SKIN: No rash or lesion. MUSCULOSKELETAL: No joint pain or arthritis.   NEUROLOGIC: No tingling, numbness, weakness.  PSYCHIATRY: No anxiety or depression.   DRUG ALLERGIES:  No Known Allergies  VITALS:  Blood pressure (!) 167/82, pulse 61, temperature 98 F (36.7 C), resp. rate (!) 22, height 5\' 4"  (1.626 m), weight 98.6 kg, SpO2 94 %.  PHYSICAL EXAMINATION:  GENERAL:  58 y.o.-year-old patient lying in the bed with no acute distress.  EYES: Pupils equal, round, reactive to light and accommodation. No scleral icterus. Extraocular muscles intact.  HEENT: Head atraumatic, normocephalic. Oropharynx and nasopharynx clear.  NECK:  Supple, no jugular venous distention. No thyroid enlargement, no tenderness.  LUNGS: Moderate breath sounds bilaterally with no wheezing, no rales,rhonchi or crepitation. No use of accessory muscles of respiration.  CARDIOVASCULAR: S1, S2 normal. No murmurs, rubs, or gallops.  ABDOMEN: Soft, nontender, nondistended. Bowel sounds present.  Abdominal swelling EXTREMITIES: No pedal edema, cyanosis, or clubbing.  NEUROLOGIC: Cranial nerves II through XII are intact. Muscle strength 5/5 in all extremities. Sensation intact. Gait not checked.   PSYCHIATRIC: The patient is alert and oriented x 3.  SKIN: No obvious rash, lesion, or ulcer.    LABORATORY PANEL:   CBC Recent Labs  Lab 06/02/19 0449  WBC 7.3  HGB 10.2*  HCT 32.5*  PLT 301   ------------------------------------------------------------------------------------------------------------------  Chemistries  Recent Labs  Lab 06/01/19 1814  06/05/19 0435  NA 139   < > 131*  K 4.3   < > 4.7  CL 106   < > 100  CO2 27   < > 24  GLUCOSE 109*   < > 288*  BUN 21*   < > 34*  CREATININE 1.16*   < > 1.27*  CALCIUM 8.6*   < > 9.4  AST 15  --   --   ALT 11  --   --   ALKPHOS 66  --   --   BILITOT 0.5  --   --    < > = values in this interval not displayed.   ------------------------------------------------------------------------------------------------------------------  Cardiac Enzymes No results for input(s): TROPONINI in the last 168 hours. ------------------------------------------------------------------------------------------------------------------  RADIOLOGY:  Ct Chest Wo Contrast  Result Date: 06/04/2019 CLINICAL DATA:  Cough and shortness of breath. EXAM: CT CHEST WITHOUT CONTRAST TECHNIQUE: Multidetector CT imaging of the chest was performed following the standard protocol without IV contrast. COMPARISON:  05/19/2019 FINDINGS: Cardiovascular: Intracardiac AICD leads are unchanged. The heart remains mildly enlarged. Interval small pericardial effusion with a maximum thickness of 7 mm. The main pulmonary artery remains dilated with a transverse diameter of 3.8 cm. Mediastinum/Nodes: Diffusely enlarged and minimally heterogeneous thyroid gland with a curvilinear calcification in the left lobe. No enlarged lymph nodes. Lungs/Pleura: Multiple small bilateral calcified granulomata are again demonstrated. No  pleural fluid. Mild linear atelectasis/scarring in the lingula with improvement. Mild bilateral bullous changes. Upper Abdomen: Cholecystectomy clips.  Musculoskeletal: Mild thoracic spine degenerative changes. IMPRESSION: 1. Interval small pericardial effusion with a maximum thickness of 7 mm. 2. Stable enlarged main pulmonary artery, possibly due to pulmonary arterial hypertension. 3. Stable mild cardiomegaly and mild changes of COPD. Emphysema (ICD10-J43.9). Electronically Signed   By: Claudie Revering M.D.   On: 06/04/2019 09:42    EKG:   Orders placed or performed during the hospital encounter of 06/01/19  . EKG 12-Lead  . EKG 12-Lead  . EKG 12-Lead  . EKG 12-Lead    ASSESSMENT AND PLAN:   #Acute respiratory distress secondary to acute rhinovirus infection, CHF with hypertensive heart disease, reactive airway disease exacerbation, hypertrophic obstructive cardiomyopathy On Lasix drip which is changed to Lasix 40 mg IV twice daily as patient does not clinically look significantly fluid overloaded and cardiology is not recommending over diuresing the patient -changed to Lasix p.o. monitor daily weights, intake and output Consulted primary cardiology group-[facility], appreciate cardiology.  Considering right heart catheterization once patient is clinically better recommendations Metoprolol is added to the regimen, to 25 mg Diltiazem 30 mg every 8 hours is added to the regimen COVID-19 is negative, repeat Covid test and respiratory panel are ordered as the patient is having persistent cough  #Acute reactive airway disease-with acute rhinovirus infection  continue IV Solu-Medrol and taper  given 1 dose of magnesium 2 g IV on 06/03/2019, inhalers and supportive treatment as needed DuoNebs every 6 hours, albuterol as needed Antitussives added to the regimen.  Patient is on Tussionex, Tessalon Perles scheduled and Robitussin as needed Pulmonology consult placed-Dr. Lanney Gins has seen the patient appreciate his recommendations.  Recommending right heart catheterization for full evaluation and treatment Repeat Covid test ordered-rapid  test  #Obstructive sleep apnea Needs CPAP nightly, patient is not wearing consistently  #Nonsustained V. tach 7 beats once Continue close monitoring We will get twelve-lead EKG If recurrent may consider amiodarone  #Anxiety Xanax as needed  #Small pericardial effusion Small pericardial effusion noticed on the CT chest, groundglass opacities are improving when compared to the previous scan Discussed with  Dr. Fletcher Anon, he will follow up the patient and not suggesting echocardiogram as the effusion is very small  #Constipation Dulcolax suppository, MiraLAX  #Obstructive sleep apnea CPAP nightly  #Diabetes mellitus sliding scale insulin and monitor sugars closely while patient is on steroids  #Essential hypertension blood pressure being soft holding all home blood pressure medications  #Insomnia patient is requesting Ambien will provide the same     All the records are reviewed and case discussed with Care Management/Social Workerr. Management plans discussed with the patient, she is in agreement.  CODE STATUS: fc  TOTAL TIME TAKING CARE OF THIS PATIENT: 35 minutes.   POSSIBLE D/C IN 2 DAYS, DEPENDING ON CLINICAL CONDITION.  Note: This dictation was prepared with Dragon dictation along with smaller phrase technology. Any transcriptional errors that result from this process are unintentional.   Nicholes Mango M.D on 06/05/2019 at 4:29 PM  Between 7am to 6pm - Pager - [phone] After 6pm go to [email or web address] - password [email or web address] [facility]  [facility]  Office  [phone]  CC: Primary care physician; Physicians, [place]

## 2019-06-05 NOTE — Plan of Care (Signed)
Patient has denied pain.  Stating breathing has improved. Independent in postioning self in bed.

## 2019-06-05 NOTE — Progress Notes (Signed)
Pulmonary Medicine          Date: 06/05/2019,   MRN# [id number] Clarabelle Rattan 1961-05-15     AdmissionWeight: 96.2 kg                 CurrentWeight: 98.6 kg      CHIEF COMPLAINT:   Severe shortness of breath    SUBJECTIVE   Clinically improved.  LRTI due to [facility]      PAST MEDICAL HISTORY   Past Medical History:  Diagnosis Date   CHF (congestive heart failure) (HCC)    COPD (chronic obstructive pulmonary disease) ([place])    Diabetes mellitus without complication ([place])    Diabetic retinopathy ([place])    History of placement of internal cardiac defibrillator    Hypertension    Hypertrophic cardiomyopathy ([place])      SURGICAL HISTORY   Past Surgical History:  Procedure Laterality Date   CARDIAC DEFIBRILLATOR PLACEMENT     CHOLECYSTECTOMY       FAMILY HISTORY   Family History  Problem Relation Age of Onset   Hypertrophic cardiomyopathy Sister      SOCIAL HISTORY   Social History   Tobacco Use   Smoking status: Passive Smoke Exposure - Never Smoker   Smokeless tobacco: Never Used  Substance Use Topics   Alcohol use: No   Drug use: Never     MEDICATIONS    Home Medication:    Current Medication:  Current Facility-Administered Medications:    acetaminophen (TYLENOL) tablet 650 mg, 650 mg, Oral, Q6H PRN, 650 mg at 06/05/19 0519 **OR** acetaminophen (TYLENOL) suppository 650 mg, 650 mg, Rectal, Q6H PRN, Lance Coon, MD   albuterol (PROVENTIL) (2.5 MG/3ML) 0.083% nebulizer solution 2.5 mg, 2.5 mg, Nebulization, Q2H PRN, Gouru, Aruna, MD   ALPRAZolam Duanne Moron) tablet 0.5 mg, 0.5 mg, Oral, TID PRN, Gouru, Aruna, MD, 0.5 mg at 06/05/19 1124   aspirin EC tablet 81 mg, 81 mg, Oral, Daily, Lance Coon, MD, 81 mg at 06/05/19 0916   atorvastatin (LIPITOR) tablet 40 mg, 40 mg, Oral, q1800, Lance Coon, MD, 40 mg at 06/04/19 1738   benzonatate (TESSALON) capsule 200 mg, 200 mg, Oral, TID, Gouru, Aruna, MD, 200 mg at  06/05/19 1545   bisacodyl (DULCOLAX) suppository 10 mg, 10 mg, Rectal, Daily PRN, Gouru, Aruna, MD, 10 mg at 06/03/19 1711   chlorpheniramine-HYDROcodone (TUSSIONEX) 10-8 MG/5ML suspension 5 mL, 5 mL, Oral, Q6H, Mohini Heathcock, MD, 5 mL at 06/05/19 1156   citalopram (CELEXA) tablet 20 mg, 20 mg, Oral, Daily, Lance Coon, MD, 20 mg at 06/05/19 0918   diltiazem (CARDIZEM) tablet 30 mg, 30 mg, Oral, Q8H, End, Christopher, MD, 30 mg at 06/05/19 1546   enoxaparin (LOVENOX) injection 40 mg, 40 mg, Subcutaneous, Q24H, Lance Coon, MD, 40 mg at 06/05/19 0519   [START ON 06/06/2019] furosemide (LASIX) tablet 40 mg, 40 mg, Oral, Daily, End, Harrell Gave, MD   guaiFENesin-dextromethorphan (ROBITUSSIN DM) 100-10 MG/5ML syrup 10 mL, 10 mL, Oral, Q4H PRN, Gouru, Aruna, MD, 10 mL at 06/05/19 0519   insulin aspart (novoLOG) injection 0-15 Units, 0-15 Units, Subcutaneous, TID AC & HS, Lance Coon, MD, 15 Units at 06/05/19 1157   insulin aspart (novoLOG) injection 20 Units, 20 Units, Subcutaneous, Once, Gouru, Aruna, MD   insulin aspart (novoLOG) injection 6 Units, 6 Units, Subcutaneous, TID WC, Gouru, Aruna, MD, 6 Units at 06/05/19 1157   insulin detemir (LEVEMIR) injection 40 Units, 40 Units, Subcutaneous, Daily, Gouru, Aruna, MD, 40 Units at 06/05/19 [id number]  insulin detemir (LEVEMIR) injection 7 Units, 7 Units, Subcutaneous, Once, Gouru, Aruna, MD   ipratropium-albuterol (DUONEB) 0.5-2.5 (3) MG/3ML nebulizer solution 3 mL, 3 mL, Inhalation, Q4H, Gouru, Aruna, MD, 3 mL at 06/05/19 1537   magnesium sulfate IVPB 2 g 50 mL, 2 g, Intravenous, Once, Gouru, Aruna, MD, Stopped at 06/05/19 1041   methylPREDNISolone sodium succinate (SOLU-MEDROL) 40 mg/mL injection 40 mg, 40 mg, Intravenous, Q12H, Gouru, Aruna, MD   metoprolol tartrate (LOPRESSOR) tablet 25 mg, 25 mg, Oral, BID, End, Harrell Gave, MD   ondansetron (ZOFRAN) tablet 4 mg, 4 mg, Oral, Q6H PRN **OR** ondansetron (ZOFRAN) injection 4 mg, 4  mg, Intravenous, Q6H PRN, Lance Coon, MD   pantoprazole (PROTONIX) EC tablet 40 mg, 40 mg, Oral, BID AC, Lance Coon, MD, 40 mg at 06/05/19 0918   polyethylene glycol (MIRALAX / GLYCOLAX) packet 17 g, 17 g, Oral, Daily, Gouru, Aruna, MD, 17 g at 06/05/19 0927   sodium chloride flush (NS) 0.9 % injection 3 mL, 3 mL, Intravenous, Q12H, Gouru, Aruna, MD, 3 mL at 06/05/19 0928   sodium chloride flush (NS) 0.9 % injection 3 mL, 3 mL, Intravenous, PRN, Gouru, Aruna, MD, 3 mL at 06/03/19 1705   sodium phosphate (FLEET) 7-19 GM/118ML enema 1 enema, 1 enema, Rectal, Daily PRN, Gouru, Aruna, MD   zolpidem (AMBIEN) tablet 5 mg, 5 mg, Oral, QHS, Gouru, Aruna, MD, 5 mg at 06/04/19 2204    ALLERGIES   Patient has no known allergies.     REVIEW OF SYSTEMS    Review of Systems:  Gen:  Denies  fever, sweats, chills weigh loss  HEENT: Denies blurred vision, double vision, ear pain, eye pain, hearing loss, nose bleeds, sore throat Cardiac:  No dizziness, chest pain or heaviness, chest tightness,edema Resp:   Denies cough or sputum porduction, shortness of breath,wheezing, hemoptysis,  Gi: Denies swallowing difficulty, stomach pain, nausea or vomiting, diarrhea, constipation, bowel incontinence Gu:  Denies bladder incontinence, burning urine Ext:   Denies Joint pain, stiffness or swelling Skin: Denies  skin rash, easy bruising or bleeding or hives Endoc:  Denies polyuria, polydipsia , polyphagia or weight change Psych:   Denies depression, insomnia or hallucinations   Other:  All other systems negative   VS: BP (!) 167/82 (BP Location: Right Arm)    Pulse 61    Temp 98 F (36.7 C)    Resp (!) 22    Ht 5\' 4"  (1.626 m)    Wt 98.6 kg    SpO2 94%    BMI 37.31 kg/m      PHYSICAL EXAM    GENERAL:NAD, no fevers, chills, no weakness no fatigue HEAD: Normocephalic, atraumatic.  EYES: Pupils equal, round, reactive to light. Extraocular muscles intact. No scleral icterus.  MOUTH: Moist  mucosal membrane. Dentition intact. No abscess noted.  EAR, NOSE, THROAT: Clear without exudates. No external lesions.  NECK: Supple. No thyromegaly. No nodules. No JVD.  PULMONARY: mild rhonchi with no crackles CARDIOVASCULAR: S1 and S2. Regular rate and rhythm. No murmurs, rubs, or gallops. No edema. Pedal pulses 2+ bilaterally.  GASTROINTESTINAL: Soft, nontender, nondistended. No masses. Positive bowel sounds. No hepatosplenomegaly.  MUSCULOSKELETAL: No swelling, clubbing, or edema. Range of motion full in all extremities.  NEUROLOGIC: Cranial nerves II through XII are intact. No gross focal neurological deficits. Sensation intact. Reflexes intact.  SKIN: No ulceration, lesions, rashes, or cyanosis. Skin warm and dry. Turgor intact.  PSYCHIATRIC: Mood, affect within normal limits. The patient is awake, alert and oriented x  3. Insight, judgment intact.       IMAGING    Dg Chest 2 View  Result Date: 05/19/2019 CLINICAL DATA:  Bilateral lower extremity pain. Shortness of breath. EXAM: CHEST - 2 VIEW COMPARISON:  02/23/2019 FINDINGS: There is mild bilateral interstitial thickening. There is no focal consolidation. There is no pleural effusion or pneumothorax. There is stable cardiomegaly. There is a dual lead cardiac pacemaker. There is no acute osseous abnormality. IMPRESSION: Cardiomegaly with mild pulmonary vascular congestion. Electronically Signed   By: Kathreen Devoid   On: 05/19/2019 09:22   Ct Chest Wo Contrast  Result Date: 06/04/2019 CLINICAL DATA:  Cough and shortness of breath. EXAM: CT CHEST WITHOUT CONTRAST TECHNIQUE: Multidetector CT imaging of the chest was performed following the standard protocol without IV contrast. COMPARISON:  05/19/2019 FINDINGS: Cardiovascular: Intracardiac AICD leads are unchanged. The heart remains mildly enlarged. Interval small pericardial effusion with a maximum thickness of 7 mm. The main pulmonary artery remains dilated with a transverse diameter of  3.8 cm. Mediastinum/Nodes: Diffusely enlarged and minimally heterogeneous thyroid gland with a curvilinear calcification in the left lobe. No enlarged lymph nodes. Lungs/Pleura: Multiple small bilateral calcified granulomata are again demonstrated. No pleural fluid. Mild linear atelectasis/scarring in the lingula with improvement. Mild bilateral bullous changes. Upper Abdomen: Cholecystectomy clips. Musculoskeletal: Mild thoracic spine degenerative changes. IMPRESSION: 1. Interval small pericardial effusion with a maximum thickness of 7 mm. 2. Stable enlarged main pulmonary artery, possibly due to pulmonary arterial hypertension. 3. Stable mild cardiomegaly and mild changes of COPD. Emphysema (ICD10-J43.9). Electronically Signed   By: Claudie Revering M.D.   On: 06/04/2019 09:42   Ct Angio Chest Pe W And/or Wo Contrast  Result Date: 05/19/2019 CLINICAL DATA:  Bilateral lower extremity pain and shortness of breath. EXAM: CT ANGIOGRAPHY CHEST CT ABDOMEN AND PELVIS WITH CONTRAST TECHNIQUE: Multidetector CT imaging of the chest was performed using the standard protocol during bolus administration of intravenous contrast. Multiplanar CT image reconstructions and MIPs were obtained to evaluate the vascular anatomy. Multidetector CT imaging of the abdomen and pelvis was performed using the standard protocol during bolus administration of intravenous contrast. CONTRAST:  112mL OMNIPAQUE IOHEXOL 350 MG/ML SOLN COMPARISON:  CT dated 04/18/2018 FINDINGS: CTA CHEST FINDINGS Cardiovascular: Contrast injection is sufficient to demonstrate satisfactory opacification of the pulmonary arteries to the segmental level. There is no pulmonary embolus. The main pulmonary artery is dilated measuring approximately 3.8 cm in diameter. There is no CT evidence of acute right heart strain. There are mild atherosclerotic changes of the visualized thoracic aorta. Heart size is enlarged. There is significant wall thickening of the left ventricle.  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 There is no significant pericardial effusion. Mediastinum/Nodes: --No mediastinal or hilar lymphadenopathy. --No axillary lymphadenopathy. --No supraclavicular lymphadenopathy. --a large left-sided thyroid nodule is again noted. --The esophagus is unremarkable Lungs/Pleura: The lung volumes are low. The trachea is unremarkable. There is a trace right-sided pleural effusion. There is no pneumothorax or focal infiltrate. There is a small amount of atelectasis in the lingula. Musculoskeletal: No chest wall abnormality. No acute or significant osseous findings. Review of the MIP images confirms the above findings. CT ABDOMEN and PELVIS FINDINGS Hepatobiliary: The liver is normal. Status post cholecystectomy.There is no biliary ductal dilation. Pancreas: Normal contours without ductal dilatation. No peripancreatic fluid collection. Spleen: No splenic laceration or hematoma. Adrenals/Urinary Tract: --Adrenal glands: No adrenal hemorrhage. --Right kidney/ureter: No hydronephrosis or perinephric hematoma. --Left kidney/ureter: No hydronephrosis or perinephric hematoma. --Urinary bladder: Unremarkable.  Stomach/Bowel: --Stomach/Duodenum: No hiatal hernia or other gastric abnormality. Normal duodenal course and caliber. --Small bowel: No dilatation or inflammation. --Colon: No focal abnormality. --Appendix: Normal. Vascular/Lymphatic: Atherosclerotic calcification is present within the non-aneurysmal abdominal aorta, without hemodynamically significant stenosis. --No retroperitoneal lymphadenopathy. --No mesenteric lymphadenopathy. --No pelvic or inguinal lymphadenopathy. Reproductive: Unremarkable Other: There is a partially visualized 1.7 x 1.2 cm subcutaneous nodule in the left gluteal fold. The abdominal wall is normal. Musculoskeletal. No acute displaced fractures. Review of the MIP images confirms the above findings. IMPRESSION: 1. No acute thoracic, abdominal or pelvic injury. 2.  Cardiomegaly with significant wall thickening of the left ventricle. 3. Trace right-sided pleural effusion. 4. Dilated main pulmonary artery which can be seen in patients with elevated pulmonary artery pressures. 5. Partially visualized 1.7 x 1.2 cm subcutaneous nodule in the left gluteal fold. Recommend correlation with physical examination. This could represent a sebaceous cyst. Aortic Atherosclerosis (ICD10-I70.0). Electronically Signed   By: Constance Holster M.D.   On: 05/19/2019 11:06   Ct Abdomen Pelvis W Contrast  Result Date: 05/19/2019 CLINICAL DATA:  Bilateral lower extremity pain and shortness of breath. EXAM: CT ANGIOGRAPHY CHEST CT ABDOMEN AND PELVIS WITH CONTRAST TECHNIQUE: Multidetector CT imaging of the chest was performed using the standard protocol during bolus administration of intravenous contrast. Multiplanar CT image reconstructions and MIPs were obtained to evaluate the vascular anatomy. Multidetector CT imaging of the abdomen and pelvis was performed using the standard protocol during bolus administration of intravenous contrast. CONTRAST:  128mL OMNIPAQUE IOHEXOL 350 MG/ML SOLN COMPARISON:  CT dated 04/18/2018 FINDINGS: CTA CHEST FINDINGS Cardiovascular: Contrast injection is sufficient to demonstrate satisfactory opacification of the pulmonary arteries to the segmental level. There is no pulmonary embolus. The main pulmonary artery is dilated measuring approximately 3.8 cm in diameter. There is no CT evidence of acute right heart strain. There are mild atherosclerotic changes of the visualized thoracic aorta. Heart size is enlarged. There is significant wall thickening of the left ventricle. 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 There is no significant pericardial effusion. Mediastinum/Nodes: --No mediastinal or hilar lymphadenopathy. --No axillary lymphadenopathy. --No supraclavicular lymphadenopathy. --a large left-sided thyroid nodule is again noted. --The esophagus is unremarkable  Lungs/Pleura: The lung volumes are low. The trachea is unremarkable. There is a trace right-sided pleural effusion. There is no pneumothorax or focal infiltrate. There is a small amount of atelectasis in the lingula. Musculoskeletal: No chest wall abnormality. No acute or significant osseous findings. Review of the MIP images confirms the above findings. CT ABDOMEN and PELVIS FINDINGS Hepatobiliary: The liver is normal. Status post cholecystectomy.There is no biliary ductal dilation. Pancreas: Normal contours without ductal dilatation. No peripancreatic fluid collection. Spleen: No splenic laceration or hematoma. Adrenals/Urinary Tract: --Adrenal glands: No adrenal hemorrhage. --Right kidney/ureter: No hydronephrosis or perinephric hematoma. --Left kidney/ureter: No hydronephrosis or perinephric hematoma. --Urinary bladder: Unremarkable. Stomach/Bowel: --Stomach/Duodenum: No hiatal hernia or other gastric abnormality. Normal duodenal course and caliber. --Small bowel: No dilatation or inflammation. --Colon: No focal abnormality. --Appendix: Normal. Vascular/Lymphatic: Atherosclerotic calcification is present within the non-aneurysmal abdominal aorta, without hemodynamically significant stenosis. --No retroperitoneal lymphadenopathy. --No mesenteric lymphadenopathy. --No pelvic or inguinal lymphadenopathy. Reproductive: Unremarkable Other: There is a partially visualized 1.7 x 1.2 cm subcutaneous nodule in the left gluteal fold. The abdominal wall is normal. Musculoskeletal. No acute displaced fractures. Review of the MIP images confirms the above findings. IMPRESSION: 1. No acute thoracic, abdominal or pelvic injury. 2. Cardiomegaly with significant wall thickening of the left ventricle. 3. Trace  right-sided pleural effusion. 4. Dilated main pulmonary artery which can be seen in patients with elevated pulmonary artery pressures. 5. Partially visualized 1.7 x 1.2 cm subcutaneous nodule in the left gluteal fold.  Recommend correlation with physical examination. This could represent a sebaceous cyst. Aortic Atherosclerosis (ICD10-I70.0). Electronically Signed   By: Constance Holster M.D.   On: 05/19/2019 11:06   Dg Chest Portable 1 View  Result Date: 06/01/2019 CLINICAL DATA:  Cough, body aches and fatigue for 5 days EXAM: PORTABLE CHEST 1 VIEW COMPARISON:  Radiograph 05/25/2019, CT angio 05/19/2019 FINDINGS: Hazy interstitial opacities throughout the lungs with a basilar predominance are mild central venous congestion with cardiomegaly which is similar to comparison exam. Dual lead pacer pack overlies the left chest wall with the lead at the cardiac apex and right atrium. No pneumothorax. No visible effusion. No acute osseous or soft tissue abnormality. IMPRESSION: 1. Unchanged bilateral diffuse interstitial opacities and central venous congestion likely reflecting some interstitial edema. 2. Stable cardiomegaly. Electronically Signed   By: Lovena Le M.D.   On: 06/01/2019 17:04   Dg Chest Port 1 View  Result Date: 05/25/2019 CLINICAL DATA:  Shortness of breath on exertion EXAM: PORTABLE CHEST 1 VIEW COMPARISON:  Chest radiograph dated 05/19/2019 and CT chest dated 05/19/2019. FINDINGS: A left subclavian approach cardiac device is redemonstrated. A 2.3 cm long radiopacity overlies the cardiac silhouette and may be external to the patient. The heart remains enlarged. Vascular calcifications are seen in the aortic arch. There is a mild diffuse bilateral interstitial opacities which are not significantly changed. There is no pleural effusion or pneumothorax. IMPRESSION: 1. No significant interval change in the mild diffuse interstitial opacities. 2. A 2.3 cm long radiopacity overlies the cardiac silhouette and may be external to the patient. 3. Stable cardiomegaly. Electronically Signed   By: Zerita Boers M.D.   On: 05/25/2019 11:00             ASSESSMENT/PLAN    SOB and cough due to Rhinovirus  infection  Complicated by hypertrophic cardiomyopathy and PAH - reviewed serial CT chest - diffuse GGO is improved from previous done 2 weeks ago  - she does feel relief from albuterol  - patient endorses some symptoms of anxiety -PH is likely pre and post capillary Group 2 and 1 - she is currently NYHA/WHO 3-4 - would recommend [facility] for full evaluation and tx , can be done on outpatient    Obstructive sleep apnea diagnostic sleep study 10/02/2017>>  PLM index of 132, PLM arousal index of 13.7.  Mild sleep apnea with an AHI of 7.1.  CPAP titration study was recommended as well as work-up for periodic limb movement disorder. CPAP titration study 12/05/2017>> PLM index 65, PLM arousal index of 2.  Hypopnea index remained elevated above 10 and all pressures until a pressure of 10 was achieved and AHI was 0. CPAP of 10 was recommended.  -patient did not wear consistently.        Thank you for allowing me to participate in the care of this patient.    Patient/Family are satisfied with care plan and all questions have been answered.  This document was prepared using Dragon voice recognition software and may include unintentional dictation errors.     Ottie Glazier, M.D.  Division of [place]

## 2019-06-05 NOTE — Progress Notes (Signed)
pts spo2 97 and sitting on the side of the bed and she is stating that something doesnt feel right she feels like she cant breathe she is having SOB but she is breathing with no problems and you can tell when she talks that she is SOB she said she cant lay down. Messaged Dr. Margaretmary Eddy and she ordered Xanax PRN for patient as well as assessed patient.

## 2019-06-05 NOTE — Care Management Important Message (Signed)
Important Message  Patient Details  Name: Julie Bender MRN: [id number] Date of Birth: 03-02-1961   Medicare Important Message Given:  Yes     [name] [name] 06/05/2019, 10:45 AM

## 2019-06-06 DIAGNOSIS — F411 Generalized anxiety disorder: Secondary | ICD-10-CM

## 2019-06-06 DIAGNOSIS — I110 Hypertensive heart disease with heart failure: Secondary | ICD-10-CM | POA: Diagnosis not present

## 2019-06-06 LAB — MAGNESIUM
Magnesium: 2.4 mg/dL (ref 1.7–2.4)
Magnesium: 2.3 mg/dL (ref 1.7–2.4)

## 2019-06-06 LAB — GLUCOSE, CAPILLARY
Glucose-Capillary: 130 mg/dL — ABNORMAL HIGH (ref 70–99)
Glucose-Capillary: 317 mg/dL — ABNORMAL HIGH (ref 70–99)
Glucose-Capillary: 271 mg/dL — ABNORMAL HIGH (ref 70–99)
Glucose-Capillary: 116 mg/dL — ABNORMAL HIGH (ref 70–99)

## 2019-06-06 LAB — BASIC METABOLIC PANEL
Anion gap: 11 (ref 5–15)
BUN: 34 mg/dL — ABNORMAL HIGH (ref 6–20)
CO2: 25 mmol/L (ref 22–32)
Calcium: 9.7 mg/dL (ref 8.9–10.3)
Chloride: 101 mmol/L (ref 98–111)
Creatinine, Ser: 0.81 mg/dL (ref 0.44–1.00)
GFR calc Af Amer: >60 mL/min (ref >60)
GFR calc non Af Amer: >60 mL/min (ref >60)
Glucose, Bld: 160 mg/dL — ABNORMAL HIGH (ref 70–99)
Potassium: 4.5 mmol/L (ref 3.5–5.1)
Sodium: 137 mmol/L (ref 135–145)

## 2019-06-06 MED ORDER — DILTIAZEM HCL 30 MG PO TABS
60.0000 mg | ORAL_TABLET | Freq: Three times a day (TID) | ORAL | Status: DC
  Administered 2019-06-07 – 2019-06-09 (×7): 60 mg via ORAL
  Filled 2019-06-06 (×8): qty 2

## 2019-06-06 MED ORDER — INSULIN ASPART 100 UNIT/ML ~~LOC~~ SOLN
12.0000 [IU] | Freq: Three times a day (TID) | SUBCUTANEOUS | Status: DC
  Administered 2019-06-06 – 2019-06-09 (×7): 12 [IU] via SUBCUTANEOUS
  Filled 2019-06-06 (×7): qty 1

## 2019-06-06 MED ORDER — ALPRAZOLAM 0.5 MG PO TABS
0.5000 mg | ORAL_TABLET | ORAL | Status: DC | PRN
  Administered 2019-06-07 – 2019-06-08 (×4): 0.5 mg via ORAL
  Filled 2019-06-06 (×4): qty 1

## 2019-06-06 MED ORDER — INSULIN DETEMIR 100 UNIT/ML ~~LOC~~ SOLN
42.0000 [IU] | Freq: Every day | SUBCUTANEOUS | Status: DC
  Administered 2019-06-07 – 2019-06-09 (×3): 42 [IU] via SUBCUTANEOUS
  Filled 2019-06-06 (×4): qty 0.42

## 2019-06-06 MED ORDER — METHYLPREDNISOLONE SODIUM SUCC 40 MG IJ SOLR
40.0000 mg | Freq: Every day | INTRAMUSCULAR | Status: DC
  Administered 2019-06-07 – 2019-06-08 (×2): 40 mg via INTRAVENOUS
  Filled 2019-06-06 (×2): qty 1

## 2019-06-06 MED ORDER — CITALOPRAM HYDROBROMIDE 20 MG PO TABS
40.0000 mg | ORAL_TABLET | Freq: Every day | ORAL | Status: DC
  Administered 2019-06-07 – 2019-06-09 (×3): 40 mg via ORAL
  Filled 2019-06-06 (×3): qty 2

## 2019-06-06 MED ORDER — IPRATROPIUM-ALBUTEROL 0.5-2.5 (3) MG/3ML IN SOLN
3.0000 mL | Freq: Four times a day (QID) | RESPIRATORY_TRACT | Status: DC
  Administered 2019-06-06 – 2019-06-09 (×12): 3 mL via RESPIRATORY_TRACT
  Filled 2019-06-06 (×12): qty 3

## 2019-06-06 NOTE — Consult Note (Signed)
[facility] Face-to-Face Psychiatry Consult    Reason for Consult: Anxiety Referring Physician: Dr. Meryl Dare Patient Identification: Julie Bender MRN:  [id number] Principal Diagnosis: Hypertensive heart disease with heart failure [facility]) Diagnosis:  Principal Problem:   Hypertensive heart disease with heart failure ([place]) Active Problems:   OSA (obstructive sleep apnea)   Diabetes mellitus ([place])   Essential hypertension   Asthma exacerbation   Total Time spent with patient: 30 minutes  Subjective:   Julie Bender is a 58 y.o. female patient admitted with respiratory distress.  HPI: Patient is a 58 year old female with respiratory distress and anxiety.  Psychiatry was consulted because patient's anxiety seems to be preventing her from sleeping or being able to engage in treatment successfully.  Patient acknowledges a history of anxiety.  Patient states that she has been taking Celexa for some time recently started Xanax during this hospitalization.  Patient states Xanax helps a little bit but does not seem frequent enough to deal with her symptoms.  Patient states that she feels so anxious at times of her answer from sleeping.  Patient states he is afraid to sleep because she is afraid she will stop breathing.  Patient was reassured that she is hooked up to telemetry and therefore in a safe place with the attention of medical staff however this was not enough to abate this patient symptoms.  Patient is agreeable to an increase in her alprazolam medication in order to better combat her anxiety.  Patient denies any suicidal thinking.  Patient denies any paranoia or homicidal thinking.  Patient denies any psychotic symptoms.  Past Psychiatric History: Patient states she has been treated for anxiety in the past by her primary care doctor.  She has been on Celexa for approximately 3 years.  Patient has been on the dose for significant amount of time.  Patient requested an increase in dose.  Risk to Self:   No Risk to Others:  No Prior Inpatient Therapy:  No Prior Outpatient Therapy:  No  Past Medical History:  Past Medical History:  Diagnosis Date  . CHF (congestive heart failure) ([place])   . COPD (chronic obstructive pulmonary disease) ([place])   . Diabetes mellitus without complication ([place])   . Diabetic retinopathy ([place])   . History of placement of internal cardiac defibrillator   . Hypertension   . Hypertrophic cardiomyopathy ([place])     Past Surgical History:  Procedure Laterality Date  . CARDIAC DEFIBRILLATOR PLACEMENT    . CHOLECYSTECTOMY     Family History:  Family History  Problem Relation Age of Onset  . Hypertrophic cardiomyopathy Sister    Family Psychiatric  History: Patient unsure Social History:  Social History   Substance and Sexual Activity  Alcohol Use No     Social History   Substance and Sexual Activity  Drug Use Never    Social History   Socioeconomic History  . Marital status: Divorced    Spouse name: Not on file  . Number of children: Not on file  . Years of education: Not on file  . Highest education level: Not on file  Occupational History  . Not on file  Social Needs  . Financial resource strain: Not on file  . Food insecurity    Worry: Not on file    Inability: Not on file  . Transportation needs    Medical: Not on file    Non-medical: Not on file  Tobacco Use  . Smoking status: Passive Smoke Exposure - Never Smoker  . Smokeless  tobacco: Never Used  Substance and Sexual Activity  . Alcohol use: No  . Drug use: Never  . Sexual activity: Not on file  Lifestyle  . Physical activity    Days per week: Not on file    Minutes per session: Not on file  . Stress: Not on file  Relationships  . Social Herbalist on phone: Not on file    Gets together: Not on file    Attends religious service: Not on file    Active member of club or organization: Not on file    Attends meetings of clubs or organizations: Not on file     Relationship status: Not on file  Other Topics Concern  . Not on file  Social History Narrative  . Not on file   Additional Social History:    Allergies:  No Known Allergies  Labs:  Results for orders placed or performed during the hospital encounter of 06/01/19 (from the past 48 hour(s))  Glucose, capillary     Status: Abnormal   Collection Time: 06/04/19  9:29 PM  Result Value Ref Range   Glucose-Capillary 305 (H) 70 - 99 mg/dL   Comment 1 Notify RN   Basic metabolic panel     Status: Abnormal   Collection Time: 06/05/19  4:35 AM  Result Value Ref Range   Sodium 131 (L) 135 - 145 mmol/L   Potassium 4.7 3.5 - 5.1 mmol/L   Chloride 100 98 - 111 mmol/L   CO2 24 22 - 32 mmol/L   Glucose, Bld 288 (H) 70 - 99 mg/dL   BUN 34 (H) 6 - 20 mg/dL   Creatinine, Ser 1.27 (H) 0.44 - 1.00 mg/dL   Calcium 9.4 8.9 - 10.3 mg/dL   GFR calc non Af Amer 46 (L) >60 mL/min   GFR calc Af Amer 53 (L) >60 mL/min   Anion gap 7 5 - 15    Comment: Performed at [facility], [address] [place], [place] [address]  Glucose, capillary     Status: Abnormal   Collection Time: 06/05/19  7:41 AM  Result Value Ref Range   Glucose-Capillary 285 (H) 70 - 99 mg/dL  Glucose, capillary     Status: Abnormal   Collection Time: 06/05/19 11:29 AM  Result Value Ref Range   Glucose-Capillary 356 (H) 70 - 99 mg/dL  Glucose, capillary     Status: Abnormal   Collection Time: 06/05/19  4:47 PM  Result Value Ref Range   Glucose-Capillary 419 (H) 70 - 99 mg/dL   Comment 1 Notify RN   Glucose, capillary     Status: Abnormal   Collection Time: 06/05/19  4:50 PM  Result Value Ref Range   Glucose-Capillary 415 (H) 70 - 99 mg/dL   Comment 1 Notify RN   Glucose, capillary     Status: Abnormal   Collection Time: 06/05/19  8:48 PM  Result Value Ref Range   Glucose-Capillary 366 (H) 70 - 99 mg/dL  Basic metabolic panel     Status: Abnormal   Collection Time: 06/06/19  3:55 AM  Result Value Ref Range    Sodium 137 135 - 145 mmol/L   Potassium 4.5 3.5 - 5.1 mmol/L   Chloride 101 98 - 111 mmol/L   CO2 25 22 - 32 mmol/L   Glucose, Bld 160 (H) 70 - 99 mg/dL   BUN 34 (H) 6 - 20 mg/dL   Creatinine, Ser 0.81 0.44 - 1.00 mg/dL  Calcium 9.7 8.9 - 10.3 mg/dL   GFR calc non Af Amer >60 >60 mL/min   GFR calc Af Amer >60 >60 mL/min   Anion gap 11 5 - 15    Comment: Performed at [facility], [address] [place], [place] [address]  Magnesium     Status: None   Collection Time: 06/06/19  3:55 AM  Result Value Ref Range   Magnesium 2.4 1.7 - 2.4 mg/dL    Comment: Performed at [facility], [address] [place], [place] [address]  Glucose, capillary     Status: Abnormal   Collection Time: 06/06/19  7:54 AM  Result Value Ref Range   Glucose-Capillary 130 (H) 70 - 99 mg/dL  Glucose, capillary     Status: Abnormal   Collection Time: 06/06/19 11:48 AM  Result Value Ref Range   Glucose-Capillary 317 (H) 70 - 99 mg/dL  Magnesium     Status: None   Collection Time: 06/06/19  3:22 PM  Result Value Ref Range   Magnesium 2.3 1.7 - 2.4 mg/dL    Comment: Performed at [facility], [address] [place], [place] [address]  Glucose, capillary     Status: Abnormal   Collection Time: 06/06/19  4:49 PM  Result Value Ref Range   Glucose-Capillary 271 (H) 70 - 99 mg/dL    Current Facility-Administered Medications  Medication Dose Route Frequency Provider Last Rate Last Dose  . acetaminophen (TYLENOL) tablet 650 mg  650 mg Oral Q6H PRN Lance Coon, MD   650 mg at 06/05/19 [id number]   Or  . acetaminophen (TYLENOL) suppository 650 mg  650 mg Rectal Q6H PRN Lance Coon, MD      . albuterol (PROVENTIL) (2.5 MG/3ML) 0.083% nebulizer solution 2.5 mg  2.5 mg Nebulization Q2H PRN Gouru, Aruna, MD      . ALPRAZolam Duanne Moron) tablet 0.5 mg  0.5 mg Oral Q4H PRN Tanieka Pownall, Dorene Ar, MD      . aspirin EC tablet 81 mg  81 mg Oral Daily Lance Coon, MD   81 mg at 06/06/19 0920  .  atorvastatin (LIPITOR) tablet 40 mg  40 mg Oral q1800 Lance Coon, MD   40 mg at 06/06/19 1724  . benzonatate (TESSALON) capsule 200 mg  200 mg Oral TID Nicholes Mango, MD   200 mg at 06/06/19 1725  . bisacodyl (DULCOLAX) suppository 10 mg  10 mg Rectal Daily PRN Gouru, Aruna, MD   10 mg at 06/03/19 1711  . chlorpheniramine-HYDROcodone (TUSSIONEX) 10-8 MG/5ML suspension 5 mL  5 mL Oral Q6H Ottie Glazier, MD   5 mL at 06/06/19 1725  . citalopram (CELEXA) tablet 20 mg  20 mg Oral Daily Lance Coon, MD   20 mg at 06/06/19 [id number]  . diltiazem (CARDIZEM) tablet 60 mg  60 mg Oral Q8H Gouru, Aruna, MD      . enoxaparin (LOVENOX) injection 40 mg  40 mg Subcutaneous Q24H Lance Coon, MD   40 mg at 06/06/19 0608  . furosemide (LASIX) tablet 40 mg  40 mg Oral Daily End, Christopher, MD   40 mg at 06/06/19 0920  . guaiFENesin-dextromethorphan (ROBITUSSIN DM) 100-10 MG/5ML syrup 10 mL  10 mL Oral Q4H PRN Gouru, Aruna, MD   10 mL at 06/06/19 0052  . hydrALAZINE (APRESOLINE) injection 10 mg  10 mg Intravenous Q4H PRN Lance Coon, MD   10 mg at 06/05/19 2043  . insulin aspart (novoLOG) injection 0-15 Units  0-15 Units Subcutaneous TID AC &  HS Lance Coon, MD   8 Units at 06/06/19 1726  . insulin aspart (novoLOG) injection 12 Units  12 Units Subcutaneous TID WC Gouru, Aruna, MD   12 Units at 06/06/19 1726  . [START ON 06/07/2019] insulin detemir (LEVEMIR) injection 42 Units  42 Units Subcutaneous Daily Gouru, Aruna, MD      . ipratropium-albuterol (DUONEB) 0.5-2.5 (3) MG/3ML nebulizer solution 3 mL  3 mL Inhalation Q6H Gouru, Aruna, MD   3 mL at 06/06/19 1403  . magnesium sulfate IVPB 2 g 50 mL  2 g Intravenous Once Nicholes Mango, MD   Stopped at 06/05/19 1041  . [START ON 06/07/2019] methylPREDNISolone sodium succinate (SOLU-MEDROL) 40 mg/mL injection 40 mg  40 mg Intravenous Daily Gouru, Aruna, MD      . metoprolol tartrate (LOPRESSOR) tablet 25 mg  25 mg Oral BID End, Christopher, MD   25 mg at 06/06/19  0920  . ondansetron (ZOFRAN) tablet 4 mg  4 mg Oral Q6H PRN Lance Coon, MD       Or  . ondansetron [facility]) injection 4 mg  4 mg Intravenous Q6H PRN Lance Coon, MD      . oxyCODONE (Oxy IR/ROXICODONE) immediate release tablet 5 mg  5 mg Oral Q6H PRN Lance Coon, MD   5 mg at 06/05/19 2043  . pantoprazole (PROTONIX) EC tablet 40 mg  40 mg Oral BID Kenn File, MD   40 mg at 06/06/19 1725  . polyethylene glycol (MIRALAX / GLYCOLAX) packet 17 g  17 g Oral Daily Gouru, Aruna, MD   17 g at 06/06/19 0921  . sodium chloride flush (NS) 0.9 % injection 3 mL  3 mL Intravenous Q12H Gouru, Aruna, MD   3 mL at 06/06/19 0927  . sodium chloride flush (NS) 0.9 % injection 3 mL  3 mL Intravenous PRN Gouru, Aruna, MD   3 mL at 06/03/19 1705  . sodium phosphate (FLEET) 7-19 GM/118ML enema 1 enema  1 enema Rectal Daily PRN Nicholes Mango, MD        Musculoskeletal: Strength & Muscle Tone: within normal limits Gait & Station: normal Patient leans: N/A  Psychiatric Specialty Exam: Physical Exam  Review of Systems  Constitutional: Negative for fever.  HENT: Negative for hearing loss.   Eyes: Negative for blurred vision.  Respiratory: Positive for shortness of breath.   Cardiovascular: Negative for chest pain.  Skin: Negative for rash.  Psychiatric/Behavioral: Negative for depression, hallucinations, memory loss, substance abuse and suicidal ideas. The patient is nervous/anxious and has insomnia.     Blood pressure (!) 160/75, pulse 60, temperature 98.6 F (37 C), resp. rate (!) 21, height 5\' 4"  (1.626 m), weight 98.6 kg, SpO2 95 %.Body mass index is 37.31 kg/m.  General Appearance: Fairly Groomed  Eye Contact:  Fair  Speech:  Pressured  Volume:  Normal  Mood:  Anxious  Affect:  Full Range  Thought Process:  Linear  Orientation:  Full (Time, Place, and Person)  Thought Content:  Logical  Suicidal Thoughts:  No  Homicidal Thoughts:  No  Memory:  Recent;   Good  Judgement:  Fair   Insight:  Fair  Psychomotor Activity:  Normal  Concentration:  Concentration: Good  Recall:  [place] of Knowledge:  Fair  Language:  Fair  Akathisia:  No  Handed:  Right  AIMS (if indicated):     Assets:  Communication Skills Desire for Improvement Social Support  ADL's:  Intact  Cognition:  WNL  Sleep:  Treatment Plan Summary: Medication management   Assessment: 58 year old female with history of anxiety presenting with increased anxiety in context of medical issues including respiratory distress.  Patient's anxiety is interfering with her ability to adequately receive treatment.  Patient would benefit from medication to help combat her anxiety.  Recent EKG shows QTC of 481 therefore will increase her Celexa and increase the frequency of the alprazolam administration.  Medications: Alprazolam increased to 0.5 mg p.o. every 4 hours as needed anxiety Celexa increased to 40 mg p.o. every morning first dose tomorrow  Disposition: No evidence of imminent risk to self or others at present.   Patient does not meet criteria for psychiatric inpatient admission. Supportive therapy provided about ongoing stressors.  Dixie Dials, MD 06/06/2019 6:19 PM

## 2019-06-06 NOTE — Progress Notes (Signed)
Progress Note  Patient Name: Julie Bender Date of Encounter: 06/06/2019  Primary Cardiologist: Ida Rogue, MD   Subjective   No reported racing heart rate or palpitations.  Notes occasional chest pain, attributed to soreness from coughing prior to admission. Still reports some shortness of breath.  States her shortness of breath has improved since admission.  Inpatient Medications    Scheduled Meds: . aspirin EC  81 mg Oral Daily  . atorvastatin  40 mg Oral q1800  . benzonatate  200 mg Oral TID  . chlorpheniramine-HYDROcodone  5 mL Oral Q6H  . citalopram  20 mg Oral Daily  . diltiazem  60 mg Oral Q8H  . enoxaparin (LOVENOX) injection  40 mg Subcutaneous Q24H  . furosemide  40 mg Oral Daily  . insulin aspart  0-15 Units Subcutaneous TID AC & HS  . insulin aspart  12 Units Subcutaneous TID WC  . [START ON 06/07/2019] insulin detemir  42 Units Subcutaneous Daily  . ipratropium-albuterol  3 mL Inhalation Q6H  . [START ON 06/07/2019] methylPREDNISolone (SOLU-MEDROL) injection  40 mg Intravenous Daily  . metoprolol tartrate  25 mg Oral BID  . pantoprazole  40 mg Oral BID AC  . polyethylene glycol  17 g Oral Daily  . sodium chloride flush  3 mL Intravenous Q12H  . zolpidem  5 mg Oral QHS   Continuous Infusions: . magnesium sulfate bolus IVPB Stopped (06/05/19 1041)   PRN Meds: acetaminophen **OR** acetaminophen, albuterol, ALPRAZolam, bisacodyl, guaiFENesin-dextromethorphan, hydrALAZINE, ondansetron **OR** ondansetron (ZOFRAN) IV, oxyCODONE, sodium chloride flush, sodium phosphate   Vital Signs    Vitals:   06/06/19 0612 06/06/19 0722 06/06/19 0925 06/06/19 1503  BP: (!) 159/68  (!) 160/86 (!) 160/75  Pulse: 63  92 60  Resp:   20 (!) 21  Temp:   97.8 F (36.6 C) 98.6 F (37 C)  TempSrc:      SpO2: 99% 92% 97% 95%  Weight:      Height:        Intake/Output Summary (Last 24 hours) at 06/06/2019 1649 Last data filed at 06/06/2019 1018 Gross per 24 hour   Intake 360 ml  Output -  Net 360 ml   Last 3 Weights 06/03/2019 06/02/2019 06/02/2019  Weight (lbs) 217 lb 6 oz 216 lb 11.4 oz 216 lb 7.9 oz  Weight (kg) 98.6 kg 98.3 kg 98.2 kg      Telemetry    SR, atrial pacing. Intermittent ectopy. Brief atrial runs. - Personally Reviewed  ECG    No new tracings - Personally Reviewed  Physical Exam   GEN: No acute distress.  Sitting in chair, eating lunch Neck:  JVD difficult to assess due to body habitus Cardiac: RRR, 1/6 systolic murmur, No rubs or gallops.  Respiratory: Clear to auscultation bilaterally. GI: Obese, nontender, non-distended  MS: No edema; No deformity. Neuro:  Nonfocal  Psych: Normal affect   Labs    High Sensitivity Troponin:   Recent Labs  Lab 05/19/19 0835 05/19/19 1146 06/01/19 1814 06/01/19 1919  TROPONINIHS 90* 82* 56* 56*      Cardiac EnzymesNo results for input(s): TROPONINI in the last 168 hours. No results for input(s): TROPIPOC in the last 168 hours.   Chemistry Recent Labs  Lab 06/01/19 1814  06/04/19 0441 06/05/19 0435 06/06/19 0355  NA 139   < > 135 131* 137  K 4.3   < > 4.5 4.7 4.5  CL 106   < > 102 100 101  CO2  27   < > 25 24 25   GLUCOSE 109*   < > 235* 288* 160*  BUN 21*   < > 19 34* 34*  CREATININE 1.16*   < > 0.88 1.27* 0.81  CALCIUM 8.6*   < > 9.2 9.4 9.7  PROT 6.6  --   --   --   --   ALBUMIN 3.5  --   --   --   --   AST 15  --   --   --   --   ALT 11  --   --   --   --   ALKPHOS 66  --   --   --   --   BILITOT 0.5  --   --   --   --   GFRNONAA 51*   < > >60 46* >60  GFRAA 59*   < > >60 53* >60  ANIONGAP 6   < > 8 7 11    < > = values in this interval not displayed.     Hematology Recent Labs  Lab 06/01/19 1814 06/02/19 0449  WBC 8.4 7.3  RBC 3.77* 3.82*  HGB 10.1* 10.2*  HCT 32.2* 32.5*  MCV 85.4 85.1  MCH 26.8 26.7  MCHC 31.4 31.4  RDW 15.3 15.4  PLT 320 301    BNP Recent Labs  Lab 06/01/19 1814  BNP 358.0*     DDimer No results for input(s):  DDIMER in the last 168 hours.   Radiology    No results found.  Cardiac Studies   Echocardiogram (05/21/2019): 1. Left ventricular ejection fraction, by visual estimation, is >75%. The left ventricle has hyperdynamic function. There is severely increased left ventricular hypertrophy. 2. Definity contrast agent was given IV to delineate the left ventricular endocardial borders. 3. Elevated mean left atrial pressure. 4. Hypertrophic cardiomyopathy. 5. Left ventricular diastolic Doppler parameters are consistent with pseudonormalization pattern of LV diastolic filling. 6. Global right ventricle was not well visualized.The right ventricular size is not well visualized. No increase in right ventricular wall thickness. 7. Left atrial size was mildly dilated. 8. Right atrial size was not well visualized. 9. The pericardium was not well visualized. 10. Mild aortic valve annular calcification. 11. The mitral valve is degenerative. Mild mitral valve regurgitation. 12. The tricuspid valve is not well visualized. Tricuspid valve regurgitation was not visualized by color flow Doppler. 13. The aortic valve was not well visualized Aortic valve regurgitation is trivial by color flow Doppler. Mild aortic valve sclerosis without stenosis. 14. The pulmonic valve was not well visualized. Pulmonic valve regurgitation is not visualized by color flow Doppler. 15. The inferior vena cava is normal in size with greater than 50% respiratory variability, suggesting right atrial pressure of 3 mmHg. 16. The interatrial septum was not well visualized.   Patient Profile     58 y.o. female with a history of hypertrophic cardiomyopathy s/p ICD, chronic HFpEF, and COPD, admitted with recurrent shortness of breath.  Assessment & Plan    Shortness of breath -Symptoms likely multifactorial and including infection (diagnosis hospitalization) HFpEF, COPD.  Improved SOB today. --Appears euvolemic on exam, though  assessment of volume status challenging due to body habitus.  --Continue to monitor I's/O's, daily weights.  Daily BMET with repletion of electrolytes as needed. --Continue oral furosemide 40 mg daily.  Renal function appears to have improved with yesterday's decrease in diuresis. Continue increased metoprolol tartrate 25 mg twice daily with diltiazem 60 mg every 8h. Once  patient recovers from respiratory infection, R/LHC, preferably as an outpatient, can be considered for further workup as below.  HOCM and acute on chronic HFpEF --Intermittent/continued shortness of breath still reported.  Echo 10/5 as above with EF >75%. Appears euvolemic on exam as noted above; however, exam is challenging in the setting of her obesity.  Previous studies show mild LVOT gradient, low output by catheterization at [facility] (2018). Possible that HOCM / low output may be contributing to her sx. Medication recommendations as above and including lasix, metoprolol tartrate, and diltiazem. Titrate medications for optimal BP and HR control. R/LHC to be determined once patient recovers from rhinovirus infection.   Nonobstructive coronary artery disease --2018 cath at [facility] showed minimal CAD.  As previously noted, it is not felt that her symptoms are primarily due to coronary insufficiency.  Continue medical management with aspirin and statin therapy.  Continue beta-blocker and diltiazem as above.  Recommendation is for upcoming cardiac catheterization in the near future, pending recovery of her illness.  Cardiac catheterization can be deferred to the outpatient setting and once the patient is recovered.  For questions or updates, please contact [place] Please consult [email or web address] for contact info under        Signed, Arvil Chaco, PA-C  06/06/2019, 4:49 PM

## 2019-06-06 NOTE — Progress Notes (Signed)
[facility] NAME: Julie Bender    MR#:  [id number]  DATE OF BIRTH:  1961/02/03  SUBJECTIVE:  CHIEF COMPLAINT: Patient is very anxious, out of bed to chair and thinks she is going to stop breathing and die  REVIEW OF SYSTEMS:  CONSTITUTIONAL: No fever, fatigue or weakness.  EYES: No blurred or double vision.  EARS, NOSE, AND THROAT: No tinnitus or ear pain.  RESPIRATORY: Cough is better but reports shortness of breath with exertion,  denies  hemoptysis.  CARDIOVASCULAR: No chest pain, orthopnea, edema.  GASTROINTESTINAL: No nausea, vomiting, diarrhea or abdominal pain.  GENITOURINARY: No dysuria, hematuria.  ENDOCRINE: No polyuria, nocturia,  HEMATOLOGY: No anemia, easy bruising or bleeding SKIN: No rash or lesion. MUSCULOSKELETAL: No joint pain or arthritis.   NEUROLOGIC: No tingling, numbness, weakness.  PSYCHIATRY: Feels very anxious  DRUG ALLERGIES:  No Known Allergies  VITALS:  Blood pressure (!) 160/75, pulse 60, temperature 98.6 F (37 C), resp. rate (!) 21, height 5\' 4"  (1.626 m), weight 98.6 kg, SpO2 95 %.  PHYSICAL EXAMINATION:  GENERAL:  58 y.o.-year-old patient lying in the bed with no acute distress.  EYES: Pupils equal, round, reactive to light and accommodation. No scleral icterus. Extraocular muscles intact.  HEENT: Head atraumatic, normocephalic. Oropharynx and nasopharynx clear.  NECK:  Supple, no jugular venous distention. No thyroid enlargement, no tenderness.  LUNGS: Moderate breath sounds bilaterally with no wheezing, no rales,rhonchi or crepitation. No use of accessory muscles of respiration.  CARDIOVASCULAR: S1, S2 normal. No murmurs, rubs, or gallops.  ABDOMEN: Soft, nontender, nondistended. Bowel sounds present.  Abdominal swelling EXTREMITIES: No pedal edema, cyanosis, or clubbing.  NEUROLOGIC: Cranial nerves II through XII are intact. Muscle strength 5/5 in all extremities. Sensation  intact. Gait not checked.  PSYCHIATRIC: The patient is alert and oriented x 3.  SKIN: No obvious rash, lesion, or ulcer.    LABORATORY PANEL:   CBC Recent Labs  Lab 06/02/19 0449  WBC 7.3  HGB 10.2*  HCT 32.5*  PLT 301   ------------------------------------------------------------------------------------------------------------------  Chemistries  Recent Labs  Lab 06/01/19 1814  06/06/19 0355 06/06/19 1522  NA 139   < > 137  --   K 4.3   < > 4.5  --   CL 106   < > 101  --   CO2 27   < > 25  --   GLUCOSE 109*   < > 160*  --   BUN 21*   < > 34*  --   CREATININE 1.16*   < > 0.81  --   CALCIUM 8.6*   < > 9.7  --   MG  --    < > 2.4 2.3  AST 15  --   --   --   ALT 11  --   --   --   ALKPHOS 66  --   --   --   BILITOT 0.5  --   --   --    < > = values in this interval not displayed.   ------------------------------------------------------------------------------------------------------------------  Cardiac Enzymes No results for input(s): TROPONINI in the last 168 hours. ------------------------------------------------------------------------------------------------------------------  RADIOLOGY:  No results found.  EKG:   Orders placed or performed during the hospital encounter of 06/01/19  . EKG 12-Lead  . EKG 12-Lead  . EKG 12-Lead  . EKG 12-Lead    ASSESSMENT AND PLAN:   #Acute respiratory distress secondary to acute rhinovirus infection,  CHF with hypertensive heart disease, reactive airway disease exacerbation, hypertrophic obstructive cardiomyopathy On Lasix drip which is changed to Lasix 40 mg IV twice daily as patient does not clinically look significantly fluid overloaded and cardiology is not recommending over diuresing the patient -changed to Lasix p.o. monitor daily weights, intake and output Consulted primary cardiology group-[facility], appreciate cardiology.  Considering right heart catheterization once patient is clinically better  recommendations Metoprolol is added to the regimen, to 25 mg Diltiazem 30 mg every 8 hours is added to the regimen, dose increased to 60 mg COVID-19 is negative, repeat Covid test is negative and respiratory panel is positive for rhinovirus  Clinically improving  #Acute reactive airway disease-with acute rhinovirus infection  continue IV Solu-Medrol and taper to once daily  given 1 dose of magnesium 2 g IV on 06/03/2019, inhalers and supportive treatment as needed DuoNebs every 6 hours, albuterol as needed Antitussives added to the regimen.  Patient is on Tussionex, Tessalon Perles scheduled and Robitussin as needed Pulmonology consult placed-Dr. Lanney Gins has seen the patient appreciate his recommendations.  Recommending right heart catheterization for full evaluation and treatment Repeat Covid test is negative  #Obstructive sleep apnea Needs CPAP nightly, patient is not wearing consistently  #Nonsustained V. tach 7 beats on 06/05/2019 and PSVT on 70 beats Continue close monitoring EKG no arrhythmias noticed, currently patient is in normal sinus rhythm Potassium is normal check magnesium Cardiology is aware and no interventions recommended at this time  #Anxiety Xanax as needed.  Celexa at home medication is continue Psychiatry consult placed and discussed with Dr. Tula Nakayama  #Small pericardial effusion Small pericardial effusion noticed on the CT chest, groundglass opacities are improving when compared to the previous scan Discussed with  Dr. Fletcher Anon, he will follow up the patient and not suggesting echocardiogram as the effusion is very small  #Constipation Dulcolax suppository, MiraLAX  #Obstructive sleep apnea CPAP nightly  #Diabetes mellitus sliding scale insulin and monitor sugars closely while patient is on steroids NovoLog 12 units 3 times daily with meals Levemir 42 units subcu once daily-her home dose  #Essential hypertension blood pressure is elevated Patient is  on Cardizem 30 mg every 8 hours, metoprolol 25 mg and Lasix Cardizem dose increased to 60 mg every 8 hours  #Insomnia patient is requesting Ambien will provide the same     All the records are reviewed and case discussed with Care Management/Social Workerr. Management plans discussed with the patient, she is in agreement.  CODE STATUS: fc  TOTAL TIME TAKING CARE OF THIS PATIENT: 35 minutes.   POSSIBLE D/C IN 2 DAYS, DEPENDING ON CLINICAL CONDITION.  Note: This dictation was prepared with Dragon dictation along with smaller phrase technology. Any transcriptional errors that result from this process are unintentional.   Nicholes Mango M.D on 06/06/2019 at 4:04 PM  Between 7am to 6pm - Pager - [phone] After 6pm go to [email or web address] - password [email or web address] [facility]  [facility]  Office  [phone]  CC: Primary care physician; [facility], [place]

## 2019-06-06 NOTE — Progress Notes (Signed)
Ambien discontinue per Dr. Lanney Gins.  Clarise Cruz, BSN

## 2019-06-06 NOTE — Progress Notes (Signed)
Pulmonary Medicine          Date: 06/06/2019,   MRN# [id number] Julie Bender April 21, 1961     AdmissionWeight: 96.2 kg                 CurrentWeight: 98.6 kg      CHIEF COMPLAINT:   Severe shortness of breath    SUBJECTIVE   Clinically improved.  LRTI due to RHINOVIRUS+   Still coughing , reports chest pain from forceful cough [id number]  PAST MEDICAL HISTORY   Past Medical History:  Diagnosis Date   CHF (congestive heart failure) (HCC)    COPD (chronic obstructive pulmonary disease) (HCC)    Diabetes mellitus without complication ([place])    Diabetic retinopathy ([place])    History of placement of internal cardiac defibrillator    Hypertension    Hypertrophic cardiomyopathy ([place])      SURGICAL HISTORY   Past Surgical History:  Procedure Laterality Date   CARDIAC DEFIBRILLATOR PLACEMENT     CHOLECYSTECTOMY       FAMILY HISTORY   Family History  Problem Relation Age of Onset   Hypertrophic cardiomyopathy Sister      SOCIAL HISTORY   Social History   Tobacco Use   Smoking status: Passive Smoke Exposure - Never Smoker   Smokeless tobacco: Never Used  Substance Use Topics   Alcohol use: No   Drug use: Never     MEDICATIONS    Home Medication:    Current Medication:  Current Facility-Administered Medications:    acetaminophen (TYLENOL) tablet 650 mg, 650 mg, Oral, Q6H PRN, 650 mg at 06/05/19 0519 **OR** acetaminophen (TYLENOL) suppository 650 mg, 650 mg, Rectal, Q6H PRN, Lance Coon, MD   albuterol (PROVENTIL) (2.5 MG/3ML) 0.083% nebulizer solution 2.5 mg, 2.5 mg, Nebulization, Q2H PRN, Gouru, Aruna, MD   ALPRAZolam Duanne Moron) tablet 0.5 mg, 0.5 mg, Oral, TID PRN, Gouru, Aruna, MD, 0.5 mg at 06/06/19 0953   aspirin EC tablet 81 mg, 81 mg, Oral, Daily, Lance Coon, MD, 81 mg at 06/06/19 0920   atorvastatin (LIPITOR) tablet 40 mg, 40 mg, Oral, q1800, Lance Coon, MD, 40 mg at 06/05/19 1757   benzonatate (TESSALON)  capsule 200 mg, 200 mg, Oral, TID, Gouru, Aruna, MD, 200 mg at 06/06/19 0920   bisacodyl (DULCOLAX) suppository 10 mg, 10 mg, Rectal, Daily PRN, Gouru, Aruna, MD, 10 mg at 06/03/19 1711   chlorpheniramine-HYDROcodone (TUSSIONEX) 10-8 MG/5ML suspension 5 mL, 5 mL, Oral, Q6H, Aerianna Losey, MD, 5 mL at 06/06/19 1202   citalopram (CELEXA) tablet 20 mg, 20 mg, Oral, Daily, Lance Coon, MD, 20 mg at 06/06/19 0921   diltiazem (CARDIZEM) tablet 30 mg, 30 mg, Oral, Q8H, End, Christopher, MD, 30 mg at 06/06/19 0609   enoxaparin (LOVENOX) injection 40 mg, 40 mg, Subcutaneous, Q24H, Lance Coon, MD, 40 mg at 06/06/19 [id number]   furosemide (LASIX) tablet 40 mg, 40 mg, Oral, Daily, End, Christopher, MD, 40 mg at 06/06/19 0920   guaiFENesin-dextromethorphan (ROBITUSSIN DM) 100-10 MG/5ML syrup 10 mL, 10 mL, Oral, Q4H PRN, Gouru, Aruna, MD, 10 mL at 06/06/19 0052   hydrALAZINE (APRESOLINE) injection 10 mg, 10 mg, Intravenous, Q4H PRN, Lance Coon, MD, 10 mg at 06/05/19 2043   insulin aspart (novoLOG) injection 0-15 Units, 0-15 Units, Subcutaneous, TID AC & HS, Lance Coon, MD, 11 Units at 06/06/19 1202   insulin aspart (novoLOG) injection 6 Units, 6 Units, Subcutaneous, TID WC, Gouru, Aruna, MD, 6 Units at 06/06/19 1201   insulin detemir (LEVEMIR)  injection 40 Units, 40 Units, Subcutaneous, Daily, Gouru, Aruna, MD, 40 Units at 06/06/19 0923   ipratropium-albuterol (DUONEB) 0.5-2.5 (3) MG/3ML nebulizer solution 3 mL, 3 mL, Inhalation, Q6H, Gouru, Aruna, MD   magnesium sulfate IVPB 2 g 50 mL, 2 g, Intravenous, Once, Gouru, Aruna, MD, Stopped at 06/05/19 1041   methylPREDNISolone sodium succinate (SOLU-MEDROL) 40 mg/mL injection 40 mg, 40 mg, Intravenous, Q12H, Gouru, Aruna, MD, 40 mg at 06/06/19 0921   metoprolol tartrate (LOPRESSOR) tablet 25 mg, 25 mg, Oral, BID, End, Harrell Gave, MD, 25 mg at 06/06/19 0920   ondansetron (ZOFRAN) tablet 4 mg, 4 mg, Oral, Q6H PRN **OR** ondansetron (ZOFRAN)  injection 4 mg, 4 mg, Intravenous, Q6H PRN, Lance Coon, MD   oxyCODONE (Oxy IR/ROXICODONE) immediate release tablet 5 mg, 5 mg, Oral, Q6H PRN, Lance Coon, MD, 5 mg at 06/05/19 2043   pantoprazole (PROTONIX) EC tablet 40 mg, 40 mg, Oral, BID AC, Lance Coon, MD, 40 mg at 06/06/19 0920   polyethylene glycol (MIRALAX / GLYCOLAX) packet 17 g, 17 g, Oral, Daily, Gouru, Aruna, MD, 17 g at 06/06/19 0921   sodium chloride flush (NS) 0.9 % injection 3 mL, 3 mL, Intravenous, Q12H, Gouru, Aruna, MD, 3 mL at 06/06/19 [id number]   sodium chloride flush (NS) 0.9 % injection 3 mL, 3 mL, Intravenous, PRN, Gouru, Aruna, MD, 3 mL at 06/03/19 1705   sodium phosphate (FLEET) 7-19 GM/118ML enema 1 enema, 1 enema, Rectal, Daily PRN, Gouru, Aruna, MD   zolpidem (AMBIEN) tablet 5 mg, 5 mg, Oral, QHS, Gouru, Aruna, MD, 5 mg at 06/05/19 2116    ALLERGIES   Patient has no known allergies.     REVIEW OF SYSTEMS    Review of Systems:  Gen:  Denies  fever, sweats, chills weigh loss  HEENT: Denies blurred vision, double vision, ear pain, eye pain, hearing loss, nose bleeds, sore throat Cardiac:  No dizziness, chest pain or heaviness, chest tightness,edema Resp:   Denies cough or sputum porduction, shortness of breath,wheezing, hemoptysis,  Gi: Denies swallowing difficulty, stomach pain, nausea or vomiting, diarrhea, constipation, bowel incontinence Gu:  Denies bladder incontinence, burning urine Ext:   Denies Joint pain, stiffness or swelling Skin: Denies  skin rash, easy bruising or bleeding or hives Endoc:  Denies polyuria, polydipsia , polyphagia or weight change Psych:   Denies depression, insomnia or hallucinations   Other:  All other systems negative   VS: BP (!) 160/86 (BP Location: Left Arm)    Pulse 92    Temp 97.8 F (36.6 C)    Resp 20    Ht 5\' 4"  (1.626 m)    Wt 98.6 kg    SpO2 97%    BMI 37.31 kg/m      PHYSICAL EXAM    GENERAL:NAD, no fevers, chills, no weakness no  fatigue HEAD: Normocephalic, atraumatic.  EYES: Pupils equal, round, reactive to light. Extraocular muscles intact. No scleral icterus.  MOUTH: Moist mucosal membrane. Dentition intact. No abscess noted.  EAR, NOSE, THROAT: Clear without exudates. No external lesions.  NECK: Supple. No thyromegaly. No nodules. No JVD.  PULMONARY: mild rhonchi with no crackles CARDIOVASCULAR: S1 and S2. Regular rate and rhythm. No murmurs, rubs, or gallops. No edema. Pedal pulses 2+ bilaterally.  GASTROINTESTINAL: Soft, nontender, nondistended. No masses. Positive bowel sounds. No hepatosplenomegaly.  MUSCULOSKELETAL: No swelling, clubbing, or edema. Range of motion full in all extremities.  NEUROLOGIC: Cranial nerves II through XII are intact. No gross focal neurological deficits. Sensation intact. Reflexes intact.  SKIN: No ulceration, lesions, rashes, or cyanosis. Skin warm and dry. Turgor intact.  PSYCHIATRIC: Mood, affect within normal limits. The patient is awake, alert and oriented x 3. Insight, judgment intact.       IMAGING    Dg Chest 2 View  Result Date: 05/19/2019 CLINICAL DATA:  Bilateral lower extremity pain. Shortness of breath. EXAM: CHEST - 2 VIEW COMPARISON:  02/23/2019 FINDINGS: There is mild bilateral interstitial thickening. There is no focal consolidation. There is no pleural effusion or pneumothorax. There is stable cardiomegaly. There is a dual lead cardiac pacemaker. There is no acute osseous abnormality. IMPRESSION: Cardiomegaly with mild pulmonary vascular congestion. Electronically Signed   By: Kathreen Devoid   On: 05/19/2019 09:22   Ct Chest Wo Contrast  Result Date: 06/04/2019 CLINICAL DATA:  Cough and shortness of breath. EXAM: CT CHEST WITHOUT CONTRAST TECHNIQUE: Multidetector CT imaging of the chest was performed following the standard protocol without IV contrast. COMPARISON:  05/19/2019 FINDINGS: Cardiovascular: Intracardiac AICD leads are unchanged. The heart remains mildly  enlarged. Interval small pericardial effusion with a maximum thickness of 7 mm. The main pulmonary artery remains dilated with a transverse diameter of 3.8 cm. Mediastinum/Nodes: Diffusely enlarged and minimally heterogeneous thyroid gland with a curvilinear calcification in the left lobe. No enlarged lymph nodes. Lungs/Pleura: Multiple small bilateral calcified granulomata are again demonstrated. No pleural fluid. Mild linear atelectasis/scarring in the lingula with improvement. Mild bilateral bullous changes. Upper Abdomen: Cholecystectomy clips. Musculoskeletal: Mild thoracic spine degenerative changes. IMPRESSION: 1. Interval small pericardial effusion with a maximum thickness of 7 mm. 2. Stable enlarged main pulmonary artery, possibly due to pulmonary arterial hypertension. 3. Stable mild cardiomegaly and mild changes of COPD. Emphysema (ICD10-J43.9). Electronically Signed   By: Claudie Revering M.D.   On: 06/04/2019 09:42   Ct Angio Chest Pe W And/or Wo Contrast  Result Date: 05/19/2019 CLINICAL DATA:  Bilateral lower extremity pain and shortness of breath. EXAM: CT ANGIOGRAPHY CHEST CT ABDOMEN AND PELVIS WITH CONTRAST TECHNIQUE: Multidetector CT imaging of the chest was performed using the standard protocol during bolus administration of intravenous contrast. Multiplanar CT image reconstructions and MIPs were obtained to evaluate the vascular anatomy. Multidetector CT imaging of the abdomen and pelvis was performed using the standard protocol during bolus administration of intravenous contrast. CONTRAST:  158mL OMNIPAQUE IOHEXOL 350 MG/ML SOLN COMPARISON:  CT dated 04/18/2018 FINDINGS: CTA CHEST FINDINGS Cardiovascular: Contrast injection is sufficient to demonstrate satisfactory opacification of the pulmonary arteries to the segmental level. There is no pulmonary embolus. The main pulmonary artery is dilated measuring approximately 3.8 cm in diameter. There is no CT evidence of acute right heart strain.  There are mild atherosclerotic changes of the visualized thoracic aorta. Heart size is enlarged. There is significant wall thickening of the left ventricle. 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 There is no significant pericardial effusion. Mediastinum/Nodes: --No mediastinal or hilar lymphadenopathy. --No axillary lymphadenopathy. --No supraclavicular lymphadenopathy. --a large left-sided thyroid nodule is again noted. --The esophagus is unremarkable Lungs/Pleura: The lung volumes are low. The trachea is unremarkable. There is a trace right-sided pleural effusion. There is no pneumothorax or focal infiltrate. There is a small amount of atelectasis in the lingula. Musculoskeletal: No chest wall abnormality. No acute or significant osseous findings. Review of the MIP images confirms the above findings. CT ABDOMEN and PELVIS FINDINGS Hepatobiliary: The liver is normal. Status post cholecystectomy.There is no biliary ductal dilation. Pancreas: Normal contours without ductal dilatation. No peripancreatic fluid collection. Spleen: No  splenic laceration or hematoma. Adrenals/Urinary Tract: --Adrenal glands: No adrenal hemorrhage. --Right kidney/ureter: No hydronephrosis or perinephric hematoma. --Left kidney/ureter: No hydronephrosis or perinephric hematoma. --Urinary bladder: Unremarkable. Stomach/Bowel: --Stomach/Duodenum: No hiatal hernia or other gastric abnormality. Normal duodenal course and caliber. --Small bowel: No dilatation or inflammation. --Colon: No focal abnormality. --Appendix: Normal. Vascular/Lymphatic: Atherosclerotic calcification is present within the non-aneurysmal abdominal aorta, without hemodynamically significant stenosis. --No retroperitoneal lymphadenopathy. --No mesenteric lymphadenopathy. --No pelvic or inguinal lymphadenopathy. Reproductive: Unremarkable Other: There is a partially visualized 1.7 x 1.2 cm subcutaneous nodule in the left gluteal fold. The abdominal wall is normal.  Musculoskeletal. No acute displaced fractures. Review of the MIP images confirms the above findings. IMPRESSION: 1. No acute thoracic, abdominal or pelvic injury. 2. Cardiomegaly with significant wall thickening of the left ventricle. 3. Trace right-sided pleural effusion. 4. Dilated main pulmonary artery which can be seen in patients with elevated pulmonary artery pressures. 5. Partially visualized 1.7 x 1.2 cm subcutaneous nodule in the left gluteal fold. Recommend correlation with physical examination. This could represent a sebaceous cyst. Aortic Atherosclerosis (ICD10-I70.0). Electronically Signed   By: Constance Holster M.D.   On: 05/19/2019 11:06   Ct Abdomen Pelvis W Contrast  Result Date: 05/19/2019 CLINICAL DATA:  Bilateral lower extremity pain and shortness of breath. EXAM: CT ANGIOGRAPHY CHEST CT ABDOMEN AND PELVIS WITH CONTRAST TECHNIQUE: Multidetector CT imaging of the chest was performed using the standard protocol during bolus administration of intravenous contrast. Multiplanar CT image reconstructions and MIPs were obtained to evaluate the vascular anatomy. Multidetector CT imaging of the abdomen and pelvis was performed using the standard protocol during bolus administration of intravenous contrast. CONTRAST:  135mL OMNIPAQUE IOHEXOL 350 MG/ML SOLN COMPARISON:  CT dated 04/18/2018 FINDINGS: CTA CHEST FINDINGS Cardiovascular: Contrast injection is sufficient to demonstrate satisfactory opacification of the pulmonary arteries to the segmental level. There is no pulmonary embolus. The main pulmonary artery is dilated measuring approximately 3.8 cm in diameter. There is no CT evidence of acute right heart strain. There are mild atherosclerotic changes of the visualized thoracic aorta. Heart size is enlarged. There is significant wall thickening of the left ventricle. 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 There is no significant pericardial effusion. Mediastinum/Nodes: --No mediastinal or hilar  lymphadenopathy. --No axillary lymphadenopathy. --No supraclavicular lymphadenopathy. --a large left-sided thyroid nodule is again noted. --The esophagus is unremarkable Lungs/Pleura: The lung volumes are low. The trachea is unremarkable. There is a trace right-sided pleural effusion. There is no pneumothorax or focal infiltrate. There is a small amount of atelectasis in the lingula. Musculoskeletal: No chest wall abnormality. No acute or significant osseous findings. Review of the MIP images confirms the above findings. CT ABDOMEN and PELVIS FINDINGS Hepatobiliary: The liver is normal. Status post cholecystectomy.There is no biliary ductal dilation. Pancreas: Normal contours without ductal dilatation. No peripancreatic fluid collection. Spleen: No splenic laceration or hematoma. Adrenals/Urinary Tract: --Adrenal glands: No adrenal hemorrhage. --Right kidney/ureter: No hydronephrosis or perinephric hematoma. --Left kidney/ureter: No hydronephrosis or perinephric hematoma. --Urinary bladder: Unremarkable. Stomach/Bowel: --Stomach/Duodenum: No hiatal hernia or other gastric abnormality. Normal duodenal course and caliber. --Small bowel: No dilatation or inflammation. --Colon: No focal abnormality. --Appendix: Normal. Vascular/Lymphatic: Atherosclerotic calcification is present within the non-aneurysmal abdominal aorta, without hemodynamically significant stenosis. --No retroperitoneal lymphadenopathy. --No mesenteric lymphadenopathy. --No pelvic or inguinal lymphadenopathy. Reproductive: Unremarkable Other: There is a partially visualized 1.7 x 1.2 cm subcutaneous nodule in the left gluteal fold. The abdominal wall is normal. Musculoskeletal. No acute displaced fractures. Review of  the MIP images confirms the above findings. IMPRESSION: 1. No acute thoracic, abdominal or pelvic injury. 2. Cardiomegaly with significant wall thickening of the left ventricle. 3. Trace right-sided pleural effusion. 4. Dilated main  pulmonary artery which can be seen in patients with elevated pulmonary artery pressures. 5. Partially visualized 1.7 x 1.2 cm subcutaneous nodule in the left gluteal fold. Recommend correlation with physical examination. This could represent a sebaceous cyst. Aortic Atherosclerosis (ICD10-I70.0). Electronically Signed   By: Constance Holster M.D.   On: 05/19/2019 11:06   Dg Chest Portable 1 View  Result Date: 06/01/2019 CLINICAL DATA:  Cough, body aches and fatigue for 5 days EXAM: PORTABLE CHEST 1 VIEW COMPARISON:  Radiograph 05/25/2019, CT angio 05/19/2019 FINDINGS: Hazy interstitial opacities throughout the lungs with a basilar predominance are mild central venous congestion with cardiomegaly which is similar to comparison exam. Dual lead pacer pack overlies the left chest wall with the lead at the cardiac apex and right atrium. No pneumothorax. No visible effusion. No acute osseous or soft tissue abnormality. IMPRESSION: 1. Unchanged bilateral diffuse interstitial opacities and central venous congestion likely reflecting some interstitial edema. 2. Stable cardiomegaly. Electronically Signed   By: Lovena Le M.D.   On: 06/01/2019 17:04   Dg Chest Port 1 View  Result Date: 05/25/2019 CLINICAL DATA:  Shortness of breath on exertion EXAM: PORTABLE CHEST 1 VIEW COMPARISON:  Chest radiograph dated 05/19/2019 and CT chest dated 05/19/2019. FINDINGS: A left subclavian approach cardiac device is redemonstrated. A 2.3 cm long radiopacity overlies the cardiac silhouette and may be external to the patient. The heart remains enlarged. Vascular calcifications are seen in the aortic arch. There is a mild diffuse bilateral interstitial opacities which are not significantly changed. There is no pleural effusion or pneumothorax. IMPRESSION: 1. No significant interval change in the mild diffuse interstitial opacities. 2. A 2.3 cm long radiopacity overlies the cardiac silhouette and may be external to the patient. 3.  Stable cardiomegaly. Electronically Signed   By: Zerita Boers M.D.   On: 05/25/2019 11:00             ASSESSMENT/PLAN    SOB and cough due to Rhinovirus infection  Complicated by hypertrophic cardiomyopathy and PAH  - reviewed serial CT chest - diffuse GGO is improved from previous done 2 weeks ago  - she does feel relief from albuterol  - patient endorses some symptoms of anxiety -PH is likely pre and post capillary Group 2 and 1 - she is currently NYHA/WHO 3-4 - would recommend RHC for full evaluation and tx , can be done on outpatient -patient is a never smoker   Obstructive sleep apnea diagnostic sleep study 10/02/2017>>  PLM index of 132, PLM arousal index of 13.7.  Mild sleep apnea with an AHI of 7.1.  CPAP titration study was recommended as well as work-up for periodic limb movement disorder. CPAP titration study 12/05/2017>> PLM index 65, PLM arousal index of 2.  Hypopnea index remained elevated above 10 and all pressures until a pressure of 10 was achieved and AHI was 0. CPAP of 10 was recommended.  -patient did not wear consistently.        Thank you for allowing me to participate in the care of this patient.    Patient/Family are satisfied with care plan and all questions have been answered.  This document was prepared using Dragon voice recognition software and may include unintentional dictation errors.     Ottie Glazier, M.D.  Division of Pulmonary &  [place]

## 2019-06-06 NOTE — Progress Notes (Addendum)
Inpatient Diabetes Program Recommendations  AACE/ADA: New Consensus Statement on Inpatient Glycemic Control (2015)  Target Ranges:  Prepandial:   less than 140 mg/dL      Peak postprandial:   less than 180 mg/dL (1-2 hours)      Critically ill patients:  140 - 180 mg/dL   Results for Julie Bender, Julie Bender (MRN [id number]) as of 06/06/2019 11:52  Ref. Range 06/05/2019 07:41 06/05/2019 11:29 06/05/2019 16:47 06/05/2019 16:50 06/05/2019 20:48  Glucose-Capillary Latest Ref Range: 70 - 99 mg/dL 285 (H)  12 units NOVOLOG +  40 units LEVEMIR given at 9am  356 (H)  21 units NOVOLOG  419 (H) 415 (H)  26 units NOVOLOG  366 (H)  15 units NOVOLOG+  7 units LEVEMIR    Results for Julie Bender, Julie Bender (MRN [id number]) as of 06/06/2019 11:52  Ref. Range 06/06/2019 07:54 06/06/2019 11:48  Glucose-Capillary Latest Ref Range: 70 - 99 mg/dL 130 (H)  8 units NOVOLOG +  40 units LEVEMIR 317 (H)    Home DM meds:Levemir 42 units QHS       Novolog 13 units TID with meals       Metformin 850 mg TID   Current Orders: Levemir 40 units Daily      Novolog 0-15 units AC&HS      Novolog 6 units TID with meals     Solumedrol reduced to 40 mg Q12 hours.  Still having significant CBG elevations post meals.     MD- Please consider increasing the Novolog Meal Coverage to:  Novolog 12 units TID with meals  (Please add the following Hold Parameters: Hold if pt eats <50% of meal, Hold if pt NPO)      --Will follow patient during hospitalization--  Wyn Quaker RN, MSN, CDE Diabetes Coordinator Inpatient Glycemic Control Team Team Pager: [phone] (8a-5p)

## 2019-06-06 NOTE — Progress Notes (Signed)
Telemetry called again and reported that patient had a 12 beat run of SVT rate of 178. Messaged Dr. Margaretmary Eddy and Dr. Saunders Revel about same. Dr. Margaretmary Eddy ordered to check Mag.

## 2019-06-07 DIAGNOSIS — R0602 Shortness of breath: Secondary | ICD-10-CM | POA: Diagnosis not present

## 2019-06-07 DIAGNOSIS — R531 Weakness: Secondary | ICD-10-CM

## 2019-06-07 DIAGNOSIS — J4541 Moderate persistent asthma with (acute) exacerbation: Secondary | ICD-10-CM

## 2019-06-07 DIAGNOSIS — G4733 Obstructive sleep apnea (adult) (pediatric): Secondary | ICD-10-CM | POA: Diagnosis not present

## 2019-06-07 LAB — GLUCOSE, CAPILLARY
Glucose-Capillary: 137 mg/dL — ABNORMAL HIGH (ref 70–99)
Glucose-Capillary: 249 mg/dL — ABNORMAL HIGH (ref 70–99)
Glucose-Capillary: 358 mg/dL — ABNORMAL HIGH (ref 70–99)
Glucose-Capillary: 369 mg/dL — ABNORMAL HIGH (ref 70–99)
Glucose-Capillary: 277 mg/dL — ABNORMAL HIGH (ref 70–99)

## 2019-06-07 MED ORDER — TORSEMIDE 20 MG PO TABS
60.0000 mg | ORAL_TABLET | Freq: Every day | ORAL | Status: DC
  Administered 2019-06-08 – 2019-06-09 (×2): 60 mg via ORAL
  Filled 2019-06-07 (×2): qty 3

## 2019-06-07 MED ORDER — PHENOL 1.4 % MT LIQD
1.0000 | OROMUCOSAL | Status: DC | PRN
  Administered 2019-06-07 – 2019-06-09 (×3): 1 via OROMUCOSAL
  Filled 2019-06-07: qty 177

## 2019-06-07 NOTE — TOC Initial Note (Signed)
Transition of Care [facility]) - Initial/Assessment Note    Patient Details  Name: Julie Bender MRN: [id number] Date of Birth: 1961/07/09  Transition of Care [facility]) CM/SW Contact:    Shelbie Hutching, RN Phone Number: 06/07/2019, 3:49 PM  Clinical Narrative:                 Patient admitted with hypertensive heart disease with heart failure.  Patient lives with her sister and her mother in [place].  Patient reports that she is current with her PCP and cardiologist.  Patient is open with [facility] for home health services and services will resume at discharge.  Sharmon Revere with [facility] is aware of admission.  Possible discharge home tomorrow, patient reports not feeling ready for discharge today, still having trouble breathing, she reports feeling like each breath will be her last.  Patient sleeps with a cpap.  Patient has a rollator and shower chair at home.   Patient's sister will provide transportation at discharge.   Expected Discharge Plan: [place] Barriers to Discharge: Continued Medical Work up   Patient Goals and CMS Choice Patient states their goals for this hospitalization and ongoing recovery are:: Wants her breathing to get better - feels like she is going to take her last breath every time she breaths in CMS Medicare.gov Compare Post Acute Care list provided to:: Patient Choice offered to / list presented to : Patient  Expected Discharge Plan and Services Expected Discharge Plan: [place]   Discharge Planning Services: CM Consult Post Acute Care Choice: Resumption of Svcs/PTA Provider Living arrangements for the past 2 months: Single Family Home                           HH Arranged: RN, PT, OT [facility] Agency: [place] Date [place]: 06/07/19 Time HH Agency Contacted: 33 Representative spoke with at [place]: Sharmon Revere  Prior Living Arrangements/Services Living arrangements for the past 2  months: [place] with:: Parents, Siblings Patient language and need for interpreter reviewed:: No Do you feel safe going back to the place where you live?: Yes      Need for Family Participation in Patient Care: Yes (Comment)(CHF) Care giver support system in place?: Yes (comment)(sister and mother) Current home services: DME(shower chair, rollator) Criminal Activity/Legal Involvement Pertinent to Current Situation/Hospitalization: No - Comment as needed  Activities of Daily Living Home Assistive Devices/Equipment: None ADL Screening (condition at time of admission) Patient's cognitive ability adequate to safely complete daily activities?: Yes Is the patient deaf or have difficulty hearing?: No Does the patient have difficulty seeing, even when wearing glasses/contacts?: No Does the patient have difficulty concentrating, remembering, or making decisions?: No Patient able to express need for assistance with ADLs?: Yes Does the patient have difficulty dressing or bathing?: No Independently performs ADLs?: Yes (appropriate for developmental age) Does the patient have difficulty walking or climbing stairs?: No Weakness of Legs: None Weakness of Arms/Hands: None  Permission Sought/Granted Permission sought to share information with : Case Manager, Other (comment) Permission granted to share information with : Yes, Verbal Permission Granted     Permission granted to share info w AGENCY: [facility]        Emotional Assessment Appearance:: Appears stated age Attitude/Demeanor/Rapport: Engaged Affect (typically observed): Accepting Orientation: : Oriented to Self, Oriented to Place, Oriented to  Time, Oriented to Situation Alcohol / Substance Use: Not Applicable  Psych Involvement: No (comment)  Admission diagnosis:  Weakness [R53.1] Idiopathic hypotension [I95.0] Patient Active Problem List   Diagnosis Date Noted  . Generalized anxiety disorder   . Asthma exacerbation  06/01/2019  . Atypical chest pain 05/21/2019  . Chest pain 05/19/2019  . Acute respiratory failure with hypoxia ([place]) 02/23/2019  . Obesity 05/10/2018  . Dyspnea 05/05/2018  . Thyroid nodule 05/05/2018  . OSA (obstructive sleep apnea) 04/30/2018  . Hypertensive heart disease with heart failure ([place]) 04/30/2018  . Acute respiratory distress 04/18/2018  . Chronic right shoulder pain 02/27/2018  . Elevated troponin 05/10/2017  . Bronchitis 09/06/2016  . Demand ischemia ([place]) 09/06/2016  . Hypertrophic cardiomyopathy ([place]) 09/06/2016  . Routine history and physical examination of adult 08/24/2016  . Volume overload 04/20/2016  . Cardiac defibrillator in place 01/30/2014  . Depression 01/30/2014  . Insomnia 11/29/2012  . Essential hypertension 01/20/2011  . Diabetes mellitus ([place]) 10/31/2008   PCP:  Physicians, [place]:   [facility] [place], [place] [address] [place] [address] Phone: [phone] Fax: [place] [phone] - [place], [place] [address] Phone: [phone] Fax: [phone]     Social Determinants of Health (SDOH) Interventions    Readmission Risk Interventions Readmission Risk Prevention Plan 06/02/2019 04/20/2018  Transportation Screening Complete Complete  PCP or Specialist Appt within 5-7 Days - Complete  Home Care Screening - Complete  Medication Review (RN CM) - Complete  Palliative Care Screening Not Applicable -  Medication Review (RN Care Manager) Complete -  Some recent data might be hidden

## 2019-06-07 NOTE — Progress Notes (Addendum)
[facility] [place] NAME: Julie Bender    MR#:  [id number]  DATE OF BIRTH:  [birth year]  SUBJECTIVE:  Patient seen and evaluated today Has generalized weakness Complaints of shortness of breath on exertion Or chest pain   REVIEW OF SYSTEMS:  CONSTITUTIONAL: No fever, has fatigue and weakness.  EYES: No blurred or double vision.  EARS, NOSE, AND THROAT: No tinnitus or ear pain.  RESPIRATORY: Cough is better but reports shortness of breath with exertion,  denies  hemoptysis.  CARDIOVASCULAR: No chest pain, orthopnea, edema.  GASTROINTESTINAL: No nausea, vomiting, diarrhea or abdominal pain.  GENITOURINARY: No dysuria, hematuria.  ENDOCRINE: No polyuria, nocturia,  HEMATOLOGY: No anemia, easy bruising or bleeding SKIN: No rash or lesion. MUSCULOSKELETAL: No joint pain or arthritis.   NEUROLOGIC: No tingling, numbness, weakness.  PSYCHIATRY: Feels very anxious  DRUG ALLERGIES:  No Known Allergies  VITALS:  Blood pressure 134/76, pulse (!) 55, temperature (!) 97.5 F (36.4 C), temperature source Oral, resp. rate 20, height 5\' 4"  (1.626 m), weight 98.6 kg, SpO2 99 %.  PHYSICAL EXAMINATION:  GENERAL:  58 y.o.-year-old patient lying in the bed with no acute distress.  EYES: Pupils equal, round, reactive to light and accommodation. No scleral icterus. Extraocular muscles intact.  HEENT: Head atraumatic, normocephalic. Oropharynx and nasopharynx clear.  NECK:  Supple, no jugular venous distention. No thyroid enlargement, no tenderness.  LUNGS: Moderate breath sounds bilaterally with no wheezing, no rales,rhonchi or crepitation. No use of accessory muscles of respiration.  CARDIOVASCULAR: S1, S2 normal. No murmurs, rubs, or gallops.  ABDOMEN: Soft, nontender, nondistended. Bowel sounds present.  Abdominal swelling EXTREMITIES: No pedal edema, cyanosis, or clubbing.  NEUROLOGIC: Cranial nerves II through XII are intact. Muscle  strength 5/5 in all extremities. Sensation intact. Gait not checked.  PSYCHIATRIC: The patient is alert and oriented x 3.  SKIN: No obvious rash, lesion, or ulcer.    LABORATORY PANEL:   CBC Recent Labs  Lab 06/02/19 0449  WBC 7.3  HGB 10.2*  HCT 32.5*  PLT 301   ------------------------------------------------------------------------------------------------------------------  Chemistries  Recent Labs  Lab 06/01/19 1814  06/06/19 0355 06/06/19 1522  NA 139   < > 137  --   K 4.3   < > 4.5  --   CL 106   < > 101  --   CO2 27   < > 25  --   GLUCOSE 109*   < > 160*  --   BUN 21*   < > 34*  --   CREATININE 1.16*   < > 0.81  --   CALCIUM 8.6*   < > 9.7  --   MG  --    < > 2.4 2.3  AST 15  --   --   --   ALT 11  --   --   --   ALKPHOS 66  --   --   --   BILITOT 0.5  --   --   --    < > = values in this interval not displayed.   ------------------------------------------------------------------------------------------------------------------  Cardiac Enzymes No results for input(s): TROPONINI in the last 168 hours. ------------------------------------------------------------------------------------------------------------------  RADIOLOGY:  No results found.  EKG:   Orders placed or performed during the hospital encounter of 06/01/19  . EKG 12-Lead  . EKG 12-Lead  . EKG 12-Lead  . EKG 12-Lead    ASSESSMENT AND PLAN:   #Acute respiratory distress secondary to acute  rhinovirus infection, CHF with hypertensive heart disease, reactive airway disease exacerbation, hypertrophic obstructive cardiomyopathy Of the Lasix drip Currently on p.o. Lasix monitor daily weights, intake and output Consulted primary cardiology group-[facility], appreciate cardiology.  Considering right heart catheterization once patient is clinically better, follow-up with cardiology Metoprolol 25mg  bid Diltiazem 60 mg every 8 hours is added to the regimen COVID-19 is negative, repeat Covid test is  negative and respiratory panel is positive for rhinovirus  Clinically improving slowly  #Acute reactive airway disease-with acute rhinovirus infection  Switch to oral steroids from IV Solu-Medrol  given 1 dose of magnesium 2 g IV on 06/03/2019, inhalers and supportive treatment as needed DuoNebs every 6 hours, albuterol as needed Antitussives added to the regimen.  Patient is on Tussionex, Tessalon Perles scheduled and Robitussin as needed Pulmonology consult placed-Dr. Lanney Gins has seen the patient appreciate his recommendations.  Recommending right heart catheterization for full evaluation and treatment Repeat Covid test is negative  #Obstructive sleep apnea Needs CPAP nightly, patient is not wearing consistently Discussed compliance with the patient  #Nonsustained V. tach 7 beats on 06/05/2019 and PSVT on 70 beats Continue close monitoring EKG no arrhythmias noticed, currently patient is in normal sinus rhythm Potassium is normal check magnesium Cardiology is aware and no interventions recommended at this time  #Anxiety Xanax as needed.  Celexa at home medication is continue Psychiatry follow-up appreciated and medications changes made  #Small pericardial effusion Small pericardial effusion noticed on the CT chest, groundglass opacities are improving when compared to the previous scan Discussed with  Dr. Fletcher Anon, he will follow up the patient and not suggesting echocardiogram as the effusion is very small  #Constipation Dulcolax suppository, MiraLAX  #Obstructive sleep apnea CPAP nightly  #Diabetes mellitus sliding scale insulin and monitor sugars closely while patient is on steroids NovoLog 12 units 3 times daily with meals Levemir 42 units subcu once daily-her home dose  #Essential hypertension blood pressure is elevated Patient is on Cardizem 30 mg every 8 hours, metoprolol 25 mg and Lasix Cardizem dose increased to 60 mg every 8 hours  #Insomnia patient is requesting  Ambien will provide the same  #Ambulatory dysfunction PT evaluation  All the records are reviewed and case discussed with Care Management/Social Workerr. Management plans discussed with the patient, she is in agreement.  CODE STATUS: Full code  TOTAL TIME TAKING CARE OF THIS PATIENT: 33 minutes.   POSSIBLE D/C IN 2 DAYS, DEPENDING ON CLINICAL CONDITION.  Note: This dictation was prepared with Dragon dictation along with smaller phrase technology. Any transcriptional errors that result from this process are unintentional.   Saundra Shelling M.D on 06/07/2019 at 12:11 PM  Between 7am to 6pm - Pager - [phone] After 6pm go to [email or web address] - password [email or web address] [facility]  [facility]  Office  [phone]  CC: Primary care physician; Phys[facility], [place]

## 2019-06-07 NOTE — Progress Notes (Signed)
Progress Note  Patient Name: Julie Bender Date of Encounter: 06/07/2019  Primary Cardiologist: Ida Rogue, MD  Subjective   Woke up very hoarse.  Notes ongoing dyspnea and wheezing.  Inpatient Medications    Scheduled Meds: . aspirin EC  81 mg Oral Daily  . atorvastatin  40 mg Oral q1800  . benzonatate  200 mg Oral TID  . chlorpheniramine-HYDROcodone  5 mL Oral Q6H  . citalopram  40 mg Oral Daily  . diltiazem  60 mg Oral Q8H  . enoxaparin (LOVENOX) injection  40 mg Subcutaneous Q24H  . furosemide  40 mg Oral Daily  . insulin aspart  0-15 Units Subcutaneous TID AC & HS  . insulin aspart  12 Units Subcutaneous TID WC  . insulin detemir  42 Units Subcutaneous Daily  . ipratropium-albuterol  3 mL Inhalation Q6H  . methylPREDNISolone (SOLU-MEDROL) injection  40 mg Intravenous Daily  . metoprolol tartrate  25 mg Oral BID  . pantoprazole  40 mg Oral BID AC  . polyethylene glycol  17 g Oral Daily  . sodium chloride flush  3 mL Intravenous Q12H   Continuous Infusions: . magnesium sulfate bolus IVPB Stopped (06/05/19 1041)   PRN Meds: acetaminophen **OR** acetaminophen, albuterol, ALPRAZolam, bisacodyl, guaiFENesin-dextromethorphan, hydrALAZINE, ondansetron **OR** ondansetron (ZOFRAN) IV, oxyCODONE, phenol, sodium chloride flush, sodium phosphate   Vital Signs    Vitals:   06/06/19 2209 06/07/19 0146 06/07/19 0541 06/07/19 0955  BP:   (!) 166/94 134/76  Pulse: 65  (!) 55 (!) 55  Resp:   18 20  Temp:   97.8 F (36.6 C) (!) 97.5 F (36.4 C)  TempSrc:   Oral Oral  SpO2: 93% 94% 96% 99%  Weight:      Height:        Intake/Output Summary (Last 24 hours) at 06/07/2019 1154 Last data filed at 06/07/2019 1009 Gross per 24 hour  Intake 120 ml  Output 700 ml  Net -580 ml   Filed Weights   06/02/19 0009 06/02/19 0500 06/03/19 0500  Weight: 98.2 kg 98.3 kg 98.6 kg    Physical Exam   GEN: Hoarse.  Obese, no acute distress.  HEENT: Grossly normal.  Neck:  Supple, obese, difficult to gauge JVP.  No carotid bruits, or masses. Cardiac: RRR, 2/6 systolic murmur heard throughout, no rubs, or gallops. No clubbing, cyanosis, edema.  Radials/DP/PT 2+ and equal bilaterally.  Respiratory:  Respirations regular and unlabored, inspiratory and expiratory wheezing noted in upper airways anteriorly and posteriorly. GI: Obese, soft, nontender, nondistended, BS + x 4. MS: no deformity or atrophy. Skin: warm and dry, no rash. Neuro:  Strength and sensation are intact. Psych: AAOx3.  Normal affect.  Labs    Chemistry Recent Labs  Lab 06/01/19 1814  06/04/19 0441 06/05/19 0435 06/06/19 0355  NA 139   < > 135 131* 137  K 4.3   < > 4.5 4.7 4.5  CL 106   < > 102 100 101  CO2 27   < > 25 24 25   GLUCOSE 109*   < > 235* 288* 160*  BUN 21*   < > 19 34* 34*  CREATININE 1.16*   < > 0.88 1.27* 0.81  CALCIUM 8.6*   < > 9.2 9.4 9.7  PROT 6.6  --   --   --   --   ALBUMIN 3.5  --   --   --   --   AST 15  --   --   --   --  ALT 11  --   --   --   --   ALKPHOS 66  --   --   --   --   BILITOT 0.5  --   --   --   --   GFRNONAA 51*   < > >60 46* >60  GFRAA 59*   < > >60 53* >60  ANIONGAP 6   < > 8 7 11    < > = values in this interval not displayed.     Hematology Recent Labs  Lab 06/01/19 1814 06/02/19 0449  WBC 8.4 7.3  RBC 3.77* 3.82*  HGB 10.1* 10.2*  HCT 32.2* 32.5*  MCV 85.4 85.1  MCH 26.8 26.7  MCHC 31.4 31.4  RDW 15.3 15.4  PLT 320 301    Cardiac Enzymes  Recent Labs  Lab 05/19/19 0835 05/19/19 1146 06/01/19 1814 06/01/19 1919  TROPONINIHS 90* 82* 56* 56*      BNP Recent Labs  Lab 06/01/19 1814  BNP 358.0*     Radiology    No results found.  Telemetry    Sinus rhythm with a pacing and V sensing- Personally Reviewed  Cardiac Studies   2D Echocardiogram 10.5.2020  Echocardiogram (05/21/2019): 1. Left ventricular ejection fraction, by visual estimation, is >75%. The left ventricle has hyperdynamic function. There is  severely increased left ventricular hypertrophy. 2. Definity contrast agent was given IV to delineate the left ventricular endocardial borders. 3. Elevated mean left atrial pressure. 4. Hypertrophic cardiomyopathy. 5. Left ventricular diastolic Doppler parameters are consistent with pseudonormalization pattern of LV diastolic filling. 6. Global right ventricle was not well visualized.The right ventricular size is not well visualized. No increase in right ventricular wall thickness. 7. Left atrial size was mildly dilated. 8. Right atrial size was not well visualized. 9. The pericardium was not well visualized. 10. Mild aortic valve annular calcification. 11. The mitral valve is degenerative. Mild mitral valve regurgitation. 12. The tricuspid valve is not well visualized. Tricuspid valve regurgitation was not visualized by color flow Doppler. 13. The aortic valve was not well visualized Aortic valve regurgitation is trivial by color flow Doppler. Mild aortic valve sclerosis without stenosis. 14. The pulmonic valve was not well visualized. Pulmonic valve regurgitation is not visualized by color flow Doppler. 15. The inferior vena cava is normal in size with greater than 50% respiratory variability, suggesting right atrial pressure of 3 mmHg. 16. The interatrial septum was not well visualized. _____________   Patient Profile     58 y.o. ? w/ a h/o HCM s/p AICD, chronic HFpEF, COPD, and obesity, who was admitted 10/16 w/ dyspnea, URI, and volume overload.  Assessment & Plan    1. Hypertrophic CM/Acute on chronic diastolic CHF:  UO not accurately recorded the past two days.  Minus 580 so far today.  No weights since 10/17.  Dry wt appears to be about 97 kg.  All of that said, she does not appear to be significantly volume overloaded on exam.  Heart rate and blood pressure stable.  Continue current doses of beta-blocker, diltiazem, and oral Lasix.  As previously noted, plan on right heart  and left heart catheterization as outpatient, when she recovers from respiratory illness given concerns related to possible restrictive cardiomyopathy with low output.  2.  URI/AECOPD: Woke up this morning very hoarse and more dyspneic.  She does have upper airway wheezing.  Nebs/steroids per IM.  At this point, it does not appear that dyspnea is related to cardiomyopathy or  heart failure.  3.  Elevated troponin:  Minimal elevation w/ downtrend - 90  82  56  56.  No chest pain. Prior cath in 2018 w/ minimal nonobs CAD.  Outpatient right and left heart cath as outlined above once she recovers from #2.  4.  Normocytic anemia: Stable.  Signed, Murray Hodgkins, NP  06/07/2019, 11:54 AM    For questions or updates, please contact   Please consult [email or web address] for contact info under Cardiology/STEMI.

## 2019-06-07 NOTE — Progress Notes (Signed)
Physical Therapy Evaluation Patient Details Name: Julie Bender MRN: [id number] DOB: 19-Feb-1961 Today's Date: 06/07/2019   History of Present Illness  Julie Bender is a 58 y.o. F with multiple recent admissions. Pt presents to ED with similar sx to prior admissions including SOB, chest pain, bilateral LE N/T in hands and feet. Patient addmitted with diagnosis of chest pain. PMH includes COPD, HTN, DMII, hypertrophic cardiomyopathy, diastolic CHF, history of heart catheterization.  Clinical Impression  Pt is a pleasant 58 year old F who was admitted for hypertensive heart disease with heart failure. Upon entry, pt is resting in bed. She is responsive to questions and follows commands, but eyes are closed for start of assessment. Pt performs bed mobility with mod independence and transfers/ambulation with CGA and use of RW. Pt only able to amb 30 ft due to reported SOB. Despite report of symptoms, vitals remain WNL throughout tx. Pt demonstrates deficits with strength, activity tolerance, and mobility. Pt will benefit from skilled PT to address above deficits; current follow-up care recommendation is SNF due to current level of function compared to baseline.      Follow Up Recommendations SNF    Equipment Recommendations  Rolling walker with 5" wheels    Recommendations for Other Services       Precautions / Restrictions Precautions Precaution Comments: blind in L eye Restrictions Weight Bearing Restrictions: No      Mobility  Bed Mobility Overal bed mobility: Modified Independent             General bed mobility comments: Able to reach EOB without cueing or assistance. HOB elevated  Transfers Overall transfer level: Needs assistance Equipment used: Rolling walker (2 wheeled) Transfers: Sit to/from Stand Sit to Stand: Min guard         General transfer comment: Pt relied significantly on UE support during transfers. Pt presents with labored breathing throughout  transfer, but SpO2 and HR remained WNL  Ambulation/Gait Ambulation/Gait assistance: Min assist Gait Distance (Feet): 30 Feet Assistive device: Rolling walker (2 wheeled) Gait Pattern/deviations: Step-through pattern     General Gait Details: Pt amb with step through pattern using RW; labored breathing still present but again HR and SpO2 remain WNL.   Stairs            Wheelchair Mobility    Modified Rankin (Stroke Patients Only)       Balance Overall balance assessment: Needs assistance Sitting-balance support: Feet supported Sitting balance-Leahy Scale: Good     Standing balance support: Bilateral upper extremity supported Standing balance-Leahy Scale: Good                               Pertinent Vitals/Pain Pain Assessment: No/denies pain    Home Living Family/patient expects to be discharged to:: Private residence Living Arrangements: Parent;Other relatives Available Help at Discharge: Family Type of Home: House Home Access: Stairs to enter Entrance Stairs-Rails: Right Entrance Stairs-Number of Steps: 3-4 Home Layout: One level Home Equipment: [place] - 4 wheels;Shower seat Additional Comments: lives with mother and sister who are both unwell and unable to assist each other.    Prior Function Level of Independence: Independent         Comments: Pt reports that she uses rollator as needed- everytime she leaves the house and sometimes in the house if she isn't doing well     Hand Dominance   Dominant Hand: Right    Extremity/Trunk Assessment   Upper  Extremity Assessment Upper Extremity Assessment: Generalized weakness    Lower Extremity Assessment Lower Extremity Assessment: Generalized weakness    Cervical / Trunk Assessment Cervical / Trunk Assessment: Kyphotic  Communication   Communication: No difficulties  Cognition Arousal/Alertness: Awake/alert Behavior During Therapy: WFL for tasks assessed/performed Overall Cognitive  Status: Within Functional Limits for tasks assessed                                 General Comments: Pt is not feeling well      General Comments General comments (skin integrity, edema, etc.): Pt has labored breathing throughout session, but vitals remain WNL throughout session. Pt appears frustrated to not fully understand what is going on with her breathing and doesn't know how to improve    Exercises Other Exercises Other Exercises: Supine, 10 reps, bilat: AP, QS, SLR; PT supervision and verbal cueing to encourage pt to continue Other Exercises: Seated, 8 reps, bilateral: march, LAQ; PT supervision   Assessment/Plan    PT Assessment Patient needs continued PT services  PT Problem List Decreased strength;Decreased mobility;Decreased safety awareness;Decreased range of motion;Decreased coordination;Obesity;Decreased knowledge of precautions;Decreased activity tolerance;Cardiopulmonary status limiting activity;Decreased balance;Pain       PT Treatment Interventions DME instruction;Gait training;Stair training;Functional mobility training;Therapeutic activities;Therapeutic exercise;Balance training;Patient/family education    PT Goals (Current goals can be found in the Care Plan section)  Acute Rehab PT Goals Patient Stated Goal: to return home PT Goal Formulation: With patient Time For Goal Achievement: 06/21/19 Potential to Achieve Goals: Good    Frequency Min 2X/week   Barriers to discharge Decreased caregiver support      Co-evaluation               AM-PAC PT "6 Clicks" Mobility  Outcome Measure Help needed turning from your back to your side while in a flat bed without using bedrails?: None Help needed moving from lying on your back to sitting on the side of a flat bed without using bedrails?: None Help needed moving to and from a bed to a chair (including a wheelchair)?: A Little Help needed standing up from a chair using your arms (e.g.,  wheelchair or bedside chair)?: A Little Help needed to walk in hospital room?: A Little Help needed climbing 3-5 steps with a railing? : A Lot 6 Click Score: 19    End of Session Equipment Utilized During Treatment: Gait belt Activity Tolerance: Patient limited by fatigue;Treatment limited secondary to medical complications (Comment)(cardiopulm status) Patient left: with call bell/phone within reach;in chair Nurse Communication: Mobility status PT Visit Diagnosis: Unsteadiness on feet (R26.81);Muscle weakness (generalized) (M62.81);Difficulty in walking, not elsewhere classified (R26.2)    Time: [id number] PT Time Calculation (min) (ACUTE ONLY): 41 min   Charges:   PT Evaluation $PT Eval Moderate Complexity: 1 Mod PT Treatments $Therapeutic Exercise: 23-37 mins        Taiden Raybourn, SPT   Crystallee Werden 06/07/2019, 5:05 PM

## 2019-06-07 NOTE — Progress Notes (Signed)
Pulmonary Medicine          Date: 06/07/2019,   MRN# [id number] Julie Bender 1961-06-16     AdmissionWeight: 96.2 kg                 CurrentWeight: 98.6 kg      CHIEF COMPLAINT:   Severe shortness of breath    SUBJECTIVE   Clinically improved slept better last night.   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 [id number]  PAST MEDICAL HISTORY   Past Medical History:  Diagnosis Date   CHF (congestive heart failure) ([place])    COPD (chronic obstructive pulmonary disease) ([place])    Diabetes mellitus without complication ([place])    Diabetic retinopathy ([place])    History of placement of internal cardiac defibrillator    Hypertension    Hypertrophic cardiomyopathy ([place])      SURGICAL HISTORY   Past Surgical History:  Procedure Laterality Date   CARDIAC DEFIBRILLATOR PLACEMENT     CHOLECYSTECTOMY       FAMILY HISTORY   Family History  Problem Relation Age of Onset   Hypertrophic cardiomyopathy Sister      SOCIAL HISTORY   Social History   Tobacco Use   Smoking status: Passive Smoke Exposure - Never Smoker   Smokeless tobacco: Never Used  Substance Use Topics   Alcohol use: No   Drug use: Never     MEDICATIONS    Home Medication:    Current Medication:  Current Facility-Administered Medications:    acetaminophen (TYLENOL) tablet 650 mg, 650 mg, Oral, Q6H PRN, 650 mg at 06/06/19 2228 **OR** acetaminophen (TYLENOL) suppository 650 mg, 650 mg, Rectal, Q6H PRN, Lance Coon, MD   albuterol (PROVENTIL) (2.5 MG/3ML) 0.083% nebulizer solution 2.5 mg, 2.5 mg, Nebulization, Q2H PRN, Gouru, Aruna, MD   ALPRAZolam Duanne Moron) tablet 0.5 mg, 0.5 mg, Oral, Q4H PRN, Cristofano, Paul A, MD, 0.5 mg at 06/07/19 1247   aspirin EC tablet 81 mg, 81 mg, Oral, Daily, Lance Coon, MD, 81 mg at 06/07/19 0947   atorvastatin (LIPITOR) tablet 40 mg, 40 mg, Oral, q1800, Lance Coon, MD, 40 mg at 06/06/19 1724   benzonatate (TESSALON) capsule 200 mg,  200 mg, Oral, TID, Gouru, Aruna, MD, 200 mg at 06/07/19 0948   bisacodyl (DULCOLAX) suppository 10 mg, 10 mg, Rectal, Daily PRN, Gouru, Aruna, MD, 10 mg at 06/03/19 1711   chlorpheniramine-HYDROcodone (TUSSIONEX) 10-8 MG/5ML suspension 5 mL, 5 mL, Oral, Q6H, Synethia Endicott, MD, 5 mL at 06/07/19 0950   citalopram (CELEXA) tablet 40 mg, 40 mg, Oral, Daily, Cristofano, Paul A, MD, 40 mg at 06/07/19 0948   diltiazem (CARDIZEM) tablet 60 mg, 60 mg, Oral, Q8H, Gouru, Aruna, MD, 60 mg at 06/07/19 1006   enoxaparin (LOVENOX) injection 40 mg, 40 mg, Subcutaneous, Q24H, Lance Coon, MD, 40 mg at 06/07/19 0556   furosemide (LASIX) tablet 40 mg, 40 mg, Oral, Daily, End, Christopher, MD, 40 mg at 06/07/19 0947   guaiFENesin-dextromethorphan (ROBITUSSIN DM) 100-10 MG/5ML syrup 10 mL, 10 mL, Oral, Q4H PRN, Gouru, Aruna, MD, 10 mL at 06/06/19 0052   hydrALAZINE (APRESOLINE) injection 10 mg, 10 mg, Intravenous, Q4H PRN, Lance Coon, MD, 10 mg at 06/05/19 2043   insulin aspart (novoLOG) injection 0-15 Units, 0-15 Units, Subcutaneous, TID AC & HS, Lance Coon, MD, 5 Units at 06/07/19 1247   insulin aspart (novoLOG) injection 12 Units, 12 Units, Subcutaneous, TID WC, Gouru, Aruna, MD, 12 Units at 06/07/19 1247   insulin detemir (LEVEMIR)  injection 42 Units, 42 Units, Subcutaneous, Daily, Gouru, Aruna, MD, 42 Units at 06/07/19 0951   ipratropium-albuterol (DUONEB) 0.5-2.5 (3) MG/3ML nebulizer solution 3 mL, 3 mL, Inhalation, Q6H, Gouru, Aruna, MD, 3 mL at 06/07/19 0753   magnesium sulfate IVPB 2 g 50 mL, 2 g, Intravenous, Once, Gouru, Aruna, MD, Stopped at 06/05/19 1041   methylPREDNISolone sodium succinate (SOLU-MEDROL) 40 mg/mL injection 40 mg, 40 mg, Intravenous, Daily, Gouru, Aruna, MD, 40 mg at 06/07/19 0948   metoprolol tartrate (LOPRESSOR) tablet 25 mg, 25 mg, Oral, BID, End, Harrell Gave, MD, 25 mg at 06/06/19 2252   ondansetron (ZOFRAN) tablet 4 mg, 4 mg, Oral, Q6H PRN **OR** ondansetron  (ZOFRAN) injection 4 mg, 4 mg, Intravenous, Q6H PRN, Lance Coon, MD   oxyCODONE (Oxy IR/ROXICODONE) immediate release tablet 5 mg, 5 mg, Oral, Q6H PRN, Lance Coon, MD, 5 mg at 06/05/19 2043   pantoprazole (PROTONIX) EC tablet 40 mg, 40 mg, Oral, BID AC, Lance Coon, MD, 40 mg at 06/07/19 0948   phenol (CHLORASEPTIC) mouth spray 1 spray, 1 spray, Mouth/Throat, PRN, Gouru, Aruna, MD, 1 spray at 06/07/19 0950   polyethylene glycol (MIRALAX / GLYCOLAX) packet 17 g, 17 g, Oral, Daily, Gouru, Aruna, MD, 17 g at 06/07/19 [id number]   sodium chloride flush (NS) 0.9 % injection 3 mL, 3 mL, Intravenous, Q12H, Gouru, Aruna, MD, 3 mL at 06/07/19 1015   sodium chloride flush (NS) 0.9 % injection 3 mL, 3 mL, Intravenous, PRN, Gouru, Aruna, MD, 3 mL at 06/03/19 1705   sodium phosphate (FLEET) 7-19 GM/118ML enema 1 enema, 1 enema, Rectal, Daily PRN, Gouru, Aruna, MD    ALLERGIES   Patient has no known allergies.     REVIEW OF SYSTEMS    Review of Systems:  Gen:  Denies  fever, sweats, chills weigh loss  HEENT: Denies blurred vision, double vision, ear pain, eye pain, hearing loss, nose bleeds, sore throat Cardiac:  No dizziness, chest pain or heaviness, chest tightness,edema Resp:   Denies cough or sputum porduction, shortness of breath,wheezing, hemoptysis,  Gi: Denies swallowing difficulty, stomach pain, nausea or vomiting, diarrhea, constipation, bowel incontinence Gu:  Denies bladder incontinence, burning urine Ext:   Denies Joint pain, stiffness or swelling Skin: Denies  skin rash, easy bruising or bleeding or hives Endoc:  Denies polyuria, polydipsia , polyphagia or weight change Psych:   Denies depression, insomnia or hallucinations   Other:  All other systems negative   VS: BP 134/76 (BP Location: Left Arm)    Pulse (!) 55    Temp (!) 97.5 F (36.4 C) (Oral)    Resp 20    Ht 5\' 4"  (1.626 m)    Wt 98.6 kg    SpO2 99%    BMI 37.31 kg/m      PHYSICAL EXAM    GENERAL:NAD,  no fevers, chills, no weakness no fatigue HEAD: Normocephalic, atraumatic.  EYES: Pupils equal, round, reactive to light. Extraocular muscles intact. No scleral icterus.  MOUTH: Moist mucosal membrane. Dentition intact. No abscess noted.  EAR, NOSE, THROAT: Clear without exudates. No external lesions.  NECK: Supple. No thyromegaly. No nodules. No JVD.  PULMONARY: mild wheezing  CARDIOVASCULAR: S1 and S2. Regular rate and rhythm. No murmurs, rubs, or gallops. No edema. Pedal pulses 2+ bilaterally.  GASTROINTESTINAL: Soft, nontender, nondistended. No masses. Positive bowel sounds. No hepatosplenomegaly.  MUSCULOSKELETAL: No swelling, clubbing, or edema. Range of motion full in all extremities.  NEUROLOGIC: Cranial nerves II through XII are intact. No gross focal  neurological deficits. Sensation intact. Reflexes intact.  SKIN: No ulceration, lesions, rashes, or cyanosis. Skin warm and dry. Turgor intact.  PSYCHIATRIC: Mood, affect within normal limits. The patient is awake, alert and oriented x 3. Insight, judgment intact.       IMAGING    Dg Chest 2 View  Result Date: 05/19/2019 CLINICAL DATA:  Bilateral lower extremity pain. Shortness of breath. EXAM: CHEST - 2 VIEW COMPARISON:  02/23/2019 FINDINGS: There is mild bilateral interstitial thickening. There is no focal consolidation. There is no pleural effusion or pneumothorax. There is stable cardiomegaly. There is a dual lead cardiac pacemaker. There is no acute osseous abnormality. IMPRESSION: Cardiomegaly with mild pulmonary vascular congestion. Electronically Signed   By: Kathreen Devoid   On: 05/19/2019 09:22   Ct Chest Wo Contrast  Result Date: 06/04/2019 CLINICAL DATA:  Cough and shortness of breath. EXAM: CT CHEST WITHOUT CONTRAST TECHNIQUE: Multidetector CT imaging of the chest was performed following the standard protocol without IV contrast. COMPARISON:  05/19/2019 FINDINGS: Cardiovascular: Intracardiac AICD leads are unchanged. The  heart remains mildly enlarged. Interval small pericardial effusion with a maximum thickness of 7 mm. The main pulmonary artery remains dilated with a transverse diameter of 3.8 cm. Mediastinum/Nodes: Diffusely enlarged and minimally heterogeneous thyroid gland with a curvilinear calcification in the left lobe. No enlarged lymph nodes. Lungs/Pleura: Multiple small bilateral calcified granulomata are again demonstrated. No pleural fluid. Mild linear atelectasis/scarring in the lingula with improvement. Mild bilateral bullous changes. Upper Abdomen: Cholecystectomy clips. Musculoskeletal: Mild thoracic spine degenerative changes. IMPRESSION: 1. Interval small pericardial effusion with a maximum thickness of 7 mm. 2. Stable enlarged main pulmonary artery, possibly due to pulmonary arterial hypertension. 3. Stable mild cardiomegaly and mild changes of COPD. Emphysema (ICD10-J43.9). Electronically Signed   By: Claudie Revering M.D.   On: 06/04/2019 09:42   Ct Angio Chest Pe W And/or Wo Contrast  Result Date: 05/19/2019 CLINICAL DATA:  Bilateral lower extremity pain and shortness of breath. EXAM: CT ANGIOGRAPHY CHEST CT ABDOMEN AND PELVIS WITH CONTRAST TECHNIQUE: Multidetector CT imaging of the chest was performed using the standard protocol during bolus administration of intravenous contrast. Multiplanar CT image reconstructions and MIPs were obtained to evaluate the vascular anatomy. Multidetector CT imaging of the abdomen and pelvis was performed using the standard protocol during bolus administration of intravenous contrast. CONTRAST:  160mL OMNIPAQUE IOHEXOL 350 MG/ML SOLN COMPARISON:  CT dated 04/18/2018 FINDINGS: CTA CHEST FINDINGS Cardiovascular: Contrast injection is sufficient to demonstrate satisfactory opacification of the pulmonary arteries to the segmental level. There is no pulmonary embolus. The main pulmonary artery is dilated measuring approximately 3.8 cm in diameter. There is no CT evidence of acute  right heart strain. There are mild atherosclerotic changes of the visualized thoracic aorta. Heart size is enlarged. There is significant wall thickening of the left ventricle. 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 There is no significant pericardial effusion. Mediastinum/Nodes: --No mediastinal or hilar lymphadenopathy. --No axillary lymphadenopathy. --No supraclavicular lymphadenopathy. --a large left-sided thyroid nodule is again noted. --The esophagus is unremarkable Lungs/Pleura: The lung volumes are low. The trachea is unremarkable. There is a trace right-sided pleural effusion. There is no pneumothorax or focal infiltrate. There is a small amount of atelectasis in the lingula. Musculoskeletal: No chest wall abnormality. No acute or significant osseous findings. Review of the MIP images confirms the above findings. CT ABDOMEN and PELVIS FINDINGS Hepatobiliary: The liver is normal. Status post cholecystectomy.There is no biliary ductal dilation. Pancreas: Normal contours without ductal  dilatation. No peripancreatic fluid collection. Spleen: No splenic laceration or hematoma. Adrenals/Urinary Tract: --Adrenal glands: No adrenal hemorrhage. --Right kidney/ureter: No hydronephrosis or perinephric hematoma. --Left kidney/ureter: No hydronephrosis or perinephric hematoma. --Urinary bladder: Unremarkable. Stomach/Bowel: --Stomach/Duodenum: No hiatal hernia or other gastric abnormality. Normal duodenal course and caliber. --Small bowel: No dilatation or inflammation. --Colon: No focal abnormality. --Appendix: Normal. Vascular/Lymphatic: Atherosclerotic calcification is present within the non-aneurysmal abdominal aorta, without hemodynamically significant stenosis. --No retroperitoneal lymphadenopathy. --No mesenteric lymphadenopathy. --No pelvic or inguinal lymphadenopathy. Reproductive: Unremarkable Other: There is a partially visualized 1.7 x 1.2 cm subcutaneous nodule in the left gluteal fold. The abdominal wall is  normal. Musculoskeletal. No acute displaced fractures. Review of the MIP images confirms the above findings. IMPRESSION: 1. No acute thoracic, abdominal or pelvic injury. 2. Cardiomegaly with significant wall thickening of the left ventricle. 3. Trace right-sided pleural effusion. 4. Dilated main pulmonary artery which can be seen in patients with elevated pulmonary artery pressures. 5. Partially visualized 1.7 x 1.2 cm subcutaneous nodule in the left gluteal fold. Recommend correlation with physical examination. This could represent a sebaceous cyst. Aortic Atherosclerosis (ICD10-I70.0). Electronically Signed   By: Constance Holster M.D.   On: 05/19/2019 11:06   Ct Abdomen Pelvis W Contrast  Result Date: 05/19/2019 CLINICAL DATA:  Bilateral lower extremity pain and shortness of breath. EXAM: CT ANGIOGRAPHY CHEST CT ABDOMEN AND PELVIS WITH CONTRAST TECHNIQUE: Multidetector CT imaging of the chest was performed using the standard protocol during bolus administration of intravenous contrast. Multiplanar CT image reconstructions and MIPs were obtained to evaluate the vascular anatomy. Multidetector CT imaging of the abdomen and pelvis was performed using the standard protocol during bolus administration of intravenous contrast. CONTRAST:  157mL OMNIPAQUE IOHEXOL 350 MG/ML SOLN COMPARISON:  CT dated 04/18/2018 FINDINGS: CTA CHEST FINDINGS Cardiovascular: Contrast injection is sufficient to demonstrate satisfactory opacification of the pulmonary arteries to the segmental level. There is no pulmonary embolus. The main pulmonary artery is dilated measuring approximately 3.8 cm in diameter. There is no CT evidence of acute right heart strain. There are mild atherosclerotic changes of the visualized thoracic aorta. Heart size is enlarged. There is significant wall thickening of the left ventricle. 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 There is no significant pericardial effusion. Mediastinum/Nodes: --No mediastinal or  hilar lymphadenopathy. --No axillary lymphadenopathy. --No supraclavicular lymphadenopathy. --a large left-sided thyroid nodule is again noted. --The esophagus is unremarkable Lungs/Pleura: The lung volumes are low. The trachea is unremarkable. There is a trace right-sided pleural effusion. There is no pneumothorax or focal infiltrate. There is a small amount of atelectasis in the lingula. Musculoskeletal: No chest wall abnormality. No acute or significant osseous findings. Review of the MIP images confirms the above findings. CT ABDOMEN and PELVIS FINDINGS Hepatobiliary: The liver is normal. Status post cholecystectomy.There is no biliary ductal dilation. Pancreas: Normal contours without ductal dilatation. No peripancreatic fluid collection. Spleen: No splenic laceration or hematoma. Adrenals/Urinary Tract: --Adrenal glands: No adrenal hemorrhage. --Right kidney/ureter: No hydronephrosis or perinephric hematoma. --Left kidney/ureter: No hydronephrosis or perinephric hematoma. --Urinary bladder: Unremarkable. Stomach/Bowel: --Stomach/Duodenum: No hiatal hernia or other gastric abnormality. Normal duodenal course and caliber. --Small bowel: No dilatation or inflammation. --Colon: No focal abnormality. --Appendix: Normal. Vascular/Lymphatic: Atherosclerotic calcification is present within the non-aneurysmal abdominal aorta, without hemodynamically significant stenosis. --No retroperitoneal lymphadenopathy. --No mesenteric lymphadenopathy. --No pelvic or inguinal lymphadenopathy. Reproductive: Unremarkable Other: There is a partially visualized 1.7 x 1.2 cm subcutaneous nodule in the left gluteal fold. The abdominal wall is normal.  Musculoskeletal. No acute displaced fractures. Review of the MIP images confirms the above findings. IMPRESSION: 1. No acute thoracic, abdominal or pelvic injury. 2. Cardiomegaly with significant wall thickening of the left ventricle. 3. Trace right-sided pleural effusion. 4. Dilated main  pulmonary artery which can be seen in patients with elevated pulmonary artery pressures. 5. Partially visualized 1.7 x 1.2 cm subcutaneous nodule in the left gluteal fold. Recommend correlation with physical examination. This could represent a sebaceous cyst. Aortic Atherosclerosis (ICD10-I70.0). Electronically Signed   By: Constance Holster M.D.   On: 05/19/2019 11:06   Dg Chest Portable 1 View  Result Date: 06/01/2019 CLINICAL DATA:  Cough, body aches and fatigue for 5 days EXAM: PORTABLE CHEST 1 VIEW COMPARISON:  Radiograph 05/25/2019, CT angio 05/19/2019 FINDINGS: Hazy interstitial opacities throughout the lungs with a basilar predominance are mild central venous congestion with cardiomegaly which is similar to comparison exam. Dual lead pacer pack overlies the left chest wall with the lead at the cardiac apex and right atrium. No pneumothorax. No visible effusion. No acute osseous or soft tissue abnormality. IMPRESSION: 1. Unchanged bilateral diffuse interstitial opacities and central venous congestion likely reflecting some interstitial edema. 2. Stable cardiomegaly. Electronically Signed   By: Lovena Le M.D.   On: 06/01/2019 17:04   Dg Chest Port 1 View  Result Date: 05/25/2019 CLINICAL DATA:  Shortness of breath on exertion EXAM: PORTABLE CHEST 1 VIEW COMPARISON:  Chest radiograph dated 05/19/2019 and CT chest dated 05/19/2019. FINDINGS: A left subclavian approach cardiac device is redemonstrated. A 2.3 cm long radiopacity overlies the cardiac silhouette and may be external to the patient. The heart remains enlarged. Vascular calcifications are seen in the aortic arch. There is a mild diffuse bilateral interstitial opacities which are not significantly changed. There is no pleural effusion or pneumothorax. IMPRESSION: 1. No significant interval change in the mild diffuse interstitial opacities. 2. A 2.3 cm long radiopacity overlies the cardiac silhouette and may be external to the patient. 3.  Stable cardiomegaly. Electronically Signed   By: Zerita Boers M.D.   On: 05/25/2019 11:00             ASSESSMENT/PLAN    SOB and cough due to Rhinovirus infection  Complicated by hypertrophic cardiomyopathy and PAH  - reviewed serial CT chest - diffuse GGO is improved from previous done 2 weeks ago  - she does feel relief from albuterol  - patient endorses some symptoms of anxiety- s/p psych evaluation.  -PH is likely pre and post capillary Group 2 and 1 - she is currently NYHA/WHO 3-4 - would recommend RHC for full evaluation and tx , can be done on outpatient -patient is a never smoker   Obstructive sleep apnea diagnostic sleep study 10/02/2017>>  PLM index of 132, PLM arousal index of 13.7.  Mild sleep apnea with an AHI of 7.1.  CPAP titration study was recommended as well as work-up for periodic limb movement disorder. CPAP titration study 12/05/2017>> PLM index 65, PLM arousal index of 2.  Hypopnea index remained elevated above 10 and all pressures until a pressure of 10 was achieved and AHI was 0. CPAP of 10 was recommended.  -patient did not wear consistently.        Thank you for allowing me to participate in the care of this patient.    Patient/Family are satisfied with care plan and all questions have been answered.  This document was prepared using Dragon voice recognition software and may include unintentional dictation errors.  Ottie Glazier, M.D.  Division of [place]

## 2019-06-07 NOTE — Care Management Important Message (Signed)
Important Message  Patient Details  Name: Kathe Campa MRN: [id number] Date of Birth: 1960-11-15   Medicare Important Message Given:  Yes     [name] A Kiya Eno 06/07/2019, 11:21 AM

## 2019-06-08 LAB — CREATININE, SERUM
Creatinine, Ser: 0.97 mg/dL (ref 0.44–1.00)
GFR calc Af Amer: >60 mL/min (ref >60)
GFR calc non Af Amer: >60 mL/min (ref >60)

## 2019-06-08 LAB — GLUCOSE, CAPILLARY
Glucose-Capillary: 106 mg/dL — ABNORMAL HIGH (ref 70–99)
Glucose-Capillary: 306 mg/dL — ABNORMAL HIGH (ref 70–99)
Glucose-Capillary: 440 mg/dL — ABNORMAL HIGH (ref 70–99)
Glucose-Capillary: 450 mg/dL — ABNORMAL HIGH (ref 70–99)
Glucose-Capillary: 412 mg/dL — ABNORMAL HIGH (ref 70–99)
Glucose-Capillary: 348 mg/dL — ABNORMAL HIGH (ref 70–99)

## 2019-06-08 MED ORDER — HYDROCOD POLST-CPM POLST ER 10-8 MG/5ML PO SUER: 5.0000 mL | Freq: Two times a day (BID) | ORAL | 0 refills | Status: DC | PRN

## 2019-06-08 MED ORDER — DILTIAZEM HCL 60 MG PO TABS: 60.0000 mg | ORAL_TABLET | Freq: Three times a day (TID) | ORAL | 0 refills | Status: DC

## 2019-06-08 MED ORDER — OXYCODONE HCL 5 MG PO TABS: 5.0000 mg | ORAL_TABLET | Freq: Four times a day (QID) | ORAL | 0 refills | Status: DC | PRN

## 2019-06-08 MED ORDER — METOPROLOL TARTRATE 25 MG PO TABS: 25.0000 mg | ORAL_TABLET | Freq: Two times a day (BID) | ORAL | 0 refills | Status: DC

## 2019-06-08 MED ORDER — PREDNISONE 50 MG PO TABS
50.0000 mg | ORAL_TABLET | Freq: Every day | ORAL | Status: DC
  Administered 2019-06-08 – 2019-06-09 (×2): 50 mg via ORAL
  Filled 2019-06-08: qty 1

## 2019-06-08 MED ORDER — PREDNISONE 10 MG PO TABS: 10.0000 mg | ORAL_TABLET | Freq: Every day | ORAL | 0 refills | Status: DC

## 2019-06-08 MED ORDER — TORSEMIDE 20 MG PO TABS: 60.0000 mg | ORAL_TABLET | Freq: Every day | ORAL | 0 refills | Status: DC

## 2019-06-08 MED ORDER — POLYETHYLENE GLYCOL 3350 17 G PO PACK: 17.0000 g | PACK | Freq: Every day | ORAL | 0 refills | Status: DC

## 2019-06-08 NOTE — Progress Notes (Signed)
Physical Therapy Treatment Patient Details Name: Julie Bender MRN: [id number] DOB: 1961/05/03 Today's Date: 06/08/2019    History of Present Illness Julie Bender is a 58 y.o. F with multiple recent admissions. Pt presents to ED with similar sx to prior admissions including SOB, chest pain, bilateral LE N/T in hands and feet. Patient addmitted with diagnosis of hypertensive heart disease with heart failure. PMH includes COPD, HTN, DMII, hypertrophic cardiomyopathy, diastolic CHF, history of heart catheterization.    PT Comments    Pt is progressing toward goals. Upon entry, pt is resting in bed with visitor in room; appears more alert than previous session and is agreeable to bed exercises. This date, pt performs bed mobility, transfers, and ambulation with mod I/min guard and use of RW. Tolerated increased bed exercises. SpO2 remained ~90 throughout tx without supplemental oxygen. Pt will continue to benefit from skilled PT to address deficits in activity tolerance, strength, balance, and mobility.     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 Reduced labored breathing in comparison to yesterday.  Ambulation/Gait Ambulation/Gait assistance: Min guard Gait Distance (Feet): 40 Feet Assistive device: Rolling walker (2  wheeled) Gait Pattern/deviations: Step-through pattern     General Gait Details: Pt amb using step through pattern to reach RW. At times pt attempts to use coutnertops instead of walker. Educated not to get up without assistance   Marine scientist [place] (Stroke Patients Only)       Balance Overall balance assessment: Needs assistance Sitting-balance support: Feet supported Sitting balance-Leahy Scale: Good     Standing balance support: Bilateral upper extremity supported Standing balance-Leahy Scale: Good                              Cognition Arousal/Alertness: Awake/alert Behavior During Therapy: WFL for tasks assessed/performed Overall Cognitive Status: Within Functional Limits for tasks assessed                                        Exercises Other Exercises Other Exercises: Supine, 10 reps, bilat: AP, QS, HS, SLR, hip ad/ab; PT supervision    General Comments General comments (skin integrity, edema, etc.): Had conversation with patient regarding follow up care. Pt seems to be agreeable to PT involvement with care upon hospital d/c. Understands that she is not at this point at her baseline level of function      Pertinent Vitals/Pain Pain Assessment: No/denies pain    Home Living  Prior Function            PT Goals (current goals can now be found in the care plan section) Acute Rehab PT Goals Patient Stated Goal: to return home PT Goal Formulation: With patient Time For Goal Achievement: 06/21/19 Potential to Achieve Goals: Good Progress towards PT goals: Progressing toward goals    Frequency    Min 2X/week      PT Plan Current plan remains appropriate    Co-evaluation              AM-PAC PT "6 Clicks" Mobility   Outcome Measure  Help needed turning from your back to your side while in a flat bed without using bedrails?: None Help  needed moving from lying on your back to sitting on the side of a flat bed without using bedrails?: None Help needed moving to and from a bed to a chair (including a wheelchair)?: A Little Help needed standing up from a chair using your arms (e.g., wheelchair or bedside chair)?: A Little Help needed to walk in hospital room?: A Little Help needed climbing 3-5 steps with a railing? : A Lot 6 Click Score: 19    End of Session Equipment Utilized During Treatment: Gait belt Activity Tolerance: Patient limited by fatigue Patient left: with call bell/phone within reach;in bed;with bed alarm set Nurse Communication: Mobility status PT Visit Diagnosis: Unsteadiness on feet (R26.81);Muscle weakness (generalized) (M62.81);Difficulty in walking, not elsewhere classified (R26.2)     Time: [id number] PT Time Calculation (min) (ACUTE ONLY): 26 min  Charges:                        Dixie Dials, SPT    Halynn Reitano 06/08/2019, 4:56 PM

## 2019-06-08 NOTE — Progress Notes (Signed)
Pt is ready to be D/C today. Notified Malachy Mood with [facility] that pt is going home today.

## 2019-06-08 NOTE — Discharge Summary (Signed)
[place] [facility] [place] NAME: Julie Bender    MR#:  [id number]  DATE OF BIRTH:  Jan 11, 1961  DATE OF ADMISSION:  06/01/2019 ADMITTING PHYSICIAN: Lance Coon, MD  DATE OF DISCHARGE: 06/08/2019  PRIMARY CARE PHYSICIAN: [facility]   ADMISSION DIAGNOSIS:  Weakness [R53.1] Idiopathic hypotension [I95.0]  DISCHARGE DIAGNOSIS:  Principal Problem:   Hypertensive heart disease with heart failure (HCC) Active Problems:   OSA (obstructive sleep apnea)   Diabetes mellitus ([place])   Essential hypertension   Asthma exacerbation   Generalized anxiety disorder Obstructive sleep apnea Acute respiratory distress with rhinovirus infection Hypertrophic obstructive cardiomyopathy Nonsustained ventricular tachycardia SECONDARY DIAGNOSIS:   Past Medical History:  Diagnosis Date  . CHF (congestive heart failure) ([place])   . COPD (chronic obstructive pulmonary disease) ([place])   . Diabetes mellitus without complication ([place])   . Diabetic retinopathy ([place])   . History of placement of internal cardiac defibrillator   . Hypertension   . Hypertrophic cardiomyopathy [facility])      ADMITTING HISTORY Julie Bender  is a 58 y.o. female who presents with chief complaint as above.  Patient presents to the ED with a complaint of cough and increased weakness.  She was recently admitted here for heart failure and excessive fluid.  She states that in the week and a half or so that she was home, she initially felt well, but began to feel progressively worse.  Today she was very short of breath.  She does feel like she has significant edema in her abdominal space.  On evaluation, she does have mild edema on her x-ray, though this is consistent with her discharge status.  Hospitalist were called for admission and further evaluation  HOSPITAL COURSE:  Patient was admitted to telemetry.  She was started on diuresis with Lasix and cardiology was consulted.  [place]  cardiology evaluated patient during hospitalization.  Patient was worked up with echocardiogram.  She was also seen by pulmonary attending during hospitalization for reactive airway disease.  Respiratory panel was positive for rhinovirus.  Patient was managed for hypertensive obstructive cardiomyopathy.  She was started on oral diltiazem, metoprolol and diuresed with Lasix cautiously.  Right heart cath for further evaluation as outpatient recommended by cardiology.  For reactive airway disease patient was started on IV Solu-Medrol and tapered.  He also received a nebulization therapy.  Physical therapy also evaluated the patient during hospitalization.  Shortness of breath improved.  Anxiolytics were given for anxiety disorder.  Cardiogram was reviewed which showed EF of greater than 75% with hypertrophic cardiomyopathy.  Patient was worked up with CT chest during this hospitalization.  CT chest showed small pericardial effusion maximum thickness of 7 mm stable enlarged pulmonary artery possibly secondary to pulmonary hypertension was also noted.  Patient has been weaned off oxygen received physical therapy.  Patient was also evaluated by psychiatry during her hospitalization for anxiety disorder.  CONSULTS OBTAINED:  Treatment Team:  Nelva Bush, MD Flora Lipps, MD Ottie Glazier, MD Dixie Dials, MD  DRUG ALLERGIES:  No Known Allergies  DISCHARGE MEDICATIONS:   Allergies as of 06/08/2019   No Known Allergies     Medication List    STOP taking these medications   metolazone 2.5 MG tablet Commonly known as: ZAROXOLYN   metoprolol succinate 25 MG 24 hr tablet Commonly known as: TOPROL-XL   verapamil 240 MG CR tablet Commonly known as: CALAN-SR     TAKE these medications   albuterol  108 (90 Base) MCG/ACT inhaler Commonly known as: VENTOLIN HFA Inhale 2 puffs into the lungs every 6 (six) hours as needed for wheezing or shortness of breath.   aspirin EC 81 MG tablet Take  81 mg by mouth daily.   atorvastatin 40 MG tablet Commonly known as: LIPITOR Take 1 tablet (40 mg total) by mouth daily at 6 PM. What changed: when to take this   chlorpheniramine-HYDROcodone 10-8 MG/5ML Suer Commonly known as: TUSSIONEX Take 5 mLs by mouth every 12 (twelve) hours as needed for cough.   citalopram 20 MG tablet Commonly known as: CELEXA Take 20 mg by mouth daily.   diltiazem 60 MG tablet Commonly known as: CARDIZEM Take 1 tablet (60 mg total) by mouth every 8 (eight) hours.   gabapentin 300 MG capsule Commonly known as: NEURONTIN Take 1 capsule (300 mg total) by mouth 2 (two) times daily.   insulin detemir 100 UNIT/ML injection Commonly known as: LEVEMIR Inject 42 Units into the skin at bedtime.   metFORMIN 850 MG tablet Commonly known as: GLUCOPHAGE Take 850 mg by mouth 3 (three) times daily.   metoprolol tartrate 25 MG tablet Commonly known as: LOPRESSOR Take 1 tablet (25 mg total) by mouth 2 (two) times daily.   nitroGLYCERIN 0.4 MG SL tablet Commonly known as: NITROSTAT Place 1 tablet (0.4 mg total) under the tongue every 5 (five) minutes as needed for chest pain.   NovoLOG FlexPen 100 UNIT/ML FlexPen Generic drug: insulin aspart Inject 13 Units into the skin 3 (three) times daily with meals.   oxyCODONE 5 MG immediate release tablet Commonly known as: Oxy IR/ROXICODONE Take 1 tablet (5 mg total) by mouth every 6 (six) hours as needed for moderate pain.   pantoprazole 40 MG tablet Commonly known as: PROTONIX Take 1 tablet (40 mg total) by mouth 2 (two) times daily before a meal.   polyethylene glycol 17 g packet Commonly known as: MIRALAX / GLYCOLAX Take 17 g by mouth daily. Start taking on: June 09, 2019   predniSONE 10 MG tablet Commonly known as: DELTASONE Take 1 tablet (10 mg total) by mouth daily. Label  & dispense according to the schedule below.  6 tablets day one, then 5 table day 2, then 4 tablets day 3, then 3 tablets day 4, 2  tablets day 5, then 1 tablet day 6, then stop   torsemide 20 MG tablet Commonly known as: DEMADEX Take 3 tablets (60 mg total) by mouth daily. Start taking on: June 09, 2019 What changed:   how much to take  when to take this   traZODone 150 MG tablet Commonly known as: DESYREL Take 150 mg by mouth at bedtime.       Today  Patient seen today Decrease shortness of breath Tolerating diet well Hemodynamically stable VITAL SIGNS:  Blood pressure (!) 150/61, pulse (!) 55, temperature 98.8 F (37.1 C), temperature source Oral, resp. rate 18, height 5\' 4"  (1.626 m), weight 99.6 kg, SpO2 96 %.  I/O:    Intake/Output Summary (Last 24 hours) at 06/08/2019 1202 Last data filed at 06/08/2019 0500 Gross per 24 hour  Intake 480 ml  Output 1900 ml  Net -1420 ml    PHYSICAL EXAMINATION:  Physical Exam  GENERAL:  58 y.o.-year-old patient lying in the bed with no acute distress.  LUNGS: Normal breath sounds bilaterally, no wheezing, rales,rhonchi or crepitation. No use of accessory muscles of respiration.  CARDIOVASCULAR: S1, S2 normal. No murmurs, rubs, or gallops.  ABDOMEN:  Soft, non-tender, non-distended. Bowel sounds present. No organomegaly or mass.  NEUROLOGIC: Moves all 4 extremities. PSYCHIATRIC: The patient is alert and oriented x 3.  SKIN: No obvious rash, lesion, or ulcer.   DATA REVIEW:   CBC Recent Labs  Lab 06/02/19 0449  WBC 7.3  HGB 10.2*  HCT 32.5*  PLT 301    Chemistries  Recent Labs  Lab 06/01/19 1814  06/06/19 0355 06/06/19 1522 06/08/19 0532  NA 139   < > 137  --   --   K 4.3   < > 4.5  --   --   CL 106   < > 101  --   --   CO2 27   < > 25  --   --   GLUCOSE 109*   < > 160*  --   --   BUN 21*   < > 34*  --   --   CREATININE 1.16*   < > 0.81  --  0.97  CALCIUM 8.6*   < > 9.7  --   --   MG  --    < > 2.4 2.3  --   AST 15  --   --   --   --   ALT 11  --   --   --   --   ALKPHOS 66  --   --   --   --   BILITOT 0.5  --   --   --   --     < > = values in this interval not displayed.    Cardiac Enzymes No results for input(s): TROPONINI in the last 168 hours.  Microbiology Results  Results for orders placed or performed during the hospital encounter of 06/01/19  SARS Coronavirus 2 by RT PCR (hospital order, performed in [facility] hospital lab) Nasopharyngeal Nasopharyngeal Swab     Status: None   Collection Time: 06/01/19  6:18 PM   Specimen: Nasopharyngeal Swab  Result Value Ref Range Status   SARS Coronavirus 2 NEGATIVE NEGATIVE Final    Comment: (NOTE) If result is NEGATIVE SARS-CoV-2 target nucleic acids are NOT DETECTED. The SARS-CoV-2 RNA is generally detectable in upper and lower  respiratory specimens during the acute phase of infection. The lowest  concentration of SARS-CoV-2 viral copies this assay can detect is 250  copies / mL. A negative result does not preclude SARS-CoV-2 infection  and should not be used as the sole basis for treatment or other  patient management decisions.  A negative result may occur with  improper specimen collection / handling, submission of specimen other  than nasopharyngeal swab, presence of viral mutation(s) within the  areas targeted by this assay, and inadequate number of viral copies  (<250 copies / mL). A negative result must be combined with clinical  observations, patient history, and epidemiological information. If result is POSITIVE SARS-CoV-2 target nucleic acids are DETECTED. The SARS-CoV-2 RNA is generally detectable in upper and lower  respiratory specimens dur ing the acute phase of infection.  Positive  results are indicative of active infection with SARS-CoV-2.  Clinical  correlation with patient history and other diagnostic information is  necessary to determine patient infection status.  Positive results do  not rule out bacterial infection or co-infection with other viruses. If result is PRESUMPTIVE POSTIVE SARS-CoV-2 nucleic acids MAY BE PRESENT.   A  presumptive positive result was obtained on the submitted specimen  and confirmed on repeat testing.  While 2019  novel coronavirus  (SARS-CoV-2) nucleic acids may be present in the submitted sample  additional confirmatory testing may be necessary for epidemiological  and / or clinical management purposes  to differentiate between  SARS-CoV-2 and other Sarbecovirus currently known to infect humans.  If clinically indicated additional testing with an alternate test  methodology [phone]) is advised. The SARS-CoV-2 RNA is generally  detectable in upper and lower respiratory sp ecimens during the acute  phase of infection. The expected result is Negative. Fact Sheet for Patients:  StrictlyIdeas.no Fact Sheet for Healthcare Providers: BankingDealers.co.za This test is not yet approved or cleared by the [place] FDA and has been authorized for detection and/or diagnosis of SARS-CoV-2 by FDA under an Emergency Use Authorization (EUA).  This EUA will remain in effect (meaning this test can be used) for the duration of the COVID-19 declaration under Section 564(b)(1) of the Act, 21 U.S.C. section 360bbb-3(b)(1), unless the authorization is terminated or revoked sooner. Performed at [facility] [place], [place], [place] [address]   SARS CORONAVIRUS 2 (TAT 6-24 HRS) Nasopharyngeal Nasopharyngeal Swab     Status: None   Collection Time: 06/04/19  9:09 AM   Specimen: Nasopharyngeal Swab  Result Value Ref Range Status   SARS Coronavirus 2 NEGATIVE NEGATIVE Final    Comment: (NOTE) SARS-CoV-2 target nucleic acids are NOT DETECTED. The SARS-CoV-2 RNA is generally detectable in upper and lower respiratory specimens during the acute phase of infection. Negative results do not preclude SARS-CoV-2 infection, do not rule out co-infections with other pathogens, and should not be used as the sole basis for treatment or other patient  management decisions. Negative results must be combined with clinical observations, patient history, and epidemiological information. The expected result is Negative. Fact Sheet for Patients: SugarRoll.be Fact Sheet for Healthcare Providers: https://www.woods-mathews.com/ This test is not yet approved or cleared by the [place] FDA and  has been authorized for detection and/or diagnosis of SARS-CoV-2 by FDA under an Emergency Use Authorization (EUA). This EUA will remain  in effect (meaning this test can be used) for the duration of the COVID-19 declaration under Section 56 4(b)(1) of the Act, 21 U.S.C. section 360bbb-3(b)(1), unless the authorization is terminated or revoked sooner. Performed at [facility], [place] [address], [place],  [address]   Respiratory Panel by PCR     Status: Abnormal   Collection Time: 06/04/19  9:09 AM   Specimen: Nasopharyngeal Swab; Respiratory  Result Value Ref Range Status   Adenovirus NOT DETECTED NOT DETECTED Final   Coronavirus 229E NOT DETECTED NOT DETECTED Final    Comment: (NOTE) The Coronavirus on the Respiratory Panel, DOES NOT test for the novel  Coronavirus (2019 nCoV)    Coronavirus HKU1 NOT DETECTED NOT DETECTED Final   Coronavirus NL63 NOT DETECTED NOT DETECTED Final   Coronavirus OC43 NOT DETECTED NOT DETECTED Final   Metapneumovirus NOT DETECTED NOT DETECTED Final   Rhinovirus / Enterovirus DETECTED (A) NOT DETECTED Final   Influenza A NOT DETECTED NOT DETECTED Final   Influenza B NOT DETECTED NOT DETECTED Final   Parainfluenza Virus 1 NOT DETECTED NOT DETECTED Final   Parainfluenza Virus 2 NOT DETECTED NOT DETECTED Final   Parainfluenza Virus 3 NOT DETECTED NOT DETECTED Final   Parainfluenza Virus 4 NOT DETECTED NOT DETECTED Final   Respiratory Syncytial Virus NOT DETECTED NOT DETECTED Final   Bordetella pertussis NOT DETECTED NOT DETECTED Final   Chlamydophila pneumoniae  NOT DETECTED NOT DETECTED Final  Mycoplasma pneumoniae NOT DETECTED NOT DETECTED Final    Comment: Performed at [facility], [place] [address], [place], [place] [address]    RADIOLOGY:  No results found.  Follow up with PCP in 1 week.  Management plans discussed with the patient, family and they are in agreement.  CODE STATUS: Full code    Code Status Orders  (From admission, onward)         Start     Ordered   06/02/19 0011  Full code  Continuous     06/02/19 0010        Code Status History    Date Active Date Inactive Code Status Order ID Comments User Context   05/19/2019 1159 05/26/2019 2135 Full Code [id number]  Hillary Bow, MD ED   02/23/2019 1918 02/25/2019 1621 Full Code [id number]  Lang Snow, NP ED   04/18/2018 0209 04/20/2018 1930 Full Code [id number]  Arta Silence, MD Inpatient   09/05/2016 0305 09/11/2016 1601 Full Code [id number]  Hugelmeyer, Ubaldo Glassing, DO Inpatient   Advance Care Planning Activity      TOTAL TIME TAKING CARE OF THIS PATIENT ON DAY OF DISCHARGE: more than 40 minutes.   Saundra Shelling M.D on 06/08/2019 at 12:02 PM  Between 7am to 6pm - Pager - [phone]  After 6pm go to [email or web address] - password [email or web address] [place] [facility]  Office  [phone]  CC: Primary care physician; [facility]  Note: This dictation was prepared with Dragon dictation along with smaller phrase technology. Any transcriptional errors that result from this process are unintentional.

## 2019-06-08 NOTE — Progress Notes (Signed)
[place] at [place] NAME: Julie Bender    MR#:  [id number]  DATE OF BIRTH:  05-Dec-1960  SUBJECTIVE:  Patient seen and evaluated today Has generalized weakness Complaints of shortness of breath on exertion Has wheezing No chest pain   REVIEW OF SYSTEMS:  CONSTITUTIONAL: No fever, has fatigue and weakness.  EYES: No blurred or double vision.  EARS, NOSE, AND THROAT: No tinnitus or ear pain.  RESPIRATORY: Cough is better but reports shortness of breath with exertion,  denies  hemoptysis.  CARDIOVASCULAR: No chest pain, orthopnea, edema.  GASTROINTESTINAL: No nausea, vomiting, diarrhea or abdominal pain.  GENITOURINARY: No dysuria, hematuria.  ENDOCRINE: No polyuria, nocturia,  HEMATOLOGY: No anemia, easy bruising or bleeding SKIN: No rash or lesion. MUSCULOSKELETAL: No joint pain or arthritis.   NEUROLOGIC: No tingling, numbness, weakness.  PSYCHIATRY: Feels very anxious  DRUG ALLERGIES:  No Known Allergies  VITALS:  Blood pressure (!) 150/61, pulse (!) 55, temperature 98.8 F (37.1 C), temperature source Oral, resp. rate 18, height 5\' 4"  (1.626 m), weight 99.6 kg, SpO2 96 %.  PHYSICAL EXAMINATION:  GENERAL:  58 y.o.-year-old patient lying in the bed with no acute distress.  EYES: Pupils equal, round, reactive to light and accommodation. No scleral icterus. Extraocular muscles intact.  HEENT: Head atraumatic, normocephalic. Oropharynx and nasopharynx clear.  NECK:  Supple, no jugular venous distention. No thyroid enlargement, no tenderness.  LUNGS: Moderate breath sounds bilaterally with no wheezing, no rales,rhonchi or crepitation. No use of accessory muscles of respiration.  CARDIOVASCULAR: S1, S2 normal. No murmurs, rubs, or gallops.  ABDOMEN: Soft, nontender, nondistended. Bowel sounds present.  Abdominal swelling EXTREMITIES: No pedal edema, cyanosis, or clubbing.  NEUROLOGIC: Cranial nerves II through XII are intact.  Muscle strength 5/5 in all extremities. Sensation intact. Gait not checked.  PSYCHIATRIC: The patient is alert and oriented x 3.  SKIN: No obvious rash, lesion, or ulcer.    LABORATORY PANEL:   CBC Recent Labs  Lab 06/02/19 0449  WBC 7.3  HGB 10.2*  HCT 32.5*  PLT 301   ------------------------------------------------------------------------------------------------------------------  Chemistries  Recent Labs  Lab 06/01/19 1814  06/06/19 0355 06/06/19 1522 06/08/19 0532  NA 139   < > 137  --   --   K 4.3   < > 4.5  --   --   CL 106   < > 101  --   --   CO2 27   < > 25  --   --   GLUCOSE 109*   < > 160*  --   --   BUN 21*   < > 34*  --   --   CREATININE 1.16*   < > 0.81  --  0.97  CALCIUM 8.6*   < > 9.7  --   --   MG  --    < > 2.4 2.3  --   AST 15  --   --   --   --   ALT 11  --   --   --   --   ALKPHOS 66  --   --   --   --   BILITOT 0.5  --   --   --   --    < > = values in this interval not displayed.   ------------------------------------------------------------------------------------------------------------------  Cardiac Enzymes No results for input(s): TROPONINI in the last 168 hours. ------------------------------------------------------------------------------------------------------------------  RADIOLOGY:  No results found.  EKG:  Orders placed or performed during the hospital encounter of 06/01/19  . EKG 12-Lead  . EKG 12-Lead  . EKG 12-Lead  . EKG 12-Lead    ASSESSMENT AND PLAN:   #Acute respiratory distress secondary to acute rhinovirus infection, CHF with hypertensive heart disease, reactive airway disease exacerbation, hypertrophic obstructive cardiomyopathy Of the Lasix drip Currently on p.o. Lasix monitor daily weights, intake and output Consulted primary cardiology group-[facility], appreciate cardiology.  Considering right heart catheterization once patient is clinically better, follow-up with cardiology Metoprolol 25mg  bid Diltiazem 60  mg every 8 hours is added to the regimen COVID-19 is negative, repeat Covid test is negative and respiratory panel is positive for rhinovirus  Clinically improving slowly  #Acute reactive airway disease-with acute rhinovirus infection  Switch to oral steroids from IV Solu-Medrol  given 1 dose of magnesium 2 g IV on 06/03/2019, inhalers and supportive treatment as needed DuoNebs every 6 hours, albuterol as needed Antitussives added to the regimen.  Patient is on Tussionex, Tessalon Perles scheduled and Robitussin as needed Pulmonology consult placed-Dr. Lanney Gins has seen the patient appreciate his recommendations.  Recommending right heart catheterization for full evaluation and treatment will be done as outpatient as per cardiology and pulmonary attending Repeat Covid test is negative  #Obstructive sleep apnea Needs CPAP nightly, patient is not wearing consistently Discussed compliance with the patient  #Nonsustained V. tach 7 beats on 06/05/2019 and PSVT on 70 beats Continue close monitoring EKG no arrhythmias noticed, currently patient is in normal sinus rhythm Potassium is normal check magnesium Cardiology is aware and no interventions recommended at this time  #Anxiety Xanax as needed.  Celexa at home medication is continue Psychiatry follow-up appreciated and medications changes made  #Small pericardial effusion Small pericardial effusion noticed on the CT chest, groundglass opacities are improving when compared to the previous scan Discussed with  Dr. Fletcher Anon, he will follow up the patient and not suggesting echocardiogram as the effusion is very small  #Constipation Dulcolax suppository, MiraLAX  #Obstructive sleep apnea CPAP nightly  #Diabetes mellitus sliding scale insulin and monitor sugars closely while patient is on steroids NovoLog 12 units 3 times daily with meals Levemir 42 units subcu once daily-her home dose  #Essential hypertension blood pressure is  elevated Patient is on Cardizem 30 mg every 8 hours, metoprolol 25 mg and Lasix Cardizem dose increased to 60 mg every 8 hours  #Insomnia patient is requesting Ambien will provide the same  #Ambulatory dysfunction PT evaluation appreciated SNF recommended Family does not want rehab currently After clinical improvement patient wants to go home with home health services  All the records are reviewed and case discussed with Care Management/Social Workerr. Management plans discussed with the patient, she is in agreement.  CODE STATUS: Full code  TOTAL TIME TAKING CARE OF THIS PATIENT: 33 minutes.   POSSIBLE D/C IN 2 DAYS, DEPENDING ON CLINICAL CONDITION.  Note: This dictation was prepared with Dragon dictation along with smaller phrase technology. Any transcriptional errors that result from this process are unintentional.   Saundra Shelling M.D on 06/08/2019 at 1:33 PM  Between 7am to 6pm - Pager - [phone] After 6pm go to [email or web address] - password [email or web address] [facility]  [place] [facility]  Office  [phone]  CC: Primary care physician; [facility] [place]

## 2019-06-08 NOTE — Progress Notes (Signed)
Pulmonary Medicine          Date: 06/08/2019,   MRN# [id number] Julie Bender 08-18-1960     AdmissionWeight: 96.2 kg                 CurrentWeight: 99.6 kg      CHIEF COMPLAINT:   Severe shortness of breath    SUBJECTIVE   Clinically improved slept better last night.   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   Patient states she does not want to leave since she is still with SOB. She complained of bruise on her back, looks like shape or remote control she slept on.  Patient is getting narcotics and xanax.  zdyi Pait   PAST MEDICAL HISTORY   Past Medical History:  Diagnosis Date   CHF (congestive heart failure) ([place])    COPD (chronic obstructive pulmonary disease) ([place])    Diabetes mellitus without complication ([place])    Diabetic retinopathy ([place])    History of placement of internal cardiac defibrillator    Hypertension    Hypertrophic cardiomyopathy ([place])      SURGICAL HISTORY   Past Surgical History:  Procedure Laterality Date   CARDIAC DEFIBRILLATOR PLACEMENT     CHOLECYSTECTOMY       FAMILY HISTORY   Family History  Problem Relation Age of Onset   Hypertrophic cardiomyopathy Sister      SOCIAL HISTORY   Social History   Tobacco Use   Smoking status: Passive Smoke Exposure - Never Smoker   Smokeless tobacco: Never Used  Substance Use Topics   Alcohol use: No   Drug use: Never     MEDICATIONS    Home Medication:    Current Medication:  Current Facility-Administered Medications:    acetaminophen (TYLENOL) tablet 650 mg, 650 mg, Oral, Q6H PRN, 650 mg at 06/07/19 2113 **OR** acetaminophen (TYLENOL) suppository 650 mg, 650 mg, Rectal, Q6H PRN, Lance Coon, MD   albuterol (PROVENTIL) (2.5 MG/3ML) 0.083% nebulizer solution 2.5 mg, 2.5 mg, Nebulization, Q2H PRN, Gouru, Aruna, MD   ALPRAZolam Duanne Moron) tablet 0.5 mg, 0.5 mg, Oral, Q4H PRN, Cristofano, Paul A, MD, 0.5 mg at 06/08/19 1216   aspirin EC tablet  81 mg, 81 mg, Oral, Daily, Lance Coon, MD, 81 mg at 06/08/19 0911   atorvastatin (LIPITOR) tablet 40 mg, 40 mg, Oral, q1800, Lance Coon, MD, 40 mg at 06/07/19 1844   benzonatate (TESSALON) capsule 200 mg, 200 mg, Oral, TID, Gouru, Aruna, MD, 200 mg at 06/08/19 0911   bisacodyl (DULCOLAX) suppository 10 mg, 10 mg, Rectal, Daily PRN, Gouru, Aruna, MD, 10 mg at 06/03/19 1711   chlorpheniramine-HYDROcodone (TUSSIONEX) 10-8 MG/5ML suspension 5 mL, 5 mL, Oral, Q6H, Joselyne Spake, MD, 5 mL at 06/08/19 1211   citalopram (CELEXA) tablet 40 mg, 40 mg, Oral, Daily, Cristofano, Paul A, MD, 40 mg at 06/08/19 0910   diltiazem (CARDIZEM) tablet 60 mg, 60 mg, Oral, Q8H, Gouru, Aruna, MD, 60 mg at 06/08/19 0612   enoxaparin (LOVENOX) injection 40 mg, 40 mg, Subcutaneous, Q24H, Lance Coon, MD, 40 mg at 06/08/19 [id number]   guaiFENesin-dextromethorphan (ROBITUSSIN DM) 100-10 MG/5ML syrup 10 mL, 10 mL, Oral, Q4H PRN, Gouru, Aruna, MD, 10 mL at 06/06/19 0052   hydrALAZINE (APRESOLINE) injection 10 mg, 10 mg, Intravenous, Q4H PRN, Lance Coon, MD, 10 mg at 06/05/19 2043   insulin aspart (novoLOG) injection 0-15 Units, 0-15 Units, Subcutaneous, TID AC & HS, Lance Coon, MD, 11 Units at 06/08/19 1212   insulin  aspart (novoLOG) injection 12 Units, 12 Units, Subcutaneous, TID WC, Gouru, Aruna, MD, 12 Units at 06/08/19 1212   insulin detemir (LEVEMIR) injection 42 Units, 42 Units, Subcutaneous, Daily, Gouru, Aruna, MD, 42 Units at 06/08/19 0909   ipratropium-albuterol (DUONEB) 0.5-2.5 (3) MG/3ML nebulizer solution 3 mL, 3 mL, Inhalation, Q6H, Gouru, Aruna, MD, 3 mL at 06/08/19 1341   magnesium sulfate IVPB 2 g 50 mL, 2 g, Intravenous, Once, Gouru, Aruna, MD, Stopped at 06/05/19 1041   metoprolol tartrate (LOPRESSOR) tablet 25 mg, 25 mg, Oral, BID, End, Harrell Gave, MD, 25 mg at 06/08/19 0911   ondansetron (ZOFRAN) tablet 4 mg, 4 mg, Oral, Q6H PRN **OR** ondansetron (ZOFRAN) injection 4 mg, 4 mg,  Intravenous, Q6H PRN, Lance Coon, MD   oxyCODONE (Oxy IR/ROXICODONE) immediate release tablet 5 mg, 5 mg, Oral, Q6H PRN, Lance Coon, MD, 5 mg at 06/05/19 2043   pantoprazole (PROTONIX) EC tablet 40 mg, 40 mg, Oral, BID AC, Lance Coon, MD, 40 mg at 06/08/19 0911   phenol (CHLORASEPTIC) mouth spray 1 spray, 1 spray, Mouth/Throat, PRN, Gouru, Aruna, MD, 1 spray at 06/07/19 0950   polyethylene glycol (MIRALAX / GLYCOLAX) packet 17 g, 17 g, Oral, Daily, Gouru, Aruna, MD, 17 g at 06/07/19 [id number]   predniSONE (DELTASONE) tablet 50 mg, 50 mg, Oral, Daily, Pyreddy, Pavan, MD   sodium chloride flush (NS) 0.9 % injection 3 mL, 3 mL, Intravenous, Q12H, Gouru, Aruna, MD, 3 mL at 06/08/19 0912   sodium chloride flush (NS) 0.9 % injection 3 mL, 3 mL, Intravenous, PRN, Gouru, Aruna, MD, 3 mL at 06/03/19 1705   sodium phosphate (FLEET) 7-19 GM/118ML enema 1 enema, 1 enema, Rectal, Daily PRN, Gouru, Aruna, MD   torsemide (DEMADEX) tablet 60 mg, 60 mg, Oral, Daily, Gollan, Kathlene November, MD, 60 mg at 06/08/19 [id number]    ALLERGIES   Patient has no known allergies.     REVIEW OF SYSTEMS    Review of Systems:  Gen:  Denies  fever, sweats, chills weigh loss  HEENT: Denies blurred vision, double vision, ear pain, eye pain, hearing loss, nose bleeds, sore throat Cardiac:  No dizziness, chest pain or heaviness, chest tightness,edema Resp:   Denies cough or sputum porduction, shortness of breath,wheezing, hemoptysis,  Gi: Denies swallowing difficulty, stomach pain, nausea or vomiting, diarrhea, constipation, bowel incontinence Gu:  Denies bladder incontinence, burning urine Ext:   Denies Joint pain, stiffness or swelling Skin: Denies  skin rash, easy bruising or bleeding or hives Endoc:  Denies polyuria, polydipsia , polyphagia or weight change Psych:   Denies depression, insomnia or hallucinations   Other:  All other systems negative   VS: BP (!) 150/61 (BP Location: Right Arm)    Pulse (!) 55     Temp 98.8 F (37.1 C) (Oral)    Resp 18    Ht 5\' 4"  (1.626 m)    Wt 99.6 kg    SpO2 97%    BMI 37.69 kg/m      PHYSICAL EXAM    GENERAL:NAD, no fevers, chills, no weakness no fatigue HEAD: Normocephalic, atraumatic.  EYES: Pupils equal, round, reactive to light. Extraocular muscles intact. No scleral icterus.  MOUTH: Moist mucosal membrane. Dentition intact. No abscess noted.  EAR, NOSE, THROAT: Clear without exudates. No external lesions.  NECK: Supple. No thyromegaly. No nodules. No JVD.  PULMONARY: mild wheezing  CARDIOVASCULAR: S1 and S2. Regular rate and rhythm. No murmurs, rubs, or gallops. No edema. Pedal pulses 2+ bilaterally.  GASTROINTESTINAL: Soft, nontender,  nondistended. No masses. Positive bowel sounds. No hepatosplenomegaly.  MUSCULOSKELETAL: No swelling, clubbing, or edema. Range of motion full in all extremities.  NEUROLOGIC: Cranial nerves II through XII are intact. No gross focal neurological deficits. Sensation intact. Reflexes intact.  SKIN: No ulceration, lesions, rashes, or cyanosis. Skin warm and dry. Turgor intact.  PSYCHIATRIC: Mood, affect within normal limits. The patient is awake, alert and oriented x 3. Insight, judgment intact.       IMAGING    Dg Chest 2 View  Result Date: 05/19/2019 CLINICAL DATA:  Bilateral lower extremity pain. Shortness of breath. EXAM: CHEST - 2 VIEW COMPARISON:  02/23/2019 FINDINGS: There is mild bilateral interstitial thickening. There is no focal consolidation. There is no pleural effusion or pneumothorax. There is stable cardiomegaly. There is a dual lead cardiac pacemaker. There is no acute osseous abnormality. IMPRESSION: Cardiomegaly with mild pulmonary vascular congestion. Electronically Signed   By: Kathreen Devoid   On: 05/19/2019 09:22   Ct Chest Wo Contrast  Result Date: 06/04/2019 CLINICAL DATA:  Cough and shortness of breath. EXAM: CT CHEST WITHOUT CONTRAST TECHNIQUE: Multidetector CT imaging of the chest was  performed following the standard protocol without IV contrast. COMPARISON:  05/19/2019 FINDINGS: Cardiovascular: Intracardiac AICD leads are unchanged. The heart remains mildly enlarged. Interval small pericardial effusion with a maximum thickness of 7 mm. The main pulmonary artery remains dilated with a transverse diameter of 3.8 cm. Mediastinum/Nodes: Diffusely enlarged and minimally heterogeneous thyroid gland with a curvilinear calcification in the left lobe. No enlarged lymph nodes. Lungs/Pleura: Multiple small bilateral calcified granulomata are again demonstrated. No pleural fluid. Mild linear atelectasis/scarring in the lingula with improvement. Mild bilateral bullous changes. Upper Abdomen: Cholecystectomy clips. Musculoskeletal: Mild thoracic spine degenerative changes. IMPRESSION: 1. Interval small pericardial effusion with a maximum thickness of 7 mm. 2. Stable enlarged main pulmonary artery, possibly due to pulmonary arterial hypertension. 3. Stable mild cardiomegaly and mild changes of COPD. Emphysema (ICD10-J43.9). Electronically Signed   By: Claudie Revering M.D.   On: 06/04/2019 09:42   Ct Angio Chest Pe W And/or Wo Contrast  Result Date: 05/19/2019 CLINICAL DATA:  Bilateral lower extremity pain and shortness of breath. EXAM: CT ANGIOGRAPHY CHEST CT ABDOMEN AND PELVIS WITH CONTRAST TECHNIQUE: Multidetector CT imaging of the chest was performed using the standard protocol during bolus administration of intravenous contrast. Multiplanar CT image reconstructions and MIPs were obtained to evaluate the vascular anatomy. Multidetector CT imaging of the abdomen and pelvis was performed using the standard protocol during bolus administration of intravenous contrast. CONTRAST:  168mL OMNIPAQUE IOHEXOL 350 MG/ML SOLN COMPARISON:  CT dated 04/18/2018 FINDINGS: CTA CHEST FINDINGS Cardiovascular: Contrast injection is sufficient to demonstrate satisfactory opacification of the pulmonary arteries to the segmental  level. There is no pulmonary embolus. The main pulmonary artery is dilated measuring approximately 3.8 cm in diameter. There is no CT evidence of acute right heart strain. There are mild atherosclerotic changes of the visualized thoracic aorta. Heart size is enlarged. There is significant wall thickening of the left ventricle. 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 There is no significant pericardial effusion. Mediastinum/Nodes: --No mediastinal or hilar lymphadenopathy. --No axillary lymphadenopathy. --No supraclavicular lymphadenopathy. --a large left-sided thyroid nodule is again noted. --The esophagus is unremarkable Lungs/Pleura: The lung volumes are low. The trachea is unremarkable. There is a trace right-sided pleural effusion. There is no pneumothorax or focal infiltrate. There is a small amount of atelectasis in the lingula. Musculoskeletal: No chest wall abnormality. No acute or significant  osseous findings. Review of the MIP images confirms the above findings. CT ABDOMEN and PELVIS FINDINGS Hepatobiliary: The liver is normal. Status post cholecystectomy.There is no biliary ductal dilation. Pancreas: Normal contours without ductal dilatation. No peripancreatic fluid collection. Spleen: No splenic laceration or hematoma. Adrenals/Urinary Tract: --Adrenal glands: No adrenal hemorrhage. --Right kidney/ureter: No hydronephrosis or perinephric hematoma. --Left kidney/ureter: No hydronephrosis or perinephric hematoma. --Urinary bladder: Unremarkable. Stomach/Bowel: --Stomach/Duodenum: No hiatal hernia or other gastric abnormality. Normal duodenal course and caliber. --Small bowel: No dilatation or inflammation. --Colon: No focal abnormality. --Appendix: Normal. Vascular/Lymphatic: Atherosclerotic calcification is present within the non-aneurysmal abdominal aorta, without hemodynamically significant stenosis. --No retroperitoneal lymphadenopathy. --No mesenteric lymphadenopathy. --No pelvic or inguinal  lymphadenopathy. Reproductive: Unremarkable Other: There is a partially visualized 1.7 x 1.2 cm subcutaneous nodule in the left gluteal fold. The abdominal wall is normal. Musculoskeletal. No acute displaced fractures. Review of the MIP images confirms the above findings. IMPRESSION: 1. No acute thoracic, abdominal or pelvic injury. 2. Cardiomegaly with significant wall thickening of the left ventricle. 3. Trace right-sided pleural effusion. 4. Dilated main pulmonary artery which can be seen in patients with elevated pulmonary artery pressures. 5. Partially visualized 1.7 x 1.2 cm subcutaneous nodule in the left gluteal fold. Recommend correlation with physical examination. This could represent a sebaceous cyst. Aortic Atherosclerosis (ICD10-I70.0). Electronically Signed   By: Constance Holster M.D.   On: 05/19/2019 11:06   Ct Abdomen Pelvis W Contrast  Result Date: 05/19/2019 CLINICAL DATA:  Bilateral lower extremity pain and shortness of breath. EXAM: CT ANGIOGRAPHY CHEST CT ABDOMEN AND PELVIS WITH CONTRAST TECHNIQUE: Multidetector CT imaging of the chest was performed using the standard protocol during bolus administration of intravenous contrast. Multiplanar CT image reconstructions and MIPs were obtained to evaluate the vascular anatomy. Multidetector CT imaging of the abdomen and pelvis was performed using the standard protocol during bolus administration of intravenous contrast. CONTRAST:  139mL OMNIPAQUE IOHEXOL 350 MG/ML SOLN COMPARISON:  CT dated 04/18/2018 FINDINGS: CTA CHEST FINDINGS Cardiovascular: Contrast injection is sufficient to demonstrate satisfactory opacification of the pulmonary arteries to the segmental level. There is no pulmonary embolus. The main pulmonary artery is dilated measuring approximately 3.8 cm in diameter. There is no CT evidence of acute right heart strain. There are mild atherosclerotic changes of the visualized thoracic aorta. Heart size is enlarged. There is significant  wall thickening of the left ventricle. 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 There is no significant pericardial effusion. Mediastinum/Nodes: --No mediastinal or hilar lymphadenopathy. --No axillary lymphadenopathy. --No supraclavicular lymphadenopathy. --a large left-sided thyroid nodule is again noted. --The esophagus is unremarkable Lungs/Pleura: The lung volumes are low. The trachea is unremarkable. There is a trace right-sided pleural effusion. There is no pneumothorax or focal infiltrate. There is a small amount of atelectasis in the lingula. Musculoskeletal: No chest wall abnormality. No acute or significant osseous findings. Review of the MIP images confirms the above findings. CT ABDOMEN and PELVIS FINDINGS Hepatobiliary: The liver is normal. Status post cholecystectomy.There is no biliary ductal dilation. Pancreas: Normal contours without ductal dilatation. No peripancreatic fluid collection. Spleen: No splenic laceration or hematoma. Adrenals/Urinary Tract: --Adrenal glands: No adrenal hemorrhage. --Right kidney/ureter: No hydronephrosis or perinephric hematoma. --Left kidney/ureter: No hydronephrosis or perinephric hematoma. --Urinary bladder: Unremarkable. Stomach/Bowel: --Stomach/Duodenum: No hiatal hernia or other gastric abnormality. Normal duodenal course and caliber. --Small bowel: No dilatation or inflammation. --Colon: No focal abnormality. --Appendix: Normal. Vascular/Lymphatic: Atherosclerotic calcification is present within the non-aneurysmal abdominal aorta, without hemodynamically significant stenosis. --No  retroperitoneal lymphadenopathy. --No mesenteric lymphadenopathy. --No pelvic or inguinal lymphadenopathy. Reproductive: Unremarkable Other: There is a partially visualized 1.7 x 1.2 cm subcutaneous nodule in the left gluteal fold. The abdominal wall is normal. Musculoskeletal. No acute displaced fractures. Review of the MIP images confirms the above findings. IMPRESSION: 1. No acute  thoracic, abdominal or pelvic injury. 2. Cardiomegaly with significant wall thickening of the left ventricle. 3. Trace right-sided pleural effusion. 4. Dilated main pulmonary artery which can be seen in patients with elevated pulmonary artery pressures. 5. Partially visualized 1.7 x 1.2 cm subcutaneous nodule in the left gluteal fold. Recommend correlation with physical examination. This could represent a sebaceous cyst. Aortic Atherosclerosis (ICD10-I70.0). Electronically Signed   By: Constance Holster M.D.   On: 05/19/2019 11:06   Dg Chest Portable 1 View  Result Date: 06/01/2019 CLINICAL DATA:  Cough, body aches and fatigue for 5 days EXAM: PORTABLE CHEST 1 VIEW COMPARISON:  Radiograph 05/25/2019, CT angio 05/19/2019 FINDINGS: Hazy interstitial opacities throughout the lungs with a basilar predominance are mild central venous congestion with cardiomegaly which is similar to comparison exam. Dual lead pacer pack overlies the left chest wall with the lead at the cardiac apex and right atrium. No pneumothorax. No visible effusion. No acute osseous or soft tissue abnormality. IMPRESSION: 1. Unchanged bilateral diffuse interstitial opacities and central venous congestion likely reflecting some interstitial edema. 2. Stable cardiomegaly. Electronically Signed   By: Lovena Le M.D.   On: 06/01/2019 17:04   Dg Chest Port 1 View  Result Date: 05/25/2019 CLINICAL DATA:  Shortness of breath on exertion EXAM: PORTABLE CHEST 1 VIEW COMPARISON:  Chest radiograph dated 05/19/2019 and CT chest dated 05/19/2019. FINDINGS: A left subclavian approach cardiac device is redemonstrated. A 2.3 cm long radiopacity overlies the cardiac silhouette and may be external to the patient. The heart remains enlarged. Vascular calcifications are seen in the aortic arch. There is a mild diffuse bilateral interstitial opacities which are not significantly changed. There is no pleural effusion or pneumothorax. IMPRESSION: 1. No  significant interval change in the mild diffuse interstitial opacities. 2. A 2.3 cm long radiopacity overlies the cardiac silhouette and may be external to the patient. 3. Stable cardiomegaly. Electronically Signed   By: Zerita Boers M.D.   On: 05/25/2019 11:00             ASSESSMENT/PLAN    SOB and cough due to Rhinovirus infection  Complicated by hypertrophic cardiomyopathy and PAH  - reviewed serial CT chest - diffuse GGO is improved from previous done 2 weeks ago  - she does feel relief from albuterol  - patient endorses some symptoms of anxiety- s/p psych evaluation.  -PH is likely pre and post capillary Group 2 and 1 - she is currently NYHA/WHO 3-4 - would recommend RHC for full evaluation and tx , can be done on outpatient -patient is a never smoker   Obstructive sleep apnea diagnostic sleep study 10/02/2017>>  PLM index of 132, PLM arousal index of 13.7.  Mild sleep apnea with an AHI of 7.1.  CPAP titration study was recommended as well as work-up for periodic limb movement disorder. CPAP titration study 12/05/2017>> PLM index 65, PLM arousal index of 2.  Hypopnea index remained elevated above 10 and all pressures until a pressure of 10 was achieved and AHI was 0. CPAP of 10 was recommended.  -patient did not wear consistently.        Thank you for allowing me to participate in the care  of this patient.    Patient/Family are satisfied with care plan and all questions have been answered.  This document was prepared using Dragon voice recognition software and may include unintentional dictation errors.     Ottie Glazier, M.D.  Division of [facility]

## 2019-06-09 LAB — GLUCOSE, CAPILLARY
Glucose-Capillary: 86 mg/dL (ref 70–99)
Glucose-Capillary: 207 mg/dL — ABNORMAL HIGH (ref 70–99)

## 2019-06-09 MED ORDER — IPRATROPIUM-ALBUTEROL 0.5-2.5 (3) MG/3ML IN SOLN
3.0000 mL | Freq: Three times a day (TID) | RESPIRATORY_TRACT | Status: DC
  Administered 2019-06-09: 3 mL via RESPIRATORY_TRACT
  Filled 2019-06-09: qty 3

## 2019-06-09 NOTE — TOC Transition Note (Signed)
Transition of Care [facility]) - CM/SW Discharge Note   Patient Details  Name: Julie Bender MRN: [id number] Date of Birth: 1961/05/20  Transition of Care [facility] CM/SW Contact:  Weston Anna, LCSW Phone Number: 06/09/2019, 1:55 PM   Clinical Narrative:     Patient set to discharge home wit [facility] following- patient is already active with them from previous admission. Notified Cherly with [place] agency and she will follow up with family.   Final next level of care: [place] Barriers to Discharge: No Barriers Identified   Patient Goals and CMS Choice Patient states their goals for this hospitalization and ongoing recovery are:: Wants her breathing to get better - feels like she is going to take her last breath every time she breaths in CMS Medicare.gov Compare Post Acute Care list provided to:: Patient Choice offered to / list presented to : Patient  Discharge Placement                       Discharge Plan and Services   Discharge Planning Services: CM Consult Post Acute Care Choice: Resumption of Svcs/PTA Provider          DME Arranged: N/A         HH Arranged: PT, RN [place] Agency: [place] Date [place]: 06/09/19 Time [place]: [id number] Representative spoke with at [place]: [place] Determinants of Health ([place]) Interventions     Readmission Risk Interventions Readmission Risk Prevention Plan 06/02/2019 04/20/2018  Transportation Screening Complete Complete  PCP or Specialist Appt within 5-7 Days - Complete  Home Care Screening - Complete  Medication Review (RN CM) - Complete  Palliative Care Screening Not Applicable -  Medication Review (RN Care Manager) Complete -  Some recent data might be hidden

## 2019-06-09 NOTE — Discharge Summary (Signed)
[facility] NAME: Julie Bender    MR#:  [id number]  DATE OF BIRTH:  [birth year]  DATE OF ADMISSION:  06/01/2019 ADMITTING PHYSICIAN: Lance Coon, MD  DATE OF DISCHARGE: 06/09/2019  PRIMARY CARE PHYSICIAN: [facility]   ADMISSION DIAGNOSIS:  Weakness [R53.1] Idiopathic hypotension [I95.0]  DISCHARGE DIAGNOSIS:   Acute respiratory distress with rhinovirus infection Hypertrophic obstructive cardiomyopathy Nonsustained ventricular tachycardia Obstructive sleep apnea SECONDARY DIAGNOSIS:   Past Medical History:  Diagnosis Date  . CHF (congestive heart failure) ([place])   . COPD (chronic obstructive pulmonary disease) ([place])   . Diabetes mellitus without complication ([place])   . Diabetic retinopathy ([place])   . History of placement of internal cardiac defibrillator   . Hypertension   . Hypertrophic cardiomyopathy [facility])      ADMITTING HISTORY Julie Bender  is a 58 y.o. female who presents with chief complaint as above.  Patient presents to the ED with a complaint of cough and increased weakness.  She was recently admitted here for heart failure and excessive fluid.    HOSPITAL COURSE:  #Acute respiratory distress secondary to acute rhinovirus infection, CHF with hypertensive heart disease, reactive airway disease exacerbation, hypertrophic obstructive cardiomyopathy -Off the Lasix drip -Currently on p.o. Lasix -Consulted primary cardiology group-[facility], appreciate cardiology.  -Considering right heart catheterization once patient is clinically better, follow-up with cardiology -Metoprolol 25mg  bid and cardizem -COVID-19 is negative, repeat Covid test is negative and respiratory panel is positive for rhinovirus   #Acute reactive airway disease-with acute rhinovirus infection  -Switch to oral steroids from IV Solu-Medrol - given 1 dose of magnesium 2 g IV on 06/03/2019, inhalers and supportive treatment as  needed -DuoNebs every 6 hours, albuterol as needed -Antitussives added to the regimen.  Patient is on Tussionex, Tessalon Perles scheduled and Robitussin as needed -Pulmonology consult placed-Dr. Lanney Gins has seen the patient appreciate his recommendations.  Recommending right heart catheterization for full evaluation and treatment will be done as outpatient as per cardiology and pulmonary attending -Repeat Covid test is negative  #Obstructive sleep apnea -Needs CPAP nightly, patient is not wearing consistently -Discussed compliance with the patient  #Nonsustained V. tach 7 beats on 06/05/2019 and PSVT on 70 beats -EKG no arrhythmias noticed, currently patient is in normal sinus rhythm -Potassium is normal check magnesium -Cardiology is aware and no interventions recommended at this time  #Anxiety -Xanax as needed.  Celexa at home medication is continue -Psychiatry follow-up appreciated and medications changes made  #Small pericardial effusion Small pericardial effusion noticed on the CT chest, groundglass opacities are improving when compared to the previous scan Discussed with  Dr. Fletcher Anon, he will follow up the patient and not suggesting echocardiogram as the effusion is very small  #Constipation Dulcolax suppository, MiraLAX  #Obstructive sleep apnea CPAP nightly  #Diabetes mellitus sliding scale insulin and monitor sugars closely while patient is on steroids NovoLog 12 units 3 times daily with meals Levemir 42 units subcu once daily-her home dose  #Essential hypertension blood pressure is elevated Patient is on Cardizem 30 mg every 8 hours, metoprolol 25 mg and Lasix Cardizem dose increased to 60 mg every 8 hours  #Insomnia patient is requesting Ambien will provide the same  #Ambulatory dysfunction PT evaluation appreciated SNF recommended--pt wans to go home   Patient is best at baseline given her multitude of medical problems. Her saturations are 97% on room  air. She is somewhat weak. Will arrange home health PT and RN.  Social worker has already made arrangements. She will discharge to home. She is in agreement.   CONSULTS OBTAINED:  Treatment Team:  Flora Lipps, MD Ottie Glazier, MD Dixie Dials, MD  DRUG ALLERGIES:  No Known Allergies  DISCHARGE MEDICATIONS:   Allergies as of 06/09/2019   No Known Allergies     Medication List    STOP taking these medications   metolazone 2.5 MG tablet Commonly known as: ZAROXOLYN   metoprolol succinate 25 MG 24 hr tablet Commonly known as: TOPROL-XL   verapamil 240 MG CR tablet Commonly known as: CALAN-SR     TAKE these medications   albuterol 108 (90 Base) MCG/ACT inhaler Commonly known as: VENTOLIN HFA Inhale 2 puffs into the lungs every 6 (six) hours as needed for wheezing or shortness of breath.   aspirin EC 81 MG tablet Take 81 mg by mouth daily.   atorvastatin 40 MG tablet Commonly known as: LIPITOR Take 1 tablet (40 mg total) by mouth daily at 6 PM. What changed: when to take this   chlorpheniramine-HYDROcodone 10-8 MG/5ML Suer Commonly known as: TUSSIONEX Take 5 mLs by mouth every 12 (twelve) hours as needed for cough.   citalopram 20 MG tablet Commonly known as: CELEXA Take 20 mg by mouth daily.   diltiazem 60 MG tablet Commonly known as: CARDIZEM Take 1 tablet (60 mg total) by mouth every 8 (eight) hours.   gabapentin 300 MG capsule Commonly known as: NEURONTIN Take 1 capsule (300 mg total) by mouth 2 (two) times daily.   insulin detemir 100 UNIT/ML injection Commonly known as: LEVEMIR Inject 42 Units into the skin at bedtime.   metFORMIN 850 MG tablet Commonly known as: GLUCOPHAGE Take 850 mg by mouth 3 (three) times daily.   metoprolol tartrate 25 MG tablet Commonly known as: LOPRESSOR Take 1 tablet (25 mg total) by mouth 2 (two) times daily.   nitroGLYCERIN 0.4 MG SL tablet Commonly known as: NITROSTAT Place 1 tablet (0.4 mg total) under  the tongue every 5 (five) minutes as needed for chest pain.   NovoLOG FlexPen 100 UNIT/ML FlexPen Generic drug: insulin aspart Inject 13 Units into the skin 3 (three) times daily with meals.   oxyCODONE 5 MG immediate release tablet Commonly known as: Oxy IR/ROXICODONE Take 1 tablet (5 mg total) by mouth every 6 (six) hours as needed for moderate pain.   pantoprazole 40 MG tablet Commonly known as: PROTONIX Take 1 tablet (40 mg total) by mouth 2 (two) times daily before a meal.   polyethylene glycol 17 g packet Commonly known as: MIRALAX / GLYCOLAX Take 17 g by mouth daily.   predniSONE 10 MG tablet Commonly known as: DELTASONE Take 1 tablet (10 mg total) by mouth daily. Label  & dispense according to the schedule below.  6 tablets day one, then 5 table day 2, then 4 tablets day 3, then 3 tablets day 4, 2 tablets day 5, then 1 tablet day 6, then stop   torsemide 20 MG tablet Commonly known as: DEMADEX Take 3 tablets (60 mg total) by mouth daily. What changed:   how much to take  when to take this   traZODone 150 MG tablet Commonly known as: DESYREL Take 150 mg by mouth at bedtime.       Today  Patient seen today Decrease shortness of breath Tolerating diet well Hemodynamically stable VITAL SIGNS:  Blood pressure 134/83, pulse 66, temperature 98.2 F (36.8 C), resp. rate 19, height 5\' 4"  (1.626 m),  weight 99.6 kg, SpO2 97 %.  I/O:    Intake/Output Summary (Last 24 hours) at 06/09/2019 1404 Last data filed at 06/09/2019 0931 Gross per 24 hour  Intake 120 ml  Output -  Net 120 ml    PHYSICAL EXAMINATION:  Physical Exam  GENERAL:  58 y.o.-year-old patient lying in the bed with no acute distress.  LUNGS: Normal breath sounds bilaterally, no wheezing, rales,rhonchi or crepitation. No use of accessory muscles of respiration.  CARDIOVASCULAR: S1, S2 normal. No murmurs, rubs, or gallops.  ABDOMEN: Soft, non-tender, non-distended. Bowel sounds present. No  organomegaly or mass.  NEUROLOGIC: Moves all 4 extremities. PSYCHIATRIC: The patient is alert and oriented x 3.  SKIN: No obvious rash, lesion, or ulcer.   DATA REVIEW:   CBC No results for input(s): WBC, HGB, HCT, PLT in the last 168 hours.  Chemistries  Recent Labs  Lab 06/06/19 0355 06/06/19 1522 06/08/19 0532  NA 137  --   --   K 4.5  --   --   CL 101  --   --   CO2 25  --   --   GLUCOSE 160*  --   --   BUN 34*  --   --   CREATININE 0.81  --  0.97  CALCIUM 9.7  --   --   MG 2.4 2.3  --     Cardiac Enzymes No results for input(s): TROPONINI in the last 168 hours.  Microbiology Results  Results for orders placed or performed during the hospital encounter of 06/01/19  SARS Coronavirus 2 by RT PCR (hospital order, performed in [facility] hospital lab) Nasopharyngeal Nasopharyngeal Swab     Status: None   Collection Time: 06/01/19  6:18 PM   Specimen: Nasopharyngeal Swab  Result Value Ref Range Status   SARS Coronavirus 2 NEGATIVE NEGATIVE Final    Comment: (NOTE) If result is NEGATIVE SARS-CoV-2 target nucleic acids are NOT DETECTED. The SARS-CoV-2 RNA is generally detectable in upper and lower  respiratory specimens during the acute phase of infection. The lowest  concentration of SARS-CoV-2 viral copies this assay can detect is 250  copies / mL. A negative result does not preclude SARS-CoV-2 infection  and should not be used as the sole basis for treatment or other  patient management decisions.  A negative result may occur with  improper specimen collection / handling, submission of specimen other  than nasopharyngeal swab, presence of viral mutation(s) within the  areas targeted by this assay, and inadequate number of viral copies  (<250 copies / mL). A negative result must be combined with clinical  observations, patient history, and epidemiological information. If result is POSITIVE SARS-CoV-2 target nucleic acids are DETECTED. The SARS-CoV-2 RNA is  generally detectable in upper and lower  respiratory specimens dur ing the acute phase of infection.  Positive  results are indicative of active infection with SARS-CoV-2.  Clinical  correlation with patient history and other diagnostic information is  necessary to determine patient infection status.  Positive results do  not rule out bacterial infection or co-infection with other viruses. If result is PRESUMPTIVE POSTIVE SARS-CoV-2 nucleic acids MAY BE PRESENT.   A presumptive positive result was obtained on the submitted specimen  and confirmed on repeat testing.  While 2019 novel coronavirus  (SARS-CoV-2) nucleic acids may be present in the submitted sample  additional confirmatory testing may be necessary for epidemiological  and / or clinical management purposes  to differentiate between  SARS-CoV-2 and  other Sarbecovirus currently known to infect humans.  If clinically indicated additional testing with an alternate test  methodology [phone]) is advised. The SARS-CoV-2 RNA is generally  detectable in upper and lower respiratory sp ecimens during the acute  phase of infection. The expected result is Negative. Fact Sheet for Patients:  StrictlyIdeas.no Fact Sheet for Healthcare Providers: BankingDealers.co.za This test is not yet approved or cleared by the [place] FDA and has been authorized for detection and/or diagnosis of SARS-CoV-2 by FDA under an Emergency Use Authorization (EUA).  This EUA will remain in effect (meaning this test can be used) for the duration of the COVID-19 declaration under Section 564(b)(1) of the Act, 21 U.S.C. section 360bbb-3(b)(1), unless the authorization is terminated or revoked sooner. Performed at [facility], [place], [place] [address]   SARS CORONAVIRUS 2 (TAT 6-24 HRS) Nasopharyngeal Nasopharyngeal Swab     Status: None   Collection Time: 06/04/19  9:09 AM    Specimen: Nasopharyngeal Swab  Result Value Ref Range Status   SARS Coronavirus 2 NEGATIVE NEGATIVE Final    Comment: (NOTE) SARS-CoV-2 target nucleic acids are NOT DETECTED. The SARS-CoV-2 RNA is generally detectable in upper and lower respiratory specimens during the acute phase of infection. Negative results do not preclude SARS-CoV-2 infection, do not rule out co-infections with other pathogens, and should not be used as the sole basis for treatment or other patient management decisions. Negative results must be combined with clinical observations, patient history, and epidemiological information. The expected result is Negative. Fact Sheet for Patients: SugarRoll.be Fact Sheet for Healthcare Providers: https://www.woods-mathews.com/ This test is not yet approved or cleared by the [place] FDA and  has been authorized for detection and/or diagnosis of SARS-CoV-2 by FDA under an Emergency Use Authorization (EUA). This EUA will remain  in effect (meaning this test can be used) for the duration of the COVID-19 declaration under Section 56 4(b)(1) of the Act, 21 U.S.C. section 360bbb-3(b)(1), unless the authorization is terminated or revoked sooner. Performed at [facility], [place] [address], [place], [place] [address]   Respiratory Panel by PCR     Status: Abnormal   Collection Time: 06/04/19  9:09 AM   Specimen: Nasopharyngeal Swab; Respiratory  Result Value Ref Range Status   Adenovirus NOT DETECTED NOT DETECTED Final   Coronavirus 229E NOT DETECTED NOT DETECTED Final    Comment: (NOTE) The Coronavirus on the Respiratory Panel, DOES NOT test for the novel  Coronavirus (2019 nCoV)    Coronavirus HKU1 NOT DETECTED NOT DETECTED Final   Coronavirus NL63 NOT DETECTED NOT DETECTED Final   Coronavirus OC43 NOT DETECTED NOT DETECTED Final   Metapneumovirus NOT DETECTED NOT DETECTED Final   Rhinovirus / Enterovirus DETECTED (A) NOT  DETECTED Final   Influenza A NOT DETECTED NOT DETECTED Final   Influenza B NOT DETECTED NOT DETECTED Final   Parainfluenza Virus 1 NOT DETECTED NOT DETECTED Final   Parainfluenza Virus 2 NOT DETECTED NOT DETECTED Final   Parainfluenza Virus 3 NOT DETECTED NOT DETECTED Final   Parainfluenza Virus 4 NOT DETECTED NOT DETECTED Final   Respiratory Syncytial Virus NOT DETECTED NOT DETECTED Final   Bordetella pertussis NOT DETECTED NOT DETECTED Final   Chlamydophila pneumoniae NOT DETECTED NOT DETECTED Final   Mycoplasma pneumoniae NOT DETECTED NOT DETECTED Final    Comment: Performed at [facility], [place]. [address] [place], [place] [address]    RADIOLOGY:  No results found.  Follow up  with PCP in 1 week.  Management plans discussed with the patient, family and they are in agreement.  CODE STATUS: Full code    Code Status Orders  (From admission, onward)         Start     Ordered   06/02/19 0011  Full code  Continuous     06/02/19 0010        Code Status History    Date Active Date Inactive Code Status Order ID Comments User Context   05/19/2019 1159 05/26/2019 2135 Full Code [id number]  Hillary Bow, MD ED   02/23/2019 1918 02/25/2019 1621 Full Code [id number]  Lang Snow, NP ED   04/18/2018 0209 04/20/2018 1930 Full Code [id number]  Arta Silence, MD Inpatient   09/05/2016 0305 09/11/2016 1601 Full Code [id number]  Hugelmeyer, Ubaldo Glassing, DO Inpatient   Advance Care Planning Activity      TOTAL TIME TAKING CARE OF THIS PATIENT ON DAY OF DISCHARGE: more than 40 minutes.   Fritzi Mandes M.D on 06/09/2019 at 2:04 PM  Between 7am to 6pm - Pager - [phone]  After 6pm go to [email or web address] - password [email or web address] [facility]  Office  [phone]  CC: Primary care physician; [facility]  Note: This dictation was prepared with Dragon dictation along with smaller phrase technology. Any transcriptional errors that result from this  process are unintentional.

## 2019-06-12 NOTE — H&P (View-Only) (Signed)
Cardiology Office Note    Date:  06/14/2019   ID:  Breyon Bapst, DOB 1961-01-14, MRN [id number]  PCP:  [facility]  Cardiologist:  Ida Rogue, MD  Electrophysiologist:  None   Chief Complaint: Hospital follow-up  History of Present Illness:   Julie Bender is a 58 y.o. female with history of hypertropic obstructive cardiomyopathy s/p ICD, nonobstructive CAD by [facility] in 03/2016, pulmonary hypertension, mitral valve regurgitation, IDDM, HTN, OSA not treated with CPAP secondary to financial issues, and obesity who presents for hospital follow up as detailed below.   Patient was previously followed by [facility].  She underwent diagnostic cath in 03/2016 that did not reveal any significant CAD with markedly elevated filling pressures and then end-diastolic pressure around 40 mmHg with a wedge pressure of 25 mmHg, mean pulmonary pressure of 41 mmHg, mid cavitary gradient 40 to 60 mmHg, no SAM.  Echo in 04/2017 while admitted to [facility] for acute on chronic diastolic CHF showed an EF greater than 70%, severe LV wall thickness, hyperdynamic LV with mid cavity obliteration, mid ventricular gradient 41 mm rate increase to 62 mmHg with Valsalva, mild RV dilatation with normal function, moderate biatrial enlargement, moderate to severe mitral regurgitation, sclerotic aortic valve, moderate tricuspid regurgitation, PASP 42 to 47 mmHg.  She has subsequently transitioned care to [facility] after admission in 04/2018 for acute on chronic diastolic CHF.  Echo during that admission showed an EF of 55 to 60%, moderate concentric LVH with the septal wall appearing to be moderate to severe with mild LVOT obstruction, normal wall motion, grade 2 diastolic dysfunction, trivial aortic regurgitation, mild to moderate mitral regurgitation, mildly dilated left atrium, normal RV systolic function.  She was readmitted to the hospital in 02/2019 with acute respiratory distress with hypoxia in the setting of COPD  exacerbation and acute on chronic diastolic CHF treated with IV steroids and IV diuresis.  She was seen by her primary cardiologist in follow-up in 02/2019 and reported chronic shortness of breath with a reported dry weight approximately 215 pounds.  ReDs vest score that day was 31%.  She reported longstanding chronic chest pain.  BP was soft in the 0000000 systolic leading her verapamil to be decreased to 240 mg daily.  She was continued on torsemide 60 mg daily.  More recently, in early 05/2019 with shortness of breath and cough consistent with COPD exacerbation with recommendation to proceed with diagnostic right and left heart cath down the road.  She was readmitted shortly after discharge in mid 05/2019 with continued shortness of breath and cough that had persisted for several weeks with concern for possible COVID-19 in spite of testing negative.  She appeared euvolemic and symptoms were not felt to be primarily cardiac in etiology.  High-sensitivity troponin minimally elevated with a peak of 90 and downtrending.  She subsequently tested positive for rhinovirus.  Echo showed a hyperdynamic LV systolic function with an EF greater than 75%, severely increased LVH, elevated mean left atrial pressure, diastolic dysfunction,, mildly dilated left atrium, degenerative mitral valve with mild mitral regurgitation, overall technically difficult study.  Plan was for right and left cardiac cath once she recovered from rhinovirus infection.  Documented discharge weight of [id number] kg with uncertain validity.  On day of discharge, it appears the patient was transitioned from Lasix 40 mg daily to torsemide 60 mg daily.  She comes in today noting improvement in her shortness of breath.  She denies any chest pain, palpitations, dizziness, presyncope, syncope.  She does note ongoing fatigue and generalized malaise.  No lower extremity swelling, abdominal distention, orthopnea, PND, or early satiety.  No device shocks.  She indicates  she has been taking 80 mg of torsemide rather than the previously prescribed 60 mg of torsemide.  No falls, BRBPR, or melena.  Appetite has been slow to return following her hospital discharge.   Labs: 05/2019 - SCr 0.97, magnesium 2.3, potassium 4.5, HGB 10.2, PLT 301, BNP 358, albumin 3.5, AST/ALT normal, TSH normal, A1c 7.2  Past Medical History:  Diagnosis Date   CHF (congestive heart failure) (HCC)    COPD (chronic obstructive pulmonary disease) (HCC)    Diabetes mellitus without complication ([place])    Diabetic retinopathy ([place])    History of placement of internal cardiac defibrillator    Hypertension    Hypertrophic cardiomyopathy ([place])     Past Surgical History:  Procedure Laterality Date   CARDIAC DEFIBRILLATOR PLACEMENT     CHOLECYSTECTOMY      Current Medications: Current Meds  Medication Sig   albuterol (PROVENTIL HFA;VENTOLIN HFA) 108 (90 Base) MCG/ACT inhaler Inhale 2 puffs into the lungs every 6 (six) hours as needed for wheezing or shortness of breath.   aspirin EC 81 MG tablet Take 81 mg by mouth daily.   atorvastatin (LIPITOR) 40 MG tablet Take 1 tablet (40 mg total) by mouth daily at 6 PM. (Patient taking differently: Take 40 mg by mouth daily. )   chlorpheniramine-HYDROcodone (TUSSIONEX) 10-8 MG/5ML SUER Take 5 mLs by mouth every 12 (twelve) hours as needed for cough.   citalopram (CELEXA) 20 MG tablet Take 20 mg by mouth daily.   diltiazem (CARDIZEM) 60 MG tablet Take 1 tablet (60 mg total) by mouth every 8 (eight) hours.   gabapentin (NEURONTIN) 300 MG capsule Take 1 capsule (300 mg total) by mouth 2 (two) times daily.   insulin aspart (NOVOLOG FLEXPEN) 100 UNIT/ML FlexPen Inject 13 Units into the skin 3 (three) times daily with meals.    insulin detemir (LEVEMIR) 100 UNIT/ML injection Inject 42 Units into the skin at bedtime.   metFORMIN (GLUCOPHAGE) 850 MG tablet Take 850 mg by mouth 3 (three) times daily.   metoprolol tartrate (LOPRESSOR)  25 MG tablet Take 1 tablet (25 mg total) by mouth 2 (two) times daily.   nitroGLYCERIN (NITROSTAT) 0.4 MG SL tablet Place 1 tablet (0.4 mg total) under the tongue every 5 (five) minutes as needed for chest pain.   oxyCODONE (OXY IR/ROXICODONE) 5 MG immediate release tablet Take 1 tablet (5 mg total) by mouth every 6 (six) hours as needed for moderate pain.   pantoprazole (PROTONIX) 40 MG tablet Take 1 tablet (40 mg total) by mouth 2 (two) times daily before a meal.   polyethylene glycol (MIRALAX / GLYCOLAX) 17 g packet Take 17 g by mouth daily.   predniSONE (DELTASONE) 10 MG tablet Take 1 tablet (10 mg total) by mouth daily. Label  & dispense according to the schedule below.  6 tablets day one, then 5 table day 2, then 4 tablets day 3, then 3 tablets day 4, 2 tablets day 5, then 1 tablet day 6, then stop   torsemide (DEMADEX) 20 MG tablet Take 3 tablets (60 mg total) by mouth daily.   traZODone (DESYREL) 150 MG tablet Take 150 mg by mouth at bedtime.    Allergies:   Patient has no known allergies.   Social History   Socioeconomic History   Marital status: Divorced    Spouse name:  Not on file   Number of children: Not on file   Years of education: Not on file   Highest education level: Not on file  Occupational History   Not on file  Social Needs   Financial resource strain: Not on file   Food insecurity    Worry: Not on file    Inability: Not on file   Transportation needs    Medical: Not on file    Non-medical: Not on file  Tobacco Use   Smoking status: Passive Smoke Exposure - Never Smoker   Smokeless tobacco: Never Used  Substance and Sexual Activity   Alcohol use: No   Drug use: Never   Sexual activity: Not on file  Lifestyle   Physical activity    Days per week: Not on file    Minutes per session: Not on file   Stress: Not on file  Relationships   Social connections    Talks on phone: Not on file    Gets together: Not on file    Attends  religious service: Not on file    Active member of club or organization: Not on file    Attends meetings of clubs or organizations: Not on file    Relationship status: Not on file  Other Topics Concern   Not on file  Social History Narrative   Not on file     Family History:  The patient's family history includes Hypertrophic cardiomyopathy in her sister.  ROS:   Review of Systems  Constitutional: Positive for malaise/fatigue and weight loss. Negative for chills, diaphoresis and fever.       Diminished appetite  HENT: Negative for congestion.   Eyes: Negative for discharge and redness.  Respiratory: Negative for cough, hemoptysis, sputum production, shortness of breath and wheezing.   Cardiovascular: Negative for chest pain, palpitations, orthopnea, claudication, leg swelling and PND.  Gastrointestinal: Negative for abdominal pain, blood in stool, heartburn, melena, nausea and vomiting.  Genitourinary: Negative for hematuria.  Musculoskeletal: Negative for falls and myalgias.  Skin: Negative for rash.  Neurological: Positive for weakness. Negative for dizziness, tingling, tremors, sensory change, speech change, focal weakness and loss of consciousness.  Endo/Heme/Allergies: Does not bruise/bleed easily.  Psychiatric/Behavioral: Negative for substance abuse. The patient is not nervous/anxious.      EKGs/Labs/Other Studies Reviewed:    Studies reviewed were summarized above. The additional studies were reviewed today:  2D echo 05/21/2019: 1. Left ventricular ejection fraction, by visual estimation, is >75%. The left ventricle has hyperdynamic function. There is severely increased left ventricular hypertrophy.  2. Definity contrast agent was given IV to delineate the left ventricular endocardial borders.  3. Elevated mean left atrial pressure.  4. Hypertrophic cardiomyopathy.  5. Left ventricular diastolic Doppler parameters are consistent with pseudonormalization pattern of LV  diastolic filling.  6. Global right ventricle was not well visualized.The right ventricular size is not well visualized. No increase in right ventricular wall thickness.  7. Left atrial size was mildly dilated.  8. Right atrial size was not well visualized.  9. The pericardium was not well visualized. 10. Mild aortic valve annular calcification. 11. The mitral valve is degenerative. Mild mitral valve regurgitation. 12. The tricuspid valve is not well visualized. Tricuspid valve regurgitation was not visualized by color flow Doppler. 13. The aortic valve was not well visualized Aortic valve regurgitation is trivial by color flow Doppler. Mild aortic valve sclerosis without stenosis. 14. The pulmonic valve was not well visualized. Pulmonic valve regurgitation is not  visualized by color flow Doppler. 15. The inferior vena cava is normal in size with greater than 50% respiratory variability, suggesting right atrial pressure of 3 mmHg. 16. The interatrial septum was not well visualized.   EKG:  EKG is ordered today.  The EKG ordered today demonstrates NSR, 78 bpm, left axis deviation, LVH with early repolarization, poor R wave progression along the precordial leads, nonspecific lateral ST-T changes, grossly unchanged from prior  Recent Labs: 05/21/2019: TSH 0.654 06/01/2019: ALT 11; B Natriuretic Peptide 358.0 06/02/2019: Hemoglobin 10.2; Platelets 301 06/06/2019: BUN 34; Magnesium 2.3; Potassium 4.5; Sodium 137 06/08/2019: Creatinine, Ser 0.97  Recent Lipid Panel    Component Value Date/Time   CHOL 90 09/05/2016 0314   TRIG 60 09/05/2016 0314   HDL 30 (L) 09/05/2016 0314   CHOLHDL 3.0 09/05/2016 0314   VLDL 12 09/05/2016 0314   LDLCALC 48 09/05/2016 0314    PHYSICAL EXAM:    VS:  BP 108/60 (BP Location: Left Arm, Patient Position: Sitting, Cuff Size: Large)    Pulse 78    Ht 5\' 4"  (1.626 m)    Wt 210 lb 4 oz (95.4 kg)    BMI 36.09 kg/m   BMI: Body mass index is 36.09 kg/m.  Physical  Exam  Constitutional: She is oriented to person, place, and time. She appears well-developed and well-nourished.  HENT:  Head: Normocephalic and atraumatic.  Eyes: Right eye exhibits no discharge. Left eye exhibits no discharge.  Neck: Normal range of motion. No JVD present.  Cardiovascular: Normal rate, regular rhythm, S1 normal and S2 normal. Exam reveals no distant heart sounds, no friction rub, no midsystolic click and no opening snap.  Murmur heard. Pulses:      Posterior tibial pulses are 2+ on the right side and 2+ on the left side.  2/6 systolic murmur heard throughout  Pulmonary/Chest: Effort normal and breath sounds normal. No respiratory distress. She has no decreased breath sounds. She has no wheezes. She has no rales. She exhibits no tenderness.  Abdominal: Soft. She exhibits no distension. There is no abdominal tenderness.  Musculoskeletal:        General: No edema.  Neurological: She is alert and oriented to person, place, and time.  Skin: Skin is warm and dry. No cyanosis. Nails show no clubbing.  Psychiatric: She has a normal mood and affect. Her speech is normal and behavior is normal. Judgment and thought content normal.    Wt Readings from Last 3 Encounters:  06/14/19 210 lb 4 oz (95.4 kg)  06/08/19 219 lb 9.3 oz (99.6 kg)  05/26/19 213 lb 11.2 oz (96.9 kg)     ASSESSMENT & PLAN:   1. Hypertrophic obstructive cardiomyopathy/HFpEF/pulmonary hypertension: She appears euvolemic and well compensated.  Cannot exclude some of the patient's overall presenting symptoms being in the setting of restrictive cardiomyopathy with low output.  In this setting, we will plan for right and left cardiac cath now that she has improved from her recent rhinovirus infection.  On day of discharge, it appears patient was transitioned from Lasix 40 mg daily which she has been on for several days prior to discharge to torsemide 60 mg daily.  That said, she has been taking torsemide 80 mg daily  for unclear reasons.  Given this, we will check a BMP today to ensure stable renal function and potassium.  Risks and benefits of cardiac catheterization have been discussed with the patient including risks of bleeding, bruising, infection, kidney damage, stroke, heart attack, urgent  need for bypass, injury to a limb, and death. The patient understands these risks and is willing to proceed with the procedure. All questions have been answered and concerns listened to.   2. Nonobstructive CAD/elevated troponin: Prior cath at [facility] in 03/2016 demonstrated nonobstructive disease.  Recent elevated troponin likely demand ischemia in the setting of underlying hypertrophic cardiomyopathy, volume overload, and mild anemia.  Plan for cardiac cath as outlined above.  Continue aspirin, Lipitor, diltiazem, and metoprolol.  3. Normocytic anemia: Stable hemoglobin at 10.2 most recently from 06/02/2019.  Maintaining stable hemoglobin will be important moving forward given her history of HOCM.  Check CBC.  4. Mitral regurgitation: Mild to moderate by most recent echo.  Continue to monitor clinically.  5. HTN: Blood pressure well controlled.  Continue current regimen including diltiazem, metoprolol, and torsemide.  6. Morbid obesity/OSA: Weight loss advised.  Compliance with CPAP recommended.  Disposition: F/u with Dr. Rockey Situ or an APP in 1 week after right and left cardiac cath.   Medication Adjustments/Labs and Tests Ordered: Current medicines are reviewed at length with the patient today.  Concerns regarding medicines are outlined above. Medication changes, Labs and Tests ordered today are summarized above and listed in the Patient Instructions accessible in Encounters.   Signed, Christell Faith, PA-C 06/14/2019 11:31 AM     [place] [address] [place], [place] [address] [phone]

## 2019-06-12 NOTE — Progress Notes (Signed)
Cardiology Office Note    Date:  06/14/2019   ID:  Julie Bender, DOB 1960/08/21, MRN [id number]  PCP:  [facility]  Cardiologist:  Ida Rogue, MD  Electrophysiologist:  None   Chief Complaint: Hospital follow-up  History of Present Illness:   Julie Bender is a 58 y.o. female with history of hypertropic obstructive cardiomyopathy s/p ICD, nonobstructive CAD by [facility] in 03/2016, pulmonary hypertension, mitral valve regurgitation, IDDM, HTN, OSA not treated with CPAP secondary to financial issues, and obesity who presents for hospital follow up as detailed below.   Patient was previously followed by [facility].  She underwent diagnostic cath in 03/2016 that did not reveal any significant CAD with markedly elevated filling pressures and then end-diastolic pressure around 40 mmHg with a wedge pressure of 25 mmHg, mean pulmonary pressure of 41 mmHg, mid cavitary gradient 40 to 60 mmHg, no SAM.  Echo in 04/2017 while admitted to [facility] for acute on chronic diastolic CHF showed an EF greater than 70%, severe LV wall thickness, hyperdynamic LV with mid cavity obliteration, mid ventricular gradient 41 mm rate increase to 62 mmHg with Valsalva, mild RV dilatation with normal function, moderate biatrial enlargement, moderate to severe mitral regurgitation, sclerotic aortic valve, moderate tricuspid regurgitation, PASP 42 to 47 mmHg.  She has subsequently transitioned care to [facility] after admission in 04/2018 for acute on chronic diastolic CHF.  Echo during that admission showed an EF of 55 to 60%, moderate concentric LVH with the septal wall appearing to be moderate to severe with mild LVOT obstruction, normal wall motion, grade 2 diastolic dysfunction, trivial aortic regurgitation, mild to moderate mitral regurgitation, mildly dilated left atrium, normal RV systolic function.  She was readmitted to the hospital in 02/2019 with acute respiratory distress with hypoxia in the setting of COPD  exacerbation and acute on chronic diastolic CHF treated with IV steroids and IV diuresis.  She was seen by her primary cardiologist in follow-up in 02/2019 and reported chronic shortness of breath with a reported dry weight approximately 215 pounds.  ReDs vest score that day was 31%.  She reported longstanding chronic chest pain.  BP was soft in the 0000000 systolic leading her verapamil to be decreased to 240 mg daily.  She was continued on torsemide 60 mg daily.  More recently, in early 05/2019 with shortness of breath and cough consistent with COPD exacerbation with recommendation to proceed with diagnostic right and left heart cath down the road.  She was readmitted shortly after discharge in mid 05/2019 with continued shortness of breath and cough that had persisted for several weeks with concern for possible COVID-19 in spite of testing negative.  She appeared euvolemic and symptoms were not felt to be primarily cardiac in etiology.  High-sensitivity troponin minimally elevated with a peak of 90 and downtrending.  She subsequently tested positive for rhinovirus.  Echo showed a hyperdynamic LV systolic function with an EF greater than 75%, severely increased LVH, elevated mean left atrial pressure, diastolic dysfunction,, mildly dilated left atrium, degenerative mitral valve with mild mitral regurgitation, overall technically difficult study.  Plan was for right and left cardiac cath once she recovered from rhinovirus infection.  Documented discharge weight of [id number] kg with uncertain validity.  On day of discharge, it appears the patient was transitioned from Lasix 40 mg daily to torsemide 60 mg daily.  She comes in today noting improvement in her shortness of breath.  She denies any chest pain, palpitations, dizziness, presyncope, syncope.  She does note ongoing fatigue and generalized malaise.  No lower extremity swelling, abdominal distention, orthopnea, PND, or early satiety.  No device shocks.  She indicates  she has been taking 80 mg of torsemide rather than the previously prescribed 60 mg of torsemide.  No falls, BRBPR, or melena.  Appetite has been slow to return following her hospital discharge.   Labs: 05/2019 - SCr 0.97, magnesium 2.3, potassium 4.5, HGB 10.2, PLT 301, BNP 358, albumin 3.5, AST/ALT normal, TSH normal, A1c 7.2  Past Medical History:  Diagnosis Date   CHF (congestive heart failure) (HCC)    COPD (chronic obstructive pulmonary disease) (HCC)    Diabetes mellitus without complication ([place])    Diabetic retinopathy ([place])    History of placement of internal cardiac defibrillator    Hypertension    Hypertrophic cardiomyopathy ([place])     Past Surgical History:  Procedure Laterality Date   CARDIAC DEFIBRILLATOR PLACEMENT     CHOLECYSTECTOMY      Current Medications: Current Meds  Medication Sig   albuterol (PROVENTIL HFA;VENTOLIN HFA) 108 (90 Base) MCG/ACT inhaler Inhale 2 puffs into the lungs every 6 (six) hours as needed for wheezing or shortness of breath.   aspirin EC 81 MG tablet Take 81 mg by mouth daily.   atorvastatin (LIPITOR) 40 MG tablet Take 1 tablet (40 mg total) by mouth daily at 6 PM. (Patient taking differently: Take 40 mg by mouth daily. )   chlorpheniramine-HYDROcodone (TUSSIONEX) 10-8 MG/5ML SUER Take 5 mLs by mouth every 12 (twelve) hours as needed for cough.   citalopram (CELEXA) 20 MG tablet Take 20 mg by mouth daily.   diltiazem (CARDIZEM) 60 MG tablet Take 1 tablet (60 mg total) by mouth every 8 (eight) hours.   gabapentin (NEURONTIN) 300 MG capsule Take 1 capsule (300 mg total) by mouth 2 (two) times daily.   insulin aspart (NOVOLOG FLEXPEN) 100 UNIT/ML FlexPen Inject 13 Units into the skin 3 (three) times daily with meals.    insulin detemir (LEVEMIR) 100 UNIT/ML injection Inject 42 Units into the skin at bedtime.   metFORMIN (GLUCOPHAGE) 850 MG tablet Take 850 mg by mouth 3 (three) times daily.   metoprolol tartrate (LOPRESSOR)  25 MG tablet Take 1 tablet (25 mg total) by mouth 2 (two) times daily.   nitroGLYCERIN (NITROSTAT) 0.4 MG SL tablet Place 1 tablet (0.4 mg total) under the tongue every 5 (five) minutes as needed for chest pain.   oxyCODONE (OXY IR/ROXICODONE) 5 MG immediate release tablet Take 1 tablet (5 mg total) by mouth every 6 (six) hours as needed for moderate pain.   pantoprazole (PROTONIX) 40 MG tablet Take 1 tablet (40 mg total) by mouth 2 (two) times daily before a meal.   polyethylene glycol (MIRALAX / GLYCOLAX) 17 g packet Take 17 g by mouth daily.   predniSONE (DELTASONE) 10 MG tablet Take 1 tablet (10 mg total) by mouth daily. Label  & dispense according to the schedule below.  6 tablets day one, then 5 table day 2, then 4 tablets day 3, then 3 tablets day 4, 2 tablets day 5, then 1 tablet day 6, then stop   torsemide (DEMADEX) 20 MG tablet Take 3 tablets (60 mg total) by mouth daily.   traZODone (DESYREL) 150 MG tablet Take 150 mg by mouth at bedtime.    Allergies:   Patient has no known allergies.   Social History   Socioeconomic History   Marital status: Divorced    Spouse name:  Not on file   Number of children: Not on file   Years of education: Not on file   Highest education level: Not on file  Occupational History   Not on file  Social Needs   Financial resource strain: Not on file   Food insecurity    Worry: Not on file    Inability: Not on file   Transportation needs    Medical: Not on file    Non-medical: Not on file  Tobacco Use   Smoking status: Passive Smoke Exposure - Never Smoker   Smokeless tobacco: Never Used  Substance and Sexual Activity   Alcohol use: No   Drug use: Never   Sexual activity: Not on file  Lifestyle   Physical activity    Days per week: Not on file    Minutes per session: Not on file   Stress: Not on file  Relationships   Social connections    Talks on phone: Not on file    Gets together: Not on file    Attends  religious service: Not on file    Active member of club or organization: Not on file    Attends meetings of clubs or organizations: Not on file    Relationship status: Not on file  Other Topics Concern   Not on file  Social History Narrative   Not on file     Family History:  The patient's family history includes Hypertrophic cardiomyopathy in her sister.  ROS:   Review of Systems  Constitutional: Positive for malaise/fatigue and weight loss. Negative for chills, diaphoresis and fever.       Diminished appetite  HENT: Negative for congestion.   Eyes: Negative for discharge and redness.  Respiratory: Negative for cough, hemoptysis, sputum production, shortness of breath and wheezing.   Cardiovascular: Negative for chest pain, palpitations, orthopnea, claudication, leg swelling and PND.  Gastrointestinal: Negative for abdominal pain, blood in stool, heartburn, melena, nausea and vomiting.  Genitourinary: Negative for hematuria.  Musculoskeletal: Negative for falls and myalgias.  Skin: Negative for rash.  Neurological: Positive for weakness. Negative for dizziness, tingling, tremors, sensory change, speech change, focal weakness and loss of consciousness.  Endo/Heme/Allergies: Does not bruise/bleed easily.  Psychiatric/Behavioral: Negative for substance abuse. The patient is not nervous/anxious.      EKGs/Labs/Other Studies Reviewed:    Studies reviewed were summarized above. The additional studies were reviewed today:  2D echo 05/21/2019: 1. Left ventricular ejection fraction, by visual estimation, is >75%. The left ventricle has hyperdynamic function. There is severely increased left ventricular hypertrophy.  2. Definity contrast agent was given IV to delineate the left ventricular endocardial borders.  3. Elevated mean left atrial pressure.  4. Hypertrophic cardiomyopathy.  5. Left ventricular diastolic Doppler parameters are consistent with pseudonormalization pattern of LV  diastolic filling.  6. Global right ventricle was not well visualized.The right ventricular size is not well visualized. No increase in right ventricular wall thickness.  7. Left atrial size was mildly dilated.  8. Right atrial size was not well visualized.  9. The pericardium was not well visualized. 10. Mild aortic valve annular calcification. 11. The mitral valve is degenerative. Mild mitral valve regurgitation. 12. The tricuspid valve is not well visualized. Tricuspid valve regurgitation was not visualized by color flow Doppler. 13. The aortic valve was not well visualized Aortic valve regurgitation is trivial by color flow Doppler. Mild aortic valve sclerosis without stenosis. 14. The pulmonic valve was not well visualized. Pulmonic valve regurgitation is not  visualized by color flow Doppler. 15. The inferior vena cava is normal in size with greater than 50% respiratory variability, suggesting right atrial pressure of 3 mmHg. 16. The interatrial septum was not well visualized.   EKG:  EKG is ordered today.  The EKG ordered today demonstrates NSR, 78 bpm, left axis deviation, LVH with early repolarization, poor R wave progression along the precordial leads, nonspecific lateral ST-T changes, grossly unchanged from prior  Recent Labs: 05/21/2019: TSH 0.654 06/01/2019: ALT 11; B Natriuretic Peptide 358.0 06/02/2019: Hemoglobin 10.2; Platelets 301 06/06/2019: BUN 34; Magnesium 2.3; Potassium 4.5; Sodium 137 06/08/2019: Creatinine, Ser 0.97  Recent Lipid Panel    Component Value Date/Time   CHOL 90 09/05/2016 0314   TRIG 60 09/05/2016 0314   HDL 30 (L) 09/05/2016 0314   CHOLHDL 3.0 09/05/2016 0314   VLDL 12 09/05/2016 0314   LDLCALC 48 09/05/2016 0314    PHYSICAL EXAM:    VS:  BP 108/60 (BP Location: Left Arm, Patient Position: Sitting, Cuff Size: Large)    Pulse 78    Ht 5\' 4"  (1.626 m)    Wt 210 lb 4 oz (95.4 kg)    BMI 36.09 kg/m   BMI: Body mass index is 36.09 kg/m.  Physical  Exam  Constitutional: She is oriented to person, place, and time. She appears well-developed and well-nourished.  HENT:  Head: Normocephalic and atraumatic.  Eyes: Right eye exhibits no discharge. Left eye exhibits no discharge.  Neck: Normal range of motion. No JVD present.  Cardiovascular: Normal rate, regular rhythm, S1 normal and S2 normal. Exam reveals no distant heart sounds, no friction rub, no midsystolic click and no opening snap.  Murmur heard. Pulses:      Posterior tibial pulses are 2+ on the right side and 2+ on the left side.  2/6 systolic murmur heard throughout  Pulmonary/Chest: Effort normal and breath sounds normal. No respiratory distress. She has no decreased breath sounds. She has no wheezes. She has no rales. She exhibits no tenderness.  Abdominal: Soft. She exhibits no distension. There is no abdominal tenderness.  Musculoskeletal:        General: No edema.  Neurological: She is alert and oriented to person, place, and time.  Skin: Skin is warm and dry. No cyanosis. Nails show no clubbing.  Psychiatric: She has a normal mood and affect. Her speech is normal and behavior is normal. Judgment and thought content normal.    Wt Readings from Last 3 Encounters:  06/14/19 210 lb 4 oz (95.4 kg)  06/08/19 219 lb 9.3 oz (99.6 kg)  05/26/19 213 lb 11.2 oz (96.9 kg)     ASSESSMENT & PLAN:   1. Hypertrophic obstructive cardiomyopathy/HFpEF/pulmonary hypertension: She appears euvolemic and well compensated.  Cannot exclude some of the patient's overall presenting symptoms being in the setting of restrictive cardiomyopathy with low output.  In this setting, we will plan for right and left cardiac cath now that she has improved from her recent rhinovirus infection.  On day of discharge, it appears patient was transitioned from Lasix 40 mg daily which she has been on for several days prior to discharge to torsemide 60 mg daily.  That said, she has been taking torsemide 80 mg daily  for unclear reasons.  Given this, we will check a BMP today to ensure stable renal function and potassium.  Risks and benefits of cardiac catheterization have been discussed with the patient including risks of bleeding, bruising, infection, kidney damage, stroke, heart attack, urgent  need for bypass, injury to a limb, and death. The patient understands these risks and is willing to proceed with the procedure. All questions have been answered and concerns listened to.   2. Nonobstructive CAD/elevated troponin: Prior cath at [facility] in 03/2016 demonstrated nonobstructive disease.  Recent elevated troponin likely demand ischemia in the setting of underlying hypertrophic cardiomyopathy, volume overload, and mild anemia.  Plan for cardiac cath as outlined above.  Continue aspirin, Lipitor, diltiazem, and metoprolol.  3. Normocytic anemia: Stable hemoglobin at 10.2 most recently from 06/02/2019.  Maintaining stable hemoglobin will be important moving forward given her history of HOCM.  Check CBC.  4. Mitral regurgitation: Mild to moderate by most recent echo.  Continue to monitor clinically.  5. HTN: Blood pressure well controlled.  Continue current regimen including diltiazem, metoprolol, and torsemide.  6. Morbid obesity/OSA: Weight loss advised.  Compliance with CPAP recommended.  Disposition: F/u with Dr. Rockey Situ or an APP in 1 week after right and left cardiac cath.   Medication Adjustments/Labs and Tests Ordered: Current medicines are reviewed at length with the patient today.  Concerns regarding medicines are outlined above. Medication changes, Labs and Tests ordered today are summarized above and listed in the Patient Instructions accessible in Encounters.   Signed, Christell Faith, PA-C 06/14/2019 11:31 AM     [facility] [address] [place] [address] [place], [place] [address] [phone]

## 2019-06-14 ENCOUNTER — Other Ambulatory Visit: Payer: Self-pay

## 2019-06-14 ENCOUNTER — Other Ambulatory Visit: Payer: Self-pay | Admitting: Physician Assistant

## 2019-06-14 ENCOUNTER — Ambulatory Visit (INDEPENDENT_AMBULATORY_CARE_PROVIDER_SITE_OTHER): Payer: Medicare Other | Admitting: Physician Assistant

## 2019-06-14 ENCOUNTER — Encounter: Payer: Self-pay | Admitting: Physician Assistant

## 2019-06-14 ENCOUNTER — Telehealth: Payer: Self-pay

## 2019-06-14 VITALS — BP 108/60 | HR 78 | Ht 64.0 in | Wt 210.2 lb

## 2019-06-14 DIAGNOSIS — I340 Nonrheumatic mitral (valve) insufficiency: Secondary | ICD-10-CM

## 2019-06-14 DIAGNOSIS — G4733 Obstructive sleep apnea (adult) (pediatric): Secondary | ICD-10-CM

## 2019-06-14 DIAGNOSIS — I421 Obstructive hypertrophic cardiomyopathy: Secondary | ICD-10-CM

## 2019-06-14 DIAGNOSIS — I2510 Atherosclerotic heart disease of native coronary artery without angina pectoris: Secondary | ICD-10-CM

## 2019-06-14 DIAGNOSIS — I272 Pulmonary hypertension, unspecified: Secondary | ICD-10-CM | POA: Diagnosis not present

## 2019-06-14 DIAGNOSIS — I10 Essential (primary) hypertension: Secondary | ICD-10-CM

## 2019-06-14 DIAGNOSIS — I5032 Chronic diastolic (congestive) heart failure: Secondary | ICD-10-CM

## 2019-06-14 DIAGNOSIS — D649 Anemia, unspecified: Secondary | ICD-10-CM

## 2019-06-14 NOTE — Patient Instructions (Addendum)
Medication Instructions:  Your physician recommends that you continue on your current medications as directed. Please refer to the Current Medication list given to you today.  *If you need a refill on your cardiac medications before your next appointment, please call your pharmacy*  Lab Work: Your physician recommends that you have lab work today(CBC, BMET)  CV19 Pre admit testing DRIVE THRU  Please report to the PAT testing site ([facility]) on ____10/30_____ date ______12:30-2:30PM_____ time for your DRIVE THRU covid testing that is required prior to your procedure.  Following covid testing, please remain in quarantine. If you must be around others, please wash hands, avoid touching face and wear your mask.   If you have labs (blood work) drawn today and your tests are completely normal, you will receive your results only by: Marland Kitchen MyChart Message (if you have MyChart) OR . A paper copy in the mail If you have any lab test that is abnormal or we need to change your treatment, we will call you to review the results.  Testing/Procedures:    [facility] [place], [place] [address] Dept: [phone] Loc: [place]  06/14/2019  You are scheduled for a right/left heart cath. Arrive ___11/3/20__Date___09:30_am_Arrival Time at the medical mall. (1 hours before your procedure to ensure your preparation). Free valet parking service is available.   Special note: Every effort is made to have your procedure done on time. Please understand that emergencies sometimes delay scheduled procedures.  2. Diet: Do not eat solid foods after midnight.  The patient may have clear liquids until 5am upon the day of the procedure.  3. Labs: You will need to have blood drawn today 4. Medication instructions in preparation for your procedure:   Do not take Diabetes Med  Glucophage (Metformin) on the day of the procedure and HOLD 48 HOURS AFTER THE PROCEDURE.  Night before: Take half dose long acting insulin. Morning of hold short acting insulin.  Hold Torsemide the morning of.  On the morning of your procedure, take your Aspirin and any morning medicines NOT listed above.  You may use sips of water.  5. Plan for one night stay--bring personal belongings. 6. Bring a current list of your medications and current insurance cards. 7. You MUST have a responsible person to drive you home. 8. Someone MUST be with you the first 24 hours after you arrive home or your discharge will be delayed. 9. Please wear clothes that are easy to get on and off and wear slip-on shoes.  Thank you for allowing [place] to care for you!   -- [facility]   Follow-Up: At [facility], you and your health needs are our priority.  As part of our continuing mission to provide you with exceptional heart care, we have created designated Provider Care Teams.  These Care Teams include your primary Cardiologist (physician) and Advanced Practice Providers (APPs -  Physician Assistants and Nurse Practitioners) who all work together to provide you with the care you need, when you need it.  Your next appointment:   2 weeks  The format for your next appointment:   In Person  Provider:    You may see Ida Rogue, MD or Christell Faith, PA-C.

## 2019-06-14 NOTE — Telephone Encounter (Signed)
Incoming call from patient.   She confirmed that she will be able to have cath on 11/3 and Covid testing tomorrow.   All questions answered.   Forwarding information to precert and procedure MD.

## 2019-06-14 NOTE — Progress Notes (Signed)
Orders for cath have been placed.  

## 2019-06-14 NOTE — Telephone Encounter (Signed)
Made call to patient to discuss cath scheduling.   Cath 11/3, arrival time of 9:30 AM Covid test 10/30.  She reported that she would need to check and make sure that she could get a ride and would call back to verify.   Cath will need to be with Dr. Saunders Revel or Dr. Fletcher Anon, so next available alternative date would be 11/9 or 10.  Will confirm and make provider aware after patient confirmation.

## 2019-06-15 ENCOUNTER — Other Ambulatory Visit
Admission: RE | Admit: 2019-06-15 | Discharge: 2019-06-15 | Disposition: A | Discharge status: Home / Self Care | Payer: Medicare Other | Source: Ambulatory Visit | Attending: Internal Medicine | Admitting: Internal Medicine

## 2019-06-15 ENCOUNTER — Telehealth: Payer: Self-pay | Admitting: Physician Assistant

## 2019-06-15 DIAGNOSIS — Z20828 Contact with and (suspected) exposure to other viral communicable diseases: Secondary | ICD-10-CM | POA: Insufficient documentation

## 2019-06-15 DIAGNOSIS — Z01812 Encounter for preprocedural laboratory examination: Secondary | ICD-10-CM | POA: Insufficient documentation

## 2019-06-15 LAB — BASIC METABOLIC PANEL
BUN/Creatinine Ratio: 18 (ref 12–28)
BUN: 20 mg/dL (ref 8–27)
CO2: 23 mmol/L (ref 20–29)
Calcium: 9 mg/dL (ref 8.7–10.3)
Chloride: 96 mmol/L (ref 96–106)
Creatinine, Ser: 1.13 mg/dL — ABNORMAL HIGH (ref 0.57–1.00)
GFR calc Af Amer: 61 mL/min/{1.73_m2} (ref >59)
GFR calc non Af Amer: 53 mL/min/{1.73_m2} — ABNORMAL LOW (ref >59)
Glucose: 93 mg/dL (ref 65–99)
Potassium: 4.1 mmol/L (ref 3.5–5.2)
Sodium: 138 mmol/L (ref 134–144)

## 2019-06-15 LAB — CBC
Hematocrit: 43.1 % (ref 34.0–46.6)
Hemoglobin: 13.9 g/dL (ref 11.1–15.9)
MCH: 26.8 pg (ref 26.6–33.0)
MCHC: 32.3 g/dL (ref 31.5–35.7)
MCV: 83 fL (ref 79–97)
Platelets: 328 10*3/uL (ref 150–450)
RBC: 5.18 x10E6/uL (ref 3.77–5.28)
RDW: 14.8 % (ref 11.7–15.4)
WBC: 16.4 10*3/uL — ABNORMAL HIGH (ref 3.4–10.8)

## 2019-06-15 NOTE — Telephone Encounter (Signed)
I attempted to call the patient. °No answer- I left a message to please call back.  °

## 2019-06-15 NOTE — Telephone Encounter (Signed)
Patient returning call for results 

## 2019-06-15 NOTE — Telephone Encounter (Signed)
Notes recorded by Rise Mu, PA-C on 06/15/2019 at 7:24 AM EDT  Pre cath labs showed a leukocytosis (recently treated with prednisone)  Normal HGB.  Mildly elevated, though relatively stable renal function.  Potassium at goal.   Patient should follow up with her PCP for recheck CBC in about a week to reassess.

## 2019-06-16 LAB — SARS CORONAVIRUS 2 (TAT 6-24 HRS): SARS Coronavirus 2: NEGATIVE

## 2019-06-18 NOTE — Telephone Encounter (Signed)
Called to give the patient lab results and Ryan Dunn, PA recommendation. [email or web address]. 

## 2019-06-18 NOTE — Telephone Encounter (Signed)
Patient returning call.

## 2019-06-18 NOTE — Telephone Encounter (Signed)
Attempted to contact the patient x2. lmom with results. Patient is to call back if any questions. Results also mailed to the patient.

## 2019-06-19 ENCOUNTER — Ambulatory Visit
Admission: RE | Admit: 2019-06-19 | Discharge: 2019-06-19 | Disposition: A | Discharge status: Home / Self Care | Payer: Medicare Other | Attending: Internal Medicine | Admitting: Internal Medicine

## 2019-06-19 ENCOUNTER — Encounter: Payer: Self-pay | Admitting: *Deleted

## 2019-06-19 ENCOUNTER — Other Ambulatory Visit: Payer: Self-pay

## 2019-06-19 ENCOUNTER — Encounter
Admission: RE | Disposition: A | Discharge status: Home / Self Care | Payer: Self-pay | Source: Home / Self Care | Attending: Internal Medicine

## 2019-06-19 DIAGNOSIS — Z7982 Long term (current) use of aspirin: Secondary | ICD-10-CM | POA: Insufficient documentation

## 2019-06-19 DIAGNOSIS — I422 Other hypertrophic cardiomyopathy: Secondary | ICD-10-CM | POA: Diagnosis not present

## 2019-06-19 DIAGNOSIS — D649 Anemia, unspecified: Secondary | ICD-10-CM | POA: Diagnosis not present

## 2019-06-19 DIAGNOSIS — J449 Chronic obstructive pulmonary disease, unspecified: Secondary | ICD-10-CM | POA: Diagnosis not present

## 2019-06-19 DIAGNOSIS — Z95810 Presence of automatic (implantable) cardiac defibrillator: Secondary | ICD-10-CM | POA: Insufficient documentation

## 2019-06-19 DIAGNOSIS — I509 Heart failure, unspecified: Secondary | ICD-10-CM | POA: Insufficient documentation

## 2019-06-19 DIAGNOSIS — Z79899 Other long term (current) drug therapy: Secondary | ICD-10-CM | POA: Insufficient documentation

## 2019-06-19 DIAGNOSIS — I2510 Atherosclerotic heart disease of native coronary artery without angina pectoris: Secondary | ICD-10-CM | POA: Insufficient documentation

## 2019-06-19 DIAGNOSIS — Z6836 Body mass index (BMI) 36.0-36.9, adult: Secondary | ICD-10-CM | POA: Insufficient documentation

## 2019-06-19 DIAGNOSIS — I340 Nonrheumatic mitral (valve) insufficiency: Secondary | ICD-10-CM | POA: Insufficient documentation

## 2019-06-19 DIAGNOSIS — I272 Pulmonary hypertension, unspecified: Secondary | ICD-10-CM | POA: Diagnosis not present

## 2019-06-19 DIAGNOSIS — I421 Obstructive hypertrophic cardiomyopathy: Secondary | ICD-10-CM | POA: Diagnosis present

## 2019-06-19 DIAGNOSIS — E11319 Type 2 diabetes mellitus with unspecified diabetic retinopathy without macular edema: Secondary | ICD-10-CM | POA: Insufficient documentation

## 2019-06-19 DIAGNOSIS — G4733 Obstructive sleep apnea (adult) (pediatric): Secondary | ICD-10-CM | POA: Insufficient documentation

## 2019-06-19 DIAGNOSIS — Z794 Long term (current) use of insulin: Secondary | ICD-10-CM | POA: Insufficient documentation

## 2019-06-19 DIAGNOSIS — I110 Hypertensive heart disease with heart failure: Secondary | ICD-10-CM | POA: Insufficient documentation

## 2019-06-19 HISTORY — DX: Unspecified asthma, uncomplicated: J45.909

## 2019-06-19 HISTORY — PX: RIGHT/LEFT HEART CATH AND CORONARY ANGIOGRAPHY: CATH118266

## 2019-06-19 LAB — GLUCOSE, CAPILLARY
Glucose-Capillary: 224 mg/dL — ABNORMAL HIGH (ref 70–99)
Glucose-Capillary: 193 mg/dL — ABNORMAL HIGH (ref 70–99)

## 2019-06-19 SURGERY — RIGHT/LEFT HEART CATH AND CORONARY ANGIOGRAPHY
Anesthesia: Moderate Sedation

## 2019-06-19 MED ORDER — ACETAMINOPHEN 325 MG PO TABS
ORAL_TABLET | ORAL | Status: AC
  Filled 2019-06-19: qty 2

## 2019-06-19 MED ORDER — VERAPAMIL HCL 2.5 MG/ML IV SOLN
INTRAVENOUS | Status: AC
  Filled 2019-06-19: qty 2

## 2019-06-19 MED ORDER — TORSEMIDE 20 MG PO TABS: 80.0000 mg | ORAL_TABLET | Freq: Every day | ORAL | 5 refills | Status: DC

## 2019-06-19 MED ORDER — MIDAZOLAM HCL 2 MG/2ML IJ SOLN
INTRAMUSCULAR | Status: DC | PRN
  Administered 2019-06-19 (×2): 1 mg via INTRAVENOUS

## 2019-06-19 MED ORDER — SODIUM CHLORIDE 0.9 % IV SOLN: 250.0000 mL | INTRAVENOUS | Status: DC | PRN

## 2019-06-19 MED ORDER — SODIUM CHLORIDE 0.9% FLUSH: 3.0000 mL | Freq: Two times a day (BID) | INTRAVENOUS | Status: DC

## 2019-06-19 MED ORDER — ACETAMINOPHEN 500 MG PO TABS
ORAL_TABLET | ORAL | Status: AC
  Filled 2019-06-19: qty 2

## 2019-06-19 MED ORDER — ACETAMINOPHEN 325 MG PO TABS
650.0000 mg | ORAL_TABLET | ORAL | Status: DC | PRN
  Administered 2019-06-19: 650 mg via ORAL

## 2019-06-19 MED ORDER — VERAPAMIL HCL 2.5 MG/ML IV SOLN
INTRAVENOUS | Status: DC | PRN
  Administered 2019-06-19: 2.5 mg via INTRA_ARTERIAL

## 2019-06-19 MED ORDER — HYDRALAZINE HCL 20 MG/ML IJ SOLN: 10.0000 mg | INTRAMUSCULAR | Status: DC | PRN

## 2019-06-19 MED ORDER — SODIUM CHLORIDE 0.9 % IV SOLN
INTRAVENOUS | Status: DC
  Administered 2019-06-19: 10:00:00 via INTRAVENOUS

## 2019-06-19 MED ORDER — SODIUM CHLORIDE 0.9% FLUSH: 3.0000 mL | INTRAVENOUS | Status: DC | PRN

## 2019-06-19 MED ORDER — LABETALOL HCL 5 MG/ML IV SOLN: 10.0000 mg | INTRAVENOUS | Status: DC | PRN

## 2019-06-19 MED ORDER — HEPARIN (PORCINE) IN NACL 1000-0.9 UT/500ML-% IV SOLN
INTRAVENOUS | Status: AC
  Filled 2019-06-19: qty 1000

## 2019-06-19 MED ORDER — HEPARIN (PORCINE) IN NACL 1000-0.9 UT/500ML-% IV SOLN
INTRAVENOUS | Status: DC | PRN
  Administered 2019-06-19: 500 mL

## 2019-06-19 MED ORDER — HEPARIN SODIUM (PORCINE) 1000 UNIT/ML IJ SOLN
INTRAMUSCULAR | Status: DC | PRN
  Administered 2019-06-19: 4500 [IU] via INTRAVENOUS

## 2019-06-19 MED ORDER — FENTANYL CITRATE (PF) 100 MCG/2ML IJ SOLN
INTRAMUSCULAR | Status: DC | PRN
  Administered 2019-06-19 (×2): 25 ug via INTRAVENOUS

## 2019-06-19 MED ORDER — ONDANSETRON HCL 4 MG/2ML IJ SOLN: 4.0000 mg | Freq: Four times a day (QID) | INTRAMUSCULAR | Status: DC | PRN

## 2019-06-19 MED ORDER — MIDAZOLAM HCL 2 MG/2ML IJ SOLN
INTRAMUSCULAR | Status: AC
  Filled 2019-06-19: qty 2

## 2019-06-19 MED ORDER — FENTANYL CITRATE (PF) 100 MCG/2ML IJ SOLN
INTRAMUSCULAR | Status: AC
  Filled 2019-06-19: qty 2

## 2019-06-19 MED ORDER — IOHEXOL 300 MG/ML  SOLN
INTRAMUSCULAR | Status: DC | PRN
  Administered 2019-06-19: 13:00:00 50 mL

## 2019-06-19 MED ORDER — HEPARIN SODIUM (PORCINE) 1000 UNIT/ML IJ SOLN
INTRAMUSCULAR | Status: AC
  Filled 2019-06-19: qty 1

## 2019-06-19 SURGICAL SUPPLY — 12 items
CATH 5F 110X4 TIG (CATHETERS) ×3 IMPLANT
CATH BALLN WEDGE 5F 110CM (CATHETERS) ×3 IMPLANT
CATH INFINITI 5 FR JL3.5 (CATHETERS) ×3 IMPLANT
CATH LANGSTON DUAL 6FR MP1 (CATHETERS) ×3 IMPLANT
DEVICE RAD TR BAND REGULAR (VASCULAR PRODUCTS) ×3 IMPLANT
GLIDESHEATH SLEND SS 6F .021 (SHEATH) ×3 IMPLANT
KIT MANI 3VAL PERCEP (MISCELLANEOUS) ×3 IMPLANT
KIT RIGHT HEART (MISCELLANEOUS) ×3 IMPLANT
PACK CARDIAC CATH (CUSTOM PROCEDURE TRAY) ×3 IMPLANT
SHEATH GLIDE SLENDER 4/5FR (SHEATH) ×3 IMPLANT
WIRE HITORQ VERSACORE ST 145CM (WIRE) ×3 IMPLANT
WIRE ROSEN-J .035X260CM (WIRE) ×3 IMPLANT

## 2019-06-19 NOTE — Discharge Instructions (Signed)
Radial Site Care Refer to this sheet in the next few weeks. These instructions provide you with information about caring for yourself after your procedure. Your health care provider may also give you more specific instructions. Your treatment has been planned according to current medical practices, but problems sometimes occur. Call your health care provider if you have any problems or questions after your procedure. What can I expect after the procedure? After your procedure, it is typical to have the following: Bruising at the radial site that usually fades within 1-2 weeks. Blood collecting in the tissue (hematoma) that may be painful to the touch. It should usually decrease in size and tenderness within 1-2 weeks.  Follow these instructions at home: Take medicines only as directed by your health care provider. If you are on a medication called Metformin please do not take for 48 hours after your procedure. Over the next 48hrs please increase your fluid intake of water and non caffeine beverages to flush the contrast dye out of your system.  You may shower 24 hours after the procedure  Leave your bandage on and gently wash the site with plain soap and water. Pat the area dry with a clean towel. Do not rub the site, because this may cause bleeding.  Remove your dressing 48hrs after your procedure and leave open to air.  Do not submerge your site in water for 7 days. This includes swimming and washing dishes.  Check your insertion site every day for redness, swelling, or drainage. Do not apply powder or lotion to the site. Do not flex or bend the affected arm for 24 hours or as directed by your health care provider. Do not push or pull heavy objects with the affected arm for 24 hours or as directed by your health care provider. Do not lift over 10 lb (4.5 kg) for 5 days after your procedure or as directed by your health care provider. Ask your health care provider when it is okay to: Return to  work or school. Resume usual physical activities or sports. Resume sexual activity. Do not drive home if you are discharged the same day as the procedure. Have someone else drive you. You may drive 48 hours after the procedure Do not operate machinery or power tools for 24 hours after the procedure. If your procedure was done as an outpatient procedure, which means that you went home the same day as your procedure, a responsible adult should be with you for the first 24 hours after you arrive home. Keep all follow-up visits as directed by your health care provider. This is important. Contact a health care provider if: You have a fever. You have chills. You have increased bleeding from the radial site. Hold pressure on the site. Get help right away if: You have unusual pain at the radial site. You have redness, warmth, or swelling at the radial site. You have drainage (other than a small amount of blood on the dressing) from the radial site. The radial site is bleeding, and the bleeding does not stop after 15 minutes of holding steady pressure on the site. Your arm or hand becomes pale, cool, tingly, or numb. This information is not intended to replace advice given to you by your health care provider. Make sure you discuss any questions you have with your health care provider. Document Released: 09/04/2010 Document Revised: 01/08/2016 Document Reviewed: 02/18/2014 Elsevier Interactive Patient Education  2018 [employer]  

## 2019-06-19 NOTE — Telephone Encounter (Signed)
Patient is currently admitted for a cath today.

## 2019-06-19 NOTE — Brief Op Note (Signed)
BRIEF CARDIAC CATHETERIZATION NOTE  DATE: 06/19/2019  TIME: 1:09 PM  PATIENT:  Julie Bender  58 y.o. female  PRE-OPERATIVE DIAGNOSIS:  HOCM  POST-OPERATIVE DIAGNOSIS:  [place]  PROCEDURE:  Procedure(s): RIGHT/LEFT HEART CATH AND CORONARY ANGIOGRAPHY (N/A)  FINDINGS: 1. No angiographically significant CAD. 2. Upper normal to mildly elevated right heart filling pressures. 3. Moderately elevated left heart filling pressures. 4. Moderate to severe pulmonary hypertension. 5. Severely reduced Fick cardiac output/index.  RECOMMENDATIONS: 1. Careful escalation of diuresis. 2. Refer to tertiary care center for further evaluation of HOCM and severe HFpEF. 3. Primary prevention of coronary artery disease.  Nelva Bush, MD [facility] Pager: [phone]

## 2019-06-19 NOTE — Interval H&P Note (Signed)
History and Physical Interval Note:  06/19/2019 12:02 PM  Julie Bender  has presented today for cardiac catheterization, with the diagnosis of hypertrophic obstructive cardiomyopathy.  The various methods of treatment have been discussed with the patient and family. After consideration of risks, benefits and other options for treatment, the patient has consented to  Procedure(s): RIGHT/LEFT HEART CATH AND CORONARY ANGIOGRAPHY (N/A) as a surgical intervention.  The patient's history has been reviewed, patient examined, no change in status, stable for surgery.  I have reviewed the patient's chart and labs.  Questions were answered to the patient's satisfaction.    Cath Lab Visit (complete for each Cath Lab visit)  Clinical Evaluation Leading to the Procedure:   ACS: No.  Non-ACS:    Anginal Classification: CCS IV  Anti-ischemic medical therapy: Maximal Therapy (2 or more classes of medications)  Non-Invasive Test Results: No non-invasive testing performed  Prior CABG: No previous CABG  Julie Bender

## 2019-06-20 ENCOUNTER — Telehealth: Payer: Self-pay | Admitting: *Deleted

## 2019-06-20 ENCOUNTER — Encounter: Payer: Self-pay | Admitting: Internal Medicine

## 2019-06-20 DIAGNOSIS — I421 Obstructive hypertrophic cardiomyopathy: Secondary | ICD-10-CM

## 2019-06-20 DIAGNOSIS — Z79899 Other long term (current) drug therapy: Secondary | ICD-10-CM

## 2019-06-20 NOTE — Telephone Encounter (Signed)
-----   Message from Nelva Bush, MD sent at 06/19/2019  4:46 PM EST ----- Regarding: HOCM Referral Hi Jennifer,Could you help arrange a couple of things for Ms. Armendariz?.  I would like for her to have a BMP in 1 week and to follow-up with Dr. Rockey Situ or an APP in ~2 weeks.  Can you also help refer her to the hypertrophic obstructive cardiomyopathy clinic at [facility], to be seen as soon as possible?Marland Kitchen  Thanks.Gerald Stabs

## 2019-06-20 NOTE — Telephone Encounter (Signed)
Called [facility] Referral line. Was given phone number [phone] and fax number [phone]. I am to fax demographics, notes, insurance card to number provided and they will call patient to arrange the appointment.   Documents faxed to [phone] successfully.

## 2019-06-20 NOTE — Telephone Encounter (Signed)
Patient aware of her upcoming appointment with Ryan on 07/02/19. She verbalized understanding to go to the [facility] on 06/26/19 for lab work. Order entered.  She is aware that I will be making referral to [facility] for HOCM.

## 2019-06-26 ENCOUNTER — Other Ambulatory Visit: Payer: Self-pay

## 2019-06-26 ENCOUNTER — Other Ambulatory Visit
Admission: RE | Admit: 2019-06-26 | Discharge: 2019-06-26 | Disposition: A | Discharge status: Home / Self Care | Payer: Medicare Other | Attending: Internal Medicine | Admitting: Internal Medicine

## 2019-06-26 DIAGNOSIS — Z79899 Other long term (current) drug therapy: Secondary | ICD-10-CM | POA: Diagnosis present

## 2019-06-26 DIAGNOSIS — I421 Obstructive hypertrophic cardiomyopathy: Secondary | ICD-10-CM | POA: Diagnosis present

## 2019-06-26 LAB — BASIC METABOLIC PANEL
Anion gap: 13 (ref 5–15)
BUN: 18 mg/dL (ref 6–20)
CO2: 25 mmol/L (ref 22–32)
Calcium: 8.6 mg/dL — ABNORMAL LOW (ref 8.9–10.3)
Chloride: 101 mmol/L (ref 98–111)
Creatinine, Ser: 1.13 mg/dL — ABNORMAL HIGH (ref 0.44–1.00)
GFR calc Af Amer: >60 mL/min (ref >60)
GFR calc non Af Amer: 53 mL/min — ABNORMAL LOW (ref >60)
Glucose, Bld: 184 mg/dL — ABNORMAL HIGH (ref 70–99)
Potassium: 3.3 mmol/L — ABNORMAL LOW (ref 3.5–5.1)
Sodium: 139 mmol/L (ref 135–145)

## 2019-06-27 ENCOUNTER — Telehealth: Payer: Self-pay | Admitting: Internal Medicine

## 2019-06-27 MED ORDER — POTASSIUM CHLORIDE CRYS ER 20 MEQ PO TBCR: EXTENDED_RELEASE_TABLET | ORAL | 0 refills | Status: DC

## 2019-06-27 NOTE — Telephone Encounter (Signed)
I spoke with the patient regarding her lab results and Dr. Darnelle Bos recommendations to: - start potassium 20 meq- take 2 tablets (40 meq) x 1 dose today, then take 1 tablet (20 meq) by mouth once daily  The patient voices understanding of the above recommendations and is agreeable.   She was also asking about her [facility] referral. I advised her that Dr. Darnelle Bos nurse Anderson Malta sent all of her information to [place] on 06/20/19. I will follow up with Anderson Malta to see if she has heard anything from [facility]. The patient would like a call back to follow up on this.

## 2019-06-27 NOTE — Telephone Encounter (Signed)
Notes recorded by Nelva Bush, MD on 06/27/2019 at 1:05 PM EST  Please let Ms. Hathaway know that her kidney function is stable. Her potassium is a little low at 3.3. I recommend adding potassium chloride 40 mEq x 1 followed by 20 mEq daily. Otherwise, she should continue her current medications.

## 2019-06-28 NOTE — Telephone Encounter (Signed)
Called patient back and explained the referral process and that the referral was refaxed today to other fax numbers. I gave her the [place] appointment number as well. She will wait another week and call be back if she does not hear anything else.

## 2019-06-28 NOTE — Telephone Encounter (Signed)
Called [facility] to inquire on the referral.   Spoke with scheduler, Herschel Senegal, who informed me that the fax may take a week or more before they actually receive the faxed request. I confirmed fax number and the first number was not the number she receives faxes on. She recommended I fax to [phone].  I also called the HOCM clinic at [phone], option 0. They have not received any fax either. [place] fax is [phone].   Refaxed referral, demographics and office notes to [phone] and to [phone].

## 2019-06-28 NOTE — Telephone Encounter (Signed)
Patient returning call to discuss below.  

## 2019-06-28 NOTE — Telephone Encounter (Signed)
No answer with patient. Left message to call back so I may update patient.

## 2019-06-29 NOTE — Progress Notes (Signed)
Cardiology Office Note    Date:  07/02/2019   ID:  Julie Bender, DOB [birth year], MRN [id number]  PCP:  [facility]  Cardiologist:  Ida Rogue, MD  Electrophysiologist:  None   Chief Complaint: Follow-up  History of Present Illness:   Julie Bender is a 58 y.o. female with history of hypertropic obstructive cardiomyopathy s/p ICD, nonobstructive CAD by [place] in 03/2016 and again in 06/2019, pulmonary hypertension, mitral valve regurgitation, IDDM, HTN, OSA not treated with CPAP secondary to financial issues, and obesity who presents for follow-up of diagnostic cath.   Patient was previously followed by [facility].  She underwent diagnostic cath in 03/2016 that did not reveal any significant CAD with markedly elevated filling pressures and then end-diastolic pressure around 40 mmHg with a wedge pressure of 25 mmHg, mean pulmonary pressure of 41 mmHg, mid cavitary gradient 40 to 60 mmHg, no SAM.  Echo in 04/2017 while admitted to [facility] for acute on chronic diastolic CHF showed an EF greater than 70%, severe LV wall thickness, hyperdynamic LV with mid cavity obliteration, mid ventricular gradient 41 mm rate increase to 62 mmHg with Valsalva, mild RV dilatation with normal function, moderate biatrial enlargement, moderate to severe mitral regurgitation, sclerotic aortic valve, moderate tricuspid regurgitation, PASP 42 to 47 mmHg.  She has subsequently transitioned care to [facility] after admission in 04/2018 for acute on chronic diastolic CHF.  Echo during that admission showed an EF of 55 to 60%, moderate concentric LVH with the septal wall appearing to be moderate to severe with mild LVOT obstruction, normal wall motion, grade 2 diastolic dysfunction, trivial aortic regurgitation, mild to moderate mitral regurgitation, mildly dilated left atrium, normal RV systolic function.  She was readmitted to the hospital in 02/2019 with acute respiratory distress with hypoxia in the setting of  COPD exacerbation and acute on chronic diastolic CHF treated with IV steroids and IV diuresis.  She was seen by her primary cardiologist in follow-up in 02/2019 and reported chronic shortness of breath with a reported dry weight approximately 215 pounds.  ReDs vest score that day was 31%.  She reported longstanding chronic chest pain.  BP was soft in the 0000000 systolic leading her verapamil to be decreased to 240 mg daily.  She was continued on torsemide 60 mg daily.  More recently, in early 05/2019 she was admitted with shortness of breath and cough consistent with COPD exacerbation with recommendation to proceed with diagnostic right and left heart cath down the road.  She was readmitted shortly after discharge in mid 05/2019 with continued shortness of breath and cough that had persisted for several weeks with concern for possible COVID-19 in spite of testing negative.  She appeared euvolemic and symptoms were not felt to be primarily cardiac in etiology.  High-sensitivity troponin minimally elevated with a peak of 90 and downtrending.  She subsequently tested positive for rhinovirus.  Echo showed a hyperdynamic LV systolic function with an EF greater than 75%, severely increased LVH, elevated mean left atrial pressure, diastolic dysfunction,, mildly dilated left atrium, degenerative mitral valve with mild mitral regurgitation, overall technically difficult study.  Plan was for right and left cardiac cath once she recovered from rhinovirus infection.  Documented discharge weight of 123XX123 kg with uncertain validity.    She was seen in hospital follow-up on 10/29 noting improvement in her shortness of breath.  She did note ongoing fatigue and generalized malaise.  She denied any device shocks.  She reported she was taking 80  mg of torsemide rather than the previously recommended 60 mg.  She underwent diagnostic right and left cardiac cath on 06/19/2019 which showed no angiographically significant CAD, mildly to  moderately elevated right heart filling pressures, moderately elevated left heart filling pressures, moderate to severe pulmonary hypertension, severely reduced cardiac output/index, significant LV intracavitary gradient (50 to 55 mmHg at rest, 85 to 90 mmHg post PVC.  It was recommended the patient continue rate control with metoprolol and diltiazem.  She was continued on torsemide 80 mg daily.  She was referred to the hypertrophic cardiomyopathy clinic at [facility] for further work-up and management.   Patient comes in doing reasonably well, though does feel like she is holding onto some fluid in her abdomen over the past 1 to 2 weeks with associated two-pillow orthopnea.  She indicates her baseline/dry weight is 203 pounds with a current weight of 206 pounds.  She does not feel like she has had vigorous urine output on torsemide 80 mg daily.  She reports she previously had good urine output while she was taking torsemide with spironolactone.  She reports compliance with all medications, low-sodium diet, and drinking less than 2 L of fluid daily.  No lower extremity swelling.  No chest pain, palpitations, dizziness, presyncope, or syncope.  No device shocks.  She has not yet heard from [place] clinic.   Labs: 06/2019 - potassium 3.3, BUN 18, serum creatinine 1.13 05/2019 - WBC 16.4, Hgb 13.9, PLT 328, magnesium 2.3, AST/ALT normal, albumin 3.5, TSH normal, A1c 7.2   Past Medical History:  Diagnosis Date   Asthma    CHF (congestive heart failure) (HCC)    COPD (chronic obstructive pulmonary disease) ([place])    Diabetes mellitus without complication ([place])    Diabetic retinopathy ([place])    History of placement of internal cardiac defibrillator    Hypertension    Hypertrophic cardiomyopathy ([place])     Past Surgical History:  Procedure Laterality Date   CARDIAC DEFIBRILLATOR PLACEMENT     CHOLECYSTECTOMY     RIGHT/LEFT HEART CATH AND CORONARY ANGIOGRAPHY N/A 06/19/2019   Procedure:  RIGHT/LEFT HEART CATH AND CORONARY ANGIOGRAPHY;  Surgeon: Nelva Bush, MD;  Location: [facility];  Service: Cardiovascular;  Laterality: N/A;    Current Medications: Current Meds  Medication Sig   acetaminophen (TYLENOL) 500 MG tablet Take 1,000 mg by mouth every 6 (six) hours as needed.   albuterol (PROVENTIL HFA;VENTOLIN HFA) 108 (90 Base) MCG/ACT inhaler Inhale 2 puffs into the lungs every 6 (six) hours as needed for wheezing or shortness of breath.   aspirin EC 81 MG tablet Take 81 mg by mouth daily.   atorvastatin (LIPITOR) 40 MG tablet Take 1 tablet (40 mg total) by mouth daily at 6 PM. (Patient taking differently: Take 40 mg by mouth daily. )   chlorpheniramine-HYDROcodone (TUSSIONEX) 10-8 MG/5ML SUER Take 5 mLs by mouth every 12 (twelve) hours as needed for cough.   citalopram (CELEXA) 20 MG tablet Take 20 mg by mouth daily.   cyanocobalamin 1000 MCG tablet Take 1,000 mcg by mouth daily.   diltiazem (CARDIZEM) 60 MG tablet Take 1 tablet (60 mg total) by mouth every 8 (eight) hours.   gabapentin (NEURONTIN) 300 MG capsule Take 1 capsule (300 mg total) by mouth 2 (two) times daily.   insulin aspart (NOVOLOG FLEXPEN) 100 UNIT/ML FlexPen Inject 13 Units into the skin 3 (three) times daily with meals.    insulin detemir (LEVEMIR) 100 UNIT/ML injection Inject 42 Units into  the skin at bedtime.   metFORMIN (GLUCOPHAGE) 850 MG tablet Take 850 mg by mouth 3 (three) times daily.   metoprolol tartrate (LOPRESSOR) 25 MG tablet Take 1 tablet (25 mg total) by mouth 2 (two) times daily.   nitroGLYCERIN (NITROSTAT) 0.4 MG SL tablet Place 1 tablet (0.4 mg total) under the tongue every 5 (five) minutes as needed for chest pain.   oxyCODONE (OXY IR/ROXICODONE) 5 MG immediate release tablet Take 1 tablet (5 mg total) by mouth every 6 (six) hours as needed for moderate pain.   pantoprazole (PROTONIX) 40 MG tablet Take 1 tablet (40 mg total) by mouth 2 (two) times daily before a  meal.   potassium chloride SA (KLOR-CON) 20 MEQ tablet Take 2 tablets (40 meq) by mouth x 1 dose today, then take 1 tablet (20 meq) by mouth once daily (Patient taking differently: Take 20 mEq by mouth daily. 1 tablet (20 meq) by mouth once daily)   torsemide (DEMADEX) 20 MG tablet Take 4 tablets (80 mg total) by mouth daily.   traZODone (DESYREL) 150 MG tablet Take 150 mg by mouth at bedtime.   umeclidinium-vilanterol (ANORO ELLIPTA) 62.5-25 MCG/INH AEPB Inhale 1 puff into the lungs daily.    Allergies:   Patient has no known allergies.   Social History   Socioeconomic History   Marital status: Divorced    Spouse name: Not on file   Number of children: Not on file   Years of education: Not on file   Highest education level: Not on file  Occupational History   Not on file  Social Needs   Financial resource strain: Not on file   Food insecurity    Worry: Not on file    Inability: Not on file   Transportation needs    Medical: Not on file    Non-medical: Not on file  Tobacco Use   Smoking status: Passive Smoke Exposure - Never Smoker   Smokeless tobacco: Never Used  Substance and Sexual Activity   Alcohol use: No   Drug use: Never   Sexual activity: Not on file  Lifestyle   Physical activity    Days per week: Not on file    Minutes per session: Not on file   Stress: Not on file  Relationships   Social connections    Talks on phone: Not on file    Gets together: Not on file    Attends religious service: Not on file    Active member of club or organization: Not on file    Attends meetings of clubs or organizations: Not on file    Relationship status: Not on file  Other Topics Concern   Not on file  Social History Narrative   Not on file     Family History:  The patient's family history includes Hypertrophic cardiomyopathy in her sister.  ROS:   Review of Systems  Constitutional: Positive for malaise/fatigue. Negative for chills, diaphoresis,  fever and weight loss.  HENT: Negative for congestion.   Eyes: Negative for discharge and redness.  Respiratory: Positive for cough and shortness of breath. Negative for hemoptysis, sputum production and wheezing.   Cardiovascular: Positive for orthopnea. Negative for chest pain, palpitations, claudication, leg swelling and PND.  Gastrointestinal: Negative for abdominal pain, blood in stool, heartburn, melena, nausea and vomiting.  Genitourinary: Negative for hematuria.  Musculoskeletal: Negative for falls and myalgias.  Skin: Negative for rash.  Neurological: Positive for weakness. Negative for dizziness, tingling, tremors, sensory change, speech change,  focal weakness and loss of consciousness.  Endo/Heme/Allergies: Does not bruise/bleed easily.  Psychiatric/Behavioral: Negative for substance abuse. The patient is not nervous/anxious.   All other systems reviewed and are negative.    EKGs/Labs/Other Studies Reviewed:    Studies reviewed were summarized above. The additional studies were reviewed today:  2D echo 05/21/2019: 1. Left ventricular ejection fraction, by visual estimation, is >75%. The left ventricle has hyperdynamic function. There is severely increased left ventricular hypertrophy.  2. Definity contrast agent was given IV to delineate the left ventricular endocardial borders.  3. Elevated mean left atrial pressure.  4. Hypertrophic cardiomyopathy.  5. Left ventricular diastolic Doppler parameters are consistent with pseudonormalization pattern of LV diastolic filling.  6. Global right ventricle was not well visualized.The right ventricular size is not well visualized. No increase in right ventricular wall thickness.  7. Left atrial size was mildly dilated.  8. Right atrial size was not well visualized.  9. The pericardium was not well visualized. 10. Mild aortic valve annular calcification. 11. The mitral valve is degenerative. Mild mitral valve regurgitation. 12. The  tricuspid valve is not well visualized. Tricuspid valve regurgitation was not visualized by color flow Doppler. 13. The aortic valve was not well visualized Aortic valve regurgitation is trivial by color flow Doppler. Mild aortic valve sclerosis without stenosis. 14. The pulmonic valve was not well visualized. Pulmonic valve regurgitation is not visualized by color flow Doppler. 15. The inferior vena cava is normal in size with greater than 50% respiratory variability, suggesting right atrial pressure of 3 mmHg. 16. The interatrial septum was not well visualized. __________  [facility] 06/19/2019: Conclusions: 1. No angiographically significant coronary artery disease. 2. Mildly to moderately elevated right heart filling pressures. 3. Moderately elevated left heart filling pressures. 4. Moderate to severe pulmonary hypertension. 5. Severely reduced Fick cardiac output/index. 6. Significant left ventricular intracavitary gradient (50 to 55 mmHg at rest, 85 to 90 mmHg post PVC).  Recommendations: 1. Continue rate control with metoprolol and diltiazem. 2. Increase torsemide to 80 mg daily with follow-up BMP in 1 week.  Follow-up in the clinic with Dr. Rockey Situ or APP in proximately 2 weeks. 3. Refer to hypertrophic cardiomyopathy clinic at [facility] for further work-up and management of significant hypertrophic obstructive cardiomyopathy and diastolic heart failure.   EKG:  EKG is ordered today.  The EKG ordered today demonstrates NSR, 64 bpm, rare PAC, LVH, prolonged QTC, poor R wave progression on the precordial leads, nonspecific lateral ST-T changes, largely unchanged from prior  Recent Labs: 05/21/2019: TSH 0.654 06/01/2019: ALT 11; B Natriuretic Peptide 358.0 06/06/2019: Magnesium 2.3 06/14/2019: Hemoglobin 13.9; Platelets 328 06/26/2019: BUN 18; Creatinine, Ser 1.13; Potassium 3.3; Sodium 139  Recent Lipid Panel    Component Value Date/Time   CHOL 90 09/05/2016 0314   TRIG 60 09/05/2016 0314    HDL 30 (L) 09/05/2016 0314   CHOLHDL 3.0 09/05/2016 0314   VLDL 12 09/05/2016 0314   LDLCALC 48 09/05/2016 0314    PHYSICAL EXAM:    VS:  BP 120/70 (BP Location: Right Arm, Patient Position: Sitting, Cuff Size: Large)    Pulse 64    Temp (!) 97.3 F (36.3 C)    Ht 5\' 4"  (1.626 m)    Wt 206 lb 12 oz (93.8 kg)    SpO2 97%    BMI 35.49 kg/m   BMI: Body mass index is 35.49 kg/m.  Physical Exam  Constitutional: She is oriented to person, place, and time. She appears well-developed and  well-nourished.  HENT:  Head: Normocephalic and atraumatic.  Eyes: Right eye exhibits no discharge. Left eye exhibits no discharge.  Neck: Normal range of motion. No JVD present.  Cardiovascular: Normal rate, regular rhythm, S1 normal and S2 normal. Exam reveals no distant heart sounds, no friction rub, no midsystolic click and no opening snap.  Murmur heard. Pulses:      Posterior tibial pulses are 2+ on the right side and 2+ on the left side.  2/6 systolic murmur heard throughout  Pulmonary/Chest: Effort normal and breath sounds normal. No respiratory distress. She has no decreased breath sounds. She has no wheezes. She has no rales. She exhibits no tenderness.  Abdominal: Soft. She exhibits distension. There is no abdominal tenderness.  Musculoskeletal:        General: No edema.  Neurological: She is alert and oriented to person, place, and time.  Skin: Skin is warm and dry. No cyanosis. Nails show no clubbing.  Psychiatric: She has a normal mood and affect. Her speech is normal and behavior is normal. Judgment and thought content normal.    Wt Readings from Last 3 Encounters:  07/02/19 206 lb 12 oz (93.8 kg)  06/19/19 203 lb (92.1 kg)  06/14/19 210 lb 4 oz (95.4 kg)     ASSESSMENT & PLAN:   1. Hypertrophic obstructive cardiomyopathy/acute on chronic HFpEF/pulmonary hypertension: She is mildly volume up with mild abdominal distention.  NYHA class II.  She does feel like she had good urine  output with combination of torsemide and spironolactone previously.  Given this, we will check a stat BMP today.  If labs allow we will plan to restart her on spironolactone 25 mg daily.  She will continue current dose torsemide 80 mg daily.  Caution undertaken to not overdiuresis.  Continue rate control with diltiazem and metoprolol.  She has been referred to the [place] clinic.  Our clinical staff will follow up on the referral.  She has been referred to EP for management of her ICD.  2. Nonobstructive CAD: No symptoms concerning for angina.  Recent cath on 06/19/2019 showed no angiographically significant CAD.  Continue current medications including aspirin, Lipitor, and metoprolol.  Risk factor modification.  No plans for further ischemic evaluation at this time.  3. Normocytic anemia: Improved hemoglobin on recent check in 05/2019.  Recommend maintaining adequate hemoglobin in the setting of her HOCM.  4. Mitral regurgitation: Mild to moderate by most recent echo.  Continue to monitor clinically.  5. HTN: Blood pressure is well controlled.  Continue current medications as outlined above.  6. Morbid obesity/OSA: Weight loss advised.  Continued compliance with CPAP is recommended.  Disposition: F/u with Dr. Rockey Situ or an APP in 1 month.   Medication Adjustments/Labs and Tests Ordered: Current medicines are reviewed at length with the patient today.  Concerns regarding medicines are outlined above. Medication changes, Labs and Tests ordered today are summarized above and listed in the Patient Instructions accessible in Encounters.   Signed, Christell Faith, PA-C 07/02/2019 2:11 PM     [address] [place], [place] [address] [phone]

## 2019-07-02 ENCOUNTER — Other Ambulatory Visit
Admission: RE | Admit: 2019-07-02 | Discharge: 2019-07-02 | Disposition: A | Discharge status: Home / Self Care | Payer: Medicare Other | Source: Ambulatory Visit | Attending: Physician Assistant | Admitting: Physician Assistant

## 2019-07-02 ENCOUNTER — Other Ambulatory Visit: Payer: Self-pay | Admitting: Physician Assistant

## 2019-07-02 ENCOUNTER — Encounter: Payer: Self-pay | Admitting: Physician Assistant

## 2019-07-02 ENCOUNTER — Other Ambulatory Visit: Payer: Self-pay

## 2019-07-02 ENCOUNTER — Ambulatory Visit (INDEPENDENT_AMBULATORY_CARE_PROVIDER_SITE_OTHER): Payer: Medicare Other | Admitting: Physician Assistant

## 2019-07-02 VITALS — BP 120/70 | HR 64 | Temp 97.3°F | Ht 64.0 in | Wt 206.8 lb

## 2019-07-02 DIAGNOSIS — D649 Anemia, unspecified: Secondary | ICD-10-CM | POA: Diagnosis not present

## 2019-07-02 DIAGNOSIS — I421 Obstructive hypertrophic cardiomyopathy: Secondary | ICD-10-CM | POA: Diagnosis not present

## 2019-07-02 DIAGNOSIS — I5033 Acute on chronic diastolic (congestive) heart failure: Secondary | ICD-10-CM | POA: Diagnosis not present

## 2019-07-02 DIAGNOSIS — I2510 Atherosclerotic heart disease of native coronary artery without angina pectoris: Secondary | ICD-10-CM

## 2019-07-02 DIAGNOSIS — I272 Pulmonary hypertension, unspecified: Secondary | ICD-10-CM

## 2019-07-02 DIAGNOSIS — I10 Essential (primary) hypertension: Secondary | ICD-10-CM

## 2019-07-02 DIAGNOSIS — I340 Nonrheumatic mitral (valve) insufficiency: Secondary | ICD-10-CM

## 2019-07-02 LAB — BASIC METABOLIC PANEL
Anion gap: 16 — ABNORMAL HIGH (ref 5–15)
BUN: 16 mg/dL (ref 6–20)
CO2: 27 mmol/L (ref 22–32)
Calcium: 9.2 mg/dL (ref 8.9–10.3)
Chloride: 99 mmol/L (ref 98–111)
Creatinine, Ser: 1.04 mg/dL — ABNORMAL HIGH (ref 0.44–1.00)
GFR calc Af Amer: >60 mL/min (ref >60)
GFR calc non Af Amer: 58 mL/min — ABNORMAL LOW (ref >60)
Glucose, Bld: 89 mg/dL (ref 70–99)
Potassium: 2.9 mmol/L — ABNORMAL LOW (ref 3.5–5.1)
Sodium: 142 mmol/L (ref 135–145)

## 2019-07-02 MED ORDER — SPIRONOLACTONE 25 MG PO TABS: 25.0000 mg | ORAL_TABLET | Freq: Every day | ORAL | 2 refills | Status: DC

## 2019-07-02 NOTE — Patient Instructions (Signed)
Medication Instructions:   1. Your physician recommends that you continue on your current medications as directed. Please refer to the Current Medication list given to you today.  *If you need a refill on your cardiac medications before your next appointment, please call your pharmacy*  Lab Work:  1. Your physician recommends that you have lab work TODAY at the medical mall. (BMET) If you have labs (blood work) drawn today and your tests are completely normal, you will receive your results only by: Marland Kitchen MyChart Message (if you have MyChart) OR . A paper copy in the mail If you have any lab test that is abnormal or we need to change your treatment, we will call you to review the results.  Testing/Procedures:  1. None Ordered  Follow-Up: At [facility], you and your health needs are our priority.  As part of our continuing mission to provide you with exceptional heart care, we have created designated Provider Care Teams.  These Care Teams include your primary Cardiologist (physician) and Advanced Practice Providers (APPs -  Physician Assistants and Nurse Practitioners) who all work together to provide you with the care you need, when you need it.  Your next appointment:   1 month   The format for your next appointment:   In Person  Provider:  You may see Ida Rogue, MD or Christell Faith, PA-C

## 2019-07-03 ENCOUNTER — Telehealth: Payer: Self-pay | Admitting: Internal Medicine

## 2019-07-03 NOTE — Telephone Encounter (Signed)
Please call regarding referral. Needs images on CD of right and left heart cath from 06/19/19 and echocardiogram

## 2019-07-03 NOTE — Telephone Encounter (Signed)
Spoke with Joseph Art at [place]. CD of cath and echo may be sent to: [facility] [place], [place] [address] [phone] Attn: Lenetta Quaker Ex Account ID [id number]  Patient's appointment is this Monday, 07/09/19.  It would be nice if the CD's can be there in the next day or two per [facility].  Message sent to [place] in echo requesting the echo images on CD to be sent to the above. Will call cath lab to request as well.

## 2019-07-03 NOTE — Telephone Encounter (Signed)
Patient has appt on Monday, 07/09/19 at [facility].

## 2019-07-03 NOTE — Telephone Encounter (Signed)
Spoke with Jeneen Rinks at [facility] lab.  They are able to send the cath via [place] function. He will do this.

## 2019-07-09 NOTE — Telephone Encounter (Signed)
Called [facility] and the operator took message for me and will pass it on to Dr Leland Her nurse. Notified him that patient's cath report will be in [place] for viewing.

## 2019-07-18 ENCOUNTER — Telehealth: Payer: Self-pay | Admitting: Internal Medicine

## 2019-07-18 NOTE — Telephone Encounter (Signed)
Ryan,   See note. Can we cancel referral to Dr Caryl Comes?

## 2019-07-18 NOTE — Telephone Encounter (Signed)
Spoke with patient. She is not established with an EP doctor at [facility] yet. She would like to keep primary cards and EP here with [place]. She will plan to keep appointments as scheduled. Routing to scheduling to move up appointment sooner with Dr Caryl Comes if an opening comes up.

## 2019-07-18 NOTE — Telephone Encounter (Signed)
Attempted to schedule ep eval sooner.  Per patient :   She was seen by Dr. Mina Marble at [facility] and is preparing to have surgery before the end of the year.  She wants to know if this EP eval with [facility] is still needed.

## 2019-07-18 NOTE — Telephone Encounter (Signed)
If she has established with [facility] EP and HOCM clinic, it is not needed.

## 2019-08-01 ENCOUNTER — Telehealth: Payer: Self-pay | Admitting: Cardiovascular Disease

## 2019-08-01 NOTE — Telephone Encounter (Signed)
Patient calling in to see if she needs to keep her upcoming appointment on 08/06/19. Patient will be going to [facility] 08/03/19 for surgery consult and chest x-ray for her pending open heart surgery. Please advise patient on what is most appropriate.

## 2019-08-01 NOTE — Telephone Encounter (Signed)
Spoke with patient and she states that she is having surgery at [facility] for her HOCM. She wanted to cancel her upcoming appointment pending her surgery and discussion about follow up if needed after that. Advised that she can certainly give [place] a call back to reschedule appointment if needed post procedure if she does not continue follow up with [facility] providers. She was appreciative for the call back, no further questions, and will call back if she needs follow up in future.

## 2019-08-06 ENCOUNTER — Ambulatory Visit: Payer: Medicare Other | Admitting: Cardiovascular Disease

## 2019-08-08 NOTE — Telephone Encounter (Signed)
Calling in from Dr. Despina Pole office regarding referral placed by our office. Please call [phone] when able

## 2019-08-08 NOTE — Telephone Encounter (Signed)
Called Dr. Farley Ly office at the number provided and left a message for Butch Penny to call the office back

## 2019-08-13 ENCOUNTER — Telehealth: Payer: Self-pay

## 2019-08-13 NOTE — Telephone Encounter (Signed)
I called the pt to get her ICD model and serial numbers. The numbers she provided was not correct. The pt asked me if she should cancel her appointment with Dr. Caryl Comes until after her open heart surgery. I told her her we can not blindly follow her device without her being seen by an EP doctor. I told her until they actually schedule her surgery she should keep her upcoming appointment with Dr. Caryl Comes. I asked the pt who is following her device now and she stated no one. I called [employer] to get her ICD serial and model number.

## 2019-08-13 NOTE — Telephone Encounter (Signed)
  1. Has your device fired? Na   2. Is you device beeping? Na   3. Are you experiencing draining or swelling at device site? Na   4. Are you calling to see if we received your device transmission? Na   5. Have you passed out? NA  [facility] calling to check device information prior to MRI needed for surgery        Please route to [place]

## 2019-08-13 NOTE — Telephone Encounter (Signed)
LMOM at number listed for [facility] to call device clinic and # provided. Patient has not been seen in our clinic and no device info available. Will see Dr Caryl Comes 08/2019.

## 2019-08-14 NOTE — Telephone Encounter (Signed)
Spoke with Butch Penny at [place] and she states that she already found the information she needed for patient to have MRI with her device. She was appreciative for the call back with no further questions at this time.

## 2019-08-14 NOTE — Telephone Encounter (Signed)
Left voicemail message for them to please call back.

## 2019-08-23 ENCOUNTER — Ambulatory Visit (INDEPENDENT_AMBULATORY_CARE_PROVIDER_SITE_OTHER): Payer: Medicare Other | Admitting: Internal Medicine

## 2019-08-23 ENCOUNTER — Other Ambulatory Visit: Payer: Self-pay

## 2019-08-23 ENCOUNTER — Encounter: Payer: Self-pay | Admitting: Internal Medicine

## 2019-08-23 VITALS — BP 106/70 | HR 59 | Ht 64.0 in | Wt 207.0 lb

## 2019-08-23 DIAGNOSIS — I248 Other forms of acute ischemic heart disease: Secondary | ICD-10-CM

## 2019-08-23 DIAGNOSIS — I422 Other hypertrophic cardiomyopathy: Secondary | ICD-10-CM | POA: Diagnosis not present

## 2019-08-23 NOTE — Progress Notes (Signed)
ELECTROPHYSIOLOGY CONSULT NOTE  Patient ID: Julie Bender, MRN: [id number], DOB/AGE: 03-18-1961 59 y.o. Admit date: (Not on file) Date of Consult: 08/23/2019  Primary Physician: Julie Catalan, MD Primary Cardiologist: Julie Bender is a 59 y.o. female who is being seen today for the evaluation of ICD at the request of Julie Bender.    HPI Julie Bender is a 59 y.o. 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  The cardiomyopathy was identified in the cohort after her son was diagnosed.  Her other son also has HCM.  He is on dialysis.  She has had significant problems with recurrent congestive heart failure and COPD exacerbations requiring hospitalization.  Recently she has been seen in consultation by Dr. Mina Bender at [facility] who consider disopyramide as an adjunct but has recommended that she proceed with myectomy which is scheduled for later this month at [facility].  She has hypertension which is well controlled.  Moderately obese.  Significant dyspnea on exertion.  Occasional peripheral edema.  No history of ICD discharges  Past Medical History:  Diagnosis Date  . Asthma   . CHF (congestive heart failure) ([place])   . COPD (chronic obstructive pulmonary disease) ([place])   . Diabetes mellitus without complication ([place])   . Diabetic retinopathy ([place])   . History of placement of internal cardiac defibrillator   . Hypertension   . Hypertrophic cardiomyopathy [facility])       Surgical History:  Past Surgical History:  Procedure Laterality Date  . CARDIAC DEFIBRILLATOR PLACEMENT    . CHOLECYSTECTOMY    . RIGHT/LEFT HEART CATH AND CORONARY ANGIOGRAPHY N/A 06/19/2019   Procedure: RIGHT/LEFT HEART CATH AND CORONARY ANGIOGRAPHY;  Surgeon: Nelva Bush, MD;  Location: [place] CV LAB;  Service: Cardiovascular;  Laterality: N/A;     Home Meds: Current Meds  Medication Sig  . acetaminophen (TYLENOL) 500 MG tablet Take 1,000  mg by mouth every 6 (six) hours as needed.  Marland Kitchen albuterol (PROVENTIL HFA;VENTOLIN HFA) 108 (90 Base) MCG/ACT inhaler Inhale 2 puffs into the lungs every 6 (six) hours as needed for wheezing or shortness of breath.  Marland Kitchen aspirin EC 81 MG tablet Take 81 mg by mouth daily.  Marland Kitchen atorvastatin (LIPITOR) 40 MG tablet Take 1 tablet (40 mg total) by mouth daily at 6 PM. (Patient taking differently: Take 40 mg by mouth daily. )  . chlorpheniramine-HYDROcodone (TUSSIONEX) 10-8 MG/5ML SUER Take 5 mLs by mouth every 12 (twelve) hours as needed for cough.  . citalopram (CELEXA) 20 MG tablet Take 20 mg by mouth daily.  . cyanocobalamin 1000 MCG tablet Take 1,000 mcg by mouth daily.  . insulin aspart (NOVOLOG FLEXPEN) 100 UNIT/ML FlexPen Inject 13 Units into the skin 3 (three) times daily with meals.   . insulin detemir (LEVEMIR) 100 UNIT/ML injection Inject 42 Units into the skin at bedtime.  . metFORMIN (GLUCOPHAGE) 850 MG tablet Take 850 mg by mouth 3 (three) times daily.  . metoprolol tartrate (LOPRESSOR) 25 MG tablet Take 1 tablet (25 mg total) by mouth 2 (two) times daily.  . nitroGLYCERIN (NITROSTAT) 0.4 MG SL tablet Place 1 tablet (0.4 mg total) under the tongue every 5 (five) minutes as needed for chest pain.  Marland Kitchen oxyCODONE (OXY IR/ROXICODONE) 5 MG immediate release tablet Take 1 tablet (5 mg total) by mouth every 6 (six) hours as needed for moderate pain.  . potassium chloride SA (KLOR-CON) 20 MEQ  tablet Take 2 tablets (40 meq) by mouth x 1 dose today, then take 1 tablet (20 meq) by mouth once daily  . spironolactone (ALDACTONE) 25 MG tablet Take 1 tablet (25 mg total) by mouth daily.  Marland Kitchen torsemide (DEMADEX) 20 MG tablet Take 4 tablets (80 mg total) by mouth daily.  . traZODone (DESYREL) 150 MG tablet Take 150 mg by mouth at bedtime.  Marland Kitchen umeclidinium-vilanterol (ANORO ELLIPTA) 62.5-25 MCG/INH AEPB Inhale 1 puff into the lungs daily.    Allergies: No Known Allergies  Social History   Socioeconomic History  .  Marital status: Divorced    Spouse name: Not on file  . Number of children: Not on file  . Years of education: Not on file  . Highest education level: Not on file  Occupational History  . Not on file  Tobacco Use  . Smoking status: Passive Smoke Exposure - Never Smoker  . Smokeless tobacco: Never Used  Substance and Sexual Activity  . Alcohol use: No  . Drug use: Never  . Sexual activity: Not on file  Other Topics Concern  . Not on file  Social History Narrative  . Not on file   Social Determinants of Health   Financial Resource Strain:   . Difficulty of Paying Living Expenses: Not on file  Food Insecurity:   . Worried About Charity fundraiser in the Last Year: Not on file  . Ran Out of Food in the Last Year: Not on file  Transportation Needs:   . Lack of Transportation (Medical): Not on file  . Lack of Transportation (Non-Medical): Not on file  Physical Activity:   . Days of Exercise per Week: Not on file  . Minutes of Exercise per Session: Not on file  Stress:   . Feeling of Stress : Not on file  Social Connections:   . Frequency of Communication with Friends and Family: Not on file  . Frequency of Social Gatherings with Friends and Family: Not on file  . Attends Religious Services: Not on file  . Active Member of Clubs or Organizations: Not on file  . Attends Archivist Meetings: Not on file  . Marital Status: Not on file  Intimate Partner Violence:   . Fear of Current or Ex-Partner: Not on file  . Emotionally Abused: Not on file  . Physically Abused: Not on file  . Sexually Abused: Not on file     Family History  Problem Relation Age of Onset  . Hypertrophic cardiomyopathy Sister      ROS:  Please see the history of present illness.     All other systems reviewed and negative.    Physical Exam  Blood pressure 106/70, pulse (!) 59, height 5\' 4"  (1.626 m), weight 207 lb (93.9 kg), SpO2 98 %. General: Well developed, well nourished female in no  acute distress. Head: Normocephalic, atraumatic, sclera non-icteric, no xanthomas, nares are without discharge. EENT: normal  Lymph Nodes:  none Neck: Negative for carotid bruits. JVD10 Back:without scoliosis kyphosis Lungs: Clear bilaterally to auscultation without wheezes, rales, or rhonchi. Breathing is unlabored. Heart: RRR with S1 S2. 3/6 systolic murmur . No rubs, or gallops appreciated. Abdomen: Soft, non-tender, non-distended with normoactive bowel sounds. No hepatomegaly. No rebound/guarding. No obvious abdominal masses. Msk:  Strength and tone appear normal for age. Extremities: No clubbing or cyanosis. No  edema.  Distal pedal pulses are 2+ and equal bilaterally. Skin: Warm and Dry Neuro: Alert and oriented X 3. CN III-XII  intact Grossly normal sensory and motor function . Psych:  Responds to questions appropriately with a normal affect.      Labs: Cardiac Enzymes No results for input(s): CKTOTAL, CKMB, TROPONINI in the last 72 hours. CBC Lab Results  Component Value Date   WBC 16.4 (H) 06/14/2019   HGB 13.9 06/14/2019   HCT 43.1 06/14/2019   MCV 83 06/14/2019   PLT 328 06/14/2019   PROTIME: No results for input(s): LABPROT, INR in the last 72 hours. Chemistry No results for input(s): NA, K, CL, CO2, BUN, CREATININE, CALCIUM, PROT, BILITOT, ALKPHOS, ALT, AST, GLUCOSE in the last 168 hours.  Invalid input(s): LABALBU Lipids Lab Results  Component Value Date   CHOL 90 09/05/2016   HDL 30 (L) 09/05/2016   LDLCALC 48 09/05/2016   TRIG 60 09/05/2016   BNP No results found for: PROBNP Thyroid Function Tests: No results for input(s): TSH, T4TOTAL, T3FREE, THYROIDAB in the last 72 hours.  Invalid input(s): FREET3 Miscellaneous No results found for: DDIMER  Radiology/Studies:  No results found.  EKG: Sinus rhythm with LVH and repolarization abnormalities   Assessment and Plan:   Hypertrophic Cardiomyopathy-obstructive with SAM  Congestive heart failure  class IIIb  ICD-dual-chamber-Medtronic    Patient has a dual-chamber ICD implanted.  It is not clear to me what the indications were.  Septum was 2.6 cm.  She is noted to have nonsustained ventricular tachycardia on her device and presumably this was part of her indication.  She also has some delayed gadolinium enhancement; percentages not identified as best as I can tell from MRI 2015.  She is scheduled this month for surgical myectomy at [facility] under the care of Dr. Mina Bender and [place].  She is excited  We will set her up for remotes.  We will plan to see her again in 1 years time.     Virl Axe

## 2019-08-23 NOTE — Patient Instructions (Signed)
Medication Instructions:  Your physician recommends that you continue on your current medications as directed. Please refer to the Current Medication list given to you today.  *If you need a refill on your cardiac medications before your next appointment, please call your pharmacy*  Lab Work: None ordered If you have labs (blood work) drawn today and your tests are completely normal, you will receive your results only by: Marland Kitchen MyChart Message (if you have MyChart) OR . A paper copy in the mail If you have any lab test that is abnormal or we need to change your treatment, we will call you to review the results.  Testing/Procedures: None ordered  Follow-Up: At [facility], you and your health needs are our priority.  As part of our continuing mission to provide you with exceptional heart care, we have created designated Provider Care Teams.  These Care Teams include your primary Cardiologist (physician) and Advanced Practice Providers (APPs -  Physician Assistants and Nurse Practitioners) who all work together to provide you with the care you need, when you need it.  Your next appointment:   1 year(s)  The format for your next appointment:   In Person  Provider:   Virl Axe, MD  Other Instructions NA

## 2019-08-31 ENCOUNTER — Telehealth: Payer: Self-pay | Admitting: Internal Medicine

## 2019-08-31 ENCOUNTER — Telehealth: Payer: Self-pay

## 2019-08-31 ENCOUNTER — Ambulatory Visit (INDEPENDENT_AMBULATORY_CARE_PROVIDER_SITE_OTHER): Payer: Medicare Other | Admitting: *Deleted

## 2019-08-31 DIAGNOSIS — Z95810 Presence of automatic (implantable) cardiac defibrillator: Secondary | ICD-10-CM | POA: Diagnosis not present

## 2019-08-31 NOTE — Telephone Encounter (Signed)
ICD transmission added to schedule for processing. No alerts.

## 2019-08-31 NOTE — Telephone Encounter (Signed)
I got the pt to send a transmission with her home monitor. I told her if everything looks normal the nurse will not give her a call but if she see anything the nurse will call. I told her no news is good news with [place]. I explained to her I wanted the transmission to make sure the monitor is working. I told her since we got the transmission all she will have to do is sleep by her monitor and it will send automatically.  The pt verbalized understanding and thanked me for my help.

## 2019-08-31 NOTE — Telephone Encounter (Signed)
-----   Message from Emily Filbert, RN sent at 08/30/2019  3:42 PM EST ----- This patient saw Dr. Caryl Comes on 08/23/19 for the 1st time. She was a referral from Dr. Rockey Situ to est Device Follow up. I'm not sure where she was implanted (? [facility]/ [facility]). I think her most recent follow up was with [facility].   Please establish remotes with you guys!  Thanks!

## 2019-08-31 NOTE — Telephone Encounter (Signed)
Spoke with Julie Bender regarding home health orders. I explained to her pt has only seen Dr. Caryl Comes once and most recently on 1/07. I referred her to pt's primary care physician to address. She had no additional questions.

## 2019-08-31 NOTE — Telephone Encounter (Signed)
I called the pt to see if she received the home monitor I ordered for her.  Per [facility] the monitor was shipped 09-13-2018.

## 2019-08-31 NOTE — Telephone Encounter (Signed)
Julie Bender from [facility] requesting Dr. Caryl Comes to sign the patients home health orders. She states they date back to 06/11/2019 and they have 4 orders.

## 2019-09-01 LAB — CUP PACEART REMOTE DEVICE CHECK
Battery Remaining Longevity: 46 mo
Battery Voltage: 2.97 V
Brady Statistic AP VP Percent: 0.02 %
Brady Statistic AP VS Percent: 21.12 %
Brady Statistic AS VP Percent: 0.05 %
Brady Statistic AS VS Percent: 78.82 %
Brady Statistic RA Percent Paced: 21.1 %
Brady Statistic RV Percent Paced: 0.06 %
Date Time Interrogation Session: 20210115144451
HighPow Impedance: 39 Ohm
HighPow Impedance: 45 Ohm
Implantable Lead Implant Date: 20150407
Implantable Lead Implant Date: 20150407
Implantable Lead Location: 753859
Implantable Lead Location: 753860
Implantable Lead Model: 5076
Implantable Pulse Generator Implant Date: 20150407
Lead Channel Impedance Value: 380 Ohm
Lead Channel Impedance Value: 380 Ohm
Lead Channel Impedance Value: 456 Ohm
Lead Channel Pacing Threshold Amplitude: 0.5 V
Lead Channel Pacing Threshold Amplitude: 0.875 V
Lead Channel Pacing Threshold Pulse Width: 0.4 ms
Lead Channel Pacing Threshold Pulse Width: 0.4 ms
Lead Channel Sensing Intrinsic Amplitude: 1.875 mV
Lead Channel Sensing Intrinsic Amplitude: 1.875 mV
Lead Channel Sensing Intrinsic Amplitude: 31.625 mV
Lead Channel Sensing Intrinsic Amplitude: 31.625 mV
Lead Channel Setting Pacing Amplitude: 1.5 V
Lead Channel Setting Pacing Amplitude: 2 V
Lead Channel Setting Pacing Pulse Width: 0.4 ms
Lead Channel Setting Sensing Sensitivity: 0.3 mV

## 2019-12-24 MED ORDER — DSS 100 MG PO CAPS
100.00 | ORAL_CAPSULE | ORAL | Status: DC
Start: 2020-01-07 — End: 2019-12-24

## 2019-12-24 MED ORDER — INSULIN LISPRO 100 UNIT/ML ~~LOC~~ SOLN
0.00 | SUBCUTANEOUS | Status: DC
Start: 2019-12-26 — End: 2019-12-24

## 2019-12-24 MED ORDER — PANTOPRAZOLE SODIUM 40 MG PO TBEC
40.00 | DELAYED_RELEASE_TABLET | ORAL | Status: DC
Start: 2020-01-08 — End: 2019-12-24

## 2019-12-24 MED ORDER — ATORVASTATIN CALCIUM 20 MG PO TABS
20.00 | ORAL_TABLET | ORAL | Status: DC
Start: 2020-01-07 — End: 2019-12-24

## 2019-12-24 MED ORDER — LIDOCAINE 5 % EX PTCH
3.00 | MEDICATED_PATCH | CUTANEOUS | Status: DC
Start: 2020-01-08 — End: 2019-12-24

## 2019-12-24 MED ORDER — INSULIN LISPRO 100 UNIT/ML ~~LOC~~ SOLN
2.00 | SUBCUTANEOUS | Status: DC
Start: 2019-12-26 — End: 2019-12-24

## 2019-12-24 MED ORDER — MIDODRINE HCL 5 MG PO TABS
15.00 | ORAL_TABLET | ORAL | Status: DC
Start: 2019-12-26 — End: 2019-12-24

## 2019-12-24 MED ORDER — INSULIN GLARGINE 100 UNIT/ML ~~LOC~~ SOLN
14.00 | SUBCUTANEOUS | Status: DC
Start: 2019-12-27 — End: 2019-12-24

## 2019-12-24 MED ORDER — MENTHOL 7.6 MG MT LOZG
1.00 | LOZENGE | OROMUCOSAL | Status: DC
Start: ? — End: 2019-12-24

## 2019-12-24 MED ORDER — GENERIC EXTERNAL MEDICATION
Status: DC
Start: 2019-12-26 — End: 2019-12-24

## 2019-12-24 MED ORDER — GABAPENTIN 100 MG PO CAPS
100.00 | ORAL_CAPSULE | ORAL | Status: DC
Start: 2019-12-26 — End: 2019-12-24

## 2019-12-24 MED ORDER — INSULIN LISPRO 100 UNIT/ML ~~LOC~~ SOLN
2.00 | SUBCUTANEOUS | Status: DC
Start: 2019-12-27 — End: 2019-12-24

## 2019-12-24 MED ORDER — ZINC SULFATE 220 (50 ZN) MG PO CAPS
220.00 | ORAL_CAPSULE | ORAL | Status: DC
Start: 2019-12-27 — End: 2019-12-24

## 2019-12-24 MED ORDER — FORMOTEROL FUMARATE 20 MCG/2ML IN NEBU
20.00 | INHALATION_SOLUTION | RESPIRATORY_TRACT | Status: DC
Start: 2019-12-26 — End: 2019-12-24

## 2019-12-24 MED ORDER — ACETAMINOPHEN 325 MG PO TABS
650.00 | ORAL_TABLET | ORAL | Status: DC
Start: ? — End: 2019-12-24

## 2019-12-24 MED ORDER — ASPIRIN 81 MG PO CHEW
81.00 | CHEWABLE_TABLET | ORAL | Status: DC
Start: 2020-01-08 — End: 2019-12-24

## 2019-12-24 MED ORDER — SERTRALINE HCL 50 MG PO TABS
100.00 | ORAL_TABLET | ORAL | Status: DC
Start: 2020-01-08 — End: 2019-12-24

## 2019-12-24 MED ORDER — GENERIC EXTERNAL MEDICATION
6.25 | Status: DC
Start: 2019-12-24 — End: 2019-12-24

## 2019-12-24 MED ORDER — ONDANSETRON HCL 4 MG/2ML IJ SOLN
4.00 | INTRAMUSCULAR | Status: DC
Start: ? — End: 2019-12-24

## 2019-12-24 MED ORDER — GLUCAGON (RDNA) 1 MG IJ KIT
1.00 | PACK | INTRAMUSCULAR | Status: DC
Start: ? — End: 2019-12-24

## 2019-12-24 MED ORDER — HEPARIN SODIUM (PORCINE) 1000 UNIT/ML IJ SOLN
INTRAMUSCULAR | Status: DC
Start: ? — End: 2019-12-24

## 2019-12-24 MED ORDER — EPOETIN ALFA-EPBX 4000 UNIT/ML IJ SOLN
50.00 | INTRAMUSCULAR | Status: DC
Start: 2019-12-28 — End: 2019-12-24

## 2019-12-24 MED ORDER — CAMPHOR-MENTHOL 0.5-0.5 % EX LOTN
TOPICAL_LOTION | CUTANEOUS | Status: DC
Start: ? — End: 2019-12-24

## 2019-12-24 MED ORDER — DEXTROSE 50 % IV SOLN
12.50 | INTRAVENOUS | Status: DC
Start: ? — End: 2019-12-24

## 2019-12-24 MED ORDER — HYDROCORTISONE 1 % EX CREA
TOPICAL_CREAM | CUTANEOUS | Status: DC
Start: ? — End: 2019-12-24

## 2019-12-24 MED ORDER — TRAZODONE HCL 50 MG PO TABS
25.00 | ORAL_TABLET | ORAL | Status: DC
Start: 2020-01-07 — End: 2019-12-24

## 2019-12-24 MED ORDER — APIXABAN 5 MG PO TABS
5.00 | ORAL_TABLET | ORAL | Status: DC
Start: 2019-12-26 — End: 2019-12-24

## 2019-12-24 MED ORDER — OXYCODONE HCL 5 MG PO TABS
5.00 | ORAL_TABLET | ORAL | Status: DC
Start: ? — End: 2019-12-24

## 2019-12-24 MED ORDER — AMIODARONE HCL 200 MG PO TABS
200.00 | ORAL_TABLET | ORAL | Status: DC
Start: 2019-12-27 — End: 2019-12-24

## 2019-12-24 MED ORDER — RENA-VITE PO TABS
1.00 | ORAL_TABLET | ORAL | Status: DC
Start: 2020-01-08 — End: 2019-12-24

## 2019-12-24 MED ORDER — MELATONIN 3 MG PO TBDP
6.00 | ORAL_TABLET | ORAL | Status: DC
Start: 2020-01-07 — End: 2019-12-24

## 2019-12-24 MED ORDER — BUDESONIDE 0.5 MG/2ML IN SUSP
0.50 | RESPIRATORY_TRACT | Status: DC
Start: 2019-12-26 — End: 2019-12-24

## 2019-12-24 MED ORDER — IPRATROPIUM-ALBUTEROL 0.5-2.5 (3) MG/3ML IN SOLN
3.00 | RESPIRATORY_TRACT | Status: DC
Start: ? — End: 2019-12-24

## 2019-12-26 MED ORDER — METOPROLOL SUCCINATE ER 25 MG PO TB24
12.50 | ORAL_TABLET | ORAL | Status: DC
Start: 2019-12-27 — End: 2019-12-26

## 2020-01-07 MED ORDER — HYDROMORPHONE HCL 2 MG PO TABS
2.00 | ORAL_TABLET | ORAL | Status: DC
Start: ? — End: 2020-01-07

## 2020-01-07 MED ORDER — INSULIN LISPRO 100 UNIT/ML ~~LOC~~ SOLN
4.00 | SUBCUTANEOUS | Status: DC
Start: 2020-01-08 — End: 2020-01-07

## 2020-01-07 MED ORDER — MIDODRINE HCL 5 MG PO TABS
10.00 | ORAL_TABLET | ORAL | Status: DC
Start: 2020-01-07 — End: 2020-01-07

## 2020-01-07 MED ORDER — APIXABAN 5 MG PO TABS
5.00 | ORAL_TABLET | ORAL | Status: DC
Start: 2020-01-07 — End: 2020-01-07

## 2020-01-07 MED ORDER — INSULIN LISPRO 100 UNIT/ML ~~LOC~~ SOLN
4.00 | SUBCUTANEOUS | Status: DC
Start: 2020-01-07 — End: 2020-01-07

## 2020-01-07 MED ORDER — CAMPHOR-MENTHOL 0.5-0.5 % EX LOTN
TOPICAL_LOTION | CUTANEOUS | Status: DC
Start: ? — End: 2020-01-07

## 2020-01-07 MED ORDER — POLYETHYLENE GLYCOL 3350 17 GM/SCOOP PO POWD
17.00 | ORAL | Status: DC
Start: 2020-01-08 — End: 2020-01-07

## 2020-01-07 MED ORDER — ACETAMINOPHEN 325 MG PO TABS
975.00 | ORAL_TABLET | ORAL | Status: DC
Start: 2020-01-07 — End: 2020-01-07

## 2020-01-14 ENCOUNTER — Other Ambulatory Visit: Payer: Self-pay | Admitting: Internal Medicine

## 2020-01-14 DIAGNOSIS — J449 Chronic obstructive pulmonary disease, unspecified: Secondary | ICD-10-CM

## 2020-01-14 DIAGNOSIS — G4733 Obstructive sleep apnea (adult) (pediatric): Secondary | ICD-10-CM

## 2020-01-14 DIAGNOSIS — J9621 Acute and chronic respiratory failure with hypoxia: Secondary | ICD-10-CM

## 2020-01-14 DIAGNOSIS — N186 End stage renal disease: Secondary | ICD-10-CM

## 2020-01-14 DIAGNOSIS — I5032 Chronic diastolic (congestive) heart failure: Secondary | ICD-10-CM

## 2020-01-14 DIAGNOSIS — I422 Other hypertrophic cardiomyopathy: Secondary | ICD-10-CM

## 2020-01-14 NOTE — Progress Notes (Signed)
[place] NOTE  PULMONARY SERVICE ROUNDS  Date of Service: 01/14/2020  Julie Bender  DOB: 1961/01/03  Referring physician: Deanne Coffer, MD  HPI: Julie Bender is a 59 y.o. female  being seen for Acute on Chronic Respiratory Failure.  Patient currently is on capping has been on nasal cannula also getting hemodialysis on 2 L of O2.  She is doing quite well  Review of Systems: Unremarkable other than noted in HPI  Allergies:  Reviewed on the [facility]  Medications: Reviewed  Vitals: Temperature is 98.5 pulse 75 respiratory 19 blood pressure is 110/80 saturations 97%  Ventilator Settings: Capping off the ventilator without distress  Physical Exam: . General:  calm and comfortable NAD . Eyes: normal lids, irises & conjunctiva . ENT: grossly normal tongue not enlarged . Neck: no masses . Cardiovascular: S1 S2 Normal no rubs no gallop . Respiratory: No rhonchi no rales are noted at this time . Abdomen: soft non-distended . Skin: no rash seen on limited exam . Musculoskeletal:  no rigidity . Psychiatric: unable to assess . Neurologic: no involuntary movements          Lab Data and radiological Data:  Sodium 133 potassium 3.8 BUN 29 creatinine 2.0 White count 8.0 hemoglobin 8.7 hematocrit 29.6 platelet count 226   Assessment/Plan  Patient Active Problem List   Diagnosis Date Noted  . Acute on chronic respiratory failure with hypoxia ([place])   . Obstructive sleep apnea   . Morbid obesity ([place])   . Chronic heart failure with preserved ejection fraction ([place])   . Hypertrophic cardiomyopathy ([place])   . End stage renal disease on dialysis ([place])   . Chronic obstructive asthma ([place])       1. Acute on chronic respiratory failure hypoxia doing well with complaint patient is currently on capping trials with nasal cannula on 2 L of O2.  She was on hemodialysis this morning and was tolerating it well. 2. End-stage renal disease currently is on  hemodialysis we will continue to monitor closely. 3. Obstructive sleep apnea by history she will likely need to use positive airway pressure because of her history of obstructive sleep apnea. 4. Chronic obstructive asthma right now appears to be controlled we will use nebulizers as tolerated.  She should try to resume her baseline inhalers that she was on prior to the acute illness. 5. Morbid obesity dietary management 6. Chronic heart failure preserved ejection fraction patient has history of hypertrophic cardiomyopathy will need to monitor fluid status   I have personally evaluated the patient, evaluated the laboratory and imaging results and formulated the assessment and plan and placed orders as needed. The Patient requires high complexity decision making with multiple system involvement. Rounds were done with the Respiratory Therapy Director and respiratory therapist involved in the care of the patient as well as nursing staff.   Allyne Gee, MD [facility] Pulmonary Critical Care Medicine   This note is for inpatient care

## 2020-01-15 ENCOUNTER — Other Ambulatory Visit: Payer: Self-pay | Admitting: Internal Medicine

## 2020-01-15 DIAGNOSIS — I422 Other hypertrophic cardiomyopathy: Secondary | ICD-10-CM

## 2020-01-15 DIAGNOSIS — Z930 Tracheostomy status: Secondary | ICD-10-CM

## 2020-01-15 DIAGNOSIS — J9601 Acute respiratory failure with hypoxia: Secondary | ICD-10-CM

## 2020-01-15 DIAGNOSIS — G4733 Obstructive sleep apnea (adult) (pediatric): Secondary | ICD-10-CM

## 2020-01-15 NOTE — Progress Notes (Signed)
[place] NOTE  PULMONARY SERVICE ROUNDS  Date of Service: 01/15/2020  Marikay Roads  DOB: May 12, 1961  Referring physician: Deanne Coffer, MD  HPI: Evvie Behrmann is a 59 y.o. female  being seen for Acute on Chronic Respiratory Failure.  Patient continues to do fine with capping she apparently has a history of sleep apnea but she states that she has not really used the CPAP device.  Spoke to her at length today about using it while she is here and she is agreeable.  Patient is going to be started on CPAP BiPAP and will see how she does at this time.  For now I will hold off on decannulation  Review of Systems: Unremarkable other than noted in HPI  Allergies:  Reviewed on the [facility]  Medications: Reviewed  Vitals: Temperature is 98.6 pulse 75 respiratory 18 blood pressure is 119/76 saturations 97%  Ventilator Settings: On capping greater than 72 hours  Physical Exam: . General:  calm and comfortable NAD . Eyes: normal lids, irises & conjunctiva . ENT: grossly normal tongue not enlarged . Neck: no masses . Cardiovascular: S1 S2 Normal no rubs no gallop . Respiratory: No rhonchi coarse breath sounds . Abdomen: soft non-distended . Skin: no rash seen on limited exam . Musculoskeletal:  no rigidity . Psychiatric: unable to assess . Neurologic: no involuntary movements          Lab Data and radiological Data:  No labs today   Assessment/Plan  Patient Active Problem List   Diagnosis Date Noted  . Generalized anxiety disorder   . Asthma exacerbation 06/01/2019  . Atypical chest pain 05/21/2019  . Chest pain 05/19/2019  . Acute respiratory failure with hypoxia ([place]) 02/23/2019  . Obesity 05/10/2018  . Dyspnea 05/05/2018  . Thyroid nodule 05/05/2018  . OSA (obstructive sleep apnea) 04/30/2018  . Hypertensive heart disease with heart failure ([place]) 04/30/2018  . Acute respiratory distress 04/18/2018  . Chronic right shoulder pain 02/27/2018   . Elevated troponin 05/10/2017  . Bronchitis 09/06/2016  . Demand ischemia ([place]) 09/06/2016  . Hypertrophic cardiomyopathy ([place]) 09/06/2016  . Routine history and physical examination of adult 08/24/2016  . Volume overload 04/20/2016  . Cardiac defibrillator in place 01/30/2014  . Depression 01/30/2014  . Insomnia 11/29/2012  . Essential hypertension 01/20/2011  . Diabetes mellitus ([place]) 10/31/2008      1. Acute on chronic respiratory failure with hypoxia patient has history of sleep apnea she needs to be on the positive airway pressure therapy and then we will work on decannulation. 2. Hypertrophic cardiomyopathy right now she is stable we will continue to follow along. 3. Obstructive sleep apnea will be starting on Pap therapy 4. Tracheostomy status for now she will remain capping we will continue with supportive care   I have personally evaluated the patient, evaluated the laboratory and imaging results and formulated the assessment and plan and placed orders as needed. The Patient requires high complexity decision making with multiple system involvement. Rounds were done with the Respiratory Therapy Director and respiratory therapist involved in the care of the patient as well as nursing staff.   Allyne Gee, MD [facility] Pulmonary Critical Care Medicine   This note is for inpatient care

## 2020-01-16 ENCOUNTER — Encounter: Payer: Self-pay | Admitting: Internal Medicine

## 2020-01-16 ENCOUNTER — Other Ambulatory Visit: Payer: Self-pay | Admitting: Internal Medicine

## 2020-01-16 DIAGNOSIS — N186 End stage renal disease: Secondary | ICD-10-CM | POA: Insufficient documentation

## 2020-01-16 DIAGNOSIS — J9621 Acute and chronic respiratory failure with hypoxia: Secondary | ICD-10-CM | POA: Insufficient documentation

## 2020-01-16 DIAGNOSIS — J449 Chronic obstructive pulmonary disease, unspecified: Secondary | ICD-10-CM | POA: Insufficient documentation

## 2020-01-16 DIAGNOSIS — I422 Other hypertrophic cardiomyopathy: Secondary | ICD-10-CM

## 2020-01-16 DIAGNOSIS — I5032 Chronic diastolic (congestive) heart failure: Secondary | ICD-10-CM

## 2020-01-16 DIAGNOSIS — G4733 Obstructive sleep apnea (adult) (pediatric): Secondary | ICD-10-CM | POA: Insufficient documentation

## 2020-01-16 NOTE — Progress Notes (Signed)
[place] NOTE  PULMONARY SERVICE ROUNDS  Date of Service: 01/16/2020  Julie Bender  DOB: [birth year]  Referring physician: Deanne Coffer, MD  HPI: Julie Bender is a 59 y.o. female  being seen for Acute on Chronic Respiratory Failure.  Patient right now is comfortable without distress at this time decannulated yesterday doing well  Review of Systems: Unremarkable other than noted in HPI  Allergies:  Reviewed on the [facility]  Medications: Reviewed  Vitals: Temperature is 97.6 pulse 76 respiratory rate 20 blood pressure is 129/70 saturations are 98%  Ventilator Settings: Decannulated using the BiPAP at nighttime  Physical Exam: . General:  calm and comfortable NAD . Eyes: normal lids, irises & conjunctiva . ENT: grossly normal tongue not enlarged . Neck: no masses . Cardiovascular: S1 S2 Normal no rubs no gallop . Respiratory: No rhonchi no rales are noted at this time . Abdomen: soft non-distended . Skin: no rash seen on limited exam . Musculoskeletal:  no rigidity . Psychiatric: unable to assess . Neurologic: no involuntary movements          Lab Data and radiological Data:  Sodium 133 potassium 3.7 BUN 16 creatinine 2.3 White count 7.5 hemoglobin 8.9 hematocrit 29 platelet count 222   Assessment/Plan  Patient Active Problem List   Diagnosis Date Noted  . Acute on chronic respiratory failure with hypoxia ([place])   . Obstructive sleep apnea   . Morbid obesity ([place])   . Chronic heart failure with preserved ejection fraction ([place])   . Hypertrophic cardiomyopathy ([place])       1. Acute on chronic respiratory failure with hypoxia patient successfully decannulated she needs to continue using the BiPAP at nighttime however. 2. Obstructive sleep apnea encourage compliance with Pap therapy we will continue with supportive care. 3. Morbid obesity dietary management we will continue to follow along. 4. Chronic heart failure preserved ejection  fraction secondary to hypertensive disease she needs to have this continue to be followed along also has history of hypertrophic cardiomyopathy. 5. Chronic obstructive asthma on nebulizers medications as necessary   I have personally evaluated the patient, evaluated the laboratory and imaging results and formulated the assessment and plan and placed orders as needed. The Patient requires high complexity decision making with multiple system involvement. Rounds were done with the Respiratory Therapy Director and respiratory therapist involved in the care of the patient as well as nursing staff.   Allyne Gee, MD [facility] Pulmonary Critical Care Medicine   This note is for inpatient care

## 2020-01-17 ENCOUNTER — Other Ambulatory Visit (HOSPITAL_COMMUNITY): Payer: Medicare Other | Admitting: Internal Medicine

## 2020-01-17 DIAGNOSIS — N186 End stage renal disease: Secondary | ICD-10-CM | POA: Diagnosis not present

## 2020-01-17 DIAGNOSIS — Z992 Dependence on renal dialysis: Secondary | ICD-10-CM

## 2020-01-17 DIAGNOSIS — G4733 Obstructive sleep apnea (adult) (pediatric): Secondary | ICD-10-CM

## 2020-01-17 DIAGNOSIS — J449 Chronic obstructive pulmonary disease, unspecified: Secondary | ICD-10-CM | POA: Diagnosis not present

## 2020-01-17 DIAGNOSIS — J9621 Acute and chronic respiratory failure with hypoxia: Secondary | ICD-10-CM

## 2020-01-17 DIAGNOSIS — I5032 Chronic diastolic (congestive) heart failure: Secondary | ICD-10-CM | POA: Diagnosis not present

## 2020-01-17 DIAGNOSIS — I422 Other hypertrophic cardiomyopathy: Secondary | ICD-10-CM

## 2020-01-17 NOTE — Progress Notes (Signed)
[place] NOTE  PULMONARY SERVICE ROUNDS  Date of Service: 01/17/2020  Julie Bender  DOB: November 07, 1960  Referring physician: Deanne Coffer, MD  HPI: Julie Bender is a 59 y.o. female  being seen for Acute on Chronic Respiratory Failure.  Patient is decannulated doing well she is using her BiPAP she states she used it at night without any issues  Review of Systems: Unremarkable other than noted in HPI  Allergies:  Reviewed on the [facility]  Medications: Reviewed  Vitals: Temperature 96.6 pulse 73 respiratory rate 16 blood pressure is 112/74 saturations 98%  Ventilator Settings: Temperature is 96.6 pulse 73 respiratory rate 16 blood pressure is 119/74 saturations 98%  Physical Exam: . General:  calm and comfortable NAD . Eyes: normal lids, irises & conjunctiva . ENT: grossly normal tongue not enlarged . Neck: no masses . Cardiovascular: S1 S2 Normal no rubs no gallop . Respiratory: Scattered rhonchi expansion is equal . Abdomen: soft non-distended . Skin: no rash seen on limited exam . Musculoskeletal:  no rigidity . Psychiatric: unable to assess . Neurologic: no involuntary movements          Lab Data and radiological Data:  No labs today   Assessment/Plan  Patient Active Problem List   Diagnosis Date Noted  . Acute on chronic respiratory failure with hypoxia ([place])   . Obstructive sleep apnea   . Morbid obesity ([place])   . Chronic heart failure with preserved ejection fraction ([place])   . Hypertrophic cardiomyopathy ([place])   . End stage renal disease on dialysis ([place])   . Chronic obstructive asthma ([place])       1. Acute on chronic respiratory failure with hypoxia we will continue with BiPAP at nighttime titrate as tolerated continue pulmonary toilet 2. Obstructive sleep apnea patient is using BiPAP will continue 3. Chronic heart failure preserved ejection fraction hemodynamics are stable we will continue to monitor. 4. Chronic asthma  stable medical management 5. End-stage renal failure on hemodialysis we will continue to monitor   I have personally evaluated the patient, evaluated the laboratory and imaging results and formulated the assessment and plan and placed orders as needed. The Patient requires high complexity decision making with multiple system involvement. Rounds were done with the Respiratory Therapy Director and respiratory therapist involved in the care of the patient as well as nursing staff.   Allyne Gee, MD [facility] Pulmonary Critical Care Medicine   This note is for inpatient care

## 2020-01-18 ENCOUNTER — Other Ambulatory Visit (HOSPITAL_COMMUNITY): Payer: Medicare Other | Admitting: Internal Medicine

## 2020-01-18 DIAGNOSIS — I5032 Chronic diastolic (congestive) heart failure: Secondary | ICD-10-CM | POA: Diagnosis not present

## 2020-01-18 DIAGNOSIS — I422 Other hypertrophic cardiomyopathy: Secondary | ICD-10-CM

## 2020-01-18 DIAGNOSIS — J449 Chronic obstructive pulmonary disease, unspecified: Secondary | ICD-10-CM

## 2020-01-18 DIAGNOSIS — J9621 Acute and chronic respiratory failure with hypoxia: Secondary | ICD-10-CM

## 2020-01-18 DIAGNOSIS — N186 End stage renal disease: Secondary | ICD-10-CM

## 2020-01-18 DIAGNOSIS — J4489 Other specified chronic obstructive pulmonary disease: Secondary | ICD-10-CM

## 2020-01-18 DIAGNOSIS — Z992 Dependence on renal dialysis: Secondary | ICD-10-CM

## 2020-01-18 DIAGNOSIS — G4733 Obstructive sleep apnea (adult) (pediatric): Secondary | ICD-10-CM

## 2020-01-18 NOTE — Progress Notes (Signed)
[place] NOTE  PULMONARY SERVICE ROUNDS  Date of Service: 01/18/2020  Julie Bender  DOB: Nov 22, 1960  Referring physician: Deanne Coffer, MD  HPI: Julie Bender is a 59 y.o. female  being seen for Acute on Chronic Respiratory Failure.  Patient is doing fine decannulated she is having some issues with compliance with BiPAP but once again had a conversation to get her to be more compliant  Review of Systems: Unremarkable other than noted in HPI  Allergies:  Reviewed on the [facility]  Medications: Reviewed  Vitals: Temperature 98.6 pulse 76 respiratory rate 16 blood pressure is 147/60 saturations 93%  Ventilator Settings: Decannulated off the ventilator  Physical Exam: . General:  calm and comfortable NAD . Eyes: normal lids, irises & conjunctiva . ENT: grossly normal tongue not enlarged . Neck: no masses . Cardiovascular: S1 S2 Normal no rubs no gallop . Respiratory: No rhonchi coarse breath sounds . Abdomen: soft non-distended . Skin: no rash seen on limited exam . Musculoskeletal:  no rigidity . Psychiatric: unable to assess . Neurologic: no involuntary movements          Lab Data and radiological Data:  Sodium 134 potassium 3.3 BUN is 18 creatinine 2.3 White count 7.1 hemoglobin 9.2 hematocrit 30.8 platelet count 213   Assessment/Plan  Patient Active Problem List   Diagnosis Date Noted  . Acute on chronic respiratory failure with hypoxia ([place])   . Obstructive sleep apnea   . Morbid obesity ([place])   . Chronic heart failure with preserved ejection fraction ([place])   . Hypertrophic cardiomyopathy ([place])   . End stage renal disease on dialysis ([place])   . Chronic obstructive asthma ([place])       1. Acute on chronic respiratory failure hypoxia plan is to continue with encouraging compliance with BiPAP at nighttime 2. Obstructive sleep apnea as above encourage compliance with BiPAP 3. Morbid obesity at baseline we will continue to  follow 4. Chronic heart failure preserved ejection fraction we will continue with supportive care 5. Hypertrophic cardiomyopathy monitor fluid status 6. End-stage renal disease on hemodialysis 7. Chronic asthma management supportive care   I have personally evaluated the patient, evaluated the laboratory and imaging results and formulated the assessment and plan and placed orders as needed. The Patient requires high complexity decision making with multiple system involvement. Rounds were done with the Respiratory Therapy Director and respiratory therapist involved in the care of the patient as well as nursing staff.   Allyne Gee, MD [facility] Pulmonary Critical Care Medicine   This note is for inpatient care

## 2020-01-19 ENCOUNTER — Other Ambulatory Visit (HOSPITAL_COMMUNITY): Payer: Medicare Other | Admitting: Internal Medicine

## 2020-01-19 DIAGNOSIS — J9621 Acute and chronic respiratory failure with hypoxia: Secondary | ICD-10-CM

## 2020-01-19 DIAGNOSIS — J449 Chronic obstructive pulmonary disease, unspecified: Secondary | ICD-10-CM | POA: Diagnosis not present

## 2020-01-19 DIAGNOSIS — J4489 Other specified chronic obstructive pulmonary disease: Secondary | ICD-10-CM

## 2020-01-19 DIAGNOSIS — N186 End stage renal disease: Secondary | ICD-10-CM | POA: Diagnosis not present

## 2020-01-19 DIAGNOSIS — I422 Other hypertrophic cardiomyopathy: Secondary | ICD-10-CM

## 2020-01-19 DIAGNOSIS — I5032 Chronic diastolic (congestive) heart failure: Secondary | ICD-10-CM

## 2020-01-19 DIAGNOSIS — G4733 Obstructive sleep apnea (adult) (pediatric): Secondary | ICD-10-CM

## 2020-01-19 DIAGNOSIS — Z992 Dependence on renal dialysis: Secondary | ICD-10-CM

## 2020-01-19 NOTE — Progress Notes (Signed)
[place] NOTE  PULMONARY SERVICE ROUNDS  Date of Service: 01/19/2020  Julie Bender  DOB: 15-Sep-1960  Referring physician: Deanne Coffer, MD  HPI: Julie Bender is a 59 y.o. female  being seen for Acute on Chronic Respiratory Failure.  Patient's decannulated comfortable without distress  Review of Systems: Unremarkable other than noted in HPI  Allergies:  Reviewed on the [facility]  Medications: Reviewed  Vitals: Temperature 96.8 pulse 75 respiratory 20 blood pressure is 09/08/1959 saturations 98%  Ventilator Settings: Decannulated  Physical Exam: . General:  calm and comfortable NAD . Eyes: normal lids, irises & conjunctiva . ENT: grossly normal tongue not enlarged . Neck: no masses . Cardiovascular: S1 S2 Normal no rubs no gallop . Respiratory: No rhonchi no rales . Abdomen: soft non-distended . Skin: no rash seen on limited exam . Musculoskeletal:  no rigidity . Psychiatric: unable to assess . Neurologic: no involuntary movements          Lab Data and radiological Data:  No labs to report   Assessment/Plan  Patient Active Problem List   Diagnosis Date Noted  . Acute on chronic respiratory failure with hypoxia ([place])   . Obstructive sleep apnea   . Morbid obesity ([place])   . Chronic heart failure with preserved ejection fraction ([place])   . Hypertrophic cardiomyopathy ([place])   . End stage renal disease on dialysis ([place])   . Chronic obstructive asthma ([place])       1. Acute on chronic respiratory failure hypoxia resolved patient is using BiPAP at nighttime encourage more compliance 2. Obstructive sleep apnea needs compliance with BiPAP 3. Morbid obesity no changes 4. Chronic heart failure preserved ejection fraction supportive care 5. Hypertrophic cardiomyopathy at baseline 6. End-stage renal disease on hemodialysis 7. Chronic asthma at baseline   I have personally evaluated the patient, evaluated the laboratory and imaging results  and formulated the assessment and plan and placed orders as needed. The Patient requires high complexity decision making with multiple system involvement. Rounds were done with the Respiratory Therapy Director and respiratory therapist involved in the care of the patient as well as nursing staff.   Allyne Gee, MD [facility] Pulmonary Critical Care Medicine   This note is for inpatient care

## 2020-03-30 ENCOUNTER — Other Ambulatory Visit: Payer: Self-pay

## 2020-03-30 ENCOUNTER — Emergency Department: Payer: Medicare Other

## 2020-03-30 ENCOUNTER — Encounter: Payer: Self-pay | Admitting: Emergency Medicine

## 2020-03-30 ENCOUNTER — Inpatient Hospital Stay
Admission: EM | Admit: 2020-03-30 | Discharge: 2020-04-18 | DRG: 291 | Disposition: A | Discharge status: Skilled Nursing Facility | Payer: Medicare Other | Source: Skilled Nursing Facility | Attending: Internal Medicine | Admitting: Internal Medicine

## 2020-03-30 DIAGNOSIS — E871 Hypo-osmolality and hyponatremia: Secondary | ICD-10-CM | POA: Diagnosis present

## 2020-03-30 DIAGNOSIS — N179 Acute kidney failure, unspecified: Secondary | ICD-10-CM | POA: Diagnosis not present

## 2020-03-30 DIAGNOSIS — N183 Chronic kidney disease, stage 3 unspecified: Secondary | ICD-10-CM | POA: Diagnosis present

## 2020-03-30 DIAGNOSIS — E1152 Type 2 diabetes mellitus with diabetic peripheral angiopathy with gangrene: Secondary | ICD-10-CM | POA: Diagnosis present

## 2020-03-30 DIAGNOSIS — F329 Major depressive disorder, single episode, unspecified: Secondary | ICD-10-CM | POA: Diagnosis present

## 2020-03-30 DIAGNOSIS — Z7722 Contact with and (suspected) exposure to environmental tobacco smoke (acute) (chronic): Secondary | ICD-10-CM | POA: Diagnosis present

## 2020-03-30 DIAGNOSIS — J9621 Acute and chronic respiratory failure with hypoxia: Secondary | ICD-10-CM | POA: Diagnosis present

## 2020-03-30 DIAGNOSIS — I10 Essential (primary) hypertension: Secondary | ICD-10-CM | POA: Diagnosis not present

## 2020-03-30 DIAGNOSIS — F32A Depression, unspecified: Secondary | ICD-10-CM | POA: Diagnosis present

## 2020-03-30 DIAGNOSIS — Z7901 Long term (current) use of anticoagulants: Secondary | ICD-10-CM

## 2020-03-30 DIAGNOSIS — Z6838 Body mass index (BMI) 38.0-38.9, adult: Secondary | ICD-10-CM

## 2020-03-30 DIAGNOSIS — E1122 Type 2 diabetes mellitus with diabetic chronic kidney disease: Secondary | ICD-10-CM | POA: Diagnosis present

## 2020-03-30 DIAGNOSIS — G4733 Obstructive sleep apnea (adult) (pediatric): Secondary | ICD-10-CM | POA: Diagnosis present

## 2020-03-30 DIAGNOSIS — E11649 Type 2 diabetes mellitus with hypoglycemia without coma: Secondary | ICD-10-CM | POA: Diagnosis not present

## 2020-03-30 DIAGNOSIS — J449 Chronic obstructive pulmonary disease, unspecified: Secondary | ICD-10-CM | POA: Diagnosis not present

## 2020-03-30 DIAGNOSIS — Z89432 Acquired absence of left foot: Secondary | ICD-10-CM

## 2020-03-30 DIAGNOSIS — N186 End stage renal disease: Secondary | ICD-10-CM

## 2020-03-30 DIAGNOSIS — Z66 Do not resuscitate: Secondary | ICD-10-CM | POA: Diagnosis not present

## 2020-03-30 DIAGNOSIS — R531 Weakness: Secondary | ICD-10-CM | POA: Diagnosis present

## 2020-03-30 DIAGNOSIS — Z20822 Contact with and (suspected) exposure to covid-19: Secondary | ICD-10-CM | POA: Diagnosis present

## 2020-03-30 DIAGNOSIS — Z825 Family history of asthma and other chronic lower respiratory diseases: Secondary | ICD-10-CM

## 2020-03-30 DIAGNOSIS — E785 Hyperlipidemia, unspecified: Secondary | ICD-10-CM | POA: Diagnosis present

## 2020-03-30 DIAGNOSIS — L97529 Non-pressure chronic ulcer of other part of left foot with unspecified severity: Secondary | ICD-10-CM | POA: Diagnosis present

## 2020-03-30 DIAGNOSIS — J45901 Unspecified asthma with (acute) exacerbation: Secondary | ICD-10-CM | POA: Diagnosis present

## 2020-03-30 DIAGNOSIS — I5033 Acute on chronic diastolic (congestive) heart failure: Secondary | ICD-10-CM | POA: Diagnosis present

## 2020-03-30 DIAGNOSIS — E11621 Type 2 diabetes mellitus with foot ulcer: Secondary | ICD-10-CM | POA: Diagnosis present

## 2020-03-30 DIAGNOSIS — E1129 Type 2 diabetes mellitus with other diabetic kidney complication: Secondary | ICD-10-CM | POA: Diagnosis not present

## 2020-03-30 DIAGNOSIS — D72829 Elevated white blood cell count, unspecified: Secondary | ICD-10-CM | POA: Diagnosis present

## 2020-03-30 DIAGNOSIS — N1831 Chronic kidney disease, stage 3a: Secondary | ICD-10-CM | POA: Diagnosis not present

## 2020-03-30 DIAGNOSIS — Z794 Long term (current) use of insulin: Secondary | ICD-10-CM

## 2020-03-30 DIAGNOSIS — N2581 Secondary hyperparathyroidism of renal origin: Secondary | ICD-10-CM | POA: Diagnosis present

## 2020-03-30 DIAGNOSIS — N170 Acute kidney failure with tubular necrosis: Secondary | ICD-10-CM | POA: Diagnosis present

## 2020-03-30 DIAGNOSIS — E11319 Type 2 diabetes mellitus with unspecified diabetic retinopathy without macular edema: Secondary | ICD-10-CM | POA: Diagnosis present

## 2020-03-30 DIAGNOSIS — Z7401 Bed confinement status: Secondary | ICD-10-CM

## 2020-03-30 DIAGNOSIS — I421 Obstructive hypertrophic cardiomyopathy: Secondary | ICD-10-CM | POA: Diagnosis present

## 2020-03-30 DIAGNOSIS — Z515 Encounter for palliative care: Secondary | ICD-10-CM | POA: Diagnosis not present

## 2020-03-30 DIAGNOSIS — E875 Hyperkalemia: Secondary | ICD-10-CM | POA: Diagnosis not present

## 2020-03-30 DIAGNOSIS — I248 Other forms of acute ischemic heart disease: Secondary | ICD-10-CM | POA: Diagnosis present

## 2020-03-30 DIAGNOSIS — Z8249 Family history of ischemic heart disease and other diseases of the circulatory system: Secondary | ICD-10-CM

## 2020-03-30 DIAGNOSIS — R7989 Other specified abnormal findings of blood chemistry: Secondary | ICD-10-CM | POA: Diagnosis present

## 2020-03-30 DIAGNOSIS — Z7189 Other specified counseling: Secondary | ICD-10-CM | POA: Diagnosis not present

## 2020-03-30 DIAGNOSIS — Z79899 Other long term (current) drug therapy: Secondary | ICD-10-CM

## 2020-03-30 DIAGNOSIS — Z9981 Dependence on supplemental oxygen: Secondary | ICD-10-CM | POA: Diagnosis not present

## 2020-03-30 DIAGNOSIS — I130 Hypertensive heart and chronic kidney disease with heart failure and stage 1 through stage 4 chronic kidney disease, or unspecified chronic kidney disease: Secondary | ICD-10-CM | POA: Diagnosis present

## 2020-03-30 DIAGNOSIS — R778 Other specified abnormalities of plasma proteins: Secondary | ICD-10-CM | POA: Diagnosis not present

## 2020-03-30 DIAGNOSIS — R34 Anuria and oliguria: Secondary | ICD-10-CM | POA: Diagnosis not present

## 2020-03-30 DIAGNOSIS — E876 Hypokalemia: Secondary | ICD-10-CM | POA: Diagnosis not present

## 2020-03-30 DIAGNOSIS — E669 Obesity, unspecified: Secondary | ICD-10-CM | POA: Diagnosis present

## 2020-03-30 DIAGNOSIS — Z89431 Acquired absence of right foot: Secondary | ICD-10-CM

## 2020-03-30 DIAGNOSIS — R0602 Shortness of breath: Secondary | ICD-10-CM

## 2020-03-30 DIAGNOSIS — Z452 Encounter for adjustment and management of vascular access device: Secondary | ICD-10-CM

## 2020-03-30 DIAGNOSIS — I5031 Acute diastolic (congestive) heart failure: Secondary | ICD-10-CM | POA: Diagnosis not present

## 2020-03-30 DIAGNOSIS — D631 Anemia in chronic kidney disease: Secondary | ICD-10-CM | POA: Diagnosis present

## 2020-03-30 DIAGNOSIS — I482 Chronic atrial fibrillation, unspecified: Secondary | ICD-10-CM | POA: Diagnosis present

## 2020-03-30 DIAGNOSIS — Z7982 Long term (current) use of aspirin: Secondary | ICD-10-CM

## 2020-03-30 DIAGNOSIS — Z95810 Presence of automatic (implantable) cardiac defibrillator: Secondary | ICD-10-CM

## 2020-03-30 DIAGNOSIS — I4581 Long QT syndrome: Secondary | ICD-10-CM | POA: Diagnosis present

## 2020-03-30 DIAGNOSIS — R230 Cyanosis: Secondary | ICD-10-CM | POA: Diagnosis present

## 2020-03-30 DIAGNOSIS — Z7951 Long term (current) use of inhaled steroids: Secondary | ICD-10-CM

## 2020-03-30 DIAGNOSIS — R5381 Other malaise: Secondary | ICD-10-CM | POA: Diagnosis present

## 2020-03-30 LAB — MRSA PCR SCREENING: MRSA by PCR: NEGATIVE

## 2020-03-30 LAB — SARS CORONAVIRUS 2 BY RT PCR (HOSPITAL ORDER, PERFORMED IN ~~LOC~~ HOSPITAL LAB): SARS Coronavirus 2: NEGATIVE

## 2020-03-30 LAB — GLUCOSE, CAPILLARY
Glucose-Capillary: 153 mg/dL — ABNORMAL HIGH (ref 70–99)
Glucose-Capillary: 182 mg/dL — ABNORMAL HIGH (ref 70–99)

## 2020-03-30 LAB — CBC WITH DIFFERENTIAL/PLATELET
Abs Immature Granulocytes: 0.04 10*3/uL (ref 0.00–0.07)
Basophils Absolute: 0.1 10*3/uL (ref 0.0–0.1)
Basophils Relative: 1 %
Eosinophils Absolute: 0.2 10*3/uL (ref 0.0–0.5)
Eosinophils Relative: 3 %
HCT: 27.1 % — ABNORMAL LOW (ref 36.0–46.0)
Hemoglobin: 8.3 g/dL — ABNORMAL LOW (ref 12.0–15.0)
Immature Granulocytes: 1 %
Lymphocytes Relative: 9 %
Lymphs Abs: 0.7 10*3/uL (ref 0.7–4.0)
MCH: 25.6 pg — ABNORMAL LOW (ref 26.0–34.0)
MCHC: 30.6 g/dL (ref 30.0–36.0)
MCV: 83.6 fL (ref 80.0–100.0)
Monocytes Absolute: 0.8 10*3/uL (ref 0.1–1.0)
Monocytes Relative: 11 %
Neutro Abs: 5.7 10*3/uL (ref 1.7–7.7)
Neutrophils Relative %: 75 %
Platelets: 236 10*3/uL (ref 150–400)
RBC: 3.24 MIL/uL — ABNORMAL LOW (ref 3.87–5.11)
RDW: 22.5 % — ABNORMAL HIGH (ref 11.5–15.5)
Smear Review: NORMAL
WBC: 7.5 10*3/uL (ref 4.0–10.5)
nRBC: 0 % (ref 0.0–0.2)

## 2020-03-30 LAB — COMPREHENSIVE METABOLIC PANEL
ALT: 22 U/L (ref 0–44)
AST: 53 U/L — ABNORMAL HIGH (ref 15–41)
Albumin: 3.4 g/dL — ABNORMAL LOW (ref 3.5–5.0)
Alkaline Phosphatase: 140 U/L — ABNORMAL HIGH (ref 38–126)
Anion gap: 10 (ref 5–15)
BUN: 23 mg/dL (ref 8–23)
CO2: 26 mmol/L (ref 22–32)
Calcium: 8.9 mg/dL (ref 8.9–10.3)
Chloride: 100 mmol/L (ref 98–111)
Creatinine, Ser: 1.37 mg/dL — ABNORMAL HIGH (ref 0.44–1.00)
GFR calc Af Amer: 48 mL/min — ABNORMAL LOW (ref >60)
GFR calc non Af Amer: 42 mL/min — ABNORMAL LOW (ref >60)
Glucose, Bld: 180 mg/dL — ABNORMAL HIGH (ref 70–99)
Potassium: 4.3 mmol/L (ref 3.5–5.1)
Sodium: 136 mmol/L (ref 135–145)
Total Bilirubin: 1.2 mg/dL (ref 0.3–1.2)
Total Protein: 7.5 g/dL (ref 6.5–8.1)

## 2020-03-30 LAB — TROPONIN I (HIGH SENSITIVITY)
Troponin I (High Sensitivity): 50 ng/L — ABNORMAL HIGH (ref <18)
Troponin I (High Sensitivity): 50 ng/L — ABNORMAL HIGH (ref <18)
Troponin I (High Sensitivity): 54 ng/L — ABNORMAL HIGH (ref <18)
Troponin I (High Sensitivity): 53 ng/L — ABNORMAL HIGH (ref <18)

## 2020-03-30 LAB — HEMOGLOBIN A1C
Hgb A1c MFr Bld: 6.7 % — ABNORMAL HIGH (ref 4.8–5.6)
Mean Plasma Glucose: 145.59 mg/dL

## 2020-03-30 LAB — BRAIN NATRIURETIC PEPTIDE: B Natriuretic Peptide: 1811.6 pg/mL — ABNORMAL HIGH (ref 0.0–100.0)

## 2020-03-30 LAB — MAGNESIUM: Magnesium: 1.8 mg/dL (ref 1.7–2.4)

## 2020-03-30 MED ORDER — MAGNESIUM SULFATE IN D5W 1-5 GM/100ML-% IV SOLN
1.0000 g | Freq: Once | INTRAVENOUS | Status: DC
  Filled 2020-03-30: qty 100

## 2020-03-30 MED ORDER — PANTOPRAZOLE SODIUM 40 MG PO TBEC
40.0000 mg | DELAYED_RELEASE_TABLET | Freq: Every day | ORAL | Status: DC
  Administered 2020-03-31 – 2020-04-18 (×19): 40 mg via ORAL
  Filled 2020-03-30 (×19): qty 1

## 2020-03-30 MED ORDER — ACETAMINOPHEN 325 MG PO TABS
650.0000 mg | ORAL_TABLET | ORAL | Status: DC | PRN
  Administered 2020-04-05: 650 mg via ORAL
  Filled 2020-03-30: qty 2

## 2020-03-30 MED ORDER — FUROSEMIDE 10 MG/ML IJ SOLN
60.0000 mg | Freq: Once | INTRAMUSCULAR | Status: AC
  Administered 2020-03-30: 60 mg via INTRAVENOUS
  Filled 2020-03-30: qty 8

## 2020-03-30 MED ORDER — AMIODARONE HCL 200 MG PO TABS
200.0000 mg | ORAL_TABLET | Freq: Every day | ORAL | Status: DC
  Administered 2020-03-31 – 2020-04-18 (×19): 200 mg via ORAL
  Filled 2020-03-30 (×19): qty 1

## 2020-03-30 MED ORDER — ONDANSETRON HCL 4 MG/2ML IJ SOLN: 4.0000 mg | Freq: Four times a day (QID) | INTRAMUSCULAR | Status: DC | PRN

## 2020-03-30 MED ORDER — ENOXAPARIN SODIUM 40 MG/0.4ML ~~LOC~~ SOLN: 40.0000 mg | SUBCUTANEOUS | Status: DC

## 2020-03-30 MED ORDER — METOPROLOL SUCCINATE ER 25 MG PO TB24
12.5000 mg | ORAL_TABLET | Freq: Every day | ORAL | Status: DC
  Administered 2020-03-31 – 2020-04-18 (×18): 12.5 mg via ORAL
  Filled 2020-03-30 (×19): qty 1

## 2020-03-30 MED ORDER — SODIUM CHLORIDE 0.9 % IV SOLN
250.0000 mL | INTRAVENOUS | Status: DC | PRN
  Administered 2020-04-07: 250 mL via INTRAVENOUS

## 2020-03-30 MED ORDER — INSULIN ASPART 100 UNIT/ML ~~LOC~~ SOLN
0.0000 [IU] | SUBCUTANEOUS | Status: DC
  Administered 2020-03-30 – 2020-03-31 (×2): 2 [IU] via SUBCUTANEOUS
  Administered 2020-03-31: 1 [IU] via SUBCUTANEOUS
  Administered 2020-03-31: 2 [IU] via SUBCUTANEOUS
  Administered 2020-03-31: 1 [IU] via SUBCUTANEOUS
  Administered 2020-03-31: 2 [IU] via SUBCUTANEOUS
  Filled 2020-03-30 (×6): qty 1

## 2020-03-30 MED ORDER — SODIUM CHLORIDE 0.9% FLUSH: 3.0000 mL | INTRAVENOUS | Status: DC | PRN

## 2020-03-30 MED ORDER — FUROSEMIDE 10 MG/ML IJ SOLN
40.0000 mg | Freq: Two times a day (BID) | INTRAMUSCULAR | Status: DC
  Administered 2020-03-31 – 2020-04-02 (×5): 40 mg via INTRAVENOUS
  Filled 2020-03-30 (×5): qty 4

## 2020-03-30 MED ORDER — ALBUTEROL SULFATE (2.5 MG/3ML) 0.083% IN NEBU
2.5000 mg | INHALATION_SOLUTION | RESPIRATORY_TRACT | Status: DC | PRN
  Administered 2020-04-04 – 2020-04-12 (×3): 2.5 mg via RESPIRATORY_TRACT
  Filled 2020-03-30 (×3): qty 3

## 2020-03-30 MED ORDER — ATORVASTATIN CALCIUM 20 MG PO TABS
20.0000 mg | ORAL_TABLET | Freq: Every day | ORAL | Status: DC
  Administered 2020-03-31 – 2020-04-18 (×19): 20 mg via ORAL
  Filled 2020-03-30 (×20): qty 1

## 2020-03-30 MED ORDER — APIXABAN 5 MG PO TABS
5.0000 mg | ORAL_TABLET | Freq: Two times a day (BID) | ORAL | Status: DC
  Administered 2020-03-30 – 2020-04-18 (×38): 5 mg via ORAL
  Filled 2020-03-30 (×38): qty 1

## 2020-03-30 MED ORDER — ZINC SULFATE 220 (50 ZN) MG PO CAPS
220.0000 mg | ORAL_CAPSULE | Freq: Every day | ORAL | Status: DC
  Administered 2020-03-31 – 2020-04-18 (×19): 220 mg via ORAL
  Filled 2020-03-30 (×19): qty 1

## 2020-03-30 MED ORDER — OXYCODONE HCL 5 MG PO TABS
5.0000 mg | ORAL_TABLET | Freq: Four times a day (QID) | ORAL | Status: DC | PRN
  Administered 2020-03-30 – 2020-04-13 (×29): 5 mg via ORAL
  Filled 2020-03-30 (×30): qty 1

## 2020-03-30 MED ORDER — IPRATROPIUM-ALBUTEROL 0.5-2.5 (3) MG/3ML IN SOLN
3.0000 mL | RESPIRATORY_TRACT | Status: DC
  Administered 2020-03-30 – 2020-04-01 (×9): 3 mL via RESPIRATORY_TRACT
  Filled 2020-03-30 (×9): qty 3

## 2020-03-30 MED ORDER — GABAPENTIN 300 MG PO CAPS
600.0000 mg | ORAL_CAPSULE | Freq: Four times a day (QID) | ORAL | Status: DC
  Administered 2020-03-30 – 2020-04-03 (×15): 600 mg via ORAL
  Filled 2020-03-30 (×15): qty 2

## 2020-03-30 MED ORDER — ALBUTEROL SULFATE (2.5 MG/3ML) 0.083% IN NEBU
10.0000 mg | INHALATION_SOLUTION | Freq: Once | RESPIRATORY_TRACT | Status: AC
  Administered 2020-03-30: 10 mg via RESPIRATORY_TRACT
  Filled 2020-03-30: qty 12

## 2020-03-30 MED ORDER — HYDRALAZINE HCL 20 MG/ML IJ SOLN: 5.0000 mg | INTRAMUSCULAR | Status: DC | PRN

## 2020-03-30 MED ORDER — FERROUS SULFATE 325 (65 FE) MG PO TABS
325.0000 mg | ORAL_TABLET | Freq: Every day | ORAL | Status: DC
  Administered 2020-03-31 – 2020-04-18 (×19): 325 mg via ORAL
  Filled 2020-03-30 (×19): qty 1

## 2020-03-30 MED ORDER — POLYETHYLENE GLYCOL 3350 17 G PO PACK
17.0000 g | PACK | Freq: Every day | ORAL | Status: DC
  Administered 2020-03-31 – 2020-04-18 (×11): 17 g via ORAL
  Filled 2020-03-30 (×13): qty 1

## 2020-03-30 MED ORDER — ASPIRIN EC 81 MG PO TBEC
81.0000 mg | DELAYED_RELEASE_TABLET | Freq: Every day | ORAL | Status: DC
  Administered 2020-03-31 – 2020-04-18 (×19): 81 mg via ORAL
  Filled 2020-03-30 (×20): qty 1

## 2020-03-30 MED ORDER — DM-GUAIFENESIN ER 30-600 MG PO TB12
1.0000 | ORAL_TABLET | Freq: Two times a day (BID) | ORAL | Status: DC | PRN
  Administered 2020-04-02 – 2020-04-05 (×3): 1 via ORAL
  Filled 2020-03-30 (×3): qty 1

## 2020-03-30 MED ORDER — SODIUM CHLORIDE 0.9% FLUSH
3.0000 mL | Freq: Two times a day (BID) | INTRAVENOUS | Status: DC
  Administered 2020-03-30 – 2020-04-17 (×27): 3 mL via INTRAVENOUS

## 2020-03-30 MED ORDER — DOCUSATE SODIUM 100 MG PO CAPS
100.0000 mg | ORAL_CAPSULE | Freq: Two times a day (BID) | ORAL | Status: DC
  Administered 2020-03-30 – 2020-04-18 (×36): 100 mg via ORAL
  Filled 2020-03-30 (×37): qty 1

## 2020-03-30 MED ORDER — RENA-VITE PO TABS
1.0000 | ORAL_TABLET | Freq: Every day | ORAL | Status: DC
  Administered 2020-03-31 – 2020-04-18 (×17): 1 via ORAL
  Filled 2020-03-30 (×21): qty 1

## 2020-03-30 MED ORDER — INSULIN DETEMIR 100 UNIT/ML ~~LOC~~ SOLN
21.0000 [IU] | Freq: Every day | SUBCUTANEOUS | Status: DC
  Administered 2020-03-31: 21 [IU] via SUBCUTANEOUS
  Filled 2020-03-30 (×2): qty 0.21

## 2020-03-30 MED ORDER — VITAMIN B-12 1000 MCG PO TABS
1000.0000 ug | ORAL_TABLET | Freq: Every day | ORAL | Status: DC
  Administered 2020-03-31 – 2020-04-18 (×19): 1000 ug via ORAL
  Filled 2020-03-30 (×19): qty 1

## 2020-03-30 MED ORDER — SERTRALINE HCL 50 MG PO TABS
100.0000 mg | ORAL_TABLET | Freq: Every day | ORAL | Status: DC
  Administered 2020-03-31 – 2020-04-18 (×19): 100 mg via ORAL
  Filled 2020-03-30 (×19): qty 2

## 2020-03-30 NOTE — Plan of Care (Signed)

## 2020-03-30 NOTE — ED Notes (Signed)
This RN attempted to call report at this time.  

## 2020-03-30 NOTE — ED Notes (Signed)
Pt transferred to room 231.

## 2020-03-30 NOTE — ED Provider Notes (Signed)
[facility] Emergency Department Provider Note ____________________________________________   First MD Initiated Contact with Patient 03/30/20 1339     (approximate)  I have reviewed the triage vital signs and the nursing notes.  HISTORY  Chief Complaint Shortness of Breath   HPI Julie Bender is a 59 y.o. femalewho presents to the ED for evaluation of shortness of breath.  Chart review indicates history of obesity, DM on insulin, HTN, HLD, asthma and dCHF. Previously ESRD on iHD, last dialysis was 1 month ago she reports. History of HOCM s/p 09/2019 [facility] myectomy with a prolonged hospital course and multiple complications.  She has since been confined to a SNF and nonambulatory for the past 6 months.  Patient is chronically dependent on 3 L nasal cannula now.  Patient now resides at [facility].  EMS reports that patient was noted to be cyanotic at her facility this morning, and so of staff placed her on NRB facemask with improved saturations.  EMS found her on NRB with sats in the 80s/90s.  Patient reports that she does not recall this incident, indicating that she has felt short of breath for the past "couple days."  She reports associated orthopnea.  She reports feeling better now and has no complaints.  History limited due to altered mental status during patient event.   Past Medical History:  Diagnosis Date  . Acute on chronic respiratory failure with hypoxia ([place])   . Asthma   . CHF (congestive heart failure) ([place])   . Chronic heart failure with preserved ejection fraction ([place])   . Chronic obstructive asthma ([place])   . COPD (chronic obstructive pulmonary disease) ([place])   . Diabetes mellitus without complication ([place])   . Diabetic retinopathy ([place])   . End stage renal disease on dialysis ([place])   . History of placement of internal cardiac defibrillator   . Hypertension   . Hypertrophic cardiomyopathy ([place])   . Hypertrophic cardiomyopathy  ([place])   . Morbid obesity ([place])   . Obstructive sleep apnea     Patient Active Problem List   Diagnosis Date Noted  . Acute on chronic respiratory failure with hypoxia ([place])   . Obstructive sleep apnea   . Morbid obesity ([place])   . Chronic heart failure with preserved ejection fraction ([place])   . Hypertrophic cardiomyopathy ([place])   . End stage renal disease on dialysis ([place])   . Chronic obstructive asthma ([place])     Past Surgical History:  Procedure Laterality Date  . CARDIAC DEFIBRILLATOR PLACEMENT    . CHOLECYSTECTOMY    . RIGHT/LEFT HEART CATH AND CORONARY ANGIOGRAPHY N/A 06/19/2019   Procedure: RIGHT/LEFT HEART CATH AND CORONARY ANGIOGRAPHY;  Surgeon: Nelva Bush, MD;  Location: [facility];  Service: Cardiovascular;  Laterality: N/A;    Prior to Admission medications   Medication Sig Start Date End Date Taking? Authorizing Provider  acetaminophen (TYLENOL) 500 MG tablet Take 1,000 mg by mouth every 6 (six) hours as needed.    [provider]  albuterol (PROVENTIL HFA;VENTOLIN HFA) 108 (90 Base) MCG/ACT inhaler Inhale 2 puffs into the lungs every 6 (six) hours as needed for wheezing or shortness of breath. 04/20/18   Salary, Holly Bodily D, MD  aspirin EC 81 MG tablet Take 81 mg by mouth daily.    [provider]  atorvastatin (LIPITOR) 40 MG tablet Take 1 tablet (40 mg total) by mouth daily at 6 PM. Patient taking differently: Take 40 mg by mouth daily.  04/20/18  Salary, Avel Peace, MD  chlorpheniramine-HYDROcodone (TUSSIONEX) 10-8 MG/5ML SUER Take 5 mLs by mouth every 12 (twelve) hours as needed for cough. 06/08/19   Saundra Shelling, MD  citalopram (CELEXA) 20 MG tablet Take 20 mg by mouth daily. 05/03/16 07/01/28  [provider]  cyanocobalamin 1000 MCG tablet Take 1,000 mcg by mouth daily.    [provider]  insulin aspart (NOVOLOG FLEXPEN) 100 UNIT/ML FlexPen Inject 13 Units into the skin 3 (three) times daily with meals.     [provider]  insulin detemir (LEVEMIR) 100 UNIT/ML injection Inject 42 Units into the skin at bedtime.    [provider]  metFORMIN (GLUCOPHAGE) 850 MG tablet Take 850 mg by mouth 3 (three) times daily. 05/03/16 07/01/28  [provider]  metoprolol tartrate (LOPRESSOR) 25 MG tablet Take 1 tablet (25 mg total) by mouth 2 (two) times daily. 06/08/19 08/23/19  Saundra Shelling, MD  nitroGLYCERIN (NITROSTAT) 0.4 MG SL tablet Place 1 tablet (0.4 mg total) under the tongue every 5 (five) minutes as needed for chest pain. 05/20/19   Demetrios Loll, MD  oxyCODONE (OXY IR/ROXICODONE) 5 MG immediate release tablet Take 1 tablet (5 mg total) by mouth every 6 (six) hours as needed for moderate pain. 06/08/19   Saundra Shelling, MD  potassium chloride SA (KLOR-CON) 20 MEQ tablet Take 2 tablets (40 meq) by mouth x 1 dose today, then take 1 tablet (20 meq) by mouth once daily 06/27/19   End, Harrell Gave, MD  spironolactone (ALDACTONE) 25 MG tablet Take 1 tablet (25 mg total) by mouth daily. 07/02/19 07/01/20  Rise Mu, PA-C  torsemide (DEMADEX) 20 MG tablet Take 4 tablets (80 mg total) by mouth daily. 06/19/19 08/23/19  End, Harrell Gave, MD  traZODone (DESYREL) 150 MG tablet Take 150 mg by mouth at bedtime. 04/02/16   [provider]  umeclidinium-vilanterol (ANORO ELLIPTA) 62.5-25 MCG/INH AEPB Inhale 1 puff into the lungs daily.    [provider]    Allergies Patient has no known allergies.  Family History  Problem Relation Age of Onset  . Hypertrophic cardiomyopathy Sister     Social History Social History   Tobacco Use  . Smoking status: Passive Smoke Exposure - Never Smoker  . Smokeless tobacco: Never Used  Vaping Use  . Vaping Use: Never used  Substance Use Topics  . Alcohol use: No  . Drug use: Never    Review of Systems  Constitutional: No fever/chills Eyes: No visual changes. ENT: No sore throat. Cardiovascular: Denies chest pain. Respiratory: Positive  for shortness of breath Gastrointestinal: No abdominal pain.  No nausea, no vomiting.  No diarrhea.  No constipation. Genitourinary: Negative for dysuria. Musculoskeletal: Negative for back pain. Skin: Negative for rash. Neurological: Negative for headaches, focal weakness or numbness. ____________________________________________   PHYSICAL EXAM:  VITAL SIGNS: Vitals:   03/30/20 1409  BP: (!) 144/99  Pulse: 73  Resp: (!) 25  Temp: 97.7 F (36.5 C)  SpO2: 93%      Constitutional: Alert and oriented.  Obese.  Sitting up in stretcher on nonrebreather.  Conversational in full sentences. Eyes: Conjunctivae are normal. PERRL. EOMI. Head: Atraumatic. Nose: No congestion/rhinnorhea. Mouth/Throat: Mucous membranes are dry.  Oropharynx non-erythematous. Neck: No stridor. No cervical spine tenderness to palpation. Cardiovascular: Normal rate, regular rhythm. Grossly normal heart sounds.  Good peripheral circulation. Respiratory: Tachypneic to the mid/upper 20s, otherwise no evidence of distress or retractions.  Moderate air movement throughout with diffuse expiratory wheezes, no focal findings.  Gastrointestinal: Soft , nondistended, nontender to palpation. No abdominal bruits. No CVA tenderness. Musculoskeletal: No lower extremity tenderness.  No joint effusions. No signs of acute trauma. Bilateral transmetatarsal amputations noted, well-healed. Dry black in color gangrenous wounds to her fingertips Neurologic:  Normal speech and language. No gross focal neurologic deficits are appreciated. No gait instability noted. Skin:  Skin is warm, dry and intact. No rash noted. Psychiatric: Mood and affect are normal. Speech and behavior are normal.  ____________________________________________   LABS (all labs ordered are listed, but only abnormal results are displayed)  Labs Reviewed  CBC WITH DIFFERENTIAL/PLATELET - Abnormal; Notable for the following components:      Result Value   RBC  3.24 (*)    Hemoglobin 8.3 (*)    HCT 27.1 (*)    MCH 25.6 (*)    RDW 22.5 (*)    All other components within normal limits  COMPREHENSIVE METABOLIC PANEL - Abnormal; Notable for the following components:   Glucose, Bld 180 (*)    Creatinine, Ser 1.37 (*)    Albumin 3.4 (*)    AST 53 (*)    Alkaline Phosphatase 140 (*)    GFR calc non Af Amer 42 (*)    GFR calc Af Amer 48 (*)    All other components within normal limits  TROPONIN I (HIGH SENSITIVITY) - Abnormal; Notable for the following components:   Troponin I (High Sensitivity) 50 (*)    All other components within normal limits  SARS CORONAVIRUS 2 BY RT PCR (HOSPITAL ORDER, [facility])  MAGNESIUM  BRAIN NATRIURETIC PEPTIDE   ____________________________________________  12 Lead EKG  Junctional rhythm, rate of 75 bpm, normal axis.  Right bundle branch block is present.  No evidence of acute ischemia. ____________________________________________  RADIOLOGY  ED MD interpretation: CXR reviewed with evidence of pulmonary vascular congestion  Official radiology report(s): DG Chest Port 1 View  Result Date: 03/30/2020 CLINICAL DATA:  Shortness of breath EXAM: PORTABLE CHEST 1 VIEW COMPARISON:  06/01/2019 chest radiograph and prior. 06/04/2019 CT chest. FINDINGS: Patchy left greater than right bilateral mid to lower lung opacities. No pneumothorax. Trace left pleural effusion. Cardiomegaly, central pulmonary vascular congestion and diffuse interstitial prominence. Left chest wall pacing device with leads terminating over the right heart. Post sternotomy sequela. No acute osseous abnormality IMPRESSION: Bilateral mid to lower lung patchy opacities. Differential includes edema, infection and atelectasis. Cardiomegaly, central pulmonary vascular congestion and mild interstitial edema. Electronically Signed   By: Primitivo Gauze M.D.   On: 03/30/2020 15:00     ____________________________________________   PROCEDURES and INTERVENTIONS  Procedure(s) performed (including Critical Care):  Procedures  Medications  albuterol (PROVENTIL) (2.5 MG/3ML) 0.083% nebulizer solution 10 mg (10 mg Nebulization Given 03/30/20 1419)    ____________________________________________   INITIAL IMPRESSION / ASSESSMENT AND PLAN / ED COURSE  59 year old female with history of CHF in the setting of recent complicated HOCM myectomy course, presented to the ED short of breath after being found hypoxic and cyanotic at her facility this morning, most consistent with CHF exacerbation, and requiring hospitalist medical admission.  Increased O2 requirement from baseline, otherwise normal vital signs.  Exam obese patient who is tachypneic, with faint expiratory wheezes, but otherwise without distress or evidence of acute neurovascular deficits.  Blood work with anemia that is likely chronic, CKD and marginal elevation of troponin is likely elevated in the setting of demand ischemia and her hypoxia.  EKG is nonischemic.  Due to the severity  of her underlying illness and the severity of her cyanotic episode this morning, will admit the patient to hospitalist medicine for further work-up and management.  Patient provided albuterol nebulization in the ED with improved air movement throughout afterwards.  ____________________________________________   FINAL CLINICAL IMPRESSION(S) / ED DIAGNOSES  Final diagnoses:  SOB (shortness of breath)     ED Discharge Orders    None       Dolores Ewing   Note:  This document was prepared using Dragon voice recognition software and may include unintentional dictation errors.   Vladimir Crofts, MD 03/30/20 [phone]

## 2020-03-30 NOTE — ED Triage Notes (Signed)
Pt presents from [facility] via [facility] with c/o shortness of breath. Per facility pt is chronically on 3L. Pt became more short of breath starting this am. EMS reports patient 88% on chronic 3L. Pt placed on 10L via non-rebreather and saturation came up to 100%. This RN placed patient on normal 3L and patient is maintaining 93-95% on 3L. Pt is tachypneic and wheezing noted on expiration. Pt is currently alert and oriented x4 at this time.

## 2020-03-30 NOTE — H&P (Addendum)
History and Physical    Julie Bender BDZ:[id number] DOB: 22-Jan-1961 DOA: 03/30/2020  Referring MD/NP/PA:   PCP: Deanne Coffer, MD   Patient coming from:  The patient is coming from SNF.  At baseline, pt is dependent for most of ADL.        Chief Complaint: SOB  HPI: Julie Bender is a 59 y.o. female with medical history significant of hypertension, hyperlipidemia, diabetes mellitus, COPD on 3 L oxygen, asthma, depression, OSA, ICD placement, dCHF, HOCM s/p 09/2019 [facility] myectomy with a prolonged hospital course and multiple complications, CKD-III, A fib on Eliquis, who presents with shortness breath.  Patient has short of breath in the past 3 days, which has been progressively worsening.  She also has orthopnea.  She was noted to be cyanotic in SNF.  Oxygen desaturation to 88% on home level 3 L oxygen initially, currently improved to 93% on 3 L oxygen.  Patient had mild confusion earlier, which has resolved.  Currently patient is alert, oriented x3.  She answered all questions appropriately.  Patient denies chest pain, fever or chills.  She has dry cough.  No nausea, vomiting, diarrhea, abdominal pain, symptoms of UTI.  No unilateral numbness or tingling in extremities. No facial droop or slurred speech.  Patient has history of ESRD.  She used to be on dialysis.  Patient states that she her kidney function improved.  She does not need dialysis anymore.  Last dialysis was more than a month ago.  ED Course: pt was found to have BNP 1811, troponin 50, pending COVID-19 PCR, stable renal function, temperature normal, blood pressure 144/99, heart rate 73, RR 25, oxygen sat 93% on 3 L oxygen currently, chest x-ray showed cardiomegaly and vascular congestion.  Patient is admitted to progressive bed as inpatient.  Review of Systems:   General: no fevers, chills, no body weight gain, has fatigue HEENT: no blurry vision, hearing changes or sore throat Respiratory: has dyspnea, coughing, no wheezing CV:  no chest pain, no palpitations GI: no nausea, vomiting, abdominal pain, diarrhea, constipation GU: no dysuria, burning on urination, increased urinary frequency, hematuria  Ext: has leg edema Neuro: no unilateral weakness, numbness, or tingling, no vision change or hearing loss Skin: no rash, no skin tear. MSK: No muscle spasm, no deformity, no limitation of range of movement in spin Heme: No easy bruising.  Travel history: No recent long distant travel.  Allergy: No Known Allergies  Past Medical History:  Diagnosis Date  . Acute on chronic respiratory failure with hypoxia ([place])   . Asthma   . CHF (congestive heart failure) ([place])   . Chronic heart failure with preserved ejection fraction ([place])   . Chronic obstructive asthma ([place])   . COPD (chronic obstructive pulmonary disease) ([place])   . Diabetes mellitus without complication ([place])   . Diabetic retinopathy ([place])   . End stage renal disease on dialysis ([place])   . History of placement of internal cardiac defibrillator   . Hypertension   . Hypertrophic cardiomyopathy ([place])   . Hypertrophic cardiomyopathy ([place])   . Morbid obesity ([place])   . Obstructive sleep apnea     Past Surgical History:  Procedure Laterality Date  . CARDIAC DEFIBRILLATOR PLACEMENT    . CHOLECYSTECTOMY    . RIGHT/LEFT HEART CATH AND CORONARY ANGIOGRAPHY N/A 06/19/2019   Procedure: RIGHT/LEFT HEART CATH AND CORONARY ANGIOGRAPHY;  Surgeon: Nelva Bush, MD;  Location: [facility];  Service: Cardiovascular;  Laterality: N/A;    Social History:  reports  that she is a non-smoker but has been exposed to tobacco smoke. She has never used smokeless tobacco. She reports that she does not drink alcohol and does not use drugs.  Family History:  Family History  Problem Relation Age of Onset  . Hypertrophic cardiomyopathy Sister      Prior to Admission medications   Medication Sig Start Date End Date Taking? Authorizing Provider  acetaminophen (TYLENOL)  500 MG tablet Take 1,000 mg by mouth every 6 (six) hours as needed.    [provider]  albuterol (PROVENTIL HFA;VENTOLIN HFA) 108 (90 Base) MCG/ACT inhaler Inhale 2 puffs into the lungs every 6 (six) hours as needed for wheezing or shortness of breath. 04/20/18   Salary, Holly Bodily D, MD  aspirin EC 81 MG tablet Take 81 mg by mouth daily.    [provider]  atorvastatin (LIPITOR) 40 MG tablet Take 1 tablet (40 mg total) by mouth daily at 6 PM. Patient taking differently: Take 40 mg by mouth daily.  04/20/18   Salary, Avel Peace, MD  chlorpheniramine-HYDROcodone (TUSSIONEX) 10-8 MG/5ML SUER Take 5 mLs by mouth every 12 (twelve) hours as needed for cough. 06/08/19   Saundra Shelling, MD  citalopram (CELEXA) 20 MG tablet Take 20 mg by mouth daily. 05/03/16 07/01/28  [provider]  cyanocobalamin 1000 MCG tablet Take 1,000 mcg by mouth daily.    [provider]  insulin aspart (NOVOLOG FLEXPEN) 100 UNIT/ML FlexPen Inject 13 Units into the skin 3 (three) times daily with meals.     [provider]  insulin detemir (LEVEMIR) 100 UNIT/ML injection Inject 42 Units into the skin at bedtime.    [provider]  metFORMIN (GLUCOPHAGE) 850 MG tablet Take 850 mg by mouth 3 (three) times daily. 05/03/16 07/01/28  [provider]  metoprolol tartrate (LOPRESSOR) 25 MG tablet Take 1 tablet (25 mg total) by mouth 2 (two) times daily. 06/08/19 08/23/19  Saundra Shelling, MD  nitroGLYCERIN (NITROSTAT) 0.4 MG SL tablet Place 1 tablet (0.4 mg total) under the tongue every 5 (five) minutes as needed for chest pain. 05/20/19   Demetrios Loll, MD  oxyCODONE (OXY IR/ROXICODONE) 5 MG immediate release tablet Take 1 tablet (5 mg total) by mouth every 6 (six) hours as needed for moderate pain. 06/08/19   Saundra Shelling, MD  potassium chloride SA (KLOR-CON) 20 MEQ tablet Take 2 tablets (40 meq) by mouth x 1 dose today, then take 1 tablet (20 meq) by mouth once daily 06/27/19   End,  Harrell Gave, MD  spironolactone (ALDACTONE) 25 MG tablet Take 1 tablet (25 mg total) by mouth daily. 07/02/19 07/01/20  Rise Mu, PA-C  torsemide (DEMADEX) 20 MG tablet Take 4 tablets (80 mg total) by mouth daily. 06/19/19 08/23/19  End, Harrell Gave, MD  traZODone (DESYREL) 150 MG tablet Take 150 mg by mouth at bedtime. 04/02/16   [provider]  umeclidinium-vilanterol (ANORO ELLIPTA) 62.5-25 MCG/INH AEPB Inhale 1 puff into the lungs daily.    [provider]    Physical Exam: Vitals:   03/30/20 1631 03/30/20 1735 03/30/20 2123 03/31/20 0508  BP: 138/86 (!) 157/106 137/90 133/90  Pulse: 77 74 75 73  Resp: 20  20   Temp:  97.8 F (36.6 C) 98 F (36.7 C) 98.9 F (37.2 C)  TempSrc:  Oral Oral Oral  SpO2: 97% 98% 100% 98%  Weight:  100.6 kg  99.8 kg  Height:  5\' 4"  (1.626 m)     General: Not in acute  distress HEENT:       Eyes: PERRL, EOMI, no scleral icterus.       ENT: No discharge from the ears and nose, no pharynx injection, no tonsillar enlargement.        Neck: No JVD, no bruit, no mass felt.  Heme: No neck lymph node enlargement. Cardiac: S1/S2, RRR, No gallops or rubs. Respiratory: Has crackles bilaterally GI: Soft, nondistended, nontender, no rebound pain, no organomegaly, BS present. GU: No hematuria Ext: has 2+ pitting leg edema bilaterally. Musculoskeletal: No joint deformities, No joint redness or warmth, no limitation of ROM in spin. Skin: No rashes.  Neuro: Alert, oriented X3, cranial nerves II-XII grossly intact, moves all extremities normally.  Psych: Patient is not psychotic, no suicidal or hemocidal ideation.  Labs on Admission: I have personally reviewed following labs and imaging studies  CBC: Recent Labs  Lab 03/30/20 1339 03/31/20 0442  WBC 7.5 7.8  NEUTROABS 5.7  --   HGB 8.3* 8.1*  HCT 27.1* 25.1*  MCV 83.6 80.7  PLT 236 941   Basic Metabolic Panel: Recent Labs  Lab 03/30/20 1339 03/31/20 0442  NA 136 137  K 4.3 3.9    CL 100 101  CO2 26 27  GLUCOSE 180* 123*  BUN 23 22  CREATININE 1.37* 1.38*  CALCIUM 8.9 8.9  MG 1.8 1.8   GFR: Estimated Creatinine Clearance: 49.1 mL/min (A) (by C-G formula based on SCr of 1.38 mg/dL (H)). Liver Function Tests: Recent Labs  Lab 03/30/20 1339  AST 53*  ALT 22  ALKPHOS 140*  BILITOT 1.2  PROT 7.5  ALBUMIN 3.4*   No results for input(s): LIPASE, AMYLASE in the last 168 hours. No results for input(s): AMMONIA in the last 168 hours. Coagulation Profile: No results for input(s): INR, PROTIME in the last 168 hours. Cardiac Enzymes: No results for input(s): CKTOTAL, CKMB, CKMBINDEX, TROPONINI in the last 168 hours. BNP (last 3 results) No results for input(s): PROBNP in the last 8760 hours. HbA1C: Recent Labs    03/30/20 1858  HGBA1C 6.7*   CBG: Recent Labs  Lab 03/30/20 1741 03/30/20 2120 03/31/20 0001 03/31/20 0254  GLUCAP 153* 182* 168* 137*   Lipid Profile: Recent Labs    03/31/20 0442  CHOL 85  HDL 42  LDLCALC 31  TRIG 60  CHOLHDL 2.0   Thyroid Function Tests: No results for input(s): TSH, T4TOTAL, FREET4, T3FREE, THYROIDAB in the last 72 hours. Anemia Panel: No results for input(s): VITAMINB12, FOLATE, FERRITIN, TIBC, IRON, RETICCTPCT in the last 72 hours. Urine analysis:    Component Value Date/Time   COLORURINE AMBER (A) 02/23/2019 1226   APPEARANCEUR CLOUDY (A) 02/23/2019 1226   LABSPEC 1.033 (H) 02/23/2019 1226   PHURINE 5.0 02/23/2019 1226   GLUCOSEU NEGATIVE 02/23/2019 1226   HGBUR NEGATIVE 02/23/2019 1226   BILIRUBINUR SMALL (A) 02/23/2019 1226   KETONESUR 5 (A) 02/23/2019 1226   PROTEINUR 30 (A) 02/23/2019 1226   NITRITE NEGATIVE 02/23/2019 1226   LEUKOCYTESUR NEGATIVE 02/23/2019 1226   Sepsis Labs: @LABRCNTIP (procalcitonin:4,lacticidven:4) ) Recent Results (from the past 240 hour(s))  SARS Coronavirus 2 by RT PCR (hospital order, performed in [facility] hospital lab) Nasopharyngeal Nasopharyngeal Swab      Status: None   Collection Time: 03/30/20  1:51 PM   Specimen: Nasopharyngeal Swab  Result Value Ref Range Status   SARS Coronavirus 2 NEGATIVE NEGATIVE Final    Comment: (NOTE) SARS-CoV-2 target nucleic acids are NOT DETECTED.  The SARS-CoV-2 RNA is generally detectable  in upper and lower respiratory specimens during the acute phase of infection. The lowest concentration of SARS-CoV-2 viral copies this assay can detect is 250 copies / mL. A negative result does not preclude SARS-CoV-2 infection and should not be used as the sole basis for treatment or other patient management decisions.  A negative result may occur with improper specimen collection / handling, submission of specimen other than nasopharyngeal swab, presence of viral mutation(s) within the areas targeted by this assay, and inadequate number of viral copies (<250 copies / mL). A negative result must be combined with clinical observations, patient history, and epidemiological information.  Fact Sheet for Patients:   StrictlyIdeas.no  Fact Sheet for Healthcare Providers: BankingDealers.co.za  This test is not yet approved or  cleared by the [place] FDA and has been authorized for detection and/or diagnosis of SARS-CoV-2 by FDA under an Emergency Use Authorization (EUA).  This EUA will remain in effect (meaning this test can be used) for the duration of the COVID-19 declaration under Section 564(b)(1) of the Act, 21 U.S.C. section 360bbb-3(b)(1), unless the authorization is terminated or revoked sooner.  Performed at [facility], [address] [place], [place] [address]   MRSA PCR Screening     Status: None   Collection Time: 03/30/20 10:23 PM   Specimen: Nasal Mucosa; Nasopharyngeal  Result Value Ref Range Status   MRSA by PCR NEGATIVE NEGATIVE Final    Comment:        The GeneXpert MRSA Assay (FDA approved for NASAL specimens only), is one  component of a comprehensive MRSA colonization surveillance program. It is not intended to diagnose MRSA infection nor to guide or monitor treatment for MRSA infections. Performed at [facility], [address] [place], [place] [address]      Radiological Exams on Admission: DG Chest [facility] 1 View  Result Date: 03/30/2020 CLINICAL DATA:  Shortness of breath EXAM: PORTABLE CHEST 1 VIEW COMPARISON:  06/01/2019 chest radiograph and prior. 06/04/2019 CT chest. FINDINGS: Patchy left greater than right bilateral mid to lower lung opacities. No pneumothorax. Trace left pleural effusion. Cardiomegaly, central pulmonary vascular congestion and diffuse interstitial prominence. Left chest wall pacing device with leads terminating over the right heart. Post sternotomy sequela. No acute osseous abnormality IMPRESSION: Bilateral mid to lower lung patchy opacities. Differential includes edema, infection and atelectasis. Cardiomegaly, central pulmonary vascular congestion and mild interstitial edema. Electronically Signed   By: Primitivo Gauze M.D.   On: 03/30/2020 15:00     EKG: Independently reviewed.  QTC 496, low voltage, poor R wave progression, right bundle blockage, RAD, not sure if this is paced rhythm  Assessment/Plan Principal Problem:   Acute on chronic diastolic CHF (congestive heart failure) (HCC) Active Problems:   Depression   Elevated troponin   Acute on chronic respiratory failure with hypoxia (HCC)   COPD (chronic obstructive pulmonary disease) ([place])   Hypertension   Type II diabetes mellitus with renal manifestations (HCC)   CKD (chronic kidney disease), stage IIIa   Atrial fibrillation, chronic (HCC)   Acute on chronic respiratory failure with hypoxia due to acute on chronic diastolic CHF (congestive heart failure) ([place]): 2D echo on 05/21/2019 showed EF > 75% patient has a bilateral 2+ leg edema, crackles on auscultation, elevated BNP 1811, chest x-ray showed  cardiomegaly and vascular congestion, clinically consistent with CHF exacerbation.  -Will admit to progressive unit as inpatient -Lasix 40 mg bid by IV (patient received 60 mg of Lasix in ED) -2d echo -  Daily weights -strict I/O's -Low salt diet -Fluid restriction -Obtain REDs Vest reading  Elevated troponin: Troponin 50.  No chest pain.  Most likely due to demand ischemia secondary to CHF exacerbation -Trend troponin -Aspirin, Lipitor, metoprolol -As needed nitroglycerin -Check A1c, FLP -Repeat EKG in morning  Depression -Continue home medications  COPD (chronic obstructive pulmonary disease) ([place]): No wheezing or rhonchi on auscultation -Bronchodilators  Hypertension -IV hydralazine as needed -Patient is on IV Lasix -Metoprolol  HLD: -Lipitor  Type II diabetes mellitus with renal manifestations [facility]): Recent A1c 7.2, poorly controlled.  Patient is feeling Metformin, NovoLog Victoza, and Levemir -Sliding scale insulin -Decrease Levemir dose from 42 to 21 unit daily  CKD-IIIa: stable.  Recent baseline creatinine 2.0-2.4.  Her creatinine is 1.37, BUN 23 -Follow-up by BMP  A fib: -on Eliquis, amiodarone and metoprolol   DVT ppx: Eliquis Code Status: Full code Family Communication: not done, no family member is at bed side. Disposition Plan:  Anticipate discharge back to previous SNF environment Consults called:  none Admission status:  progressive unit as inpt     Status is: Inpatient  Remains inpatient appropriate because:Inpatient level of care appropriate due to severity of illness.  Patient has multiple comorbidities, now presents with a CHF exacerbation and acute respiratory failure with hypoxia.  Patient also has elevated troponin.  Her presentation is highly complicated.  Patient is at high risk of deterioration.  Patient will need to be treated in hospital for at least 2 days.   Dispo: The patient is from: SNF              Anticipated d/c is to: SNF               Anticipated d/c date is: 2 days              Patient currently is not medically stable to d/c.           Date of Service 03/31/2020    Ivor Costa [facility]   If 7PM-7AM, please contact night-coverage [email or web address] 03/31/2020, 7:03 AM

## 2020-03-31 ENCOUNTER — Inpatient Hospital Stay (HOSPITAL_COMMUNITY)
Admit: 2020-03-31 | Discharge: 2020-03-31 | Disposition: A | Discharge status: Home / Self Care | Payer: Medicare Other | Attending: Internal Medicine | Admitting: Internal Medicine

## 2020-03-31 DIAGNOSIS — I482 Chronic atrial fibrillation, unspecified: Secondary | ICD-10-CM | POA: Diagnosis present

## 2020-03-31 DIAGNOSIS — I5031 Acute diastolic (congestive) heart failure: Secondary | ICD-10-CM

## 2020-03-31 DIAGNOSIS — I5033 Acute on chronic diastolic (congestive) heart failure: Secondary | ICD-10-CM

## 2020-03-31 LAB — BASIC METABOLIC PANEL
Anion gap: 9 (ref 5–15)
BUN: 22 mg/dL (ref 8–23)
CO2: 27 mmol/L (ref 22–32)
Calcium: 8.9 mg/dL (ref 8.9–10.3)
Chloride: 101 mmol/L (ref 98–111)
Creatinine, Ser: 1.38 mg/dL — ABNORMAL HIGH (ref 0.44–1.00)
GFR calc Af Amer: 48 mL/min — ABNORMAL LOW (ref >60)
GFR calc non Af Amer: 41 mL/min — ABNORMAL LOW (ref >60)
Glucose, Bld: 123 mg/dL — ABNORMAL HIGH (ref 70–99)
Potassium: 3.9 mmol/L (ref 3.5–5.1)
Sodium: 137 mmol/L (ref 135–145)

## 2020-03-31 LAB — GLUCOSE, CAPILLARY
Glucose-Capillary: 105 mg/dL — ABNORMAL HIGH (ref 70–99)
Glucose-Capillary: 126 mg/dL — ABNORMAL HIGH (ref 70–99)
Glucose-Capillary: 137 mg/dL — ABNORMAL HIGH (ref 70–99)
Glucose-Capillary: 153 mg/dL — ABNORMAL HIGH (ref 70–99)
Glucose-Capillary: 168 mg/dL — ABNORMAL HIGH (ref 70–99)
Glucose-Capillary: 178 mg/dL — ABNORMAL HIGH (ref 70–99)

## 2020-03-31 LAB — CBC
HCT: 25.1 % — ABNORMAL LOW (ref 36.0–46.0)
Hemoglobin: 8.1 g/dL — ABNORMAL LOW (ref 12.0–15.0)
MCH: 26 pg (ref 26.0–34.0)
MCHC: 32.3 g/dL (ref 30.0–36.0)
MCV: 80.7 fL (ref 80.0–100.0)
Platelets: 229 10*3/uL (ref 150–400)
RBC: 3.11 MIL/uL — ABNORMAL LOW (ref 3.87–5.11)
RDW: 22.5 % — ABNORMAL HIGH (ref 11.5–15.5)
WBC: 7.8 10*3/uL (ref 4.0–10.5)
nRBC: 0 % (ref 0.0–0.2)

## 2020-03-31 LAB — ECHOCARDIOGRAM COMPLETE
AR max vel: 1.53 cm2
AV Area VTI: 1.46 cm2
AV Area mean vel: 1.22 cm2
AV Mean grad: 7 mmHg
AV Peak grad: 12.7 mmHg
Ao pk vel: 1.78 m/s
Area-P 1/2: 5.66 cm2
Height: 64 in
S' Lateral: 3.43 cm
Single Plane A4C EF: 57.2 %
Weight: 3521.6 oz

## 2020-03-31 LAB — HIV ANTIBODY (ROUTINE TESTING W REFLEX): HIV Screen 4th Generation wRfx: NONREACTIVE

## 2020-03-31 LAB — LIPID PANEL
Cholesterol: 85 mg/dL (ref 0–200)
HDL: 42 mg/dL (ref >40)
LDL Cholesterol: 31 mg/dL (ref 0–99)
Total CHOL/HDL Ratio: 2 RATIO
Triglycerides: 60 mg/dL (ref <150)
VLDL: 12 mg/dL (ref 0–40)

## 2020-03-31 LAB — MAGNESIUM: Magnesium: 1.8 mg/dL (ref 1.7–2.4)

## 2020-03-31 MED ORDER — MUPIROCIN CALCIUM 2 % EX CREA
TOPICAL_CREAM | Freq: Two times a day (BID) | CUTANEOUS | Status: DC
  Administered 2020-04-08 – 2020-04-09 (×2): 1 via TOPICAL
  Filled 2020-03-31 (×2): qty 15

## 2020-03-31 MED ORDER — MAGNESIUM SULFATE IN D5W 1-5 GM/100ML-% IV SOLN
1.0000 g | Freq: Once | INTRAVENOUS | Status: AC
  Administered 2020-03-31: 1 g via INTRAVENOUS
  Filled 2020-03-31: qty 100

## 2020-03-31 MED ORDER — PERFLUTREN LIPID MICROSPHERE
1.0000 mL | INTRAVENOUS | Status: AC | PRN
  Administered 2020-03-31: 2 mL via INTRAVENOUS
  Filled 2020-03-31: qty 10

## 2020-03-31 NOTE — Evaluation (Signed)
Physical Therapy Evaluation Patient Details Name: Venesha Petraitis MRN: [id number] DOB: September 07, 1960 Today's Date: 03/31/2020   History of Present Illness  59 y.o. female with medical history significant of hypertension, hyperlipidemia, diabetes mellitus, COPD on 3 L oxygen, asthma, depression, OSA, ICD placement, dCHF, HOCM s/p 09/2019 [facility]emyectomy with a prolonged hospital course and multiple complications, toe amputations bilaterally, CKD-III, A fib on Eliquis, whopresents with a CHF exacerbation and acute respiratory failure with hypoxia.  Patient is from SNF     Clinical Impression  PT evaluation complete. Patient needs Max A for bed mobility for LE and trunk support. Extra time required to complete tasks due to fatigue with activity. Patient needs close stand by assistance for sitting balance with occasional posterior lean. Sitting tolerance is less than 2 minutes and limited by shortness of breath and fatigue with activity. Patient participated with LE therapeutic exercises in bed. Patient reports she was getting PT at SNF, 5 days per week prior to this admission. Recommend to continue PT to maximize function with return to SNF at discharge.     Follow Up Recommendations SNF    Equipment Recommendations  None recommended by PT    Recommendations for Other Services       Precautions / Restrictions Precautions Precautions: Fall Restrictions Weight Bearing Restrictions: No      Mobility  Bed Mobility Overal bed mobility: Needs Assistance Bed Mobility: Supine to Sit;Sit to Supine     Supine to sit: Max assist Sit to supine: Max assist   General bed mobility comments: assistance for LE and trunk support. extra time required to complete tasks   Transfers                 General transfer comment: unable to progress transfers due to poor sitting tolerance   Ambulation/Gait                Stairs            Wheelchair Mobility    Modified Rankin (Stroke  Patients Only)       Balance Overall balance assessment: Needs assistance Sitting-balance support: Bilateral upper extremity supported Sitting balance-Leahy Scale: Fair Sitting balance - Comments: intermittent posterior lean                                      Pertinent Vitals/Pain Pain Location: vague complaint of sacral area pain     Home Living Family/patient expects to be discharged to:: Private residence Living Arrangements: Parent;Other relatives Available Help at Discharge: Family Type of Home: House           Additional Comments: patient most recently at [facility] with the goal eventually to discharge home with family support     Prior Function Level of Independence: Needs assistance   Gait / Transfers Assistance Needed: patient reports she does not ambulate at this time. assistance required by therapist at SNF with intermittent use of mechanical lift to get OOB to chair.   ADL's / Homemaking Assistance Needed: patient needs assistance at SNF   Comments: patient reports getting PT 5 days a week at SNF, and OT several days per week      Hand Dominance   Dominant Hand: Right    Extremity/Trunk Assessment   Upper Extremity Assessment Upper Extremity Assessment: Generalized weakness (gangrenous digits noted left hand more than right )    Lower Extremity Assessment Lower Extremity Assessment: Generalized  weakness       Communication   Communication: No difficulties  Cognition Arousal/Alertness: Awake/alert Behavior During Therapy: WFL for tasks assessed/performed Overall Cognitive Status: Within Functional Limits for tasks assessed                                        General Comments      Exercises General Exercises - Lower Extremity Heel Slides: AAROM;Strengthening;Both;10 reps;Supine Hip ABduction/ADduction: AAROM;Strengthening;Both;10 reps;Supine Straight Leg Raises: AAROM;Strengthening;Both;10  reps;Supine Other Exercises Other Exercises: verbal cues for exercise technique    Assessment/Plan    PT Assessment Patient needs continued PT services  PT Problem List Decreased strength;Decreased activity tolerance;Decreased mobility;Decreased balance;Decreased safety awareness;Decreased knowledge of precautions;Cardiopulmonary status limiting activity       PT Treatment Interventions DME instruction;Functional mobility training;Gait training;Therapeutic activities;Therapeutic exercise;Balance training;Neuromuscular re-education;Patient/family education    PT Goals (Current goals can be found in the Care Plan section)  Acute Rehab PT Goals Patient Stated Goal: to complete rehab and return home  PT Goal Formulation: With patient Time For Goal Achievement: 04/14/20 Potential to Achieve Goals: Fair    Frequency Min 2X/week   Barriers to discharge        Co-evaluation               AM-PAC PT "6 Clicks" Mobility  Outcome Measure Help needed turning from your back to your side while in a flat bed without using bedrails?: A Lot Help needed moving from lying on your back to sitting on the side of a flat bed without using bedrails?: A Lot Help needed moving to and from a bed to a chair (including a wheelchair)?: A Lot Help needed standing up from a chair using your arms (e.g., wheelchair or bedside chair)?: Total Help needed to walk in hospital room?: Total Help needed climbing 3-5 steps with a railing? : Total 6 Click Score: 9    End of Session Equipment Utilized During Treatment: Oxygen Activity Tolerance: Patient limited by fatigue Patient left: in bed;with call bell/phone within reach;with bed alarm set Nurse Communication:  (nurse and student nurse present at start of session ) PT Visit Diagnosis: Unsteadiness on feet (R26.81);Muscle weakness (generalized) (M62.81)    Time: 9390-3009 PT Time Calculation (min) (ACUTE ONLY): 25 min   Charges:   PT Evaluation $PT  Eval Moderate Complexity: 1 Mod PT Treatments $Therapeutic Exercise: 8-22 mins        Minna Merritts, PT, MPT  Percell Locus 03/31/2020, 12:46 PM

## 2020-03-31 NOTE — Progress Notes (Signed)
*  PRELIMINARY RESULTS* Echocardiogram 2D Echocardiogram has been performed.  Julie Bender 03/31/2020, 9:49 AM

## 2020-03-31 NOTE — NC FL2 (Signed)
[place] LEVEL OF CARE SCREENING TOOL     IDENTIFICATION  Patient Name: Julie Bender Birthdate: Dec 11, 1960 Sex: female Admission Date (Current Location): 03/30/2020  [place] and [place] Number:  Engineering geologist and Address:  [facility], [address] [place], [place] [address]      Provider Number: [id number]  Attending Physician Name and Address:  Kayleen Memos, DO  Relative Name and Phone Number:       Current Level of Care: Hospital Recommended Level of Care: [place] Prior Approval Number:    Date Approved/Denied:   PASRR Number:    Discharge Plan: SNF    Current Diagnoses: Patient Active Problem List   Diagnosis Date Noted  . Atrial fibrillation, chronic ([place]) 03/31/2020  . Acute on chronic diastolic CHF (congestive heart failure) ([place]) 03/30/2020  . CKD (chronic kidney disease), stage IIIa 03/30/2020  . COPD (chronic obstructive pulmonary disease) ([place])   . Hypertension   . Type II diabetes mellitus with renal manifestations ([place])   . ESRD (end stage renal disease) ([place])   . Acute on chronic respiratory failure with hypoxia ([place])   . Obstructive sleep apnea   . Morbid obesity ([place])   . Chronic heart failure with preserved ejection fraction ([place])   . Hypertrophic cardiomyopathy ([place])   . End stage renal disease on dialysis ([place])   . Chronic obstructive asthma ([place])   . Elevated troponin 05/10/2017  . Depression 01/30/2014    Orientation RESPIRATION BLADDER Height & Weight     Time, Situation, Place, Self  Normal External catheter, Incontinent (placed 8/15) Weight: 220 lb 1.6 oz (99.8 kg) Height:  5\' 4"  (162.6 cm)  BEHAVIORAL SYMPTOMS/MOOD NEUROLOGICAL BOWEL NUTRITION STATUS      Continent Diet  AMBULATORY STATUS COMMUNICATION OF NEEDS Skin   Limited Assist Verbally Other (Comment) (open wound right and left finger, open incision on left foot)                       Personal Care  Assistance Level of Assistance  Bathing, Feeding, Dressing Bathing Assistance: Limited assistance Feeding assistance: Independent Dressing Assistance: Limited assistance     Functional Limitations Info  Sight, Hearing, Speech Sight Info: Adequate Hearing Info: Adequate Speech Info: Adequate    SPECIAL CARE FACTORS FREQUENCY  PT (By licensed PT), OT (By licensed OT)     PT Frequency: 5x OT Frequency: 5x            Contractures Contractures Info: Not present    Additional Factors Info  Code Status, Allergies Code Status Info: Full Code Allergies Info: No known allergies           Current Medications (03/31/2020):  This is the current hospital active medication list Current Facility-Administered Medications  Medication Dose Route Frequency Provider Last Rate Last Admin  . 0.9 %  sodium chloride infusion  250 mL Intravenous PRN Ivor Costa, MD      . acetaminophen (TYLENOL) tablet 650 mg  650 mg Oral Q4H PRN Ivor Costa, MD      . albuterol (PROVENTIL) (2.5 MG/3ML) 0.083% nebulizer solution 2.5 mg  2.5 mg Nebulization Q4H PRN Ivor Costa, MD      . amiodarone (PACERONE) tablet 200 mg  200 mg Oral Daily Ivor Costa, MD   200 mg at 03/31/20 0951  . apixaban (ELIQUIS) tablet 5 mg  5 mg Oral BID Ivor Costa, MD   5 mg at 03/31/20 0944  .  aspirin EC tablet 81 mg  81 mg Oral Daily Ivor Costa, MD   81 mg at 03/31/20 5366  . atorvastatin (LIPITOR) tablet 20 mg  20 mg Oral Daily Ivor Costa, MD   20 mg at 03/31/20 0945  . dextromethorphan-guaiFENesin (MUCINEX DM) 30-600 MG per 12 hr tablet 1 tablet  1 tablet Oral BID PRN Ivor Costa, MD      . docusate sodium (COLACE) capsule 100 mg  100 mg Oral BID Ivor Costa, MD   100 mg at 03/31/20 0945  . ferrous sulfate tablet 325 mg  325 mg Oral Q breakfast Ivor Costa, MD   325 mg at 03/31/20 0846  . furosemide (LASIX) injection 40 mg  40 mg Intravenous Q12H Ivor Costa, MD   40 mg at 03/31/20 0255  . gabapentin (NEURONTIN) capsule 600 mg  600 mg Oral  QID Ivor Costa, MD   600 mg at 03/31/20 0949  . hydrALAZINE (APRESOLINE) injection 5 mg  5 mg Intravenous Q2H PRN Ivor Costa, MD      . insulin aspart (novoLOG) injection 0-9 Units  0-9 Units Subcutaneous Q4H Ivor Costa, MD   2 Units at 03/31/20 1253  . insulin detemir (LEVEMIR) injection 21 Units  21 Units Subcutaneous Daily Ivor Costa, MD   21 Units at 03/31/20 [phone]  . ipratropium-albuterol (DUONEB) 0.5-2.5 (3) MG/3ML nebulizer solution 3 mL  3 mL Nebulization Q4H Ivor Costa, MD   3 mL at 03/31/20 1149  . metoprolol succinate (TOPROL-XL) 24 hr tablet 12.5 mg  12.5 mg Oral Daily Ivor Costa, MD   12.5 mg at 03/31/20 4742  . multivitamin (RENA-VIT) tablet 1 tablet  1 tablet Oral Daily Ivor Costa, MD   1 tablet at 03/31/20 0954  . mupirocin cream (BACTROBAN) 2 %   Topical BID Kayleen Memos, DO   Given at 03/31/20 1121  . ondansetron (ZOFRAN) injection 4 mg  4 mg Intravenous Q6H PRN Ivor Costa, MD      . oxyCODONE (Oxy IR/ROXICODONE) immediate release tablet 5 mg  5 mg Oral Q6H PRN Ivor Costa, MD   5 mg at 03/31/20 0845  . pantoprazole (PROTONIX) EC tablet 40 mg  40 mg Oral Daily Ivor Costa, MD   40 mg at 03/31/20 0950  . polyethylene glycol (MIRALAX / GLYCOLAX) packet 17 g  17 g Oral Daily Ivor Costa, MD   17 g at 03/31/20 1003  . sertraline (ZOLOFT) tablet 100 mg  100 mg Oral Daily Ivor Costa, MD   100 mg at 03/31/20 0950  . sodium chloride flush (NS) 0.9 % injection 3 mL  3 mL Intravenous Q12H Ivor Costa, MD   3 mL at 03/31/20 0957  . sodium chloride flush (NS) 0.9 % injection 3 mL  3 mL Intravenous PRN Ivor Costa, MD      . vitamin B-12 (CYANOCOBALAMIN) tablet 1,000 mcg  1,000 mcg Oral Daily Ivor Costa, MD   1,000 mcg at 03/31/20 5956  . zinc sulfate capsule 220 mg  220 mg Oral Daily Ivor Costa, MD   220 mg at 03/31/20 0945     Discharge Medications: Please see discharge summary for a list of discharge medications.  Relevant Imaging Results:  Relevant Lab Results:   Additional  Information SSN: [id number]  Eileen Stanford, LCSW

## 2020-03-31 NOTE — Consult Note (Signed)
[facility] Nurse Consult Note: Reason for Consult: Consult requested for left foot.  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 .2X2X.2cm, red and moist, small amt tan drainage. Other half of the location is well-approximated healed scar tissue. Dressing procedure/placement/frequency: Topical treatment orders provided for bedside nurses to perform daily as follows to promote moist healing: Apply Bactroban to left foot wound Q day, then cover with foam dressing.  (Change foam dressing Q 3 days or PRN soiling.) Please re-consult if further assistance is needed.  Thank-you,  Julien Girt MSN, [place], [place], [place], [place]

## 2020-03-31 NOTE — TOC Initial Note (Signed)
Transition of Care [facility]) - Initial/Assessment Note    Patient Details  Name: Julie Bender MRN: [id number] Date of Birth: [birth year]  Transition of Care [facility]) CM/SW Contact:    Eileen Stanford, LCSW Phone Number: 03/31/2020, 1:42 PM  Clinical Narrative:  Pt is alert and oriented. Pt is agreeable to go to SNF. Pt was at [facility] prior to admission. Pt is agreeable to return at d/c. Facility notified.                 Expected Discharge Plan: Skilled Nursing Facility Barriers to Discharge: Continued Medical Work up   Patient Goals and CMS Choice Patient states their goals for this hospitalization and ongoing recovery are:: to get stronger   Choice offered to / list presented to : Patient  Expected Discharge Plan and Services Expected Discharge Plan: [place] In-house Referral: Clinical Social Work   Post Acute Care Choice: [place] Living arrangements for the past 2 months: [place]                                      Prior Living Arrangements/Services Living arrangements for the past 2 months: [place] Lives with:: Self Patient language and need for interpreter reviewed:: Yes Do you feel safe going back to the place where you live?: Yes      Need for Family Participation in Patient Care: Yes (Comment) Care giver support system in place?: Yes (comment)   Criminal Activity/Legal Involvement Pertinent to Current Situation/Hospitalization: No - Comment as needed  Activities of Daily Living Home Assistive Devices/Equipment: Other (Comment) ADL Screening (condition at time of admission) Patient's cognitive ability adequate to safely complete daily activities?: Yes Is the patient deaf or have difficulty hearing?: No Does the patient have difficulty seeing, even when wearing glasses/contacts?: No Does the patient have difficulty concentrating, remembering, or making decisions?: No Patient able to  express need for assistance with ADLs?: Yes Does the patient have difficulty dressing or bathing?: Yes Independently performs ADLs?: No Communication: Independent Is this a change from baseline?: Pre-admission baseline Dressing (OT): Needs assistance Is this a change from baseline?: Pre-admission baseline Grooming: Needs assistance Is this a change from baseline?: Pre-admission baseline Feeding: Independent Bathing: Needs assistance Is this a change from baseline?: Pre-admission baseline Toileting: Needs assistance Is this a change from baseline?: Pre-admission baseline In/Out Bed: Needs assistance Is this a change from baseline?: Pre-admission baseline Walks in Home: Needs assistance Is this a change from baseline?: Pre-admission baseline Does the patient have difficulty walking or climbing stairs?: No Weakness of Legs: Both Weakness of Arms/Hands: None  Permission Sought/Granted Permission sought to share information with : Family Supports    Share Information with NAME: Kennyth Lose  Permission granted to share info w AGENCY: [facility]  Permission granted to share info w Relationship: sister     Emotional Assessment Appearance:: Appears stated age Attitude/Demeanor/Rapport: Engaged Affect (typically observed): Accepting, Appropriate Orientation: : Oriented to Situation, Oriented to  Time, Oriented to Place, Oriented to Self Alcohol / Substance Use: Not Applicable Psych Involvement: No (comment)  Admission diagnosis:  SOB (shortness of breath) [R06.02] Acute on chronic diastolic CHF (congestive heart failure) ([place]) [I50.33] Patient Active Problem List   Diagnosis Date Noted  . Atrial fibrillation, chronic ([place]) 03/31/2020  . Acute on chronic diastolic CHF (congestive heart failure) ([place]) 03/30/2020  . CKD (chronic kidney disease), stage IIIa 03/30/2020  .  COPD (chronic obstructive pulmonary disease) ([place])   . Hypertension   . Type II diabetes mellitus with renal  manifestations ([place])   . ESRD (end stage renal disease) ([place])   . Acute on chronic respiratory failure with hypoxia ([place])   . Obstructive sleep apnea   . Morbid obesity ([place])   . Chronic heart failure with preserved ejection fraction ([place])   . Hypertrophic cardiomyopathy ([place])   . End stage renal disease on dialysis ([place])   . Chronic obstructive asthma ([place])   . Elevated troponin 05/10/2017  . Depression 01/30/2014   PCP:  Deanne Coffer, MD Pharmacy:   [facility], [place] [address] [place] [address] Phone: [phone] Fax: [place] [phone] - [place], [place] [address] Phone: [phone] Fax: [phone]     Social Determinants of Health (SDOH) Interventions    Readmission Risk Interventions Readmission Risk Prevention Plan 06/02/2019 04/20/2018  Transportation Screening Complete Complete  PCP or Specialist Appt within 5-7 Days - Complete  Home Care Screening - Complete  Medication Review (RN CM) - Complete  Palliative Care Screening Not Applicable -  Medication Review (RN Care Manager) Complete -  Some recent data might be hidden

## 2020-03-31 NOTE — Progress Notes (Signed)
PROGRESS NOTE  Julie Bender OYD:[id number] DOB: Feb 14, 1961 DOA: 03/30/2020 PCP: Deanne Coffer, MD  HPI/Recap of past 24 hours: HPI: Julie Bender is a 59 y.o. 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  ED Course: BNP 1811, troponin 50, COVID-19 PCR negative, RR 25, oxygen sat 93% on 3 L oxygen currently, chest x-ray showed cardiomegaly and vascular congestion.    03/31/20: Seen and examined.  Volume overload on exam.  Ongoing diuresing.  2D echo completed, results are pending.  Assessment/Plan: Principal Problem:   Acute on chronic diastolic CHF (congestive heart failure) (HCC) Active Problems:   Depression   Elevated troponin   Acute on chronic respiratory failure with hypoxia (HCC)   COPD (chronic obstructive pulmonary disease) (HCC)   Hypertension   Type II diabetes mellitus with renal manifestations (HCC)   CKD (chronic kidney disease), stage IIIa   Atrial fibrillation, chronic (HCC)   Acute on chronic respiratory failure with hypoxia  secondary to pulmonary edema in the setting of acute on chronic diastolic CHF (congestive heart failure) ([place]):  On 3 L oxygen nasal cannula at baseline continuously 2D echo on 05/21/2019 showed EF > 75% patient has a bilateral 2+ leg edema, crackles on auscultation, elevated BNP 1811, chest x-ray showed cardiomegaly and vascular congestion, suggestive of pulmonary edema Ongoing diuresing with IV Lasix 40 mg twice daily Maintain O2 saturation greater than 90%  Acute on chronic diastolic CHF Presented with volume overload, elevated BNP,  pulmonary edema on chest x-ray Continue current cardiac medications 2D echo completed, results are pending. Follow-up 2D echo results Continue strict I's and O's and daily weight Net I&O -110 cc  Elevated troponin suspect demand ischemia in the setting of acute on chronic hypoxia:  Troponin S peaked at 53 and trended down.   -Aspirin, Lipitor, metoprolol -As needed nitroglycerin Dual paced rhythm on EKG  QTC prolongation Twelve-lead EKG, QTC greater than 500 Avoid QTC prolonging agents  Depression Stable Continue home regimen  COPD (chronic obstructive pulmonary disease) ([place]):  Continue home regimen and oxygen supplementation to maintain O2 saturation greater than 90%  Hypertension Blood pressure is stable and at goal -IV hydralazine as needed -Patient is on IV Lasix -Metoprolol  HLD: -Lipitor  Type II diabetes mellitus with renal manifestations and hyperglycemia ([place]): Hemoglobin A1c 6.7 on 03/30/2020  -Sliding scale insulin and Levemir  CKD-IIIB:  stable.   Currently at her baseline creatinine 1.3 with GFR 48 Avoid nephrotoxins Continue to monitor  Paroxysmal A fib: Rate controlled on metoprolol On amiodarone for rhythm control On Eliquis for CVA prevention  Bilateral transmetatarsal amputation   DVT ppx: Eliquis Code Status: Full code Family Communication: None at bedside.  Consults called:  none   Status is: Inpatient    Dispo:  Patient From: Home  Planned Disposition: [place]  Expected discharge date: 04/02/20  Medically stable for discharge: No, due to ongoing management of acute on chronic diastolic CHF, ongoing diuresing.         Objective: Vitals:   03/31/20 0941 03/31/20 1149 03/31/20 1227 03/31/20 1413  BP: (!) 120/98  131/85   Pulse:   75 75  Resp:   19   Temp:  98.2 F (36.8 C)   TempSrc:   Oral   SpO2:  96% 100% 100%  Weight:      Height:        Intake/Output Summary (Last 24 hours) at  03/31/2020 1522 Last data filed at 03/31/2020 1000 Gross per 24 hour  Intake 1183 ml  Output 1300 ml  Net -117 ml   Filed Weights   03/30/20 1410 03/30/20 1735 03/31/20 0508  Weight: 93.9 kg 100.6 kg 99.8 kg    Exam:  . General: 59 y.o. year-old female well developed well nourished in no acute distress.  Alert and interactive. . Cardiovascular: Regular rate and rhythm with no rubs or gallops.  No thyromegaly or JVD noted.   Marland Kitchen Respiratory: Diffuse rales bilaterally no wheezing noted.  Poor inspiratory effort. . Abdomen: Soft nontender nondistended with normal bowel sounds x4 quadrants. . Musculoskeletal: 2+ pitting edema in lower extremities bilaterally.  Post transmetatarsal amputation bilaterally. Marland Kitchen Psychiatry: Mood is appropriate for condition and setting   Data Reviewed: CBC: Recent Labs  Lab 03/30/20 1339 03/31/20 0442  WBC 7.5 7.8  NEUTROABS 5.7  --   HGB 8.3* 8.1*  HCT 27.1* 25.1*  MCV 83.6 80.7  PLT 236 161   Basic Metabolic Panel: Recent Labs  Lab 03/30/20 1339 03/31/20 0442  NA 136 137  K 4.3 3.9  CL 100 101  CO2 26 27  GLUCOSE 180* 123*  BUN 23 22  CREATININE 1.37* 1.38*  CALCIUM 8.9 8.9  MG 1.8 1.8   GFR: Estimated Creatinine Clearance: 49.1 mL/min (A) (by C-G formula based on SCr of 1.38 mg/dL (H)). Liver Function Tests: Recent Labs  Lab 03/30/20 1339  AST 53*  ALT 22  ALKPHOS 140*  BILITOT 1.2  PROT 7.5  ALBUMIN 3.4*   No results for input(s): LIPASE, AMYLASE in the last 168 hours. No results for input(s): AMMONIA in the last 168 hours. Coagulation Profile: No results for input(s): INR, PROTIME in the last 168 hours. Cardiac Enzymes: No results for input(s): CKTOTAL, CKMB, CKMBINDEX, TROPONINI in the last 168 hours. BNP (last 3 results) No results for input(s): PROBNP in the last 8760 hours. HbA1C: Recent Labs    03/30/20 1858  HGBA1C 6.7*   CBG: Recent Labs  Lab 03/30/20 2120 03/31/20 0001 03/31/20 0254 03/31/20 0742  03/31/20 1229  GLUCAP 182* 168* 137* 105* 178*   Lipid Profile: Recent Labs    03/31/20 0442  CHOL 85  HDL 42  LDLCALC 31  TRIG 60  CHOLHDL 2.0   Thyroid Function Tests: No results for input(s): TSH, T4TOTAL, FREET4, T3FREE, THYROIDAB in the last 72 hours. Anemia Panel: No results for input(s): VITAMINB12, FOLATE, FERRITIN, TIBC, IRON, RETICCTPCT in the last 72 hours. Urine analysis:    Component Value Date/Time   COLORURINE AMBER (A) 02/23/2019 1226   APPEARANCEUR CLOUDY (A) 02/23/2019 1226   LABSPEC 1.033 (H) 02/23/2019 1226   PHURINE 5.0 02/23/2019 1226   GLUCOSEU NEGATIVE 02/23/2019 1226   HGBUR NEGATIVE 02/23/2019 1226   BILIRUBINUR SMALL (A) 02/23/2019 1226   KETONESUR 5 (A) 02/23/2019 1226   PROTEINUR 30 (A) 02/23/2019 1226   NITRITE NEGATIVE 02/23/2019 1226   LEUKOCYTESUR NEGATIVE 02/23/2019 1226   Sepsis Labs: @LABRCNTIP (procalcitonin:4,lacticidven:4)  ) Recent Results (from the past 240 hour(s))  SARS Coronavirus 2 by RT PCR (hospital order, performed in [place] hospital lab) Nasopharyngeal Nasopharyngeal Swab     Status: None   Collection Time: 03/30/20  1:51 PM   Specimen: Nasopharyngeal Swab  Result Value Ref Range Status   SARS Coronavirus 2 NEGATIVE NEGATIVE Final    Comment: (NOTE) SARS-CoV-2 target nucleic acids are NOT DETECTED.  The SARS-CoV-2 RNA is generally detectable in upper and lower respiratory specimens during the acute phase of infection. The lowest concentration of SARS-CoV-2 viral copies this assay can detect is 250 copies / mL. A negative result does not preclude SARS-CoV-2 infection and should not be used as the sole basis for treatment or other patient management decisions.  A negative result may occur with improper specimen collection / handling, submission of specimen other than nasopharyngeal swab, presence of viral mutation(s) within the areas targeted by this assay, and inadequate number of viral copies (<250 copies /  mL). A negative result must be combined with clinical observations, patient history, and epidemiological information.  Fact Sheet for Patients:   StrictlyIdeas.no  Fact Sheet for Healthcare Providers: BankingDealers.co.za  This test is not yet approved or  cleared by the [place] FDA and has been authorized for detection and/or diagnosis of SARS-CoV-2 by FDA under an Emergency Use Authorization (EUA).  This EUA will remain in effect (meaning this test can be used) for the duration of the COVID-19 declaration under Section 564(b)(1) of the Act, 21 U.S.C. section 360bbb-3(b)(1), unless the authorization is terminated or revoked sooner.  Performed at [facility], [address] [place], [place] [address]   MRSA PCR Screening     Status: None   Collection Time: 03/30/20 10:23 PM   Specimen: Nasal Mucosa; Nasopharyngeal  Result Value Ref Range Status   MRSA by PCR NEGATIVE NEGATIVE Final    Comment:        The GeneXpert MRSA Assay (FDA approved for NASAL specimens only), is one component of a comprehensive MRSA colonization surveillance program. It is not intended to diagnose MRSA infection nor to guide or monitor treatment for MRSA infections. Performed at [facility], [address] [place], [place] [address]       Studies: ECHOCARDIOGRAM COMPLETE  Result Date: 03/31/2020    ECHOCARDIOGRAM REPORT   Patient Name:   Julie Bender Date of Exam: 03/31/2020 Medical Rec #:  [id number]      Height:       64.0 in Accession #:    [id number]     Weight:       220.1 lb Date of Birth:  June 12, 1961       BSA:          2.038 m Patient Age:    62 years       BP:           122/83 mmHg Patient Gender: F              HR:           75 bpm. Exam Location:  [facility] Procedure: 2D Echo, Color Doppler, Cardiac Doppler and Intracardiac            Opacification Agent Indications:     I50.31 CHF-Acute Diastolic  History:          Patient has prior history of Echocardiogram examinations, most                  recent 05/21/2019. Hypertrophic Cardiomyopathy, Defibrillator,                  COPD and ESRD; Risk Factors:Sleep Apnea and Hypertension.  Sonographer:     Charmayne Sheer RDCS (AE) Referring Phys:  Baker Janus Soledad Gerlach NIU Diagnosing Phys:  Kathlyn Sacramento MD  Sonographer Comments: Technically difficult study due to poor echo windows. Image acquisition challenging due to patient body habitus and Image acquisition challenging due to COPD. IMPRESSIONS  1. Left ventricular ejection fraction, by estimation, is 55 to 60%. The left ventricle has normal function. Left ventricular endocardial border not optimally defined to evaluate regional wall motion. Left ventricular diastolic parameters are consistent with Grade II diastolic dysfunction (pseudonormalization).  2. Right ventricular systolic function is normal. The right ventricular size is normal. There is normal pulmonary artery systolic pressure.  3. The mitral valve is normal in structure. No evidence of mitral valve regurgitation. No evidence of mitral stenosis.  4. The aortic valve is normal in structure. Aortic valve regurgitation is not visualized. No aortic stenosis is present.  5. The inferior vena cava is normal in size with greater than 50% respiratory variability, suggesting right atrial pressure of 3 mmHg.  6. Very challenging image quality.  7. Left atrial size was mildly dilated. FINDINGS  Left Ventricle: Left ventricular ejection fraction, by estimation, is 55 to 60%. The left ventricle has normal function. Left ventricular endocardial border not optimally defined to evaluate regional wall motion. Definity contrast agent was given IV to delineate the left ventricular endocardial borders. The left ventricular internal cavity size was normal in size. There is no left ventricular hypertrophy. Left ventricular diastolic parameters are consistent with Grade II diastolic dysfunction  (pseudonormalization). Right Ventricle: The right ventricular size is normal. No increase in right ventricular wall thickness. Right ventricular systolic function is normal. There is normal pulmonary artery systolic pressure. The tricuspid regurgitant velocity is 2.63 m/s, and  with an assumed right atrial pressure of 5 mmHg, the estimated right ventricular systolic pressure is 48.2 mmHg. Left Atrium: Left atrial size was mildly dilated. Right Atrium: Right atrial size was normal in size. Pericardium: There is no evidence of pericardial effusion. Mitral Valve: The mitral valve is normal in structure. Normal mobility of the mitral valve leaflets. No evidence of mitral valve regurgitation. No evidence of mitral valve stenosis. MV peak gradient, 4.2 mmHg. The mean mitral valve gradient is 2.0 mmHg. Tricuspid Valve: The tricuspid valve is normal in structure. Tricuspid valve regurgitation is mild . No evidence of tricuspid stenosis. Aortic Valve: The aortic valve is normal in structure. Aortic valve regurgitation is not visualized. No aortic stenosis is present. Aortic valve mean gradient measures 7.0 mmHg. Aortic valve peak gradient measures 12.7 mmHg. Aortic valve area, by VTI measures 1.46 cm. Pulmonic Valve: The pulmonic valve was normal in structure. Pulmonic valve regurgitation is mild. No evidence of pulmonic stenosis. Aorta: The aortic root is normal in size and structure. Venous: The inferior vena cava is normal in size with greater than 50% respiratory variability, suggesting right atrial pressure of 3 mmHg. IAS/Shunts: No atrial level shunt detected by color flow Doppler.  LEFT VENTRICLE PLAX 2D LVIDd:         4.98 cm     Diastology LVIDs:         3.43 cm     LV e' lateral:   5.98 cm/s LV PW:         1.02 cm     LV E/e' lateral: 18.1 LV IVS:        0.95 cm     LV e' medial:    4.57 cm/s LVOT diam:     1.90 cm     LV E/e' medial:  23.6 LV SV:  39 LV SV Index:   19 LVOT Area:     2.84 cm  LV Volumes  (MOD) LV vol d, MOD A4C: 71.7 ml LV vol s, MOD A4C: 30.7 ml LV SV MOD A4C:     71.7 ml LEFT ATRIUM           Index LA Vol (A2C): 24.4 ml 11.97 ml/m LA Vol (A4C): 53.4 ml 26.21 ml/m  AORTIC VALVE                    PULMONIC VALVE AV Area (Vmax):    1.53 cm     PV Vmax:       0.88 m/s AV Area (Vmean):   1.22 cm     PV Vmean:      60.900 cm/s AV Area (VTI):     1.46 cm     PV VTI:        0.155 m AV Vmax:           178.00 cm/s  PV Peak grad:  3.1 mmHg AV Vmean:          126.000 cm/s PV Mean grad:  2.0 mmHg AV VTI:            0.264 m AV Peak Grad:      12.7 mmHg AV Mean Grad:      7.0 mmHg LVOT Vmax:         96.10 cm/s LVOT Vmean:        54.000 cm/s LVOT VTI:          0.136 m LVOT/AV VTI ratio: 0.52  AORTA Ao Root diam: 2.20 cm MITRAL VALVE                TRICUSPID VALVE MV Area (PHT): 5.66 cm     TR Peak grad:   27.7 mmHg MV Peak grad:  4.2 mmHg     TR Vmax:        263.00 cm/s MV Mean grad:  2.0 mmHg MV Vmax:       1.03 m/s     SHUNTS MV Vmean:      67.2 cm/s    Systemic VTI:  0.14 m MV Decel Time: 134 msec     Systemic Diam: 1.90 cm MV E velocity: 108.00 cm/s MV A velocity: 88.30 cm/s MV E/A ratio:  1.22 Kathlyn Sacramento MD Electronically signed by Kathlyn Sacramento MD Signature Date/Time: 03/31/2020/1:36:45 PM    Final     Scheduled Meds: . amiodarone  200 mg Oral Daily  . apixaban  5 mg Oral BID  . aspirin EC  81 mg Oral Daily  . atorvastatin  20 mg Oral Daily  . docusate sodium  100 mg Oral BID  . ferrous sulfate  325 mg Oral Q breakfast  . furosemide  40 mg Intravenous Q12H  . gabapentin  600 mg Oral QID  . insulin aspart  0-9 Units Subcutaneous Q4H  . insulin detemir  21 Units Subcutaneous Daily  . ipratropium-albuterol  3 mL Nebulization Q4H  . metoprolol succinate  12.5 mg Oral Daily  . multivitamin  1 tablet Oral Daily  . mupirocin cream   Topical BID  . pantoprazole  40 mg Oral Daily  . polyethylene glycol  17 g Oral Daily  . sertraline  100 mg Oral Daily  . sodium chloride flush  3 mL  Intravenous Q12H  . cyanocobalamin  1,000 mcg Oral Daily  . zinc sulfate  220 mg Oral Daily  Continuous Infusions: . sodium chloride       LOS: 1 day     Kayleen Memos, MD [facility] Pager [phone]  If 7PM-7AM, please contact night-coverage [email or web address] Password [email or web address] 03/31/2020, 3:22 PM

## 2020-04-01 LAB — GLUCOSE, CAPILLARY
Glucose-Capillary: 120 mg/dL — ABNORMAL HIGH (ref 70–99)
Glucose-Capillary: 128 mg/dL — ABNORMAL HIGH (ref 70–99)
Glucose-Capillary: 209 mg/dL — ABNORMAL HIGH (ref 70–99)
Glucose-Capillary: 210 mg/dL — ABNORMAL HIGH (ref 70–99)
Glucose-Capillary: 53 mg/dL — ABNORMAL LOW (ref 70–99)
Glucose-Capillary: 59 mg/dL — ABNORMAL LOW (ref 70–99)
Glucose-Capillary: 65 mg/dL — ABNORMAL LOW (ref 70–99)
Glucose-Capillary: 76 mg/dL (ref 70–99)
Glucose-Capillary: 84 mg/dL (ref 70–99)

## 2020-04-01 LAB — BASIC METABOLIC PANEL
Anion gap: 12 (ref 5–15)
BUN: 23 mg/dL (ref 8–23)
CO2: 28 mmol/L (ref 22–32)
Calcium: 8.9 mg/dL (ref 8.9–10.3)
Chloride: 97 mmol/L — ABNORMAL LOW (ref 98–111)
Creatinine, Ser: 1.36 mg/dL — ABNORMAL HIGH (ref 0.44–1.00)
GFR calc Af Amer: 49 mL/min — ABNORMAL LOW (ref >60)
GFR calc non Af Amer: 42 mL/min — ABNORMAL LOW (ref >60)
Glucose, Bld: 54 mg/dL — ABNORMAL LOW (ref 70–99)
Potassium: 3.9 mmol/L (ref 3.5–5.1)
Sodium: 137 mmol/L (ref 135–145)

## 2020-04-01 MED ORDER — METHYLPREDNISOLONE SODIUM SUCC 125 MG IJ SOLR
60.0000 mg | Freq: Two times a day (BID) | INTRAMUSCULAR | Status: DC
  Administered 2020-04-02 – 2020-04-03 (×3): 60 mg via INTRAVENOUS
  Filled 2020-04-01 (×3): qty 2

## 2020-04-01 MED ORDER — INSULIN DETEMIR 100 UNIT/ML ~~LOC~~ SOLN
15.0000 [IU] | Freq: Every day | SUBCUTANEOUS | Status: DC
  Filled 2020-04-01: qty 0.15

## 2020-04-01 MED ORDER — INSULIN ASPART 100 UNIT/ML ~~LOC~~ SOLN
0.0000 [IU] | Freq: Three times a day (TID) | SUBCUTANEOUS | Status: DC
  Administered 2020-04-01: 3 [IU] via SUBCUTANEOUS
  Administered 2020-04-01: 1 [IU] via SUBCUTANEOUS
  Administered 2020-04-02 (×2): 3 [IU] via SUBCUTANEOUS
  Administered 2020-04-02: 2 [IU] via SUBCUTANEOUS
  Administered 2020-04-03 – 2020-04-04 (×5): 3 [IU] via SUBCUTANEOUS
  Administered 2020-04-05: 7 [IU] via SUBCUTANEOUS
  Administered 2020-04-05: 3 [IU] via SUBCUTANEOUS
  Administered 2020-04-05: 7 [IU] via SUBCUTANEOUS
  Administered 2020-04-06: 2 [IU] via SUBCUTANEOUS
  Administered 2020-04-06: 5 [IU] via SUBCUTANEOUS
  Administered 2020-04-06: 3 [IU] via SUBCUTANEOUS
  Administered 2020-04-07: 5 [IU] via SUBCUTANEOUS
  Administered 2020-04-07: 9 [IU] via SUBCUTANEOUS
  Administered 2020-04-07: 1 [IU] via SUBCUTANEOUS
  Administered 2020-04-08: 5 [IU] via SUBCUTANEOUS
  Administered 2020-04-08: 3 [IU] via SUBCUTANEOUS
  Administered 2020-04-08 – 2020-04-09 (×2): 5 [IU] via SUBCUTANEOUS
  Administered 2020-04-10: 9 [IU] via SUBCUTANEOUS
  Administered 2020-04-10: 2 [IU] via SUBCUTANEOUS
  Administered 2020-04-10: 5 [IU] via SUBCUTANEOUS
  Administered 2020-04-11: 3 [IU] via SUBCUTANEOUS
  Administered 2020-04-11: 5 [IU] via SUBCUTANEOUS
  Administered 2020-04-11 – 2020-04-12 (×2): 3 [IU] via SUBCUTANEOUS
  Administered 2020-04-12: 2 [IU] via SUBCUTANEOUS
  Administered 2020-04-12: 1 [IU] via SUBCUTANEOUS
  Administered 2020-04-13: 2 [IU] via SUBCUTANEOUS
  Administered 2020-04-13: 7 [IU] via SUBCUTANEOUS
  Administered 2020-04-13: 3 [IU] via SUBCUTANEOUS
  Administered 2020-04-14: 2 [IU] via SUBCUTANEOUS
  Administered 2020-04-15: 1 [IU] via SUBCUTANEOUS
  Administered 2020-04-15: 3 [IU] via SUBCUTANEOUS
  Administered 2020-04-15: 1 [IU] via SUBCUTANEOUS
  Administered 2020-04-16 (×2): 2 [IU] via SUBCUTANEOUS
  Administered 2020-04-17 (×2): 5 [IU] via SUBCUTANEOUS
  Filled 2020-04-01 (×42): qty 1

## 2020-04-01 MED ORDER — INSULIN ASPART 100 UNIT/ML ~~LOC~~ SOLN
0.0000 [IU] | Freq: Every day | SUBCUTANEOUS | Status: DC
  Administered 2020-04-01: 2 [IU] via SUBCUTANEOUS
  Administered 2020-04-02 – 2020-04-03 (×2): 3 [IU] via SUBCUTANEOUS
  Administered 2020-04-04 – 2020-04-05 (×2): 4 [IU] via SUBCUTANEOUS
  Administered 2020-04-06: 3 [IU] via SUBCUTANEOUS
  Administered 2020-04-07: 5 [IU] via SUBCUTANEOUS
  Administered 2020-04-09: 2 [IU] via SUBCUTANEOUS
  Administered 2020-04-10: 4 [IU] via SUBCUTANEOUS
  Administered 2020-04-11 – 2020-04-17 (×6): 2 [IU] via SUBCUTANEOUS
  Filled 2020-04-01 (×14): qty 1

## 2020-04-01 MED ORDER — METHYLPREDNISOLONE SODIUM SUCC 125 MG IJ SOLR
60.0000 mg | Freq: Three times a day (TID) | INTRAMUSCULAR | Status: DC
  Administered 2020-04-01 (×2): 60 mg via INTRAVENOUS
  Filled 2020-04-01 (×2): qty 2

## 2020-04-01 MED ORDER — IPRATROPIUM-ALBUTEROL 0.5-2.5 (3) MG/3ML IN SOLN
3.0000 mL | Freq: Four times a day (QID) | RESPIRATORY_TRACT | Status: DC
  Administered 2020-04-01 – 2020-04-03 (×7): 3 mL via RESPIRATORY_TRACT
  Filled 2020-04-01 (×7): qty 3

## 2020-04-01 NOTE — Progress Notes (Signed)
Inpatient Diabetes Program Recommendations  AACE/ADA: New Consensus Statement on Inpatient Glycemic Control (2015)  Target Ranges:  Prepandial:   less than 140 mg/dL      Peak postprandial:   less than 180 mg/dL (1-2 hours)      Critically ill patients:  140 - 180 mg/dL   Lab Results  Component Value Date   GLUCAP 76 04/01/2020   HGBA1C 6.7 (H) 03/30/2020    Review of Glycemic Control Results for Julie Bender, Julie Bender (MRN [id number]) as of 04/01/2020 09:39  Ref. Range 03/31/2020 20:39 04/01/2020 00:12 04/01/2020 05:00 04/01/2020 05:52 04/01/2020 08:23 04/01/2020 08:59 04/01/2020 09:25  Glucose-Capillary Latest Ref Range: 70 - 99 mg/dL 153 (H) 120 (H) 53 (L) 84 59 (L) 65 (L) 76   Diabetes history: DM 2 Outpatient Diabetes medications:  Novolog 5 units tid with meals Levemir 42 units daily (as needed- Lantus is also listed) Victoza 0.6 mg daily Current orders for Inpatient glycemic control:  Novolog sensitive q 4 hours  Inpatient Diabetes Program Recommendations:    Please consider reducing Novolog correction to tid with meals.  Also noted that Levemir was held/dc'd due to low blood sugars.  Will follow.   Thanks  Adah Perl, RN, BC-ADM Inpatient Diabetes Coordinator Pager [phone] (8a-5p)

## 2020-04-01 NOTE — Progress Notes (Signed)
PROGRESS NOTE  Julie Bender TMH:[id number] DOB: 12/19/1960 DOA: 03/30/2020 PCP: Deanne Coffer, MD  HPI/Recap of past 24 hours: HPI: Julie Bender is a 59 y.o. female with medical history significant of hypertension, hyperlipidemia, diabetes mellitus, COPD and chronic hypoxia on 3 L oxygen at baseline continuously, asthma, recent intubation May 2021 at [facility] due to worsening asthma exacerbation, depression, OSA, ICD placement, dCHF, HOCM s/p 09/2019 Dukemyectomy with a prolonged hospital course and multiple complications,  bilateral transmetatarsal amputation, CKD-III,  paroxysmal A fib on Eliquis, who presents with shortness breath of 3 days duration, progressively worsening.  ED Course: BNP 1811, troponin 50, COVID-19 PCR negative, RR 25, oxygen sat 93% on 3 L oxygen currently, chest x-ray showed cardiomegaly and vascular congestion.    04/01/20: Seen and examined. Reports persistent dyspnea with minimal movement. Audible wheezing on exam. Started on IV Solu-Medrol.   Assessment/Plan: Principal Problem:   Acute on chronic diastolic CHF (congestive heart failure) (HCC) Active Problems:   Depression   Elevated troponin   Acute on chronic respiratory failure with hypoxia (HCC)   COPD (chronic obstructive pulmonary disease) (HCC)   Hypertension   Type II diabetes mellitus with renal manifestations (HCC)   CKD (chronic kidney disease), stage IIIa   Atrial fibrillation, chronic (HCC)   Acute on chronic respiratory failure with hypoxia  suspect multifactorial secondary to asthma exacerbation versus pulmonary edema in the setting of acute on chronic diastolic CHF (congestive heart failure) ([place]):  On 3 L oxygen nasal cannula at baseline continuously 2D echo on 05/21/2019 showed EF > 75% patient has a bilateral 2+ leg edema, crackles on auscultation, elevated BNP 1811, chest x-ray showed cardiomegaly and vascular congestion, suggestive of pulmonary edema Ongoing diuresing with IV Lasix 40 mg  twice daily Maintain O2 saturation greater than 90%  Asthma exacerbation likely contributed by volume overload with pulmonary edema Start IV Solu-Medrol 60 mg twice daily Continue bronchodilators Continue Lasix Continue oxygen supplementation to maintain O2 saturation greater than 90%  Acute on chronic diastolic CHF Presented with volume overload, elevated BNP, pulmonary edema on chest x-ray Continue current cardiac medications 2D echo completed, normal LVEF Continue strict I's and O's and daily weight Net I&O -1.1 L  Elevated troponin suspect demand ischemia in the setting of acute on chronic hypoxia:  Troponin S peaked at 53 and trended down.   -Aspirin, Lipitor, metoprolol -As needed nitroglycerin Dual paced rhythm on EKG  QTC prolongation Twelve-lead EKG, QTC greater than 500 Avoid QTC prolonging agents  Depression Stable Continue home regimen  COPD (chronic obstructive pulmonary disease) ([place]):  Continue home regimen and oxygen supplementation to maintain O2 saturation greater than 90%  Hypertension Blood pressure is stable and at goal -IV hydralazine as needed -Patient is on IV Lasix -Metoprolol  HLD: -Lipitor  Type II diabetes mellitus with renal manifestations and hyperglycemia ([place]): Hemoglobin A1c 6.7 on 03/30/2020  -Sliding scale insulin and Levemir  CKD-IIIB:  stable.   Currently at her baseline creatinine 1.3 with GFR 48 Avoid nephrotoxins Continue to monitor  Paroxysmal A fib: Rate controlled on metoprolol On amiodarone for rhythm control On Eliquis for CVA prevention  Peripheral vascular disease, severe, status post bilateral transmetatarsal amputation Severe chronic PVD apparent on her fingers bilaterally States she was seen at [facility] and was told that they will fall off on their own No acute issues at this time.  Monitor closely. Continue aspirin and Lipitor  Left transmetatarsal pressure lesion, POA Continue local wound  care   DVT ppx: Eliquis  Code Status: Full code Family Communication: None at bedside.  Consults called:  none   Status is: Inpatient    Dispo:  Patient From: Home  Planned Disposition: [place]  Expected discharge date: 04/02/20  Medically stable for discharge: No, due to ongoing management of acute on chronic diastolic CHF, ongoing diuresing.         Objective: Vitals:   04/01/20 0848 04/01/20 1151 04/01/20 1449 04/01/20 1533  BP:  (!) 97/59  107/63  Pulse: 76 73 72 75  Resp: 18 19 20 18   Temp:  98 F (36.7 C)  98 F (36.7 C)  TempSrc:  Oral  Oral  SpO2: 97% 95% 94% 98%  Weight:      Height:        Intake/Output Summary (Last 24 hours) at 04/01/2020 1615 Last data filed at 04/01/2020 1130 Gross per 24 hour  Intake 240 ml  Output 1300 ml  Net -1060 ml   Filed Weights   03/30/20 1410 03/30/20 1735 03/31/20 0508  Weight: 93.9 kg 100.6 kg 99.8 kg    Exam:  . General: 59 y.o. year-old female well-developed well-nourished no acute distress. Alert oriented x3. . Cardiovascular: Regular rate and rhythm no rubs or gallops..   . Respiratory: Diffuse wheezing bilaterally.  . Abdomen: Soft nontender normal bowel sounds present. . Musculoskeletal: 1+ pitting edema in lower extremities bilaterally. Transmetatarsal amputation bilaterally.  Marland Kitchen Psychiatry: Mood Is appropriate for condition and setting.   Data Reviewed: CBC: Recent Labs  Lab 03/30/20 1339 03/31/20 0442  WBC 7.5 7.8  NEUTROABS 5.7  --   HGB 8.3* 8.1*  HCT 27.1* 25.1*  MCV 83.6 80.7  PLT 236 130   Basic Metabolic Panel: Recent Labs  Lab 03/30/20 1339 03/31/20 0442 04/01/20 0414  NA 136 137 137  K 4.3 3.9 3.9  CL 100 101 97*  CO2 26 27 28   GLUCOSE 180* 123* 54*  BUN 23 22 23   CREATININE 1.37* 1.38* 1.36*  CALCIUM 8.9 8.9 8.9  MG 1.8 1.8  --    GFR: Estimated Creatinine Clearance: 49.9 mL/min (A) (by C-G formula based on SCr of 1.36 mg/dL (H)). Liver Function  Tests: Recent Labs  Lab 03/30/20 1339  AST 53*  ALT 22  ALKPHOS 140*  BILITOT 1.2  PROT 7.5  ALBUMIN 3.4*   No results for input(s): LIPASE, AMYLASE in the last 168 hours. No results for input(s): AMMONIA in the last 168 hours. Coagulation Profile: No results for input(s): INR, PROTIME in the last 168 hours. Cardiac Enzymes: No results for input(s): CKTOTAL, CKMB, CKMBINDEX, TROPONINI in the last 168 hours. BNP (last 3 results) No results for input(s): PROBNP in the last 8760 hours. HbA1C: Recent Labs    03/30/20 1858  HGBA1C 6.7*   CBG: Recent Labs  Lab 04/01/20 0552 04/01/20 0823 04/01/20 0859 04/01/20 0925 04/01/20 1150  GLUCAP 84 59* 65* 76 128*   Lipid Profile: Recent Labs    03/31/20 0442  CHOL 85  HDL 42  LDLCALC 31  TRIG 60  CHOLHDL 2.0   Thyroid Function Tests: No results for input(s): TSH, T4TOTAL, FREET4, T3FREE, THYROIDAB in the last 72 hours. Anemia Panel: No results for input(s): VITAMINB12, FOLATE, FERRITIN, TIBC, IRON, RETICCTPCT in the last 72 hours. Urine analysis:    Component Value Date/Time   COLORURINE AMBER (A) 02/23/2019 1226   APPEARANCEUR CLOUDY (A) 02/23/2019 1226   LABSPEC 1.033 (H) 02/23/2019 1226   PHURINE 5.0 02/23/2019 1226   GLUCOSEU NEGATIVE  02/23/2019 1226   HGBUR NEGATIVE 02/23/2019 1226   BILIRUBINUR SMALL (A) 02/23/2019 1226   KETONESUR 5 (A) 02/23/2019 1226   PROTEINUR 30 (A) 02/23/2019 1226   NITRITE NEGATIVE 02/23/2019 1226   LEUKOCYTESUR NEGATIVE 02/23/2019 1226   Sepsis Labs: @LABRCNTIP (procalcitonin:4,lacticidven:4)  ) Recent Results (from the past 240 hour(s))  SARS Coronavirus 2 by RT PCR (hospital order, performed in [facility] hospital lab) Nasopharyngeal Nasopharyngeal Swab     Status: None   Collection Time: 03/30/20  1:51 PM   Specimen: Nasopharyngeal Swab  Result Value Ref Range Status   SARS Coronavirus 2 NEGATIVE NEGATIVE Final    Comment: (NOTE) SARS-CoV-2 target nucleic acids are NOT  DETECTED.  The SARS-CoV-2 RNA is generally detectable in upper and lower respiratory specimens during the acute phase of infection. The lowest concentration of SARS-CoV-2 viral copies this assay can detect is 250 copies / mL. A negative result does not preclude SARS-CoV-2 infection and should not be used as the sole basis for treatment or other patient management decisions.  A negative result may occur with improper specimen collection / handling, submission of specimen other than nasopharyngeal swab, presence of viral mutation(s) within the areas targeted by this assay, and inadequate number of viral copies (<250 copies / mL). A negative result must be combined with clinical observations, patient history, and epidemiological information.  Fact Sheet for Patients:   StrictlyIdeas.no  Fact Sheet for Healthcare Providers: BankingDealers.co.za  This test is not yet approved or  cleared by the [place] FDA and has been authorized for detection and/or diagnosis of SARS-CoV-2 by FDA under an Emergency Use Authorization (EUA).  This EUA will remain in effect (meaning this test can be used) for the duration of the COVID-19 declaration under Section 564(b)(1) of the Act, 21 U.S.C. section 360bbb-3(b)(1), unless the authorization is terminated or revoked sooner.  Performed at [facility], [address] [place], [place] [address]   MRSA PCR Screening     Status: None   Collection Time: 03/30/20 10:23 PM   Specimen: Nasal Mucosa; Nasopharyngeal  Result Value Ref Range Status   MRSA by PCR NEGATIVE NEGATIVE Final    Comment:        The GeneXpert MRSA Assay (FDA approved for NASAL specimens only), is one component of a comprehensive MRSA colonization surveillance program. It is not intended to diagnose MRSA infection nor to guide or monitor treatment for MRSA infections. Performed at [facility], [address], [place],  [address]       Studies: No results found.  Scheduled Meds: . amiodarone  200 mg Oral Daily  . apixaban  5 mg Oral BID  . aspirin EC  81 mg Oral Daily  . atorvastatin  20 mg Oral Daily  . docusate sodium  100 mg Oral BID  . ferrous sulfate  325 mg Oral Q breakfast  . furosemide  40 mg Intravenous Q12H  . gabapentin  600 mg Oral QID  . insulin aspart  0-5 Units Subcutaneous QHS  . insulin aspart  0-9 Units Subcutaneous TID WC  . ipratropium-albuterol  3 mL Nebulization Q6H  . methylPREDNISolone (SOLU-MEDROL) injection  60 mg Intravenous TID  . metoprolol succinate  12.5 mg Oral Daily  . multivitamin  1 tablet Oral Daily  . mupirocin cream   Topical BID  . pantoprazole  40 mg Oral Daily  . polyethylene glycol  17 g Oral Daily  . sertraline  100 mg Oral Daily  . sodium  chloride flush  3 mL Intravenous Q12H  . cyanocobalamin  1,000 mcg Oral Daily  . zinc sulfate  220 mg Oral Daily    Continuous Infusions: . sodium chloride       LOS: 2 days     Kayleen Memos, MD [facility] Pager [phone]  If 7PM-7AM, please contact night-coverage [email or web address] Password [email or web address] 04/01/2020, 4:15 PM

## 2020-04-01 NOTE — Progress Notes (Signed)
Hypoglycemic Event  CBG: 65  Treatment: 4 oz juice and breakfast   Symptoms: none  Follow-up CBG: [id number] CBG Result:76  Possible Reasons for Event:  Had not eaten any of her breakfast  Comments/MD notified:notified    Feliberto Gottron

## 2020-04-01 NOTE — Progress Notes (Addendum)
Hypoglycemic Event  CBG: 59  Treatment: 4 oz juice  Symptoms: none  Follow-up CBG: Time:0900 CBG Result:65  Possible Reasons for Event: Patient had not eaten breakfast  Comments/MD notified: notified    Feliberto Gottron

## 2020-04-01 NOTE — Progress Notes (Signed)
[place] attempted visit and notarization of AD as follow-up from yesterday's visit; when [facility] arrived w/necessary parties, pt. called out for help --> experiencing shortness of breath after removing her mask following breathing treatment. RT assisted in getting pt.'s O2 reestablished.  Pt. says she cannot sign paperwork at this time --> CH will attempt follow-up tomorrow.

## 2020-04-01 NOTE — Progress Notes (Signed)
Ch visited wit Pt after call from RN requesting AD completion. Upon arrival, Pt showed completed AD and asked for notarization. Ch explained to Pt that chaplains will attempt AD completion tomorrow because it was late in the day to find witnesses. Pt understands and obliged the next day completion.   Pt let Ch know that she DOES NOT WANT A TRAECH OR VENTILATOR. But she DOES WANT CPR.  Pt shared with Ch about family situation; "We are not rich people, I do not want my family to struggle paying for me to keep me in the hospital. Pt asked Ch is it is suicide to sign for DNR. Ch provided pastoral presence and support with tough questions and situations she is carrying.   Ch will pass on AD completion request to chaplain coming in in the morning.

## 2020-04-01 NOTE — Progress Notes (Addendum)
[place] visited received page from 2A RN to discuss AD w/pt.; when [facility] arrived, pt. lying in bed w/sisters [name] and [place] at bedside.  Kennyth Lose produced MPOA paperwork she had printed at home and asked for help indicating that sister [name] would make all medical decisions necessary except for decisions re: withholding or discontinuing life support.  CH recommended pt. and sisters use [facility] AD for simplicity, designate [place] as primary MPOA and Kennyth Lose as alt. MPOA and indicate in the special instructions section that [name] defer to Kennyth Lose in matters related to life support; [place] also encouraged pt. to complete Living Will, as family and pt. seemed concerned re: life support questions.  CH assisted pt. and sisters in completing form; answered questions, etc.  CH will follow up tomorrow w/notary and witnesses to finalize document.

## 2020-04-02 LAB — BASIC METABOLIC PANEL
Anion gap: 14 (ref 5–15)
BUN: 31 mg/dL — ABNORMAL HIGH (ref 8–23)
CO2: 24 mmol/L (ref 22–32)
Calcium: 9.5 mg/dL (ref 8.9–10.3)
Chloride: 94 mmol/L — ABNORMAL LOW (ref 98–111)
Creatinine, Ser: 1.67 mg/dL — ABNORMAL HIGH (ref 0.44–1.00)
GFR calc Af Amer: 38 mL/min — ABNORMAL LOW (ref >60)
GFR calc non Af Amer: 33 mL/min — ABNORMAL LOW (ref >60)
Glucose, Bld: 195 mg/dL — ABNORMAL HIGH (ref 70–99)
Potassium: 5.1 mmol/L (ref 3.5–5.1)
Sodium: 132 mmol/L — ABNORMAL LOW (ref 135–145)

## 2020-04-02 LAB — GLUCOSE, CAPILLARY
Glucose-Capillary: 191 mg/dL — ABNORMAL HIGH (ref 70–99)
Glucose-Capillary: 208 mg/dL — ABNORMAL HIGH (ref 70–99)
Glucose-Capillary: 239 mg/dL — ABNORMAL HIGH (ref 70–99)
Glucose-Capillary: 279 mg/dL — ABNORMAL HIGH (ref 70–99)

## 2020-04-02 LAB — MAGNESIUM: Magnesium: 2.2 mg/dL (ref 1.7–2.4)

## 2020-04-02 LAB — PHOSPHORUS: Phosphorus: 4.6 mg/dL (ref 2.5–4.6)

## 2020-04-02 MED ORDER — NEPRO/CARBSTEADY PO LIQD
237.0000 mL | Freq: Two times a day (BID) | ORAL | Status: DC
  Administered 2020-04-04 – 2020-04-18 (×19): 237 mL via ORAL

## 2020-04-02 MED ORDER — INSULIN DETEMIR 100 UNIT/ML ~~LOC~~ SOLN
10.0000 [IU] | Freq: Every day | SUBCUTANEOUS | Status: DC
  Administered 2020-04-02: 10 [IU] via SUBCUTANEOUS
  Filled 2020-04-02 (×2): qty 0.1

## 2020-04-02 MED ORDER — ADULT MULTIVITAMIN W/MINERALS CH
1.0000 | ORAL_TABLET | Freq: Every day | ORAL | Status: DC
  Administered 2020-04-03 – 2020-04-18 (×16): 1 via ORAL
  Filled 2020-04-02 (×17): qty 1

## 2020-04-02 MED ORDER — FUROSEMIDE 10 MG/ML IJ SOLN: 40.0000 mg | Freq: Every day | INTRAMUSCULAR | Status: DC

## 2020-04-02 NOTE — Progress Notes (Signed)
PROGRESS NOTE  Julie Bender KPT:[id number] DOB: [birth year] DOA: 03/30/2020 PCP: Deanne Coffer, MD  HPI/Recap of past 24 hours: HPI: Julie Bender is a 59 y.o. 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  ED Course: BNP 1811, troponin 50, COVID-19 PCR negative, RR 25, oxygen sat 93% on 3 L oxygen currently, chest x-ray showed cardiomegaly and vascular congestion.    03/31/20: Seen and examined.  Volume overload on exam.  Ongoing diuresing.  2D echo completed, results are pending.   Subjective: The patient was seen and examined this morning, remained stable. Still complaining shortness of breath, on 4 L of oxygen satting 100%-patient stating she was not oxygen dependent at home. Denies any chest pain --- gangrene of the fingers left hand-no changes  Assessment/Plan: Principal Problem:   Acute on chronic diastolic CHF (congestive heart failure) (HCC) Active Problems:   Depression   Elevated troponin   Acute on chronic respiratory failure with hypoxia (HCC)   COPD (chronic obstructive pulmonary disease) (HCC)   Hypertension   Type II diabetes mellitus with renal manifestations (HCC)   CKD (chronic kidney disease), stage IIIa   Atrial fibrillation, chronic (HCC)   Acute on chronic respiratory failure with hypoxia  secondary to pulmonary edema in the setting of acute on chronic diastolic CHF (congestive heart failure) ([place]):  On 3 L oxygen nasal cannula at baseline continuously (patient stated she is not O2 dependent at home prior to her heart surgery) Currently on 4 L of oxygen,  satting 100% 2D echo on 05/21/2019 showed EF > 75% patient has a bilateral 2+ leg edema, crackles on auscultation, elevated BNP 1811, chest x-ray showed cardiomegaly and vascular congestion, suggestive of pulmonary edema Ongoing diuresing with IV Lasix 40 mg twice daily >>> will reduce it down to daily as creatinine has increased today  We will taper O2 demand down to maintain O2 sat of 88-92%  Acute on chronic diastolic CHF Remained stable on 4 L of oxygen Presented with volume overload, elevated BNP, pulmonary edema on chest x-ray Continue current cardiac medications 2D echo reviewed Continue strict I's and O's and daily weight Net I&O -110 cc  Elevated troponin suspect demand ischemia in the setting of acute on chronic hypoxia:  -Denies of any chest pain Troponin S peaked at 53 and trended down.   -Aspirin, Lipitor, metoprolol -As needed nitroglycerin Dual paced rhythm on EKG  QTC prolongation Twelve-lead EKG, QTC greater than 500 Avoid QTC prolonging agents  Depression Stable Continue home regimen  COPD (chronic obstructive pulmonary disease) ([place]):  Continue home regimen and oxygen supplementation to maintain O2 saturation greater than 90% Currently on 4 L of oxygen, satting 100%  Hypertension Blood pressure is stable and at goal -IV hydralazine as needed -Patient is on IV Lasix -Metoprolol  HLD: -Lipitor  Type II diabetes mellitus with renal manifestations and hyperglycemia ([place]): Hemoglobin A1c 6.7 on 03/30/2020  -Sliding scale insulin and Levemir  CKD-IIIB:  stable.   Currently at her baseline creatinine 1.3 with GFR 48 Avoid nephrotoxins Continue to monitor  Paroxysmal A fib: Rate controlled on metoprolol On amiodarone for rhythm control On Eliquis for CVA prevention  Bilateral transmetatarsal amputation   DVT ppx:  Eliquis Code Status: Full code Family Communication: None at bedside.  Consults called:  none   Status is:  Inpatient    Dispo:  Patient From: [place]  Planned Disposition: [place]  Expected discharge date: 04/03/20  Medically stable for discharge: No, due to ongoing management of acute on chronic diastolic CHF, ongoing diuresing.         Objective: Vitals:   04/01/20 2150 04/02/20 0255 04/02/20 0500 04/02/20 0742  BP: 127/85 (!) 142/67  98/86  Pulse: 74 73  100  Resp: 20 20  20   Temp: 98.3 F (36.8 C) 97.8 F (36.6 C)  98.2 F (36.8 C)  TempSrc: Oral Oral  Oral  SpO2: 99% 100%  100%  Weight:   98.6 kg   Height:        Intake/Output Summary (Last 24 hours) at 04/02/2020 1052 Last data filed at 04/02/2020 1011 Gross per 24 hour  Intake 480 ml  Output 1150 ml  Net -670 ml   Filed Weights   03/30/20 1735 03/31/20 0508 04/02/20 0500  Weight: 100.6 kg 99.8 kg 98.6 kg    Exam:    Physical Exam:   General:  Alert, oriented, cooperative, no distress;   HEENT:  Normocephalic, PERRL, otherwise with in Normal limits   Neuro:  CNII-XII intact. , normal motor and sensation, reflexes intact   Lungs:   Clear to auscultation BL, Respirations unlabored, no wheezes / crackles  Cardio:    S1/S2, RRR, No murmure, No Rubs or Gallops   Abdomen:   Soft, non-tender, bowel sounds active all four quadrants,  no guarding or peritoneal signs.  Muscular skeletal:  Limited exam - in bed, able to move all 4 extremities, Normal strength,  2+ pulses,  symmetric, +2 pitting edema  Skin:  Dry, warm to touch, negative for any Rashes, No open wounds  Wounds: Please see nursing documentation          Data Reviewed: CBC: Recent Labs  Lab 03/30/20 1339 03/31/20 0442  WBC 7.5 7.8  NEUTROABS 5.7  --   HGB 8.3* 8.1*  HCT 27.1* 25.1*  MCV 83.6 80.7  PLT 236 132   Basic Metabolic Panel: Recent Labs  Lab 03/30/20 1339 03/31/20 0442 04/01/20 0414 04/02/20 0629  NA 136 137 137 132*  K 4.3 3.9 3.9 5.1  CL 100 101 97* 94*  CO2 26 27 28 24   GLUCOSE  180* 123* 54* 195*  BUN 23 22 23  31*  CREATININE 1.37* 1.38* 1.36* 1.67*  CALCIUM 8.9 8.9 8.9 9.5  MG 1.8 1.8  --  2.2  PHOS  --   --   --  4.6   GFR: Estimated Creatinine Clearance: 40.4 mL/min (A) (by C-G formula based on SCr of 1.67 mg/dL (H)). Liver Function Tests: Recent Labs  Lab 03/30/20 1339  AST 53*  ALT 22  ALKPHOS 140*  BILITOT 1.2  PROT 7.5  ALBUMIN 3.4*    Recent Labs    03/30/20 1858  HGBA1C 6.7*   CBG: Recent Labs  Lab 04/01/20 0925 04/01/20 1150 04/01/20 1714 04/01/20 2147 04/02/20 0752  GLUCAP 76 128* 210* 209* 191*   Lipid Profile: Recent Labs    03/31/20 0442  CHOL 85  HDL 42  LDLCALC 31  TRIG 60  CHOLHDL 2.0   Thyroid Function Tests: No results for input(s): TSH, T4TOTAL, FREET4, T3FREE, THYROIDAB in the last 72 hours. Anemia Panel: No results for input(s): VITAMINB12, FOLATE, FERRITIN, TIBC, IRON, RETICCTPCT in the  last 72 hours. Urine analysis:    Component Value Date/Time   COLORURINE AMBER (A) 02/23/2019 1226   APPEARANCEUR CLOUDY (A) 02/23/2019 1226   LABSPEC 1.033 (H) 02/23/2019 1226   PHURINE 5.0 02/23/2019 1226   GLUCOSEU NEGATIVE 02/23/2019 1226   HGBUR NEGATIVE 02/23/2019 1226   BILIRUBINUR SMALL (A) 02/23/2019 1226   KETONESUR 5 (A) 02/23/2019 1226   PROTEINUR 30 (A) 02/23/2019 1226   NITRITE NEGATIVE 02/23/2019 1226   LEUKOCYTESUR NEGATIVE 02/23/2019 1226   Sepsis Labs: @LABRCNTIP (procalcitonin:4,lacticidven:4)  ) Recent Results (from the past 240 hour(s))  SARS Coronavirus 2 by RT PCR (hospital order, performed in [place] hospital lab) Nasopharyngeal Nasopharyngeal Swab     Status: None   Collection Time: 03/30/20  1:51 PM   Specimen: Nasopharyngeal Swab  Result Value Ref Range Status   SARS Coronavirus 2 NEGATIVE NEGATIVE Final    Comment: (NOTE) SARS-CoV-2 target nucleic acids are NOT DETECTED.  The SARS-CoV-2 RNA is generally detectable in upper and lower respiratory specimens during the acute phase  of infection. The lowest concentration of SARS-CoV-2 viral copies this assay can detect is 250 copies / mL. A negative result does not preclude SARS-CoV-2 infection and should not be used as the sole basis for treatment or other patient management decisions.  A negative result may occur with improper specimen collection / handling, submission of specimen other than nasopharyngeal swab, presence of viral mutation(s) within the areas targeted by this assay, and inadequate number of viral copies (<250 copies / mL). A negative result must be combined with clinical observations, patient history, and epidemiological information.  Fact Sheet for Patients:   StrictlyIdeas.no  Fact Sheet for Healthcare Providers: BankingDealers.co.za  This test is not yet approved or  cleared by the [place] FDA and has been authorized for detection and/or diagnosis of SARS-CoV-2 by FDA under an Emergency Use Authorization (EUA).  This EUA will remain in effect (meaning this test can be used) for the duration of the COVID-19 declaration under Section 564(b)(1) of the Act, 21 U.S.C. section 360bbb-3(b)(1), unless the authorization is terminated or revoked sooner.  Performed at [facility], [address] [place], [place] [address]   MRSA PCR Screening     Status: None   Collection Time: 03/30/20 10:23 PM   Specimen: Nasal Mucosa; Nasopharyngeal  Result Value Ref Range Status   MRSA by PCR NEGATIVE NEGATIVE Final    Comment:        The GeneXpert MRSA Assay (FDA approved for NASAL specimens only), is one component of a comprehensive MRSA colonization surveillance program. It is not intended to diagnose MRSA infection nor to guide or monitor treatment for MRSA infections. Performed at [facility], [address], [place],  [address]       Studies: No results found.  Scheduled Meds: . amiodarone  200 mg Oral Daily   . apixaban  5 mg Oral BID  . aspirin EC  81 mg Oral Daily  . atorvastatin  20 mg Oral Daily  . docusate sodium  100 mg Oral BID  . ferrous sulfate  325 mg Oral Q breakfast  . [START ON 04/03/2020] furosemide  40 mg Intravenous Daily  . gabapentin  600 mg Oral QID  . insulin aspart  0-5 Units Subcutaneous QHS  . insulin aspart  0-9 Units Subcutaneous TID WC  . ipratropium-albuterol  3 mL Nebulization Q6H  . methylPREDNISolone (SOLU-MEDROL) injection  60 mg Intravenous Q12H  . metoprolol succinate  12.5 mg Oral  Daily  . multivitamin  1 tablet Oral Daily  . mupirocin cream   Topical BID  . pantoprazole  40 mg Oral Daily  . polyethylene glycol  17 g Oral Daily  . sertraline  100 mg Oral Daily  . sodium chloride flush  3 mL Intravenous Q12H  . cyanocobalamin  1,000 mcg Oral Daily  . zinc sulfate  220 mg Oral Daily    Continuous Infusions: . sodium chloride       LOS: 3 days     Deatra James, MD [facility] Pager [phone]  If 7PM-7AM, please contact night-coverage [email or web address] Password [facility] 04/02/2020, 10:52 AM

## 2020-04-02 NOTE — Progress Notes (Signed)
Initial Nutrition Assessment  DOCUMENTATION CODES:   Obesity unspecified  INTERVENTION:   Nepro Shake po BID, each supplement provides 425 kcal and 19 grams protein  MVI daily   Liberalize diet   NUTRITION DIAGNOSIS:   Inadequate oral intake related to acute illness as evidenced by per patient/family report.  GOAL:   Patient will meet greater than or equal to 90% of their needs  MONITOR:   PO intake, Supplement acceptance, Labs, Weight trends, Skin, I & O's  REASON FOR ASSESSMENT:   Consult Assessment of nutrition requirement/status  ASSESSMENT:   59 y.o. female with medical history significant of hypertension, hyperlipidemia, diabetes mellitus, COPD on 3 L oxygen, asthma, depression, OSA, ICD placement, dCHF, HOCM s/p 09/2019 [facility] myectomy with a prolonged hospital course and multiple complications, CKD-III, A fib on Eliquis, who presents with shortness breath.   Met with pt in room today. Pt is known to this RD from previous admits. Pt reports decreased appetite and oral intake over the past few months as she reports that she has been in rehab since February. Pt reports that she has been eating fair and that she has been drinking vanilla Boost. Per chart, pt is eating <60% of meals in hospital. RD will order supplements and MVI to help pt meet her estimated needs. Per chart, pt's admit weight is up ~10lbs from her UBW. Pt is now down 5lbs since admit.   RD has provided pt with low sodium diet educations multiple times in the past. RD reiterated low sodium diet today and left copies of diet handouts with pt.   Medications reviewed and include: aspirin, colace, ferrous sulfate, lasix, insulin, solu-medrol, protonix, miralax, B12, zinc   Labs reviewed: Na 132(L), BUN 31(H), creat 1.67(H), P 4.6 wnl, Mg 2.2 wnl Hgb 8.1(L), Hct 25.1(L) cbgs- 191, 208 x 24 hrs AIC 6.7(H)- 8/15  NUTRITION - FOCUSED PHYSICAL EXAM:    Most Recent Value  Orbital Region No depletion  Upper Arm  Region No depletion  Thoracic and Lumbar Region No depletion  Buccal Region No depletion  Temple Region No depletion  Clavicle Bone Region No depletion  Clavicle and Acromion Bone Region No depletion  Scapular Bone Region No depletion  Dorsal Hand No depletion  Patellar Region No depletion  Anterior Thigh Region No depletion  Posterior Calf Region No depletion  Edema (RD Assessment) Moderate  Hair Reviewed  Eyes Reviewed  Mouth Reviewed  Skin Reviewed  Nails Reviewed     Diet Order:   Diet Order            Diet heart healthy/carb modified Room service appropriate? Yes; Fluid consistency: Thin; Fluid restriction: 1500 mL Fluid  Diet effective now                EDUCATION NEEDS:   Education needs have been addressed  Skin:  Skin Assessment: Reviewed RN Assessment (wounds fingers and L foot)  Last BM:  8/18- type 4  Height:   Ht Readings from Last 1 Encounters:  03/30/20 '5\' 4"'  (1.626 m)    Weight:   Wt Readings from Last 1 Encounters:  04/02/20 98.6 kg    Ideal Body Weight:  54.5 kg  BMI:  Body mass index is 37.32 kg/m.  Estimated Nutritional Needs:   Kcal:  2000-2300kcal/day  Protein:  100-115g/day  Fluid:  UOP +1L  Koleen Distance MS, RD, LDN Please refer to [facility] for RD and/or RD on-call/weekend/after hours pager

## 2020-04-02 NOTE — Care Management Important Message (Signed)
Important Message  Patient Details  Name: Julie Bender MRN: [id number] Date of Birth: 05-22-1961   Medicare Important Message Given:  Yes     Loann Quill 04/02/2020, 11:50 AM

## 2020-04-03 LAB — BASIC METABOLIC PANEL
Anion gap: 13 (ref 5–15)
BUN: 45 mg/dL — ABNORMAL HIGH (ref 8–23)
CO2: 24 mmol/L (ref 22–32)
Calcium: 9.1 mg/dL (ref 8.9–10.3)
Chloride: 94 mmol/L — ABNORMAL LOW (ref 98–111)
Creatinine, Ser: 2.09 mg/dL — ABNORMAL HIGH (ref 0.44–1.00)
GFR calc Af Amer: 29 mL/min — ABNORMAL LOW (ref >60)
GFR calc non Af Amer: 25 mL/min — ABNORMAL LOW (ref >60)
Glucose, Bld: 214 mg/dL — ABNORMAL HIGH (ref 70–99)
Potassium: 5.1 mmol/L (ref 3.5–5.1)
Sodium: 131 mmol/L — ABNORMAL LOW (ref 135–145)

## 2020-04-03 LAB — GLUCOSE, CAPILLARY
Glucose-Capillary: 210 mg/dL — ABNORMAL HIGH (ref 70–99)
Glucose-Capillary: 211 mg/dL — ABNORMAL HIGH (ref 70–99)
Glucose-Capillary: 248 mg/dL — ABNORMAL HIGH (ref 70–99)
Glucose-Capillary: 284 mg/dL — ABNORMAL HIGH (ref 70–99)

## 2020-04-03 MED ORDER — METHYLPREDNISOLONE SODIUM SUCC 125 MG IJ SOLR
60.0000 mg | INTRAMUSCULAR | Status: DC
  Administered 2020-04-04 – 2020-04-05 (×2): 60 mg via INTRAVENOUS
  Filled 2020-04-03 (×2): qty 2

## 2020-04-03 MED ORDER — IPRATROPIUM-ALBUTEROL 0.5-2.5 (3) MG/3ML IN SOLN
3.0000 mL | Freq: Three times a day (TID) | RESPIRATORY_TRACT | Status: DC
  Administered 2020-04-04 – 2020-04-15 (×33): 3 mL via RESPIRATORY_TRACT
  Filled 2020-04-03 (×37): qty 3

## 2020-04-03 MED ORDER — TORSEMIDE 20 MG PO TABS
20.0000 mg | ORAL_TABLET | Freq: Every day | ORAL | Status: DC
  Administered 2020-04-03: 20 mg via ORAL
  Filled 2020-04-03: qty 1

## 2020-04-03 MED ORDER — INSULIN DETEMIR 100 UNIT/ML ~~LOC~~ SOLN
15.0000 [IU] | Freq: Every day | SUBCUTANEOUS | Status: DC
  Administered 2020-04-03: 15 [IU] via SUBCUTANEOUS
  Filled 2020-04-03 (×2): qty 0.15

## 2020-04-03 NOTE — Progress Notes (Signed)
[place] and CH Mariea Clonts made an effort this afternoon to help get pt.'s AD signed, as follow-up from prior visits.  Pt. moved to new rm. on 2A b/c original room needed for COVID pt.  AD had been in drawer in original room (231) but did not seem to come to new rm. w/pt.  CHs asked for assistance from Pt.'s RN to check both rooms, but document not found.  CHs will need to redraft AD tomorrow.  pt. asleep when [facility] checked back in with her.

## 2020-04-03 NOTE — Progress Notes (Signed)
Physical Therapy Treatment Patient Details Name: Julie Bender MRN: [id number] DOB: Dec 24, 1960 Today's Date: 04/03/2020    History of Present Illness Pt is a 59 y.o. female with PMH significant of htn, HLD, DM, COPD on 3 L O2, asthma, depression, OSA, ICD placement, dCHF, HOCM s/p 09/2019 [facility] myectomy with a prolonged hospital course and multiple complications, toe amputations B, CKD-III, a-fib on Eliquis, who presents with a CHF exacerbation and acute respiratory failure with hypoxia.  Pt is from SNF.    PT Comments    PT/OT co-session performed.  Pt noted to be incontinent of bowel in bed requiring clean-up.  Max assist for logrolling in bed to assist with clean-up.  Mod to max assist x2 semi-supine to/from sitting edge of bed.  Pt requiring min to mod assist for sitting balance d/t posterior lean (pt able to briefly sit with close SBA for safety) but pt reporting sitting was uncomfortable after about 8 minutes (d/t R thigh pain in sitting) so pt assisted back to bed.  Pt became  SOB with activities but pt's O2 sats 96% or greater on 4.5 L O2 via nasal cannula during sessions activities (pt paced and given rest breaks to improve SOB symptoms).  HR around 75-76 bpm during sessions activities.  Will continue to focus on strengthening and progressive functional mobility per pt tolerance.   Follow Up Recommendations  SNF     Equipment Recommendations   (TBD at next facility)    Recommendations for Other Services       Precautions / Restrictions Precautions Precautions: Fall Restrictions Weight Bearing Restrictions: No    Mobility  Bed Mobility Overal bed mobility: Needs Assistance Bed Mobility: Supine to Sit;Sit to Supine;Rolling Rolling: Max assist (logrolling L and R in bed to assist with clean-up d/t bowel incontinence)   Supine to sit: Mod assist;Max assist;+2 for physical assistance Sit to supine: Mod assist;Max assist;+2 for physical assistance   General bed mobility  comments: assistance for trunk and B LE's; vc's for technique  Transfers                 General transfer comment: unable to progress d/t impaired sitting balance  Ambulation/Gait                 Stairs             Wheelchair Mobility    Modified Rankin (Stroke Patients Only)       Balance Overall balance assessment: Needs assistance Sitting-balance support: Bilateral upper extremity supported Sitting balance-Leahy Scale: Poor Sitting balance - Comments: pt with intermittent posterior lean requiring vc's and assist to correct Postural control: Posterior lean                                  Cognition Arousal/Alertness: Awake/alert Behavior During Therapy: WFL for tasks assessed/performed Overall Cognitive Status: Within Functional Limits for tasks assessed                                        Exercises      General Comments  Pt agreeable to PT session.      Pertinent Vitals/Pain Pain Assessment: Faces Faces Pain Scale: No hurt (6/10 R thigh pain in sitting) Pain Location: R thigh pain in sitting Pain Descriptors / Indicators: Tender;Sore Pain Intervention(s): Limited activity within patient's tolerance;Monitored during  session;Repositioned    Home Living                      Prior Function            PT Goals (current goals can now be found in the care plan section) Acute Rehab PT Goals Patient Stated Goal: to complete rehab and return home  PT Goal Formulation: With patient Time For Goal Achievement: 04/14/20 Potential to Achieve Goals: Fair Progress towards PT goals: Progressing toward goals    Frequency    Min 2X/week      PT Plan Current plan remains appropriate    Co-evaluation PT/OT/SLP Co-Evaluation/Treatment: Yes Reason for Co-Treatment: Complexity of the patient's impairments (multi-system involvement);For patient/therapist safety;To address functional/ADL transfers PT  goals addressed during session: Mobility/safety with mobility;Balance;Strengthening/ROM OT goals addressed during session: ADL's and self-care;Proper use of Adaptive equipment and DME      AM-PAC PT "6 Clicks" Mobility   Outcome Measure  Help needed turning from your back to your side while in a flat bed without using bedrails?: A Lot Help needed moving from lying on your back to sitting on the side of a flat bed without using bedrails?: Total Help needed moving to and from a bed to a chair (including a wheelchair)?: Total Help needed standing up from a chair using your arms (e.g., wheelchair or bedside chair)?: Total Help needed to walk in hospital room?: Total Help needed climbing 3-5 steps with a railing? : Total 6 Click Score: 7    End of Session Equipment Utilized During Treatment: Oxygen (4.5 L O2 via nasal cannula) Activity Tolerance: Patient limited by pain Patient left: in bed;with call bell/phone within reach;with bed alarm set Nurse Communication: Mobility status;Precautions;Other (comment) (pt's pain status during session and vitals) PT Visit Diagnosis: Unsteadiness on feet (R26.81);Muscle weakness (generalized) (M62.81);Other abnormalities of gait and mobility (R26.89)     Time: 1324-1400 PT Time Calculation (min) (ACUTE ONLY): 36 min  Charges:  $Therapeutic Activity: 23-37 mins                     Leitha Bleak, PT 04/03/20, 6:40 PM

## 2020-04-03 NOTE — Evaluation (Signed)
Occupational Therapy Evaluation Patient Details Name: Julie Bender MRN: [id number] DOB: [birth year] Today's Date: 04/03/2020    History of Present Illness Pt is a 59 y.o. female with PMH significant of htn, HLD, DM, COPD on 3 L O2, asthma, depression, OSA, ICD placement, dCHF, HOCM s/p 09/2019 [facility] myectomy with a prolonged hospital course and multiple complications, toe amputations B, CKD-III, a-fib on Eliquis, who presents with a CHF exacerbation and acute respiratory failure with hypoxia.  Pt is from SNF.   Clinical Impression   Ms. Ra was seen for OT evaluation this date. Prior to hospital admission, pt was receiving care in a STR. Nsg reports rehab staff stated pt was mobile and fairly indep until a few months PTA when she began to decline and has required near total care at the rehab facility. Pt reports she is generally able to feed herself "when my hand is not shaky" and requires bed-level assist for most ADL mgt. Currently pt demonstrates impairments impaired functional use of BUE 2/2 necrotic tissue on majority of her fingers of her LUE and thumb/index finger of her RUE, decreased cardiopulmonary status, decreased activity tolerance, and increased pain with activity. Pt currently requires MAX-TOTAL assist for LB ADL management including dressing, bathing, and toileting at bed level. She is able to come to sit at EOB given +2 MOD/MAX A and requires close supervision to MOD A to maintain sitting balance at EOB 2/2 fluctuating posterior lean. During session, pt states she would like to continue to improve/maintain her strength and regain as much independence as possible. Pt would benefit from skilled OT services to address noted impairments and functional limitations (see below for any additional details) in order to maximize safety and independence while minimizing falls risk and caregiver burden. Upon hospital discharge, recommend STR to maximize pt safety and return to PLOF.      Follow Up  Recommendations  SNF    Equipment Recommendations   (TBD at next venue of care)    Recommendations for Other Services       Precautions / Restrictions Precautions Precautions: Fall Restrictions Weight Bearing Restrictions: No      Mobility Bed Mobility Overal bed mobility: Needs Assistance Bed Mobility: Supine to Sit;Sit to Supine;Rolling Rolling: Max assist   Supine to sit: Mod assist;Max assist;+2 for physical assistance Sit to supine: Mod assist;Max assist;+2 for physical assistance   General bed mobility comments: assistance for trunk and B LE's; vc's for technique  Transfers                 General transfer comment: unable to progress d/t impaired sitting balance    Balance Overall balance assessment: Needs assistance Sitting-balance support: Bilateral upper extremity supported Sitting balance-Leahy Scale: Poor Sitting balance - Comments: pt with intermittent posterior lean requiring vc's and assist to correct Postural control: Posterior lean                                 ADL either performed or assessed with clinical judgement   ADL Overall ADL's : Needs assistance/impaired                                       General ADL Comments: Pt significantly functionally limited by gneralized weakness, decreased activity tolerance, and cardiopulmonary status. She requires MAX A to don bilat socks at bed level. +2  MOD/MAX A for bed mobility and SUP-MOD A to maintain static sitting balance at EOB.     Vision Baseline Vision/History: Wears glasses Patient Visual Report: No change from baseline       Perception     Praxis      Pertinent Vitals/Pain Pain Assessment: Faces Faces Pain Scale: No hurt (6/10 pin in R hip with sitting.) Pain Location: R thigh pain in sitting Pain Descriptors / Indicators: Tender;Sore Pain Intervention(s): Limited activity within patient's tolerance;Monitored during session;Repositioned;Utilized  relaxation techniques     Hand Dominance Right   Extremity/Trunk Assessment Upper Extremity Assessment Upper Extremity Assessment: Generalized weakness (Necrotic tissue noted on BUE with LUE digits 2-5 black to IP joints. Pt RUE 1st and 2nd digit scabbed and necrotic to DIP)   Lower Extremity Assessment Lower Extremity Assessment: Generalized weakness       Communication Communication Communication: No difficulties   Cognition Arousal/Alertness: Awake/alert Behavior During Therapy: WFL for tasks assessed/performed Overall Cognitive Status: Within Functional Limits for tasks assessed                                     General Comments  Pt vitals monitored t/o session and remain WFL with pt on 4.5 L [place]. SpO2 remains >96% HR remains 75-77 t/o session.    Exercises Other Exercises Other Exercises: OT facilitates bed mobility, bed level toileting c total assist for peri-care mgt after pt had episode of bowel/bladder incontenence, seated grooming at EOB (face washing), and energy conservation education.   Shoulder Instructions      Home Living Family/patient expects to be discharged to:: Private residence Living Arrangements: Parent;Other relatives Available Help at Discharge: Family Type of Home: House                           Additional Comments: patient most recently at [facility] with the goal eventually to discharge home with family support       Prior Functioning/Environment Level of Independence: Needs assistance  Gait / Transfers Assistance Needed: patient reports she does not ambulate at this time. assistance required by therapist at SNF with intermittent use of mechanical lift to get OOB to chair.  ADL's / Homemaking Assistance Needed: Pt reports she is generally able to feed herself at bed level, otherwise she requires assistance from SNF staff for most BADL mgt.   Comments: patient reports getting PT 5 days a week at SNF, and OT several  days per week         OT Problem List: Decreased strength;Decreased coordination;Cardiopulmonary status limiting activity;Decreased activity tolerance;Decreased safety awareness;Impaired balance (sitting and/or standing);Decreased knowledge of use of DME or AE;Impaired UE functional use      OT Treatment/Interventions: Self-care/ADL training;Therapeutic exercise;Therapeutic activities;DME and/or AE instruction;Patient/family education;Energy conservation;Balance training;Neuromuscular education    OT Goals(Current goals can be found in the care plan section) Acute Rehab OT Goals Patient Stated Goal: to complete rehab and return home  OT Goal Formulation: With patient Time For Goal Achievement: 04/17/20 Potential to Achieve Goals: Good ADL Goals Pt Will Perform Grooming: with set-up;with supervision;sitting;with adaptive equipment (c LRAD PRN for improved safety and functional indep.) Pt Will Transfer to Toilet: bedside commode;with min assist;with +2 assist (c LRAD PRN for improved safety and functional indep.) Pt Will Perform Toileting - Clothing Manipulation and hygiene: with mod assist;sitting/lateral leans;with adaptive equipment (c LRAD PRN for improved safety  and functional indep upon hospital DC.) Additional ADL Goal #1: Pt will independently verbalize a plan to implement at least 3 learned energy conservation strategies into her daily routines/home environment for improved safety and functional independence upon hospital DC.  OT Frequency: Min 1X/week   Barriers to D/C: Inaccessible home environment;Decreased caregiver support          Co-evaluation PT/OT/SLP Co-Evaluation/Treatment: Yes Reason for Co-Treatment: Complexity of the patient's impairments (multi-system involvement);For patient/therapist safety;To address functional/ADL transfers PT goals addressed during session: Mobility/safety with mobility;Balance;Strengthening/ROM OT goals addressed during session: ADL's and  self-care;Proper use of Adaptive equipment and DME      AM-PAC OT "6 Clicks" Daily Activity     Outcome Measure Help from another person eating meals?: A Little Help from another person taking care of personal grooming?: A Little Help from another person toileting, which includes using toliet, bedpan, or urinal?: Total Help from another person bathing (including washing, rinsing, drying)?: A Lot Help from another person to put on and taking off regular upper body clothing?: A Little Help from another person to put on and taking off regular lower body clothing?: A Lot 6 Click Score: 14   End of Session Nurse Communication: Mobility status;Other (comment) (Pt noted to be having continued BM and likely needs to be cleaned up, vitals during session.)  Activity Tolerance: Patient tolerated treatment well Patient left: in bed;with call bell/phone within reach;with bed alarm set  OT Visit Diagnosis: Other abnormalities of gait and mobility (R26.89);Muscle weakness (generalized) (M62.81)                Time: 1310-1401 OT Time Calculation (min): 51 min Charges:  OT General Charges $OT Visit: 1 Visit OT Evaluation $OT Eval Moderate Complexity: 1 Mod OT Treatments $Self Care/Home Management : 8-22 mins  Shara Blazing, M.S., OTR/L Ascom: [phone] 04/03/20, 3:53 PM

## 2020-04-03 NOTE — Progress Notes (Signed)
PROGRESS NOTE  Julie Bender ZHG:[id number] DOB: Nov 27, 1960 DOA: 03/30/2020 PCP: Deanne Coffer, MD  HPI/Recap of past 24 hours: HPI: Julie Bender is a 59 y.o. 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  ED Course: BNP 1811, troponin 50, COVID-19 PCR negative, RR 25, oxygen sat 93% on 3 L oxygen currently, chest x-ray showed cardiomegaly and vascular congestion.       Subjective: Still SOB... on Neb this AM  Stable no issues over night.. Still on 4 Ls  Of oxygen satting 99%-  Assessment/Plan: Principal Problem:   Acute on chronic diastolic CHF (congestive heart failure) (HCC) Active Problems:   Depression   Elevated troponin   Acute on chronic respiratory failure with hypoxia (HCC)   COPD (chronic obstructive pulmonary disease) (HCC)   Hypertension   Type II diabetes mellitus with renal manifestations (HCC)   CKD (chronic kidney disease), stage IIIa   Atrial fibrillation, chronic (HCC)   Acute on chronic respiratory failure with hypoxia  secondary to pulmonary edema in the setting of acute on chronic diastolic CHF (congestive heart failure) ([place]):  Still havinf SOB .Marland Kitchen. nebs this AM, Still on 4 L of oxygen sating 99%   2D echo on 05/21/2019 showed EF > 75% patient has a bilateral 2+ leg edema, crackles on auscultation, elevated BNP 1811, chest x-ray showed cardiomegaly and vascular congestion, suggestive of pulmonary edema Ongoing diuresing with IV Lasix 40 mg twice daily >>> will reduce it down to daily as creatinine has increased today  We will taper O2 demand down to  maintain O2 sat of 88-92%  Acute on chronic diastolic CHF Remained stable on 4 L of oxygen Presented with volume overload, elevated BNP, pulmonary edema on chest x-ray Continue current cardiac medications 2D echo reviewed Continue strict I's and O's and daily weight Net I&O -110 cc  Elevated troponin suspect demand ischemia in the setting of acute on chronic hypoxia:  -stable -- no chest pain  Troponin S peaked at 53 and trended down. ..  -Aspirin, Lipitor, metoprolol -As needed nitroglycerin Dual paced rhythm on EKG  QTC prolongation Stable  Twelve-lead EKG, QTC greater than 500 Avoid QTC prolonging agents  Depression Stable Continue home regimen  COPD (chronic obstructive pulmonary disease) ([place]):  Continue home regimen and oxygen supplementation to maintain O2 saturation greater than 90% Currently on 4 L of oxygen, satting 100%  Hypertension Blood pressure is stable and at goal -IV hydralazine as needed -Patient is on IV Lasix  >>> changed to [place] -Metoprolol  HLD: -Lipitor  Type II diabetes mellitus with renal manifestations and hyperglycemia ([place]): Hemoglobin A1c 6.7 on 03/30/2020  -Sliding scale insulin and Levemir  CKD-IIIB:  stable.   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 Consulted   DVT ppx: Eliquis Code Status: Full code Family Communication: None at bedside.  Consults called:  none   Status is: Inpatient    Dispo:  Patient From: [place]  Disposition: [place]  Expected discharge date: 04/04/2020  Medically stable for discharge: No, due to ongoing management of acute on chronic diastolic CHF, ongoing  diuresing.     Objective: Vitals:   04/02/20 1957 04/03/20 0423 04/03/20 0440 04/03/20 0748  BP:  140/87  137/86  Pulse:  74  74  Resp:  (!) 24  20  Temp:  98.4 F (36.9 C)  98.7 F (37.1 C)  TempSrc:  Oral  Oral  SpO2: 100% 99%    Weight:   102.2 kg   Height:        Intake/Output Summary (Last 24 hours) at 04/03/2020 1325 Last data filed at 04/03/2020 0423 Gross per 24 hour  Intake 240 ml  Output 151 ml  Net 89 ml   Filed Weights   03/31/20 0508 04/02/20 0500 04/03/20 0440  Weight: 99.8 kg 98.6 kg 102.2 kg    Exam:    Physical Exam:   General:  Alert, oriented, cooperative, no distress;   HEENT:  Normocephalic, PERRL, otherwise with in Normal limits   Neuro:  CNII-XII intact. , normal motor and sensation, reflexes intact   Lungs:   Clear to auscultation BL, Respirations unlabored, no wheezes / crackles  Cardio:    S1/S2, RRR, No murmure, No Rubs or Gallops   Abdomen:   Soft, non-tender, bowel sounds active all four quadrants,  no guarding or peritoneal signs.  Muscular skeletal:  Limited exam - in bed, able to move all 4 extremities, gen. weekness,  2+ pulses,  symmetric, +1 pitting edema, lower extremity toes amputated, left hand fingers gangrene at, dry.  Skin:  Dry, warm to touch, negative for any Rashes, No open wounds  Wounds: lower ext. Amput. Gangrene fingers. Please see nursing documentation             Data Reviewed: CBC: Recent Labs  Lab 03/30/20 1339 03/31/20 0442  WBC 7.5 7.8  NEUTROABS 5.7  --   HGB 8.3* 8.1*  HCT 27.1* 25.1*  MCV 83.6 80.7  PLT 236 938   Basic Metabolic Panel: Recent Labs  Lab 03/30/20 1339 03/31/20 0442 04/01/20 0414 04/02/20 0629 04/03/20 0526  NA 136 137 137 132* 131*  K 4.3 3.9 3.9 5.1 5.1  CL 100 101 97* 94* 94*  CO2 26 27 28 24 24   GLUCOSE 180* 123* 54* 195* 214*  BUN 23 22 23  31* 45*  CREATININE 1.37* 1.38* 1.36* 1.67* 2.09*  CALCIUM 8.9 8.9 8.9 9.5 9.1  MG 1.8 1.8  --  2.2  --   PHOS  --   --    --  4.6  --    GFR: Estimated Creatinine Clearance: 32.9 mL/min (A) (by C-G formula based on SCr of 2.09 mg/dL (H)). Liver Function Tests: Recent Labs  Lab 03/30/20 1339  AST 53*  ALT 22  ALKPHOS 140*  BILITOT 1.2  PROT 7.5  ALBUMIN 3.4*    No results for input(s): HGBA1C in the last 72 hours. CBG: Recent Labs  Lab 04/02/20 1224 04/02/20 1653 04/02/20 2141 04/03/20 0751 04/03/20 1216  GLUCAP 208* 239* 279* 210* 211*   Lipid Profile: No results for input(s): CHOL, HDL, LDLCALC, TRIG, CHOLHDL, LDLDIRECT in the last 72 hours. Thyroid Function Tests: No results for input(s): TSH, T4TOTAL, FREET4, T3FREE, THYROIDAB in the last 72 hours. Anemia Panel: No results for input(s): VITAMINB12, FOLATE, FERRITIN, TIBC, IRON, RETICCTPCT in the last 72 hours. Urine analysis:    Component Value Date/Time   COLORURINE AMBER (A)  02/23/2019 1226   APPEARANCEUR CLOUDY (A) 02/23/2019 1226   LABSPEC 1.033 (H) 02/23/2019 1226   PHURINE 5.0 02/23/2019 1226   GLUCOSEU NEGATIVE 02/23/2019 1226   HGBUR NEGATIVE 02/23/2019 1226   BILIRUBINUR SMALL (A) 02/23/2019 1226   KETONESUR 5 (A) 02/23/2019 1226   PROTEINUR 30 (A) 02/23/2019 1226   NITRITE NEGATIVE 02/23/2019 1226   LEUKOCYTESUR NEGATIVE 02/23/2019 1226   Sepsis Labs: @LABRCNTIP (procalcitonin:4,lacticidven:4)  ) Recent Results (from the past 240 hour(s))  SARS Coronavirus 2 by RT PCR (hospital order, performed in [facility] hospital lab) Nasopharyngeal Nasopharyngeal Swab     Status: None   Collection Time: 03/30/20  1:51 PM   Specimen: Nasopharyngeal Swab  Result Value Ref Range Status   SARS Coronavirus 2 NEGATIVE NEGATIVE Final    Comment: (NOTE) SARS-CoV-2 target nucleic acids are NOT DETECTED.  The SARS-CoV-2 RNA is generally detectable in upper and lower respiratory specimens during the acute phase of infection. The lowest concentration of SARS-CoV-2 viral copies this assay can detect is 250 copies / mL. A negative  result does not preclude SARS-CoV-2 infection and should not be used as the sole basis for treatment or other patient management decisions.  A negative result may occur with improper specimen collection / handling, submission of specimen other than nasopharyngeal swab, presence of viral mutation(s) within the areas targeted by this assay, and inadequate number of viral copies (<250 copies / mL). A negative result must be combined with clinical observations, patient history, and epidemiological information.  Fact Sheet for Patients:   StrictlyIdeas.no  Fact Sheet for Healthcare Providers: BankingDealers.co.za  This test is not yet approved or  cleared by the [place] FDA and has been authorized for detection and/or diagnosis of SARS-CoV-2 by FDA under an Emergency Use Authorization (EUA).  This EUA will remain in effect (meaning this test can be used) for the duration of the COVID-19 declaration under Section 564(b)(1) of the Act, 21 U.S.C. section 360bbb-3(b)(1), unless the authorization is terminated or revoked sooner.  Performed at [facility], [address] [place], [place] [address]   MRSA PCR Screening     Status: None   Collection Time: 03/30/20 10:23 PM   Specimen: Nasal Mucosa; Nasopharyngeal  Result Value Ref Range Status   MRSA by PCR NEGATIVE NEGATIVE Final    Comment:        The GeneXpert MRSA Assay (FDA approved for NASAL specimens only), is one component of a comprehensive MRSA colonization surveillance program. It is not intended to diagnose MRSA infection nor to guide or monitor treatment for MRSA infections. Performed at [facility], [address] [place],  [address]       Studies: No results found.  Scheduled Meds: . amiodarone  200 mg Oral Daily  . apixaban  5 mg Oral BID  . aspirin EC  81 mg Oral Daily  . atorvastatin  20 mg Oral Daily  . docusate sodium  100  mg Oral BID  . feeding supplement (NEPRO CARB STEADY)  237 mL Oral BID BM  . ferrous sulfate  325 mg Oral Q breakfast  . gabapentin  600 mg Oral QID  . insulin aspart  0-5 Units Subcutaneous QHS  . insulin aspart  0-9 Units Subcutaneous TID WC  . insulin detemir  15 Units Subcutaneous QHS  . ipratropium-albuterol  3 mL Nebulization Q6H  . [START ON 04/04/2020] methylPREDNISolone (SOLU-MEDROL) injection  60 mg Intravenous Q24H  . metoprolol succinate  12.5 mg Oral Daily  .  multivitamin  1 tablet Oral Daily  . multivitamin with minerals  1 tablet Oral Daily  . mupirocin cream   Topical BID  . pantoprazole  40 mg Oral Daily  . polyethylene glycol  17 g Oral Daily  . sertraline  100 mg Oral Daily  . sodium chloride flush  3 mL Intravenous Q12H  . torsemide  20 mg Oral Daily  . cyanocobalamin  1,000 mcg Oral Daily  . zinc sulfate  220 mg Oral Daily    Continuous Infusions: . sodium chloride       LOS: 4 days     Deatra James, MD [facility] Pager [phone]  If 7PM-7AM, please contact night-coverage [email or web address] Password [email or web address] 04/03/2020, 1:25 PM

## 2020-04-04 ENCOUNTER — Inpatient Hospital Stay: Payer: Medicare Other

## 2020-04-04 LAB — URINALYSIS, ROUTINE W REFLEX MICROSCOPIC
Bilirubin Urine: NEGATIVE
Glucose, UA: NEGATIVE mg/dL
Ketones, ur: NEGATIVE mg/dL
Nitrite: NEGATIVE
Protein, ur: NEGATIVE mg/dL
Specific Gravity, Urine: 1.008 (ref 1.005–1.030)
pH: 5 (ref 5.0–8.0)

## 2020-04-04 LAB — GLUCOSE, CAPILLARY
Glucose-Capillary: 237 mg/dL — ABNORMAL HIGH (ref 70–99)
Glucose-Capillary: 222 mg/dL — ABNORMAL HIGH (ref 70–99)
Glucose-Capillary: 287 mg/dL — ABNORMAL HIGH (ref 70–99)

## 2020-04-04 LAB — BASIC METABOLIC PANEL
Anion gap: 11 (ref 5–15)
BUN: 55 mg/dL — ABNORMAL HIGH (ref 8–23)
CO2: 23 mmol/L (ref 22–32)
Calcium: 9.1 mg/dL (ref 8.9–10.3)
Chloride: 96 mmol/L — ABNORMAL LOW (ref 98–111)
Creatinine, Ser: 2.3 mg/dL — ABNORMAL HIGH (ref 0.44–1.00)
GFR calc Af Amer: 26 mL/min — ABNORMAL LOW (ref >60)
GFR calc non Af Amer: 22 mL/min — ABNORMAL LOW (ref >60)
Glucose, Bld: 226 mg/dL — ABNORMAL HIGH (ref 70–99)
Potassium: 5.1 mmol/L (ref 3.5–5.1)
Sodium: 130 mmol/L — ABNORMAL LOW (ref 135–145)

## 2020-04-04 LAB — PROTEIN / CREATININE RATIO, URINE
Creatinine, Urine: 37 mg/dL
Protein Creatinine Ratio: 0.19 mg/mg{Cre} — ABNORMAL HIGH (ref 0.00–0.15)
Total Protein, Urine: 7 mg/dL

## 2020-04-04 LAB — CBC
HCT: 25.4 % — ABNORMAL LOW (ref 36.0–46.0)
Hemoglobin: 8.6 g/dL — ABNORMAL LOW (ref 12.0–15.0)
MCH: 25.8 pg — ABNORMAL LOW (ref 26.0–34.0)
MCHC: 33.9 g/dL (ref 30.0–36.0)
MCV: 76.3 fL — ABNORMAL LOW (ref 80.0–100.0)
Platelets: 294 10*3/uL (ref 150–400)
RBC: 3.33 MIL/uL — ABNORMAL LOW (ref 3.87–5.11)
RDW: 21.8 % — ABNORMAL HIGH (ref 11.5–15.5)
WBC: 11 10*3/uL — ABNORMAL HIGH (ref 4.0–10.5)
nRBC: 1.2 % — ABNORMAL HIGH (ref 0.0–0.2)

## 2020-04-04 MED ORDER — GABAPENTIN 300 MG PO CAPS
300.0000 mg | ORAL_CAPSULE | Freq: Four times a day (QID) | ORAL | Status: DC
  Administered 2020-04-04 – 2020-04-06 (×9): 300 mg via ORAL
  Filled 2020-04-04 (×9): qty 1

## 2020-04-04 MED ORDER — INSULIN DETEMIR 100 UNIT/ML ~~LOC~~ SOLN
20.0000 [IU] | Freq: Every day | SUBCUTANEOUS | Status: DC
  Administered 2020-04-04 – 2020-04-13 (×10): 20 [IU] via SUBCUTANEOUS
  Filled 2020-04-04 (×11): qty 0.2

## 2020-04-04 MED ORDER — FUROSEMIDE 10 MG/ML IJ SOLN
10.0000 mg/h | INTRAVENOUS | Status: DC
  Administered 2020-04-04: 5 mg/h via INTRAVENOUS
  Administered 2020-04-08: 8 mg/h via INTRAVENOUS
  Administered 2020-04-09 – 2020-04-16 (×8): 10 mg/h via INTRAVENOUS
  Filled 2020-04-04 (×12): qty 25

## 2020-04-04 MED ORDER — ALBUMIN HUMAN 25 % IV SOLN
12.5000 g | Freq: Two times a day (BID) | INTRAVENOUS | Status: DC
  Administered 2020-04-04 – 2020-04-14 (×20): 12.5 g via INTRAVENOUS
  Filled 2020-04-04 (×20): qty 50

## 2020-04-04 NOTE — Care Management Important Message (Signed)
Important Message  Patient Details  Name: Julie Bender MRN: [id number] Date of Birth: [birth year]   Medicare Important Message Given:  Yes     Dannette Barbara 04/04/2020, 2:12 PM

## 2020-04-04 NOTE — Consult Note (Signed)
[address] [place],  [address] Phone [phone]. Fax [phone]  Date: 04/04/2020                  Patient Name:  Julie Bender  MRN: [id number]  DOB: 06-11-1961  Age / Sex: 59 y.o., female         PCP: Deanne Coffer, MD                 Service Requesting Consult: IM/ Deatra James, MD                 Reason for Consult: ARF, edema            History of Present Illness: Patient is a 59 y.o. female with medical problems of HTN, HLD, DM, COPD, asthma, depression, OSA, ICD placement, diastolic CHF, h/o HOCM s/p myomectomy at [facility], prolonged hospitalization, CKD, A Fib, who was admitted to [facility] on 03/30/2020 for evaluation of SOB.   Patient is very somnolent.  Did not wake up for interview.  Over the last few days patient's daily has been.  She has began amount of generalized edema.  Nephrology consult has been requested to evaluate acute renal failure and to assist with diuresis.   Medications: Outpatient medications: Medications Prior to Admission  Medication Sig Dispense Refill Last Dose  . acetaminophen (TYLENOL) 500 MG tablet Take 1,000 mg by mouth every 6 (six) hours as needed.   Past Week at PRN  . albuterol (VENTOLIN HFA) 108 (90 Base) MCG/ACT inhaler Inhale 1 puff into the lungs every 6 (six) hours as needed for wheezing or shortness of breath.   Past Week at PRN  . amiodarone (PACERONE) 200 MG tablet Take 200 mg by mouth daily.    03/30/2020 at 0900  . apixaban (ELIQUIS) 5 MG TABS tablet Take 5 mg by mouth 2 (two) times daily.    03/30/2020 at 0900  . aspirin EC 81 MG tablet Take 81 mg by mouth daily.   03/30/2020 at 0900  . atorvastatin (LIPITOR) 20 MG tablet Take 20 mg by mouth daily.   03/30/2020 at 0900  . cyanocobalamin 1000 MCG tablet Take 1,000 mcg by mouth daily.   03/30/2020 at 0900  . docusate sodium (COLACE) 100 MG capsule Take 100 mg by mouth 2 (two) times daily.   03/30/2020 at 0900  . ferrous sulfate 325 (65 FE) MG tablet Take by  mouth.   03/30/2020 at 0900  . Fluticasone-Salmeterol (ADVAIR) 250-50 MCG/DOSE AEPB Inhale 1 puff into the lungs 2 (two) times daily.   03/30/2020 at 0800  . furosemide (LASIX) 40 MG tablet Take by mouth.   03/30/2020 at 0900  . gabapentin (NEURONTIN) 300 MG capsule Take 600 mg by mouth 4 (four) times daily.    03/30/2020 at 0900  . insulin aspart (NOVOLOG FLEXPEN) 100 UNIT/ML FlexPen Inject 5 Units into the skin 3 (three) times daily with meals.    03/30/2020 at 0915  . insulin detemir (LEVEMIR) 100 UNIT/ML injection Inject 42 Units into the skin daily.    Past Month at PRN  . insulin glargine (LANTUS) 100 UNIT/ML Solostar Pen Inject 12 Units into the skin as needed.    Past Month at PRN  . liraglutide (VICTOZA) 18 MG/3ML SOPN Inject 0.6 mg into the skin daily.    Past Month at PRN  . Melatonin 3 MG TBDP Take by mouth.   03/29/2020 at 2100  . metoprolol succinate (TOPROL-XL) 25 MG 24 hr tablet Take 12.5 mg  by mouth daily.    03/30/2020 at 0900  . multivitamin (RENA-VIT) TABS tablet Take 1 tablet by mouth daily.   03/30/2020 at 0900  . oxyCODONE (OXY IR/ROXICODONE) 5 MG immediate release tablet Take 1 tablet (5 mg total) by mouth every 6 (six) hours as needed for moderate pain. 20 tablet 0 Past Week at PRN  . pantoprazole (PROTONIX) 40 MG tablet Take by mouth.   03/30/2020 at 0930  . polyethylene glycol powder (GLYCOLAX/MIRALAX) 17 GM/SCOOP powder Take 17 g by mouth daily.    03/30/2020 at 1000  . sertraline (ZOLOFT) 100 MG tablet Take by mouth.   03/30/2020 at 0900  . traMADol (ULTRAM) 50 MG tablet Take 50 mg by mouth every 6 (six) hours as needed.    Past Week at PRN  . zinc sulfate 220 (50 Zn) MG capsule Take by mouth.   03/30/2020 at 0900    Current medications: Current Facility-Administered Medications  Medication Dose Route Frequency Provider Last Rate Last Admin  . 0.9 %  sodium chloride infusion  250 mL Intravenous PRN Ivor Costa, MD      . acetaminophen (TYLENOL) tablet 650 mg  650 mg Oral Q4H  PRN Ivor Costa, MD      . albuterol (PROVENTIL) (2.5 MG/3ML) 0.083% nebulizer solution 2.5 mg  2.5 mg Nebulization Q4H PRN Ivor Costa, MD      . amiodarone (PACERONE) tablet 200 mg  200 mg Oral Daily Ivor Costa, MD   200 mg at 04/04/20 0941  . apixaban (ELIQUIS) tablet 5 mg  5 mg Oral BID Ivor Costa, MD   5 mg at 04/04/20 1950  . aspirin EC tablet 81 mg  81 mg Oral Daily Ivor Costa, MD   81 mg at 04/04/20 9326  . atorvastatin (LIPITOR) tablet 20 mg  20 mg Oral Daily Ivor Costa, MD   20 mg at 04/04/20 0942  . dextromethorphan-guaiFENesin (MUCINEX DM) 30-600 MG per 12 hr tablet 1 tablet  1 tablet Oral BID PRN Ivor Costa, MD   1 tablet at 04/03/20 2054  . docusate sodium (COLACE) capsule 100 mg  100 mg Oral BID Ivor Costa, MD   100 mg at 04/03/20 2054  . feeding supplement (NEPRO CARB STEADY) liquid 237 mL  237 mL Oral BID BM Shahmehdi, Seyed A, MD   237 mL at 04/04/20 0943  . ferrous sulfate tablet 325 mg  325 mg Oral Q breakfast Ivor Costa, MD   325 mg at 04/04/20 0942  . gabapentin (NEURONTIN) capsule 300 mg  300 mg Oral QID Skipper Cliche A, MD   300 mg at 04/04/20 0941  . hydrALAZINE (APRESOLINE) injection 5 mg  5 mg Intravenous Q2H PRN Ivor Costa, MD      . insulin aspart (novoLOG) injection 0-5 Units  0-5 Units Subcutaneous QHS Kayleen Memos, DO   3 Units at 04/03/20 2220  . insulin aspart (novoLOG) injection 0-9 Units  0-9 Units Subcutaneous TID WC Irene Pap N, DO   3 Units at 04/04/20 0941  . insulin detemir (LEVEMIR) injection 20 Units  20 Units Subcutaneous QHS Shahmehdi, Seyed A, MD      . ipratropium-albuterol (DUONEB) 0.5-2.5 (3) MG/3ML nebulizer solution 3 mL  3 mL Nebulization TID Irene Pap N, DO   3 mL at 04/04/20 0723  . methylPREDNISolone sodium succinate (SOLU-MEDROL) 125 mg/2 mL injection 60 mg  60 mg Intravenous Q24H Shahmehdi, Seyed A, MD   60 mg at 04/04/20 0301  . metoprolol succinate (TOPROL-XL) 24  hr tablet 12.5 mg  12.5 mg Oral Daily Ivor Costa, MD   12.5 mg at  04/04/20 1093  . multivitamin (RENA-VIT) tablet 1 tablet  1 tablet Oral Daily Ivor Costa, MD   1 tablet at 04/04/20 [phone]  . multivitamin with minerals tablet 1 tablet  1 tablet Oral Daily Deatra James, MD   1 tablet at 04/04/20 [phone]  . mupirocin cream (BACTROBAN) 2 %   Topical BID Kayleen Memos, DO   Given at 04/04/20 0254  . ondansetron (ZOFRAN) injection 4 mg  4 mg Intravenous Q6H PRN Ivor Costa, MD      . oxyCODONE (Oxy IR/ROXICODONE) immediate release tablet 5 mg  5 mg Oral Q6H PRN Ivor Costa, MD   5 mg at 04/04/20 0302  . pantoprazole (PROTONIX) EC tablet 40 mg  40 mg Oral Daily Ivor Costa, MD   40 mg at 04/04/20 0942  . polyethylene glycol (MIRALAX / GLYCOLAX) packet 17 g  17 g Oral Daily Ivor Costa, MD   17 g at 04/03/20 1007  . sertraline (ZOLOFT) tablet 100 mg  100 mg Oral Daily Ivor Costa, MD   100 mg at 04/04/20 0942  . sodium chloride flush (NS) 0.9 % injection 3 mL  3 mL Intravenous Q12H Ivor Costa, MD   3 mL at 04/04/20 0944  . sodium chloride flush (NS) 0.9 % injection 3 mL  3 mL Intravenous PRN Ivor Costa, MD      . vitamin B-12 (CYANOCOBALAMIN) tablet 1,000 mcg  1,000 mcg Oral Daily Ivor Costa, MD   1,000 mcg at 04/04/20 [phone]  . zinc sulfate capsule 220 mg  220 mg Oral Daily Ivor Costa, MD   220 mg at 04/04/20 2376      Allergies: No Known Allergies    Past Medical History: Past Medical History:  Diagnosis Date  . Acute on chronic respiratory failure with hypoxia ([place])   . Asthma   . CHF (congestive heart failure) ([place])   . Chronic heart failure with preserved ejection fraction ([place])   . Chronic obstructive asthma ([place])   . COPD (chronic obstructive pulmonary disease) ([place])   . Diabetes mellitus without complication ([place])   . Diabetic retinopathy ([place])   . End stage renal disease on dialysis ([place])   . History of placement of internal cardiac defibrillator   . Hypertension   . Hypertrophic cardiomyopathy ([place])   . Hypertrophic cardiomyopathy ([place])   . Morbid  obesity ([place])   . Obstructive sleep apnea      Past Surgical History: Past Surgical History:  Procedure Laterality Date  . CARDIAC DEFIBRILLATOR PLACEMENT    . CHOLECYSTECTOMY    . RIGHT/LEFT HEART CATH AND CORONARY ANGIOGRAPHY N/A 06/19/2019   Procedure: RIGHT/LEFT HEART CATH AND CORONARY ANGIOGRAPHY;  Surgeon: Nelva Bush, MD;  Location: [facility];  Service: Cardiovascular;  Laterality: N/A;     Family History: Family History  Problem Relation Age of Onset  . Hypertrophic cardiomyopathy Sister      Social History: Social History   Socioeconomic History  . Marital status: Divorced    Spouse name: Not on file  . Number of children: Not on file  . Years of education: Not on file  . Highest education level: Not on file  Occupational History  . Not on file  Tobacco Use  . Smoking status: Passive Smoke Exposure - Never Smoker  . Smokeless tobacco: Never Used  Vaping Use  . Vaping Use: Never used  Substance  and Sexual Activity  . Alcohol use: No  . Drug use: Never  . Sexual activity: Not on file  Other Topics Concern  . Not on file  Social History Narrative  . Not on file   Social Determinants of Health   Financial Resource Strain:   . Difficulty of Paying Living Expenses: Not on file  Food Insecurity:   . Worried About Charity fundraiser in the Last Year: Not on file  . Ran Out of Food in the Last Year: Not on file  Transportation Needs:   . Lack of Transportation (Medical): Not on file  . Lack of Transportation (Non-Medical): Not on file  Physical Activity:   . Days of Exercise per Week: Not on file  . Minutes of Exercise per Session: Not on file  Stress:   . Feeling of Stress : Not on file  Social Connections:   . Frequency of Communication with Friends and Family: Not on file  . Frequency of Social Gatherings with Friends and Family: Not on file  . Attends Religious Services: Not on file  . Active Member of Clubs or Organizations: Not  on file  . Attends Archivist Meetings: Not on file  . Marital Status: Not on file  Intimate Partner Violence:   . Fear of Current or Ex-Partner: Not on file  . Emotionally Abused: Not on file  . Physically Abused: Not on file  . Sexually Abused: Not on file     Review of Systems: Not available as patient is very somnolent Gen:  HEENT:  CV:  Resp:  GI: GU :  MS:  Derm:    Psych: Heme:  Neuro:  Endocrine  Vital Signs: Blood pressure (!) 146/58, pulse 74, temperature 97.9 F (36.6 C), resp. rate 18, height 5\' 4"  (1.626 m), weight 102.9 kg, SpO2 96 %.  No intake or output data in the 24 hours ending 04/04/20 1127  Weight trends: Filed Weights   04/02/20 0500 04/03/20 0440 04/04/20 0300  Weight: 98.6 kg 102.2 kg 102.9 kg    Physical Exam: General:  No acute distress, laying in the bed  HEENT  anicteric, moist oral mucous membranes  Lungs:  Mild diffuse wheezing, mild basilar crackles, [place] O2  Heart::  regular rhythm, median sternotomy scar, 2/6 systolic murmur  Abdomen:  Soft, obese, nondistended  Extremities:  2-3+ tight pitting edema, b/l partial foot amputations  Neurologic:  Somnolent  Skin:  Warm, left hand dry gangrene 1-4 fingers, right hand -gangrene of the tip of right thumb and index finger  Foley:  Urinary collecting system is in place       Lab results: Basic Metabolic Panel: Recent Labs  Lab 03/30/20 1339 03/30/20 1339 03/31/20 0442 04/01/20 0414 04/02/20 0629 04/03/20 0526 04/04/20 0506  NA 136   < > 137   < > 132* 131* 130*  K 4.3   < > 3.9   < > 5.1 5.1 5.1  CL 100   < > 101   < > 94* 94* 96*  CO2 26   < > 27   < > 24 24 23   GLUCOSE 180*   < > 123*   < > 195* 214* 226*  BUN 23   < > 22   < > 31* 45* 55*  CREATININE 1.37*   < > 1.38*   < > 1.67* 2.09* 2.30*  CALCIUM 8.9   < > 8.9   < > 9.5 9.1 9.1  MG 1.8  --  1.8  --  2.2  --   --   PHOS  --   --   --   --  4.6  --   --    < > = values in this interval not displayed.     Liver Function Tests: Recent Labs  Lab 03/30/20 1339  AST 53*  ALT 22  ALKPHOS 140*  BILITOT 1.2  PROT 7.5  ALBUMIN 3.4*   No results for input(s): LIPASE, AMYLASE in the last 168 hours. No results for input(s): AMMONIA in the last 168 hours.  CBC: Recent Labs  Lab 03/30/20 1339 03/30/20 1339 03/31/20 0442 04/04/20 0506  WBC 7.5   < > 7.8 11.0*  NEUTROABS 5.7  --   --   --   HGB 8.3*   < > 8.1* 8.6*  HCT 27.1*   < > 25.1* 25.4*  MCV 83.6   < > 80.7 76.3*  PLT 236   < > 229 294   < > = values in this interval not displayed.    Cardiac Enzymes: No results for input(s): CKTOTAL, TROPONINI in the last 168 hours.  BNP: Invalid input(s): POCBNP  CBG: Recent Labs  Lab 04/03/20 1216 04/03/20 1722 04/03/20 2106 04/04/20 0837 04/04/20 1111  GLUCAP 211* 248* 284* 237* 222*    Microbiology: Recent Results (from the past 720 hour(s))  SARS Coronavirus 2 by RT PCR (hospital order, performed in [facility] hospital lab) Nasopharyngeal Nasopharyngeal Swab     Status: None   Collection Time: 03/30/20  1:51 PM   Specimen: Nasopharyngeal Swab  Result Value Ref Range Status   SARS Coronavirus 2 NEGATIVE NEGATIVE Final    Comment: (NOTE) SARS-CoV-2 target nucleic acids are NOT DETECTED.  The SARS-CoV-2 RNA is generally detectable in upper and lower respiratory specimens during the acute phase of infection. The lowest concentration of SARS-CoV-2 viral copies this assay can detect is 250 copies / mL. A negative result does not preclude SARS-CoV-2 infection and should not be used as the sole basis for treatment or other patient management decisions.  A negative result may occur with improper specimen collection / handling, submission of specimen other than nasopharyngeal swab, presence of viral mutation(s) within the areas targeted by this assay, and inadequate number of viral copies (<250 copies / mL). A negative result must be combined with clinical observations,  patient history, and epidemiological information.  Fact Sheet for Patients:   StrictlyIdeas.no  Fact Sheet for Healthcare Providers: BankingDealers.co.za  This test is not yet approved or  cleared by the [place] FDA and has been authorized for detection and/or diagnosis of SARS-CoV-2 by FDA under an Emergency Use Authorization (EUA).  This EUA will remain in effect (meaning this test can be used) for the duration of the COVID-19 declaration under Section 564(b)(1) of the Act, 21 U.S.C. section 360bbb-3(b)(1), unless the authorization is terminated or revoked sooner.  Performed at [facility], [address] [place], [place] [address]   MRSA PCR Screening     Status: None   Collection Time: 03/30/20 10:23 PM   Specimen: Nasal Mucosa; Nasopharyngeal  Result Value Ref Range Status   MRSA by PCR NEGATIVE NEGATIVE Final    Comment:        The GeneXpert MRSA Assay (FDA approved for NASAL specimens only), is one component of a comprehensive MRSA colonization surveillance program. It is not intended to diagnose MRSA infection nor to guide or monitor treatment for MRSA infections. Performed at [facility]  [facility], [place], [place] [address]      Coagulation Studies: No results for input(s): LABPROT, INR in the last 72 hours.  Urinalysis: No results for input(s): COLORURINE, LABSPEC, PHURINE, GLUCOSEU, HGBUR, BILIRUBINUR, KETONESUR, PROTEINUR, UROBILINOGEN, NITRITE, LEUKOCYTESUR in the last 72 hours.  Invalid input(s): APPERANCEUR      Imaging:  No results found.   Assessment & Plan: Pt is a 59 y.o.   female with medical problems of HTN, HLD, DM, COPD, asthma, depression, OSA, ICD placement, diastolic CHF, h/o HOCM s/p myomectomy at [facility], prolonged hospitalization, CKD, A Fib , was admitted on 03/30/2020 with SOB (shortness of breath) [R06.02] Acute on chronic diastolic CHF (congestive  heart failure) ([place]) [I50.33]   #Acute renal failure #Generalized edema #Chronic kidney disease stage III B.  Baseline creatinine 1.36, GFR 42-49 April 01, 2020 #Gangrenous changes bilateral hands, left worse than right DM-2 with CKD # Grade 2 diastolic dysfunction  Lab Results  Component Value Date   HGBA1C 6.7 (H) 03/30/2020     Plan: Urinalysis Renal imaging-renal ultrasound IV Lasix infusion IV albumin infusion We will follow closely    LOS: 5 Marvell Stavola 8/20/202111:27 AM    Note: This note was prepared with Dragon dictation. Any transcription errors are unintentional

## 2020-04-04 NOTE — Consult Note (Signed)
Julie Bender [facility] Consult Note  MRN : [id number]  Julie Bender is a 59 y.o. ([birth year]) female who presents with chief complaint of  Chief Complaint  Patient presents with  . Shortness of Breath  .  History of Present Illness:  I am asked to evaluate the patient by Dr. Roger Shelter for gangrene.  Patient is a 59 year old female admitted to [facility] approximately 5 days ago with increasing shortness of breath.  She has a very complex history which includes multiple complications status post myectomy at [facility] in February of this year.  She also has chronic COPD and is on 3 L nasal cannula as a baseline.  She has chronic congestive heart failure atrial fibrillation CKD stage III.  On admission she is noted to have gangrenous changes to both hands as well as ulceration of the left foot.  I am asked to evaluate.  At the time of my evaluation the patient is lying in bed she denies pain of any extremities.  Current Facility-Administered Medications  Medication Dose Route Frequency Provider Last Rate Last Admin  . 0.9 %  sodium chloride infusion  250 mL Intravenous PRN Ivor Costa, MD      . acetaminophen (TYLENOL) tablet 650 mg  650 mg Oral Q4H PRN Ivor Costa, MD      . albuterol (PROVENTIL) (2.5 MG/3ML) 0.083% nebulizer solution 2.5 mg  2.5 mg Nebulization Q4H PRN Ivor Costa, MD      . amiodarone (PACERONE) tablet 200 mg  200 mg Oral Daily Ivor Costa, MD   200 mg at 04/03/20 1006  . apixaban (ELIQUIS) tablet 5 mg  5 mg Oral BID Ivor Costa, MD   5 mg at 04/03/20 2055  . aspirin EC tablet 81 mg  81 mg Oral Daily Ivor Costa, MD   81 mg at 04/03/20 1001  . atorvastatin (LIPITOR) tablet 20 mg  20 mg Oral Daily Ivor Costa, MD   20 mg at 04/03/20 1002  . dextromethorphan-guaiFENesin (MUCINEX DM) 30-600 MG per 12 hr tablet 1 tablet  1 tablet Oral BID PRN Ivor Costa, MD   1 tablet at 04/03/20 2054  . docusate sodium (COLACE) capsule 100 mg  100 mg Oral  BID Ivor Costa, MD   100 mg at 04/03/20 2054  . feeding supplement (NEPRO CARB STEADY) liquid 237 mL  237 mL Oral BID BM Shahmehdi, Seyed A, MD      . ferrous sulfate tablet 325 mg  325 mg Oral Q breakfast Ivor Costa, MD   325 mg at 04/03/20 1005  . gabapentin (NEURONTIN) capsule 600 mg  600 mg Oral QID Ivor Costa, MD   600 mg at 04/03/20 2054  . hydrALAZINE (APRESOLINE) injection 5 mg  5 mg Intravenous Q2H PRN Ivor Costa, MD      . insulin aspart (novoLOG) injection 0-5 Units  0-5 Units Subcutaneous QHS Kayleen Memos, DO   3 Units at 04/03/20 2220  . insulin aspart (novoLOG) injection 0-9 Units  0-9 Units Subcutaneous TID WC Irene Pap N, DO   3 Units at 04/03/20 1733  . insulin detemir (LEVEMIR) injection 15 Units  15 Units Subcutaneous QHS Deatra James, MD   15 Units at 04/03/20 2056  . ipratropium-albuterol (DUONEB) 0.5-2.5 (3) MG/3ML nebulizer solution 3 mL  3 mL Nebulization TID Irene Pap N, DO   3 mL at 04/04/20 0723  . methylPREDNISolone sodium succinate (SOLU-MEDROL) 125 mg/2 mL injection 60 mg  60 mg Intravenous Q24H  Deatra James, MD   60 mg at 04/04/20 0301  . metoprolol succinate (TOPROL-XL) 24 hr tablet 12.5 mg  12.5 mg Oral Daily Ivor Costa, MD   12.5 mg at 04/03/20 0959  . multivitamin (RENA-VIT) tablet 1 tablet  1 tablet Oral Daily Ivor Costa, MD   1 tablet at 04/03/20 1307  . multivitamin with minerals tablet 1 tablet  1 tablet Oral Daily Shahmehdi, Seyed A, MD   1 tablet at 04/03/20 1003  . mupirocin cream (BACTROBAN) 2 %   Topical BID Kayleen Memos, DO   Given at 04/03/20 2055  . ondansetron (ZOFRAN) injection 4 mg  4 mg Intravenous Q6H PRN Ivor Costa, MD      . oxyCODONE (Oxy IR/ROXICODONE) immediate release tablet 5 mg  5 mg Oral Q6H PRN Ivor Costa, MD   5 mg at 04/04/20 0302  . pantoprazole (PROTONIX) EC tablet 40 mg  40 mg Oral Daily Ivor Costa, MD   40 mg at 04/03/20 1006  . polyethylene glycol (MIRALAX / GLYCOLAX) packet 17 g  17 g Oral Daily Ivor Costa,  MD   17 g at 04/03/20 1007  . sertraline (ZOLOFT) tablet 100 mg  100 mg Oral Daily Ivor Costa, MD   100 mg at 04/03/20 1003  . sodium chloride flush (NS) 0.9 % injection 3 mL  3 mL Intravenous Q12H Ivor Costa, MD   3 mL at 04/03/20 2056  . sodium chloride flush (NS) 0.9 % injection 3 mL  3 mL Intravenous PRN Ivor Costa, MD      . torsemide (DEMADEX) tablet 20 mg  20 mg Oral Daily Shahmehdi, Seyed A, MD   20 mg at 04/03/20 1003  . vitamin B-12 (CYANOCOBALAMIN) tablet 1,000 mcg  1,000 mcg Oral Daily Ivor Costa, MD   1,000 mcg at 04/03/20 1011  . zinc sulfate capsule 220 mg  220 mg Oral Daily Ivor Costa, MD   220 mg at 04/03/20 1001    Past Medical History:  Diagnosis Date  . Acute on chronic respiratory failure with hypoxia ([place])   . Asthma   . CHF (congestive heart failure) ([place])   . Chronic heart failure with preserved ejection fraction ([place])   . Chronic obstructive asthma ([place])   . COPD (chronic obstructive pulmonary disease) ([place])   . Diabetes mellitus without complication ([place])   . Diabetic retinopathy ([place])   . End stage renal disease on dialysis ([place])   . History of placement of internal cardiac defibrillator   . Hypertension   . Hypertrophic cardiomyopathy ([place])   . Hypertrophic cardiomyopathy ([place])   . Morbid obesity ([place])   . Obstructive sleep apnea     Past Surgical History:  Procedure Laterality Date  . CARDIAC DEFIBRILLATOR PLACEMENT    . CHOLECYSTECTOMY    . RIGHT/LEFT HEART CATH AND CORONARY ANGIOGRAPHY N/A 06/19/2019   Procedure: RIGHT/LEFT HEART CATH AND CORONARY ANGIOGRAPHY;  Surgeon: Nelva Bush, MD;  Location: [place] [facility];  Service: Cardiovascular;  Laterality: N/A;    Social History Social History   Tobacco Use  . Smoking status: Passive Smoke Exposure - Never Smoker  . Smokeless tobacco: Never Used  Vaping Use  . Vaping Use: Never used  Substance Use Topics  . Alcohol use: No  . Drug use: Never    Family History Family History   Problem Relation Age of Onset  . Hypertrophic cardiomyopathy Sister   No family history of bleeding/clotting disorders, porphyria or autoimmune disease   No  Known Allergies   REVIEW OF SYSTEMS (Negative unless checked)  Constitutional: [] Weight loss  [] Fever  [] Chills Cardiac: [] Chest pain   [] Chest pressure   [x] Palpitations   [x] Shortness of breath when laying flat   [x] Shortness of breath at rest   [x] Shortness of breath with exertion. Vascular:  [] Pain in legs with walking   [] Pain in legs at rest   [] Pain in legs when laying flat   [] Claudication   [] Pain in feet when walking  [] Pain in feet at rest  [] Pain in feet when laying flat   [] History of DVT   [] Phlebitis   [x] Swelling in legs   [] Varicose veins   [] Non-healing ulcers Pulmonary:   [] Uses home oxygen   [] Productive cough   [] Hemoptysis   [] Wheeze  [x] COPD   [x] Asthma Neurologic:  [] Dizziness  [] Blackouts   [] Seizures   [] History of stroke   [] History of TIA  [] Aphasia   [] Temporary blindness   [] Dysphagia   [x] Weakness or numbness in arms   [x] Weakness or numbness in legs Musculoskeletal:  [] Arthritis   [] Joint swelling   [] Joint pain   [] Low back pain Hematologic:  [] Easy bruising  [] Easy bleeding   [] Hypercoagulable state   [] Anemic  [] Hepatitis Gastrointestinal:  [] Blood in stool   [] Vomiting blood  [] Gastroesophageal reflux/heartburn   [] Difficulty swallowing. Genitourinary:  [x] Chronic kidney disease   [] Difficult urination  [] Frequent urination  [] Burning with urination   [] Blood in urine Skin:  [] Rashes   [] Ulcers   [] Wounds Psychological:  [] History of anxiety   []  History of major depression.    Physical Examination  Vitals:   04/03/20 1721 04/03/20 1947 04/04/20 0300 04/04/20 0723  BP: 139/87 (!) 125/41 138/84   Pulse: 74 73 75   Resp:  18    Temp: 98 F (36.7 C) (!) 97.4 F (36.3 C) 97.6 F (36.4 C)   TempSrc: Oral Oral Oral   SpO2: 100% 100% 98% 98%  Weight:   102.9 kg   Height:       Body mass index  is 38.92 kg/m.  Head: [place]/AT, No temporalis wasting. Prominent temp pulse not noted. Ear/Nose/Throat: Nares w/o erythema or drainage, oropharynx w/o obsrtuction.  Eyes: PER, Sclera nonicteric.  Neck: Relatively immobile JVD.  Pulmonary:  Breath sounds audible from the hall severe wheeze mild use of accessory muscles.  Cardiac: Irregularly irregular median sternotomy scar noted. Vascular: There is 4+ edema of the lower extremities and 2+ edema of the arms and dorsums of the hands.  There is nonpalpable radial dorsalis pedis posterior tibial and popliteal pulses.  There is dry gangrene of the thumb and index finger on the right and of the index finger third fourth and fifth fingers on the left.  There are bilateral TMA's right is well-healed left demonstrates ulcerated area on the dorsum that is noninfected Gastrointestinal: soft, non-tender, non-distended.  Musculoskeletal: She moves her right hand and forearm somewhat she does not move her left arm or either leg.  Deformities as noted above. +_ edema. Neurologic: CN 2-12 intact. Symmetrical.  Speech is fluent.  Psychiatric: Judgment intact, Mood & affect appropriate for pt's clinical situation.       CBC Lab Results  Component Value Date   WBC 11.0 (H) 04/04/2020   HGB 8.6 (L) 04/04/2020   HCT 25.4 (L) 04/04/2020   MCV 76.3 (L) 04/04/2020   PLT 294 04/04/2020    BMET    Component Value Date/Time   NA 130 (L) 04/04/2020 0506   NA 138  06/14/2019 1159   K 5.1 04/04/2020 0506   CL 96 (L) 04/04/2020 0506   CO2 23 04/04/2020 0506   GLUCOSE 226 (H) 04/04/2020 0506   BUN 55 (H) 04/04/2020 0506   BUN 20 06/14/2019 1159   CREATININE 2.30 (H) 04/04/2020 0506   CALCIUM 9.1 04/04/2020 0506   GFRNONAA 22 (L) 04/04/2020 0506   GFRAA 26 (L) 04/04/2020 0506   Estimated Creatinine Clearance: 30 mL/min (A) (by C-G formula based on SCr of 2.3 mg/dL (H)).  COAG Lab Results  Component Value Date   INR 0.99 04/17/2018   INR 1.10 09/05/2016    INR 1.00 09/04/2016    Assessment/Plan 1.  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  Her fingers demonstrate dry gangrenous changes but there is no evidence of cellulitis.  It appears [facility] has opted for a conservative treatment plan which I would endorse.  With respect to the lower extremities the right TMA is well-healed left TMA does demonstrate an ulcerated area but I would continue to treat this locally with dressing changes.  Overall she is an extremely poor candidate for any intervention including minimally invasive techniques.  Her profoundly compromised respiratory system poor cardiac status severe kidney dysfunction and overall anasarca make any attempts and intervention high risk and unfortunately unlikely to succeed.  Given the current situation she is stable and I would not recommend any further evaluation.  I was considering a CT angiogram as a minimally invasive way to assess her vascular status unfortunately her renal dysfunction precludes this as of yet another indication that conservative therapy will be her best option.  Hortencia Pilar, MD  04/04/2020 7:56 AM

## 2020-04-04 NOTE — Progress Notes (Signed)
PROGRESS NOTE  Julie Bender LOV:[id number] DOB: April 20, 1961 DOA: 03/30/2020 PCP: Deanne Coffer, MD  HPI/Recap of past 24 hours: HPI: Julie Bender is a 59 y.o. 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  ED Course: BNP 1811, troponin 50, COVID-19 PCR negative, RR 25, oxygen sat 93% on 3 L oxygen currently, chest x-ray showed cardiomegaly and vascular congestion.       Subjective:  The patient was seen and examined this morning, still on O2 supplement, has been trended down from 4 to 3 L of oxygen, currently satting 95%. Still audibly wheezing.  Patient was seen and evaluated by vascular surgeon who recommended no intervention at this time Patient was informed of worsening creatinine, agreed to consult nephrology  ---------------------------------------------------------------------------------------------------------------------------------------------  Assessment/Plan: Principal Problem:   Acute on chronic diastolic CHF (congestive heart failure) ([place]) Active Problems:   Depression   Elevated troponin   Acute on chronic respiratory failure with hypoxia (HCC)   COPD (chronic obstructive pulmonary disease) ([place])   Hypertension   Type II diabetes mellitus with renal manifestations ([place])   CKD (chronic kidney disease), stage IIIa   Atrial fibrillation, chronic (HCC)   Acute on chronic respiratory failure with hypoxia  secondary to pulmonary edema in the setting of acute on chronic diastolic CHF (congestive heart failure) ([place]):   -Remained stable, O2 demand has reduced from 4  L to 3 L of oxygen via nasal cannula, currently satting 95%  -Reducing-withholding diuretics as creatinine started to increase Remains oliguric -nephrology reconsulted   2D echo on 05/21/2019 showed EF > 75% patient has a bilateral 2+ leg edema, crackles on auscultation, elevated BNP 1811, chest x-ray showed cardiomegaly and vascular congestion, suggestive of pulmonary edema Ongoing diuresing with IV Lasix 40 mg twice daily >>> holding due to elevated creatinine  We will taper O2 demand down to maintain O2 sat of 88-92%  Acute on chronic diastolic CHF Remained stable on 4 L of oxygen Presented with volume overload, elevated BNP, pulmonary edema on chest x-ray Continue current cardiac medications 2D echo reviewed Continue strict I's and O's and daily weight Net I&O -110 cc  Elevated troponin suspect demand ischemia in the setting of acute on chronic hypoxia:  -stable -- no chest pain  Troponin S peaked at 53 and trended down. ..  -Aspirin, Lipitor, metoprolol -As needed nitroglycerin Dual paced rhythm on EKG  QTC prolongation Stable  Twelve-lead EKG, QTC greater than 500 Avoid QTC prolonging agents  Depression Continue home regimen Remained stable  COPD (chronic obstructive pulmonary disease) ([place]):  Continue home regimen and oxygen supplementation to maintain O2 saturation greater than 90% Currently on 4 L of oxygen, satting 100% >> down to 3 L of oxygen, satting 95% -On Solu-Medrol IV tapering down  Hypertension Blood pressure is stable and at goal -IV hydralazine as needed -Patient is on IV Lasix  >>> holding at this time, consented to change to p.o. -Metoprolol  HLD: -Lipitor  Type II diabetes mellitus with renal manifestations and hyperglycemia ([place]): Hemoglobin A1c 6.7 on 03/30/2020  -Sliding scale insulin and Levemir  CKD-IIIB: -Worsening creatinine 1.67, 2.09, 2.30, Currently at her baseline creatinine 1.3 with GFR 48 Avoid nephrotoxins -Consulted  nephrology  Dr. Candiss Norse for further evaluation recommendation And assisting with diuresis  Paroxysmal A fib: Rate controlled on metoprolol On amiodarone for rhythm control On Eliquis for CVA prevention  Bilateral transmetatarsal amputation -dry gangrene With formed gangrene on left hand and fingers  Vasculare surg. Consulted --- recommended no intervention at this time With concerning CT angiogram which cannot be completed due to worsening kidney function  DVT ppx: Eliquis Code Status: Full code Family Communication: None at bedside.  Consults called:   Nephrology, vascular surgery,   Status is: Inpatient    Dispo:  Patient From: [place]  Planned Disposition: [place]  Expected discharge date: 04/04/2020  Medically stable for discharge: No, due to ongoing management of acute on chronic diastolic CHF, ongoing diuresing.     Objective: Vitals:   04/03/20 1947 04/04/20 0300 04/04/20 0723 04/04/20 0839  BP: (!) 125/41 138/84  (!) 146/58  Pulse: 73 75  74  Resp: 18     Temp: (!) 97.4 F (36.3 C) 97.6 F (36.4 C)  97.9 F (36.6 C)  TempSrc: Oral Oral    SpO2: 100% 98% 98% 96%  Weight:  102.9 kg    Height:       No intake or output data in the 24 hours ending 04/04/20 1050 Filed Weights   04/02/20 0500 04/03/20 0440 04/04/20 0300  Weight: 98.6 kg 102.2 kg 102.9 kg         Physical Exam:   General:  Alert, oriented, cooperative, no distress;   HEENT:  Normocephalic, PERRL, otherwise with in Normal limits   Neuro:  CNII-XII intact. , normal motor and sensation, reflexes intact   Lungs:   Clear to auscultation BL, Respirations unlabored, no wheezes / crackles  Cardio:    S1/S2, RRR, No murmure, No Rubs or Gallops   Abdomen:   Soft, non-tender, bowel sounds active all four quadrants,  no guarding or peritoneal signs.  Muscular skeletal:  Limited exam - in bed, able to move all 4 extremities, Normal strength,  2+ pulses,   symmetric, No pitting edema  Skin:  Dry, warm to touch, negative for any Rashes, No open wounds  Wounds: Please see nursing documentation    lower ext. Amput. Gangrene fingers. No changes              Data Reviewed: CBC: Recent Labs  Lab 03/30/20 1339 03/31/20 0442 04/04/20 0506  WBC 7.5 7.8 11.0*  NEUTROABS 5.7  --   --   HGB 8.3* 8.1* 8.6*  HCT 27.1* 25.1* 25.4*  MCV 83.6 80.7 76.3*  PLT 236 229 188   Basic Metabolic Panel: Recent Labs  Lab 03/30/20 1339 03/30/20 1339 03/31/20 0442 04/01/20 0414 04/02/20 0629 04/03/20 0526 04/04/20 0506  NA 136   < > 137 137 132* 131* 130*  K 4.3   < > 3.9 3.9 5.1 5.1 5.1  CL 100   < > 101 97* 94* 94* 96*  CO2 26   < > 27 28 24 24 23   GLUCOSE 180*   < > 123* 54* 195* 214* 226*  BUN 23   < > 22 23 31* 45* 55*  CREATININE 1.37*   < > 1.38* 1.36* 1.67* 2.09* 2.30*  CALCIUM 8.9   < > 8.9 8.9 9.5 9.1 9.1  MG 1.8  --  1.8  --  2.2  --   --   PHOS  --   --   --   --  4.6  --   --    < > =  values in this interval not displayed.   GFR: Estimated Creatinine Clearance: 30 mL/min (A) (by C-G formula based on SCr of 2.3 mg/dL (H)). Liver Function Tests: Recent Labs  Lab 03/30/20 1339  AST 53*  ALT 22  ALKPHOS 140*  BILITOT 1.2  PROT 7.5  ALBUMIN 3.4*    No results for input(s): HGBA1C in the last 72 hours. CBG: Recent Labs  Lab 04/03/20 0751 04/03/20 1216 04/03/20 1722 04/03/20 2106 04/04/20 0837  GLUCAP 210* 211* 248* 284* 237*   Lipid Profile: No results for input(s): CHOL, HDL, LDLCALC, TRIG, CHOLHDL, LDLDIRECT in the last 72 hours. Thyroid Function Tests: No results for input(s): TSH, T4TOTAL, FREET4, T3FREE, THYROIDAB in the last 72 hours. Anemia Panel: No results for input(s): VITAMINB12, FOLATE, FERRITIN, TIBC, IRON, RETICCTPCT in the last 72 hours. Urine analysis:    Component Value Date/Time   COLORURINE AMBER (A) 02/23/2019 1226   APPEARANCEUR CLOUDY (A) 02/23/2019 1226   LABSPEC 1.033 (H) 02/23/2019  1226   PHURINE 5.0 02/23/2019 1226   GLUCOSEU NEGATIVE 02/23/2019 1226   HGBUR NEGATIVE 02/23/2019 1226   BILIRUBINUR SMALL (A) 02/23/2019 1226   KETONESUR 5 (A) 02/23/2019 1226   PROTEINUR 30 (A) 02/23/2019 1226   NITRITE NEGATIVE 02/23/2019 1226   LEUKOCYTESUR NEGATIVE 02/23/2019 1226   Sepsis Labs: @LABRCNTIP (procalcitonin:4,lacticidven:4)  ) Recent Results (from the past 240 hour(s))  SARS Coronavirus 2 by RT PCR (hospital order, performed in [place] hospital lab) Nasopharyngeal Nasopharyngeal Swab     Status: None   Collection Time: 03/30/20  1:51 PM   Specimen: Nasopharyngeal Swab  Result Value Ref Range Status   SARS Coronavirus 2 NEGATIVE NEGATIVE Final    Comment: (NOTE) SARS-CoV-2 target nucleic acids are NOT DETECTED.  The SARS-CoV-2 RNA is generally detectable in upper and lower respiratory specimens during the acute phase of infection. The lowest concentration of SARS-CoV-2 viral copies this assay can detect is 250 copies / mL. A negative result does not preclude SARS-CoV-2 infection and should not be used as the sole basis for treatment or other patient management decisions.  A negative result may occur with improper specimen collection / handling, submission of specimen other than nasopharyngeal swab, presence of viral mutation(s) within the areas targeted by this assay, and inadequate number of viral copies (<250 copies / mL). A negative result must be combined with clinical observations, patient history, and epidemiological information.  Fact Sheet for Patients:   StrictlyIdeas.no  Fact Sheet for Healthcare Providers: BankingDealers.co.za  This test is not yet approved or  cleared by the [place] FDA and has been authorized for detection and/or diagnosis of SARS-CoV-2 by FDA under an Emergency Use Authorization (EUA).  This EUA will remain in effect (meaning this test can be used) for the duration of  the COVID-19 declaration under Section 564(b)(1) of the Act, 21 U.S.C. section 360bbb-3(b)(1), unless the authorization is terminated or revoked sooner.  Performed at [facility], [address] [place], [place] [address]   MRSA PCR Screening     Status: None   Collection Time: 03/30/20 10:23 PM   Specimen: Nasal Mucosa; Nasopharyngeal  Result Value Ref Range Status   MRSA by PCR NEGATIVE NEGATIVE Final    Comment:        The GeneXpert MRSA Assay (FDA approved for NASAL specimens only), is one component of a comprehensive MRSA colonization surveillance program. It is not intended to diagnose MRSA infection nor to guide or monitor treatment for MRSA infections. Performed at [facility], [address], [place], [place] [address]       Studies: No results found.  Scheduled Meds: . amiodarone  200 mg Oral Daily  . apixaban  5 mg Oral BID  . aspirin EC  81 mg Oral Daily  . atorvastatin  20 mg Oral Daily  . docusate sodium  100 mg Oral BID  . feeding supplement (NEPRO CARB STEADY)  237 mL Oral BID BM  . ferrous sulfate  325 mg Oral Q breakfast  . gabapentin  300 mg Oral QID  . insulin aspart  0-5 Units Subcutaneous QHS  . insulin aspart  0-9 Units Subcutaneous TID WC  . insulin detemir  15 Units Subcutaneous QHS  . ipratropium-albuterol  3 mL Nebulization TID  . methylPREDNISolone (SOLU-MEDROL) injection  60 mg Intravenous Q24H  . metoprolol succinate  12.5 mg Oral Daily  . multivitamin  1 tablet Oral Daily  . multivitamin with minerals  1 tablet Oral Daily  . mupirocin cream   Topical BID  . pantoprazole  40 mg Oral Daily  . polyethylene glycol  17 g Oral Daily  . sertraline  100 mg Oral Daily  . sodium chloride flush  3 mL Intravenous Q12H  . cyanocobalamin  1,000 mcg Oral Daily  . zinc sulfate  220 mg Oral Daily    Continuous Infusions: . sodium chloride       LOS: 5 days     Deatra James, MD [facility] Pager  [phone]  If 7PM-7AM, please contact night-coverage [email or web address] Password [email or web address] 04/04/2020, 10:50 AM

## 2020-04-05 LAB — GLUCOSE, CAPILLARY
Glucose-Capillary: 207 mg/dL — ABNORMAL HIGH (ref 70–99)
Glucose-Capillary: 301 mg/dL — ABNORMAL HIGH (ref 70–99)
Glucose-Capillary: 336 mg/dL — ABNORMAL HIGH (ref 70–99)
Glucose-Capillary: 303 mg/dL — ABNORMAL HIGH (ref 70–99)

## 2020-04-05 LAB — BASIC METABOLIC PANEL
Anion gap: 12 (ref 5–15)
BUN: 60 mg/dL — ABNORMAL HIGH (ref 8–23)
CO2: 26 mmol/L (ref 22–32)
Calcium: 9.3 mg/dL (ref 8.9–10.3)
Chloride: 95 mmol/L — ABNORMAL LOW (ref 98–111)
Creatinine, Ser: 2.31 mg/dL — ABNORMAL HIGH (ref 0.44–1.00)
GFR calc Af Amer: 26 mL/min — ABNORMAL LOW (ref >60)
GFR calc non Af Amer: 22 mL/min — ABNORMAL LOW (ref >60)
Glucose, Bld: 215 mg/dL — ABNORMAL HIGH (ref 70–99)
Potassium: 5.8 mmol/L — ABNORMAL HIGH (ref 3.5–5.1)
Sodium: 133 mmol/L — ABNORMAL LOW (ref 135–145)

## 2020-04-05 LAB — CBC
HCT: 23.9 % — ABNORMAL LOW (ref 36.0–46.0)
Hemoglobin: 8 g/dL — ABNORMAL LOW (ref 12.0–15.0)
MCH: 25.7 pg — ABNORMAL LOW (ref 26.0–34.0)
MCHC: 33.5 g/dL (ref 30.0–36.0)
MCV: 76.8 fL — ABNORMAL LOW (ref 80.0–100.0)
Platelets: 231 10*3/uL (ref 150–400)
RBC: 3.11 MIL/uL — ABNORMAL LOW (ref 3.87–5.11)
RDW: 21.7 % — ABNORMAL HIGH (ref 11.5–15.5)
WBC: 7.2 10*3/uL (ref 4.0–10.5)
nRBC: 0.8 % — ABNORMAL HIGH (ref 0.0–0.2)

## 2020-04-05 LAB — ALBUMIN: Albumin: 3.8 g/dL (ref 3.5–5.0)

## 2020-04-05 MED ORDER — SODIUM POLYSTYRENE SULFONATE 15 GM/60ML PO SUSP
30.0000 g | Freq: Once | ORAL | Status: AC
  Administered 2020-04-05: 30 g via ORAL
  Filled 2020-04-05: qty 120

## 2020-04-05 MED ORDER — PATIROMER SORBITEX CALCIUM 8.4 G PO PACK
16.8000 g | PACK | Freq: Every day | ORAL | Status: DC
  Administered 2020-04-05: 16.8 g via ORAL
  Filled 2020-04-05 (×2): qty 2

## 2020-04-05 MED ORDER — METHYLPREDNISOLONE SODIUM SUCC 40 MG IJ SOLR
40.0000 mg | INTRAMUSCULAR | Status: DC
  Administered 2020-04-06 – 2020-04-07 (×2): 40 mg via INTRAVENOUS
  Filled 2020-04-05 (×2): qty 1

## 2020-04-05 NOTE — Progress Notes (Signed)
PROGRESS NOTE  Julie Bender XLK:[id number] DOB: 12-19-1960 DOA: 03/30/2020 PCP: Deanne Coffer, MD  HPI/Recap of past 24 hours: HPI: Julie Bender is a 59 y.o. 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  ED Course: BNP 1811, troponin 50, COVID-19 PCR negative, RR 25, oxygen sat 93% on 3 L oxygen currently, chest x-ray showed cardiomegaly and vascular congestion.       Subjective:  The patient was seen and examined this morning, stable reporting improved shortness of breath. Stating she has not ambulated been bedbound since February. Stating prior to her hospitalization this year she was not O2 dependent at home Currently satting greater 90% on 4 L of oxygen --at nursing facility was on 3 L of oxygen  Due to elevated creatinine, continue having anasarca--patient requiring IV Lasix drip Nephrology following very closely since yesterday.  ---------------------------------------------------------------------------------------------------------------------------------------------  Assessment/Plan: Principal Problem:   Acute on chronic diastolic CHF (congestive heart failure) (HCC) Active Problems:   Depression   Elevated troponin   Acute on chronic respiratory failure with hypoxia (HCC)   COPD (chronic obstructive pulmonary disease) ([place])   Hypertension   Type II diabetes mellitus with renal manifestations (HCC)   CKD (chronic kidney disease), stage IIIa   Atrial fibrillation, chronic (HCC)   Acute on chronic respiratory failure with hypoxia   secondary to pulmonary Edema in  the setting of acute on chronic diastolic CHF (congestive heart failure) (HCC) -anasarca  -Shortness of breath at rest improved -significantly worsens with any exertion  Remains oliguric -nephrology reconsulted   2D echo on 05/21/2019 showed EF > 75% patient has a bilateral 2+ leg edema, crackles on auscultation, elevated BNP 1811, chest x-ray showed cardiomegaly and vascular congestion, suggestive of pulmonary edema  Nephrology consulted, patient is now started on Lasix drip >. Monitoring I's and O's, daily weights very closely Monitor creatinine function   We will taper O2 demand down to maintain O2 sat of 88-92% Baseline O2 demand 3 L, currently on 4 L, satting 100%  Acute on chronic diastolic CHF -Management as above Presented with volume overload, elevated BNP, pulmonary edema on chest x-ray Continue current cardiac medications 2D echo reviewed Net I&O -1400  Elevated troponin  suspect demand ischemia in the setting of acute on chronic hypoxia:  -stable -- no chest pain  Troponin S peaked at 53 and trended down. ..  -Aspirin, Lipitor, metoprolol -As needed nitroglycerin Dual paced rhythm on EKG  QTC prolongation/history of P A. fib Stable  Twelve-lead EKG, QTC greater than 500 Avoid QTC prolonging agents -Continue metoprolol, amiodarone, Eliquis  Depression Continue home regimen Remained stable  COPD (chronic obstructive pulmonary disease) ([place]):  Continue home regimen and oxygen supplementation to maintain O2 saturation greater than 90% Currently on 4 L of oxygen, satting 100% >> down to 3 L of oxygen, satting 95% -On Solu-Medrol IV tapering down  Hypertension Blood pressure is stable and at goal -IV hydralazine as needed -Patient is on IV Lasix  >>> holding at this time, consented to change to p.o. -Metoprolol  HLD: -Lipitor  Type II diabetes mellitus with renal manifestations and hyperglycemia ([place]): Hemoglobin A1c 6.7 on 03/30/2020  -Sliding scale  insulin and Levemir  CKD-IIIB: -  Worsening creatinine 1.67, 2.09, 2.30 >>> 2.31 today Currently at her baseline creatinine 1.3 with GFR 48 Avoid nephrotoxins -Consulted nephrology Dr. 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 Consulted --- recommended no intervention at this time With concerning CT angiogram which cannot be completed due to worsening kidney function  DVT ppx: Eliquis Code Status: Full code Family Communication: None at bedside.  Consults called:   Nephrology, vascular surgery,   Status is: Inpatient    Dispo:  Patient From: [place]  Planned Disposition: [place]  Expected discharge date: 04/04/2020  Medically stable for discharge: No, due to ongoing management of acute on chronic diastolic CHF, ongoing diuresing.     Objective: Vitals:   04/04/20 2035 04/05/20 0450 04/05/20 0747 04/05/20 1145  BP:  94/64 (!) 115/92 128/70  Pulse:  74 75 73  Resp:  19 19 18   Temp:  (!) 97.5 F (36.4 C) 98.1 F (36.7 C) 98.2 F (36.8 C)  TempSrc:  Oral Oral Oral  SpO2: 95% 100% 95% 100%  Weight:  101.8 kg    Height:        Intake/Output Summary (Last 24 hours) at 04/05/2020 1243 Last data filed at 04/05/2020 0900 Gross per 24 hour  Intake 240 ml  Output 1400 ml  Net -1160 ml   Filed Weights   04/03/20 0440 04/04/20 0300 04/05/20 0450  Weight: 102.2 kg 102.9 kg 101.8 kg       Physical Exam:   General:  Alert, oriented, cooperative, no distress;   HEENT:  Normocephalic, PERRL, otherwise with in Normal limits   Neuro:  CNII-XII intact. , normal motor and sensation, reflexes intact   Lungs:   Clear to auscultation BL, Respirations unlabored, no wheezes / crackles  Cardio:    S1/S2, RRR, No murmure, No Rubs or Gallops   Abdomen:   Soft, non-tender, bowel sounds active all  four quadrants,  no guarding or peritoneal signs.  Muscular skeletal:   Severe generalized weaknesses -currently bedbound patient reports no ambulation since February 2021  Limited exam - in bed, able to move all 4 extremities, distal phalanges eschar, dry gangrene, amputated lower extremity toes bilaterally 2+ pulses,  symmetric, ++2  pitting edema  Skin:  Dry, warm to touch, negative for any Rashes, left hand fingers dry gangrene   Wounds: Please see nursing documentation    lower ext. Amput. Gangrene fingers. No changes              Data Reviewed: CBC: Recent Labs  Lab 03/30/20 1339 03/31/20 0442 04/04/20 0506 04/05/20 0546  WBC 7.5 7.8 11.0* 7.2  NEUTROABS 5.7  --   --   --   HGB 8.3* 8.1* 8.6* 8.0*  HCT 27.1* 25.1* 25.4* 23.9*  MCV 83.6 80.7 76.3* 76.8*  PLT 236 229 294 546   Basic Metabolic Panel: Recent Labs  Lab 03/30/20 1339 03/30/20 1339 03/31/20 0442 03/31/20 0442 04/01/20 0414 04/02/20 0629 04/03/20 0526 04/04/20 0506 04/05/20 0546  NA 136   < > 137   < > 137 132* 131* 130* 133*  K 4.3   < > 3.9   < > 3.9 5.1 5.1 5.1 5.8*  CL 100   < > 101   < > 97* 94* 94* 96* 95*  CO2 26   < > 27   < > 28 24 24 23  26  GLUCOSE 180*   < > 123*   < > 54* 195* 214* 226* 215*  BUN 23   < > 22   < > 23 31* 45* 55* 60*  CREATININE 1.37*   < > 1.38*   < > 1.36* 1.67* 2.09* 2.30* 2.31*  CALCIUM 8.9   < > 8.9   < > 8.9 9.5 9.1 9.1 9.3  MG 1.8  --  1.8  --   --  2.2  --   --   --   PHOS  --   --   --   --   --  4.6  --   --   --    < > = values in this interval not displayed.   GFR: Estimated Creatinine Clearance: 29.7 mL/min (A) (by C-G formula based on SCr of 2.31 mg/dL (H)). Liver Function Tests: Recent Labs  Lab 03/30/20 1339 04/05/20 0546  AST 53*  --   ALT 22  --   ALKPHOS 140*  --   BILITOT 1.2  --   PROT 7.5  --   ALBUMIN 3.4* 3.8    No results for input(s): HGBA1C in the last 72 hours. CBG: Recent Labs  Lab 04/04/20 0837 04/04/20 1111  04/04/20 2233 04/05/20 0746 04/05/20 1146  GLUCAP 237* 222* 287* 207* 301*   Lipid Profile: No results for input(s): CHOL, HDL, LDLCALC, TRIG, CHOLHDL, LDLDIRECT in the last 72 hours. Thyroid Function Tests: No results for input(s): TSH, T4TOTAL, FREET4, T3FREE, THYROIDAB in the last 72 hours. Anemia Panel: No results for input(s): VITAMINB12, FOLATE, FERRITIN, TIBC, IRON, RETICCTPCT in the last 72 hours. Urine analysis:    Component Value Date/Time   COLORURINE YELLOW (A) 04/04/2020 2140   APPEARANCEUR HAZY (A) 04/04/2020 2140   LABSPEC 1.008 04/04/2020 2140   PHURINE 5.0 04/04/2020 2140   GLUCOSEU NEGATIVE 04/04/2020 2140   HGBUR SMALL (A) 04/04/2020 2140   BILIRUBINUR NEGATIVE 04/04/2020 2140   KETONESUR NEGATIVE 04/04/2020 2140   PROTEINUR NEGATIVE 04/04/2020 2140   NITRITE NEGATIVE 04/04/2020 2140   LEUKOCYTESUR TRACE (A) 04/04/2020 2140   Sepsis Labs: @LABRCNTIP (procalcitonin:4,lacticidven:4)  ) Recent Results (from the past 240 hour(s))  SARS Coronavirus 2 by RT PCR (hospital order, performed in [place] hospital lab) Nasopharyngeal Nasopharyngeal Swab     Status: None   Collection Time: 03/30/20  1:51 PM   Specimen: Nasopharyngeal Swab  Result Value Ref Range Status   SARS Coronavirus 2 NEGATIVE NEGATIVE Final    Comment: (NOTE) SARS-CoV-2 target nucleic acids are NOT DETECTED.  The SARS-CoV-2 RNA is generally detectable in upper and lower respiratory specimens during the acute phase of infection. The lowest concentration of SARS-CoV-2 viral copies this assay can detect is 250 copies / mL. A negative result does not preclude SARS-CoV-2 infection and should not be used as the sole basis for treatment or other patient management decisions.  A negative result may occur with improper specimen collection / handling, submission of specimen other than nasopharyngeal swab, presence of viral mutation(s) within the areas targeted by this assay, and inadequate number  of viral copies (<250 copies / mL). A negative result must be combined with clinical observations, patient history, and epidemiological information.  Fact Sheet for Patients:   StrictlyIdeas.no  Fact Sheet for Healthcare Providers: BankingDealers.co.za  This test is not yet approved or  cleared by the [place] FDA and has been authorized for detection and/or diagnosis of SARS-CoV-2 by FDA under an Emergency Use Authorization (EUA).  This  EUA will remain in effect (meaning this test can be used) for the duration of the COVID-19 declaration under Section 564(b)(1) of the Act, 21 U.S.C. section 360bbb-3(b)(1), unless the authorization is terminated or revoked sooner.  Performed at [facility], [address] [place], [place] [address]   MRSA PCR Screening     Status: None   Collection Time: 03/30/20 10:23 PM   Specimen: Nasal Mucosa; Nasopharyngeal  Result Value Ref Range Status   MRSA by PCR NEGATIVE NEGATIVE Final    Comment:        The GeneXpert MRSA Assay (FDA approved for NASAL specimens only), is one component of a comprehensive MRSA colonization surveillance program. It is not intended to diagnose MRSA infection nor to guide or monitor treatment for MRSA infections. Performed at [facility], [address] [place], [place] [address]       Studies: No results found.  Scheduled Meds: . amiodarone  200 mg Oral Daily  . apixaban  5 mg Oral BID  . aspirin EC  81 mg Oral Daily  . atorvastatin  20 mg Oral Daily  . docusate sodium  100 mg Oral BID  . feeding supplement (NEPRO CARB STEADY)  237 mL Oral BID BM  . ferrous sulfate  325 mg Oral Q breakfast  . gabapentin  300 mg Oral QID  . insulin aspart  0-5 Units Subcutaneous QHS  . insulin aspart  0-9 Units Subcutaneous TID WC  . insulin detemir  20 Units Subcutaneous QHS  . ipratropium-albuterol  3 mL Nebulization TID  . [START ON 04/06/2020]  methylPREDNISolone (SOLU-MEDROL) injection  40 mg Intravenous Q24H  . metoprolol succinate  12.5 mg Oral Daily  . multivitamin  1 tablet Oral Daily  . multivitamin with minerals  1 tablet Oral Daily  . mupirocin cream   Topical BID  . pantoprazole  40 mg Oral Daily  . patiromer  16.8 g Oral Daily  . polyethylene glycol  17 g Oral Daily  . sertraline  100 mg Oral Daily  . sodium chloride flush  3 mL Intravenous Q12H  . sodium polystyrene  30 g Oral Once  . cyanocobalamin  1,000 mcg Oral Daily  . zinc sulfate  220 mg Oral Daily    Continuous Infusions: . sodium chloride    . albumin human 12.5 g (04/05/20 1045)  . furosemide (LASIX) infusion 5 mg/hr (04/04/20 1413)     LOS: 6 days     Deatra James, MD [facility] Pager [phone]  If 7PM-7AM, please contact night-coverage [email or web address] Password [facility] 04/05/2020, 12:43 PM

## 2020-04-05 NOTE — Progress Notes (Signed)
[address] [place], [place] [address] Phone [phone]. Fax [phone]  Date: 04/05/2020                  Patient Name:  Julie Bender  MRN: [id number]  DOB: October 23, 1960  Age / Sex: 59 y.o., female         PCP: Deanne Coffer, MD                 Service Requesting Consult: IM/ Deatra James, MD                 Reason for Consult: ARF, edema            History of Present Illness: Patient is a 59 y.o. female with medical problems of HTN, HLD, DM, COPD, asthma, depression, OSA, ICD placement, diastolic CHF, h/o HOCM s/p myomectomy at [facility], prolonged hospitalization, CKD, A Fib, who was admitted to [facility] on 03/30/2020 for evaluation of SOB.  Nephrology consult requested for volume overload, acute renal failure  Today, patient is awake and is able to participate in the interview Reports that she has gained large amount of edema since admission She is able to eat without nausea or vomiting.  800% of her breakfast today Reports pain in her left gangrenous fingers   Vital Signs: Blood pressure (!) 115/92, pulse 75, temperature 98.1 F (36.7 C), temperature source Oral, resp. rate 19, height 5\' 4"  (1.626 m), weight 101.8 kg, SpO2 95 %.   Intake/Output Summary (Last 24 hours) at 04/05/2020 1128 Last data filed at 04/05/2020 0900 Gross per 24 hour  Intake 240 ml  Output 1400 ml  Net -1160 ml    Weight trends: Filed Weights   04/03/20 0440 04/04/20 0300 04/05/20 0450  Weight: 102.2 kg 102.9 kg 101.8 kg    Physical Exam: General:  No acute distress, laying in the bed  HEENT  anicteric, moist oral mucous membranes  Lungs:  Mild diffuse wheezing, mild basilar crackles, [place] O2  Heart::  regular rhythm, median sternotomy scar, 2/6 systolic murmur  Abdomen:  Soft, obese, nondistended  Extremities:  2-3+ tight pitting edema, b/l partial foot amputations  Neurologic:  Alert and oriented  Skin:  Warm, left hand dry gangrene 1-4 fingers, right hand -gangrene of the  tip of right thumb and index finger  Foley:  Urinary collecting system is in place       Lab results: Basic Metabolic Panel: Recent Labs  Lab 03/30/20 1339 03/30/20 1339 03/31/20 0442 04/01/20 0414 04/02/20 0629 04/02/20 0629 04/03/20 0526 04/04/20 0506 04/05/20 0546  NA 136   < > 137   < > 132*   < > 131* 130* 133*  K 4.3   < > 3.9   < > 5.1   < > 5.1 5.1 5.8*  CL 100   < > 101   < > 94*   < > 94* 96* 95*  CO2 26   < > 27   < > 24   < > 24 23 26   GLUCOSE 180*   < > 123*   < > 195*   < > 214* 226* 215*  BUN 23   < > 22   < > 31*   < > 45* 55* 60*  CREATININE 1.37*   < > 1.38*   < > 1.67*   < > 2.09* 2.30* 2.31*  CALCIUM 8.9   < > 8.9   < > 9.5   < > 9.1 9.1 9.3  MG 1.8  --  1.8  --  2.2  --   --   --   --   PHOS  --   --   --   --  4.6  --   --   --   --    < > = values in this interval not displayed.    Liver Function Tests: Recent Labs  Lab 03/30/20 1339 03/30/20 1339 04/05/20 0546  AST 53*  --   --   ALT 22  --   --   ALKPHOS 140*  --   --   BILITOT 1.2  --   --   PROT 7.5  --   --   ALBUMIN 3.4*   < > 3.8   < > = values in this interval not displayed.   No results for input(s): LIPASE, AMYLASE in the last 168 hours. No results for input(s): AMMONIA in the last 168 hours.  CBC: Recent Labs  Lab 03/30/20 1339 03/31/20 0442 04/04/20 0506 04/05/20 0546  WBC 7.5   < > 11.0* 7.2  NEUTROABS 5.7  --   --   --   HGB 8.3*   < > 8.6* 8.0*  HCT 27.1*   < > 25.4* 23.9*  MCV 83.6   < > 76.3* 76.8*  PLT 236   < > 294 231   < > = values in this interval not displayed.    Cardiac Enzymes: No results for input(s): CKTOTAL, TROPONINI in the last 168 hours.  BNP: Invalid input(s): POCBNP  CBG: Recent Labs  Lab 04/03/20 2106 04/04/20 0837 04/04/20 1111 04/04/20 2233 04/05/20 0746  GLUCAP 284* 237* 222* 287* 207*    Microbiology: Recent Results (from the past 720 hour(s))  SARS Coronavirus 2 by RT PCR (hospital order, performed in [facility] hospital  lab) Nasopharyngeal Nasopharyngeal Swab     Status: None   Collection Time: 03/30/20  1:51 PM   Specimen: Nasopharyngeal Swab  Result Value Ref Range Status   SARS Coronavirus 2 NEGATIVE NEGATIVE Final    Comment: (NOTE) SARS-CoV-2 target nucleic acids are NOT DETECTED.  The SARS-CoV-2 RNA is generally detectable in upper and lower respiratory specimens during the acute phase of infection. The lowest concentration of SARS-CoV-2 viral copies this assay can detect is 250 copies / mL. A negative result does not preclude SARS-CoV-2 infection and should not be used as the sole basis for treatment or other patient management decisions.  A negative result may occur with improper specimen collection / handling, submission of specimen other than nasopharyngeal swab, presence of viral mutation(s) within the areas targeted by this assay, and inadequate number of viral copies (<250 copies / mL). A negative result must be combined with clinical observations, patient history, and epidemiological information.  Fact Sheet for Patients:   StrictlyIdeas.no  Fact Sheet for Healthcare Providers: BankingDealers.co.za  This test is not yet approved or  cleared by the [place] FDA and has been authorized for detection and/or diagnosis of SARS-CoV-2 by FDA under an Emergency Use Authorization (EUA).  This EUA will remain in effect (meaning this test can be used) for the duration of the COVID-19 declaration under Section 564(b)(1) of the Act, 21 U.S.C. section 360bbb-3(b)(1), unless the authorization is terminated or revoked sooner.  Performed at [facility], [address] [place], [place] [address]   MRSA PCR Screening     Status: None   Collection Time: 03/30/20 10:23 PM   Specimen: Nasal Mucosa; Nasopharyngeal  Result Value Ref Range Status   MRSA by PCR NEGATIVE NEGATIVE Final    Comment:        The GeneXpert MRSA Assay  (FDA approved for NASAL specimens only), is one component of a comprehensive MRSA colonization surveillance program. It is not intended to diagnose MRSA infection nor to guide or monitor treatment for MRSA infections. Performed at [facility], [address] [place], [place] [address]      Coagulation Studies: No results for input(s): LABPROT, INR in the last 72 hours.  Urinalysis: Recent Labs    04/04/20 2140  COLORURINE YELLOW*  LABSPEC 1.008  PHURINE 5.0  GLUCOSEU NEGATIVE  HGBUR SMALL*  BILIRUBINUR NEGATIVE  KETONESUR NEGATIVE  PROTEINUR NEGATIVE  NITRITE NEGATIVE  LEUKOCYTESUR TRACE*      Scheduled Meds: . amiodarone  200 mg Oral Daily  . apixaban  5 mg Oral BID  . aspirin EC  81 mg Oral Daily  . atorvastatin  20 mg Oral Daily  . docusate sodium  100 mg Oral BID  . feeding supplement (NEPRO CARB STEADY)  237 mL Oral BID BM  . ferrous sulfate  325 mg Oral Q breakfast  . gabapentin  300 mg Oral QID  . insulin aspart  0-5 Units Subcutaneous QHS  . insulin aspart  0-9 Units Subcutaneous TID WC  . insulin detemir  20 Units Subcutaneous QHS  . ipratropium-albuterol  3 mL Nebulization TID  . methylPREDNISolone (SOLU-MEDROL) injection  60 mg Intravenous Q24H  . metoprolol succinate  12.5 mg Oral Daily  . multivitamin  1 tablet Oral Daily  . multivitamin with minerals  1 tablet Oral Daily  . mupirocin cream   Topical BID  . pantoprazole  40 mg Oral Daily  . polyethylene glycol  17 g Oral Daily  . sertraline  100 mg Oral Daily  . sodium chloride flush  3 mL Intravenous Q12H  . cyanocobalamin  1,000 mcg Oral Daily  . zinc sulfate  220 mg Oral Daily   Continuous Infusions: . sodium chloride    . albumin human 12.5 g (04/05/20 1045)  . furosemide (LASIX) infusion 5 mg/hr (04/04/20 1413)   PRN Meds:.sodium chloride, acetaminophen, albuterol, dextromethorphan-guaiFENesin, hydrALAZINE, ondansetron (ZOFRAN) IV, oxyCODONE, sodium chloride  flush    Imaging: US RENAL  Result Date: 04/04/2020 CLINICAL DATA:  59 year old female with acute renal failure. EXAM: RENAL / URINARY TRACT ULTRASOUND COMPLETE COMPARISON:  CT Abdomen and Pelvis 05/19/2019 FINDINGS: Right Kidney: Renal measurements: 10.0 x 4.5 x 5.0 cm = volume: 119 mL. Echogenicity within normal limits. No mass or hydronephrosis visualized. Left Kidney: Renal measurements: 11.1 x 5.3 x 4.8 cm = volume: 149 mL. Echogenicity within normal limits. No mass or hydronephrosis visualized. Bladder: Appears normal for degree of bladder distention. Other: Image quality throughout the exam mildly degraded by body habitus and anasarca. IMPRESSION: Normal ultrasound appearance of both kidneys and the urinary bladder. Electronically Signed   By: Genevie Ann M.D.   On: 04/04/2020 12:50     Assessment & Plan: Pt is a 59 y.o.   female with medical problems of HTN, HLD, DM, COPD, asthma, depression, OSA, ICD placement, diastolic CHF, h/o HOCM s/p myomectomy at [facility], prolonged hospitalization, CKD, A Fib , was admitted on 03/30/2020 with SOB (shortness of breath) [R06.02] Acute on chronic diastolic CHF (congestive heart failure) ([place]) [I50.33]   #Acute renal failure #Generalized edema #Chronic kidney disease stage III B.  Baseline creatinine 1.36, GFR 42-49 April 01, 2020 #Gangrenous changes bilateral hands,  left worse than right DM-2 with CKD # Grade 2 diastolic dysfunction #Hyperkalemia  Lab Results  Component Value Date   HGBA1C 6.7 (H) 03/30/2020   Urinalysis and renal ultrasound are unremarkable Patient was started on IV furosemide and albumin infusion supplementation on August 20.  Seems to have good response. Urine output of 1400 cc recorded yesterday Serum creatinine is stable compared to yesterday  Plan: Continue IV Lasix infusion IV albumin infusion [place] for hyperkalemia Will follow    LOS: 6 Earlisha Sharples 8/21/202111:28 AM    Note: This note was prepared with  Dragon dictation. Any transcription errors are unintentional

## 2020-04-06 LAB — CBC
HCT: 25.7 % — ABNORMAL LOW (ref 36.0–46.0)
Hemoglobin: 8.3 g/dL — ABNORMAL LOW (ref 12.0–15.0)
MCH: 25.9 pg — ABNORMAL LOW (ref 26.0–34.0)
MCHC: 32.3 g/dL (ref 30.0–36.0)
MCV: 80.3 fL (ref 80.0–100.0)
Platelets: 246 10*3/uL (ref 150–400)
RBC: 3.2 MIL/uL — ABNORMAL LOW (ref 3.87–5.11)
RDW: 22.4 % — ABNORMAL HIGH (ref 11.5–15.5)
WBC: 8.5 10*3/uL (ref 4.0–10.5)
nRBC: 1.7 % — ABNORMAL HIGH (ref 0.0–0.2)

## 2020-04-06 LAB — BASIC METABOLIC PANEL
Anion gap: 16 — ABNORMAL HIGH (ref 5–15)
BUN: 57 mg/dL — ABNORMAL HIGH (ref 8–23)
CO2: 26 mmol/L (ref 22–32)
Calcium: 9.4 mg/dL (ref 8.9–10.3)
Chloride: 92 mmol/L — ABNORMAL LOW (ref 98–111)
Creatinine, Ser: 2.08 mg/dL — ABNORMAL HIGH (ref 0.44–1.00)
GFR calc Af Amer: 29 mL/min — ABNORMAL LOW (ref >60)
GFR calc non Af Amer: 25 mL/min — ABNORMAL LOW (ref >60)
Glucose, Bld: 213 mg/dL — ABNORMAL HIGH (ref 70–99)
Potassium: 3.6 mmol/L (ref 3.5–5.1)
Sodium: 134 mmol/L — ABNORMAL LOW (ref 135–145)

## 2020-04-06 LAB — GLUCOSE, CAPILLARY
Glucose-Capillary: 192 mg/dL — ABNORMAL HIGH (ref 70–99)
Glucose-Capillary: 249 mg/dL — ABNORMAL HIGH (ref 70–99)
Glucose-Capillary: 297 mg/dL — ABNORMAL HIGH (ref 70–99)
Glucose-Capillary: 254 mg/dL — ABNORMAL HIGH (ref 70–99)

## 2020-04-06 NOTE — Progress Notes (Signed)
Attempted to get pt oob and into chair.  Pt sat on edge of bed for approximately  2 minutes and stated " I need to ly down."  Pt assisted back to bed.  Pt stated that she has not ambulated in six months.

## 2020-04-06 NOTE — Progress Notes (Signed)
[address] [place], [place] [address] Phone [phone]. Fax [phone]  Date: 04/06/2020                  Patient Name:  Julie Bender  MRN: [id number]  DOB: 05/30/1961  Age / Sex: 59 y.o., female         PCP: Deanne Coffer, MD                 Service Requesting Consult: IM/ Julie James, MD                 Reason for Consult: ARF, edema            History of Present Illness: Patient is a 59 y.o. female with medical problems of HTN, HLD, DM, COPD, asthma, depression, OSA, ICD placement, diastolic CHF, h/o HOCM s/p myomectomy at [facility], prolonged hospitalization, CKD, A Fib, who was admitted to [facility] on 03/30/2020 for evaluation of SOB.  Nephrology consult requested for volume overload, acute renal failure  Today, [name]  Still complains of wheezing and raspy breathing Serum creatinine is improving, urine output is increasing   08/21 0701 - 08/22 0700 In: 699.4 [P.O.:480; I.V.:180; IV Piggyback:39.5] Out: 1950 [Urine:1950]  Lab Results  Component Value Date   CREATININE 2.08 (H) 04/06/2020   CREATININE 2.31 (H) 04/05/2020   CREATININE 2.30 (H) 04/04/2020      Vital Signs: Blood pressure 137/88, pulse 74, temperature 98.7 F (37.1 C), temperature source Oral, resp. rate (!) 21, height 5\' 4"  (1.626 m), weight 101.4 kg, SpO2 100 %.   Intake/Output Summary (Last 24 hours) at 04/06/2020 1223 Last data filed at 04/06/2020 1000 Gross per 24 hour  Intake 459.42 ml  Output 2200 ml  Net -1740.58 ml    Weight trends: [employer]   04/04/20 0300 04/05/20 0450 04/06/20 0510  Weight: 102.9 kg 101.8 kg 101.4 kg    Physical Exam: General:  No acute distress, laying in the bed  HEENT  anicteric, moist oral mucous membranes  Lungs:  Mild diffuse wheezing, mild basilar crackles, [place] O2  Heart::  regular rhythm, median sternotomy scar, 2/6 systolic murmur  Abdomen:  Soft, obese, nondistended  Extremities:  2-3+ tight pitting edema, b/l  partial foot amputations  Neurologic:  Alert and oriented  Skin:  Warm, left hand dry gangrene 1-4 fingers, right hand -gangrene of the tip of right thumb and index finger  Foley:  Urinary collecting system is in place       Lab results: Basic Metabolic Panel: Recent Labs  Lab 03/30/20 1339 03/30/20 1339 03/31/20 0442 04/01/20 0414 04/02/20 0629 04/03/20 0526 04/04/20 0506 04/05/20 0546 04/06/20 0550  NA 136   < > 137   < > 132*   < > 130* 133* 134*  K 4.3   < > 3.9   < > 5.1   < > 5.1 5.8* 3.6  CL 100   < > 101   < > 94*   < > 96* 95* 92*  CO2 26   < > 27   < > 24   < > 23 26 26   GLUCOSE 180*   < > 123*   < > 195*   < > 226* 215* 213*  BUN 23   < > 22   < > 31*   < > 55* 60* 57*  CREATININE 1.37*   < > 1.38*   < > 1.67*   < > 2.30* 2.31* 2.08*  CALCIUM 8.9   < > 8.9   < > 9.5   < > 9.1 9.3 9.4  MG 1.8  --  1.8  --  2.2  --   --   --   --   PHOS  --   --   --   --  4.6  --   --   --   --    < > = values in this interval not displayed.    Liver Function Tests: Recent Labs  Lab 03/30/20 1339 03/30/20 1339 04/05/20 0546  AST 53*  --   --   ALT 22  --   --   ALKPHOS 140*  --   --   BILITOT 1.2  --   --   PROT 7.5  --   --   ALBUMIN 3.4*   < > 3.8   < > = values in this interval not displayed.   No results for input(s): LIPASE, AMYLASE in the last 168 hours. No results for input(s): AMMONIA in the last 168 hours.  CBC: Recent Labs  Lab 03/30/20 1339 03/31/20 0442 04/05/20 0546 04/06/20 0550  WBC 7.5   < > 7.2 8.5  NEUTROABS 5.7  --   --   --   HGB 8.3*   < > 8.0* 8.3*  HCT 27.1*   < > 23.9* 25.7*  MCV 83.6   < > 76.8* 80.3  PLT 236   < > 231 246   < > = values in this interval not displayed.    Cardiac Enzymes: No results for input(s): CKTOTAL, TROPONINI in the last 168 hours.  BNP: Invalid input(s): POCBNP  CBG: Recent Labs  Lab 04/05/20 1146 04/05/20 1623 04/05/20 2114 04/06/20 0741 04/06/20 1146  GLUCAP 301* 336* 303* 192* 249*     Microbiology: Recent Results (from the past 720 hour(s))  SARS Coronavirus 2 by RT PCR (hospital order, performed in [facility] hospital lab) Nasopharyngeal Nasopharyngeal Swab     Status: None   Collection Time: 03/30/20  1:51 PM   Specimen: Nasopharyngeal Swab  Result Value Ref Range Status   SARS Coronavirus 2 NEGATIVE NEGATIVE Final    Comment: (NOTE) SARS-CoV-2 target nucleic acids are NOT DETECTED.  The SARS-CoV-2 RNA is generally detectable in upper and lower respiratory specimens during the acute phase of infection. The lowest concentration of SARS-CoV-2 viral copies this assay can detect is 250 copies / mL. A negative result does not preclude SARS-CoV-2 infection and should not be used as the sole basis for treatment or other patient management decisions.  A negative result may occur with improper specimen collection / handling, submission of specimen other than nasopharyngeal swab, presence of viral mutation(s) within the areas targeted by this assay, and inadequate number of viral copies (<250 copies / mL). A negative result must be combined with clinical observations, patient history, and epidemiological information.  Fact Sheet for Patients:   StrictlyIdeas.no  Fact Sheet for Healthcare Providers: BankingDealers.co.za  This test is not yet approved or  cleared by the [place] FDA and has been authorized for detection and/or diagnosis of SARS-CoV-2 by FDA under an Emergency Use Authorization (EUA).  This EUA will remain in effect (meaning this test can be used) for the duration of the COVID-19 declaration under Section 564(b)(1) of the Act, 21 U.S.C. section 360bbb-3(b)(1), unless the authorization is terminated or revoked sooner.  Performed at [facility], [address], [place], [place] [address]   MRSA PCR  Screening     Status: None   Collection Time: 03/30/20 10:23 PM   Specimen: Nasal  Mucosa; Nasopharyngeal  Result Value Ref Range Status   MRSA by PCR NEGATIVE NEGATIVE Final    Comment:        The GeneXpert MRSA Assay (FDA approved for NASAL specimens only), is one component of a comprehensive MRSA colonization surveillance program. It is not intended to diagnose MRSA infection nor to guide or monitor treatment for MRSA infections. Performed at [facility], [address] [place], [place] [address]      Coagulation Studies: No results for input(s): LABPROT, INR in the last 72 hours.  Urinalysis: Recent Labs    04/04/20 2140  COLORURINE YELLOW*  LABSPEC 1.008  PHURINE 5.0  GLUCOSEU NEGATIVE  HGBUR SMALL*  BILIRUBINUR NEGATIVE  KETONESUR NEGATIVE  PROTEINUR NEGATIVE  NITRITE NEGATIVE  LEUKOCYTESUR TRACE*      Scheduled Meds: . amiodarone  200 mg Oral Daily  . apixaban  5 mg Oral BID  . aspirin EC  81 mg Oral Daily  . atorvastatin  20 mg Oral Daily  . docusate sodium  100 mg Oral BID  . feeding supplement (NEPRO CARB STEADY)  237 mL Oral BID BM  . ferrous sulfate  325 mg Oral Q breakfast  . gabapentin  300 mg Oral QID  . insulin aspart  0-5 Units Subcutaneous QHS  . insulin aspart  0-9 Units Subcutaneous TID WC  . insulin detemir  20 Units Subcutaneous QHS  . ipratropium-albuterol  3 mL Nebulization TID  . methylPREDNISolone (SOLU-MEDROL) injection  40 mg Intravenous Q24H  . metoprolol succinate  12.5 mg Oral Daily  . multivitamin  1 tablet Oral Daily  . multivitamin with minerals  1 tablet Oral Daily  . mupirocin cream   Topical BID  . pantoprazole  40 mg Oral Daily  . polyethylene glycol  17 g Oral Daily  . sertraline  100 mg Oral Daily  . sodium chloride flush  3 mL Intravenous Q12H  . cyanocobalamin  1,000 mcg Oral Daily  . zinc sulfate  220 mg Oral Daily   Continuous Infusions: . sodium chloride    . albumin human 12.5 g (04/06/20 0957)  . furosemide (LASIX) infusion 5 mg/hr (04/04/20 1413)   PRN Meds:.sodium  chloride, acetaminophen, albuterol, dextromethorphan-guaiFENesin, hydrALAZINE, ondansetron (ZOFRAN) IV, oxyCODONE, sodium chloride flush    Imaging: US RENAL  Result Date: 04/04/2020 CLINICAL DATA:  59 year old female with acute renal failure. EXAM: RENAL / URINARY TRACT ULTRASOUND COMPLETE COMPARISON:  CT Abdomen and Pelvis 05/19/2019 FINDINGS: Right Kidney: Renal measurements: 10.0 x 4.5 x 5.0 cm = volume: 119 mL. Echogenicity within normal limits. No mass or hydronephrosis visualized. Left Kidney: Renal measurements: 11.1 x 5.3 x 4.8 cm = volume: 149 mL. Echogenicity within normal limits. No mass or hydronephrosis visualized. Bladder: Appears normal for degree of bladder distention. Other: Image quality throughout the exam mildly degraded by body habitus and anasarca. IMPRESSION: Normal ultrasound appearance of both kidneys and the urinary bladder. Electronically Signed   By: Genevie Ann M.D.   On: 04/04/2020 12:50     Assessment & Plan: Pt is a 59 y.o.   female with medical problems of HTN, HLD, DM, COPD, asthma, depression, OSA, ICD placement, diastolic CHF, h/o HOCM s/p myomectomy at [facility], prolonged hospitalization, CKD, A Fib , was admitted on 03/30/2020 with SOB (shortness of breath) [R06.02] Acute on chronic diastolic CHF (congestive heart failure) ([place]) [I50.33]   #Acute renal failure #  Generalized edema #Chronic kidney disease stage III B.  Baseline creatinine 1.36, GFR 42-49 April 01, 2020 #Gangrenous changes bilateral hands, left worse than right # DM-2 with CKD # Grade 2 diastolic dysfunction # Hyperkalemia  Lab Results  Component Value Date   HGBA1C 6.7 (H) 03/30/2020   Urinalysis and renal ultrasound are unremarkable Patient was started on IV furosemide and albumin infusion supplementation on August 20.  Seems to have good response. Urine output of 1900 cc recorded yesterday Serum creatinine is starting to improve  Plan: Continue IV Lasix infusion-increase rate to 8  mg/h IV albumin infusion May discontinue [place]-as potassium level has improved. Will follow    LOS: 7 Zhion [place] 8/22/202112:23 PM    Note: This note was prepared with Dragon dictation. Any transcription errors are unintentional

## 2020-04-06 NOTE — Progress Notes (Signed)
PROGRESS NOTE  Julie Bender IHK:[id number] DOB: September 02, 1960 DOA: 03/30/2020 PCP: Deanne Coffer, MD  HPI/Recap of past 24 hours: HPI: Julie Bender is a 59 y.o. 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  ED Course: BNP 1811, troponin 50, COVID-19 PCR negative, RR 25, oxygen sat 93% on 3 L oxygen currently, chest x-ray showed cardiomegaly and vascular congestion.       Subjective: The patient was seen and examined this morning, remained stable no acute distress.  Much improved shortness of breath.  But reports excessive shortness of breath with any exertion. Per patient and nursing staff patient is mainly bedbound We encouraged patient to move yesterday once again was encouraged to move and ambulate or at least transfer to chair today. She is willing to try. Respiratory effort wheezing, rhonchi has much improved remains on 3 L of oxygen, satting 100%  Due to anasarca, fluid retention has been on Lasix drip-tolerating well Nephrology following  ---------------------------------------------------------------------------------------------------------------------------------------------  Assessment/Plan: Principal Problem:   Acute on chronic diastolic CHF (congestive heart failure) (HCC) Active Problems:   Depression   Elevated troponin   Acute on chronic respiratory failure with hypoxia (HCC)   COPD (chronic obstructive pulmonary disease) (HCC)   Hypertension   Type II diabetes mellitus with renal manifestations (HCC)   CKD (chronic kidney disease), stage IIIa   Atrial  fibrillation, chronic (HCC)   Acute on chronic respiratory failure with hypoxia   secondary to pulmonary Edema in the setting of acute on chronic diastolic CHF (congestive heart failure) (HCC) -anasarca  -Shortness of breath at rest improved -significantly worsens with any exertion  Remains oliguric -nephrology reconsulted   2D echo on 05/21/2019 showed EF > 75% patient has a bilateral 2+ leg edema, crackles on auscultation, elevated BNP 1811, chest x-ray showed cardiomegaly and vascular congestion, suggestive of pulmonary edema  Nephrology Dr. Candiss Norse following, continue IV Lasix gtt >>> . Monitoring I's and O's, daily weights very closely Monitoring kidney function improving 2.3, 2.31, 2.08 today  O2 demand has improved, back to baseline down from 5/ 4 liters to 3 L, satting 100%  Acute on chronic diastolic CHF -Volume overload, elevated BNP, pulmonary edema -evident on CXR -Management as above, on Lasix drip 2D echo reviewed Net I&O - 1250.6, -600  Elevated troponin  suspect demand ischemia in the setting of acute on chronic hypoxia:  -Likely ischemic demand, remained chest pain-free Troponin S peaked at 53 and trended down. ..  -Continue aspirin, Lipitor, metoprolol -As needed nitroglycerin Dual paced rhythm on EKG  QTC prolongation/history of P A. fib Remained stable asymptomatic Twelve-lead EKG, QTC greater than 500 Avoid QTC prolonging agents -Continue metoprolol, amiodarone, Eliquis   Depression Continue home regimen Remained stable  COPD (chronic obstructive pulmonary disease) ([place]):  -Management as above, continue to improve back to baseline static 1% on 3 L -On Solu-Medrol IV tapering down  Hypertension BP remained stable, -IV hydralazine as needed -Patient is on IV Lasix  >>> holding at this time, consented to change to p.o. -We will continue metoprolol  HLD: -Stable on statins, Lipitor  Type II diabetes mellitus with renal manifestations and  hyperglycemia ([place]): Hemoglobin A1c 6.7 on 03/30/2020  -Sliding scale  insulin and Levemir -Monitoring CBG closely  CKD-IIIB: -Management as above Currently at her baseline creatinine 1.3 with GFR 48 Nephrology Dr. Candiss Norse following, continue IV Lasix gtt >>> . 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 Consulted --- recommended no intervention at this time With concerning CT angiogram which cannot be completed due to worsening kidney function -Continue Eliquis     DVT ppx: Eliquis Code Status: Full code Family Communication: None at bedside.  Consults called:   Nephrology, vascular surgery,   Status is: Inpatient disc glasses something.  This could reduce the noise in people passing by Sherlynn Stalls   Dispo:  Patient From: [place]  Planned Disposition: [place]  Expected discharge date: 04/04/2020  Medically stable for discharge: No, due to ongoing management of acute on chronic diastolic CHF, ongoing diuresing.     Objective: Vitals:   04/05/20 2033 04/06/20 0510 04/06/20 0740 04/06/20 1144  BP:  135/73 (!) 151/73 137/88  Pulse:  73 75 74  Resp:  (!) 21    Temp:  97.8 F (36.6 C) 98.2 F (36.8 C) 98.7 F (37.1 C)  TempSrc:  Oral Oral Oral  SpO2: 100% 100% 100% 100%  Weight:  101.4 kg    Height:        Intake/Output Summary (Last 24 hours) at 04/06/2020 1232 Last data filed at 04/06/2020 1000 Gross per 24 hour  Intake 459.42 ml  Output 2200 ml  Net -1740.58 ml   Filed Weights   04/04/20 0300 04/05/20 0450 04/06/20 0510  Weight: 102.9 kg 101.8 kg 101.4 kg       Physical Exam:   General:  Alert, oriented, cooperative, no distress;   HEENT:  Normocephalic, PERRL, otherwise with in Normal limits   Neuro:  CNII-XII intact. ,  normal motor and sensation, reflexes intact   Lungs:    Improved wheezing, rhonchi, crackles bilateral lower lobes   Cardio:    S1/S2, RRR, No murmure, No Rubs or Gallops   Abdomen:   Soft, non-tender, bowel sounds active all four quadrants,  no guarding or peritoneal signs.  Muscular skeletal:  Limited exam - in bed, able to move all 4 extremities, Normal strength,  2+ pulses,  symmetric, No pitting edema  Skin:  Dry, warm to touch, negative for any Rashes, left hand/fingers positive for dry gangrene--lower extremity toes amputated    Wounds: Please see nursing documentation    lower ext. Amput. Gangrene fingers.  Negative for drainage, remained stable             Data Reviewed: CBC: Recent Labs  Lab 03/30/20 1339 03/31/20 0442 04/04/20 0506 04/05/20 0546 04/06/20 0550  WBC 7.5 7.8 11.0* 7.2 8.5  NEUTROABS 5.7  --   --   --   --   HGB 8.3* 8.1* 8.6* 8.0* 8.3*  HCT 27.1* 25.1* 25.4* 23.9* 25.7*  MCV 83.6 80.7 76.3* 76.8* 80.3  PLT 236 229 294 231 081   Basic Metabolic Panel: Recent Labs  Lab 03/30/20 1339 03/30/20 1339 03/31/20 0442 04/01/20 0414 04/02/20 0629 04/03/20 0526 04/04/20 0506 04/05/20 0546 04/06/20 0550  NA 136   < > 137   < > 132* 131* 130* 133* 134*  K 4.3   < > 3.9   < > 5.1 5.1 5.1 5.8* 3.6  CL 100   < >  101   < > 94* 94* 96* 95* 92*  CO2 26   < > 27   < > 24 24 23 26 26   GLUCOSE 180*   < > 123*   < > 195* 214* 226* 215* 213*  BUN 23   < > 22   < > 31* 45* 55* 60* 57*  CREATININE 1.37*   < > 1.38*   < > 1.67* 2.09* 2.30* 2.31* 2.08*  CALCIUM 8.9   < > 8.9   < > 9.5 9.1 9.1 9.3 9.4  MG 1.8  --  1.8  --  2.2  --   --   --   --   PHOS  --   --   --   --  4.6  --   --   --   --    < > = values in this interval not displayed.   GFR: Estimated Creatinine Clearance: 32.9 mL/min (A) (by C-G formula based on SCr of 2.08 mg/dL (H)). Liver Function Tests: Recent Labs  Lab 03/30/20 1339 04/05/20 0546  AST 53*  --   ALT 22  --   ALKPHOS 140*  --    BILITOT 1.2  --   PROT 7.5  --   ALBUMIN 3.4* 3.8    No results for input(s): HGBA1C in the last 72 hours. CBG: Recent Labs  Lab 04/05/20 1146 04/05/20 1623 04/05/20 2114 04/06/20 0741 04/06/20 1146  GLUCAP 301* 336* 303* 192* 249*   Lipid Profile: No results for input(s): CHOL, HDL, LDLCALC, TRIG, CHOLHDL, LDLDIRECT in the last 72 hours. Thyroid Function Tests: No results for input(s): TSH, T4TOTAL, FREET4, T3FREE, THYROIDAB in the last 72 hours. Anemia Panel: No results for input(s): VITAMINB12, FOLATE, FERRITIN, TIBC, IRON, RETICCTPCT in the last 72 hours. Urine analysis:    Component Value Date/Time   COLORURINE YELLOW (A) 04/04/2020 2140   APPEARANCEUR HAZY (A) 04/04/2020 2140   LABSPEC 1.008 04/04/2020 2140   PHURINE 5.0 04/04/2020 2140   GLUCOSEU NEGATIVE 04/04/2020 2140   HGBUR SMALL (A) 04/04/2020 2140   BILIRUBINUR NEGATIVE 04/04/2020 2140   KETONESUR NEGATIVE 04/04/2020 2140   PROTEINUR NEGATIVE 04/04/2020 2140   NITRITE NEGATIVE 04/04/2020 2140   LEUKOCYTESUR TRACE (A) 04/04/2020 2140   Sepsis Labs: @LABRCNTIP (procalcitonin:4,lacticidven:4) In Recent Results (from the past 240 hour(s))  SARS Coronavirus 2 by RT PCR (hospital order, performed in [place] hospital lab) Nasopharyngeal Nasopharyngeal Swab     Status: None   Collection Time: 03/30/20  1:51 PM   Specimen: Nasopharyngeal Swab  Result Value Ref Range Status   SARS Coronavirus 2 NEGATIVE NEGATIVE Final    Comment: (NOTE) SARS-CoV-2 target nucleic acids are NOT DETECTED.  The SARS-CoV-2 RNA is generally detectable in upper and lower respiratory specimens during the acute phase of infection. The lowest concentration of SARS-CoV-2 viral copies this assay can detect is 250 copies / mL. A negative result does not preclude SARS-CoV-2 infection and should not be used as the sole basis for treatment or other patient management decisions.  A negative result may occur with improper specimen  collection / handling, submission of specimen other than nasopharyngeal swab, presence of viral mutation(s) within the areas targeted by this assay, and inadequate number of viral copies (<250 copies / mL). A negative result must be combined with clinical observations, patient history, and epidemiological information.  Fact Sheet for Patients:   StrictlyIdeas.no  Fact Sheet for Healthcare Providers: BankingDealers.co.za  This test is not yet approved or  cleared by the [place] and has been authorized for detection and/or diagnosis of SARS-CoV-2 by FDA under an Emergency Use Authorization (EUA).  This EUA will remain in effect (meaning this test can be used) for the duration of the COVID-19 declaration under Section 564(b)(1) of the Act, 21 U.S.C. section 360bbb-3(b)(1), unless the authorization is terminated or revoked sooner.  Performed at [facility], [address] [place], [place] [address]   MRSA PCR Screening     Status: None   Collection Time: 03/30/20 10:23 PM   Specimen: Nasal Mucosa; Nasopharyngeal  Result Value Ref Range Status   MRSA by PCR NEGATIVE NEGATIVE Final    Comment:        The GeneXpert MRSA Assay (FDA approved for NASAL specimens only), is one component of a comprehensive MRSA colonization surveillance program. It is not intended to diagnose MRSA infection nor to guide or monitor treatment for MRSA infections. Performed at [facility], [address], [place], [place] [address]       Studies: No results found.  Scheduled Meds: . amiodarone  200 mg Oral Daily  . apixaban  5 mg Oral BID  . aspirin EC  81 mg Oral Daily  . atorvastatin  20 mg Oral Daily  . docusate sodium  100 mg Oral BID  . feeding supplement (NEPRO CARB STEADY)  237 mL Oral BID BM  . ferrous sulfate  325 mg Oral Q breakfast  . gabapentin  300 mg Oral QID  . insulin aspart  0-5 Units Subcutaneous  QHS  . insulin aspart  0-9 Units Subcutaneous TID WC  . insulin detemir  20 Units Subcutaneous QHS  . ipratropium-albuterol  3 mL Nebulization TID  . methylPREDNISolone (SOLU-MEDROL) injection  40 mg Intravenous Q24H  . metoprolol succinate  12.5 mg Oral Daily  . multivitamin  1 tablet Oral Daily  . multivitamin with minerals  1 tablet Oral Daily  . mupirocin cream   Topical BID  . pantoprazole  40 mg Oral Daily  . polyethylene glycol  17 g Oral Daily  . sertraline  100 mg Oral Daily  . sodium chloride flush  3 mL Intravenous Q12H  . cyanocobalamin  1,000 mcg Oral Daily  . zinc sulfate  220 mg Oral Daily    Continuous Infusions: . sodium chloride    . albumin human 12.5 g (04/06/20 0957)  . furosemide (LASIX) infusion 5 mg/hr (04/04/20 1413)     LOS: 7 days     Deatra James, MD [facility] Pager [phone]  If 7PM-7AM, please contact night-coverage [email or web address] Password [email or web address] 04/06/2020, 12:32 PM

## 2020-04-07 LAB — BASIC METABOLIC PANEL
Anion gap: 17 — ABNORMAL HIGH (ref 5–15)
BUN: 57 mg/dL — ABNORMAL HIGH (ref 8–23)
CO2: 27 mmol/L (ref 22–32)
Calcium: 9.4 mg/dL (ref 8.9–10.3)
Chloride: 92 mmol/L — ABNORMAL LOW (ref 98–111)
Creatinine, Ser: 1.97 mg/dL — ABNORMAL HIGH (ref 0.44–1.00)
GFR calc Af Amer: 31 mL/min — ABNORMAL LOW (ref >60)
GFR calc non Af Amer: 27 mL/min — ABNORMAL LOW (ref >60)
Glucose, Bld: 206 mg/dL — ABNORMAL HIGH (ref 70–99)
Potassium: 3.1 mmol/L — ABNORMAL LOW (ref 3.5–5.1)
Sodium: 136 mmol/L (ref 135–145)

## 2020-04-07 LAB — CBC
HCT: 24.5 % — ABNORMAL LOW (ref 36.0–46.0)
Hemoglobin: 8.3 g/dL — ABNORMAL LOW (ref 12.0–15.0)
MCH: 26.2 pg (ref 26.0–34.0)
MCHC: 33.9 g/dL (ref 30.0–36.0)
MCV: 77.3 fL — ABNORMAL LOW (ref 80.0–100.0)
Platelets: 249 10*3/uL (ref 150–400)
RBC: 3.17 MIL/uL — ABNORMAL LOW (ref 3.87–5.11)
RDW: 21.9 % — ABNORMAL HIGH (ref 11.5–15.5)
WBC: 11.8 10*3/uL — ABNORMAL HIGH (ref 4.0–10.5)
nRBC: 0.9 % — ABNORMAL HIGH (ref 0.0–0.2)

## 2020-04-07 LAB — GLUCOSE, CAPILLARY
Glucose-Capillary: 136 mg/dL — ABNORMAL HIGH (ref 70–99)
Glucose-Capillary: 281 mg/dL — ABNORMAL HIGH (ref 70–99)
Glucose-Capillary: 367 mg/dL — ABNORMAL HIGH (ref 70–99)
Glucose-Capillary: 375 mg/dL — ABNORMAL HIGH (ref 70–99)

## 2020-04-07 MED ORDER — ALPRAZOLAM 0.5 MG PO TABS
0.5000 mg | ORAL_TABLET | Freq: Three times a day (TID) | ORAL | Status: DC | PRN
  Administered 2020-04-07 – 2020-04-15 (×4): 0.5 mg via ORAL
  Filled 2020-04-07 (×4): qty 1

## 2020-04-07 MED ORDER — POTASSIUM CHLORIDE CRYS ER 20 MEQ PO TBCR
20.0000 meq | EXTENDED_RELEASE_TABLET | Freq: Two times a day (BID) | ORAL | Status: DC
  Administered 2020-04-07 – 2020-04-09 (×6): 20 meq via ORAL
  Filled 2020-04-07 (×6): qty 1

## 2020-04-07 MED ORDER — METHYLPREDNISOLONE SODIUM SUCC 40 MG IJ SOLR
30.0000 mg | INTRAMUSCULAR | Status: DC
  Administered 2020-04-08 – 2020-04-10 (×3): 30 mg via INTRAVENOUS
  Filled 2020-04-07 (×3): qty 1

## 2020-04-07 MED ORDER — GABAPENTIN 100 MG PO CAPS
100.0000 mg | ORAL_CAPSULE | Freq: Four times a day (QID) | ORAL | Status: DC
  Administered 2020-04-07 – 2020-04-18 (×43): 100 mg via ORAL
  Filled 2020-04-07 (×44): qty 1

## 2020-04-07 NOTE — Progress Notes (Signed)
[address] [place], [place] [address] Phone [phone]. Fax [phone]  Date: 04/07/2020                  Patient Name:  Julie Bender  MRN: [id number]  DOB: [birth year]  Age / Sex: 59 y.o., female         PCP: Deanne Coffer, MD                 Service Requesting Consult: IM/ Deatra James, MD                 Reason for Consult: ARF, edema            History of Present Illness: Patient is a 59 y.o. female with medical problems of HTN, HLD, DM, COPD, asthma, depression, OSA, ICD placement, diastolic CHF, h/o HOCM s/p myomectomy at [facility], prolonged hospitalization, CKD, A Fib, who was admitted to [facility] on 03/30/2020 for evaluation of SOB.  Nephrology consult requested for volume overload, acute renal failure  Today patient reports feeling better, still has dyspnea on exertion.    08/22 0701 - 08/23 0700 In: -  Out: 1800 [Urine:1800]  Lab Results  Component Value Date   CREATININE 1.97 (H) 04/07/2020   CREATININE 2.08 (H) 04/06/2020   CREATININE 2.31 (H) 04/05/2020      Vital Signs: Blood pressure 120/65, pulse 75, temperature 98.3 F (36.8 C), resp. rate 18, height 5\' 4"  (1.626 m), weight 101.8 kg, SpO2 99 %.   Intake/Output Summary (Last 24 hours) at 04/07/2020 1314 Last data filed at 04/07/2020 0755 Gross per 24 hour  Intake --  Output 1800 ml  Net -1800 ml    Weight trends: Filed Weights   04/05/20 0450 04/06/20 0510 04/07/20 0440  Weight: 101.8 kg 101.4 kg 101.8 kg    Physical Exam: General:  No acute distress, lying in the bed  HEENT  moist oral mucous membranes,O2 3L via [place]  Lungs:  Bilateral lungs with wheezing and mild basilar crackles  Heart::  Irregular rhythm, median sternotomy scar, 2/6 systolic murmur  Abdomen:  Soft, obese, nondistended  Extremities:  2+ tight pitting edema, b/l partial foot amputations  Neurologic:  Alert and oriented  Skin:  Warm, 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    Liver Function Tests: Recent Labs  Lab 04/05/20 0546  ALBUMIN 3.8   No results for input(s): LIPASE, AMYLASE in the last 168 hours. No results for input(s): AMMONIA in the last 168 hours.  CBC: Recent Labs  Lab 04/06/20 0550 04/07/20 0419  WBC 8.5 11.8*  HGB 8.3* 8.3*  HCT 25.7* 24.5*  MCV 80.3 77.3*  PLT 246 249    Cardiac Enzymes: No results for input(s): CKTOTAL, TROPONINI in the last 168 hours.  BNP: Invalid input(s): POCBNP  CBG: Recent Labs  Lab 04/06/20 1146 04/06/20 1658 04/06/20 2322 04/07/20 0752 04/07/20 1203  GLUCAP 249* 297* 254* 136* 281*    Microbiology: Recent Results (from the past 720 hour(s))  SARS Coronavirus 2 by RT PCR (hospital order, performed in [facility] hospital lab) Nasopharyngeal Nasopharyngeal Swab     Status: None   Collection Time: 03/30/20  1:51 PM   Specimen: Nasopharyngeal Swab  Result Value Ref Range Status   SARS Coronavirus 2 NEGATIVE NEGATIVE Final    Comment: (NOTE) SARS-CoV-2 target nucleic acids are NOT DETECTED.  The SARS-CoV-2 RNA is generally detectable in upper and lower respiratory specimens during the acute phase of infection. The lowest concentration of SARS-CoV-2 viral copies this assay can detect is 250 copies / mL. A negative result does not preclude SARS-CoV-2 infection and should not be used as the sole basis for treatment or other patient  management decisions.  A negative result may occur with improper specimen collection / handling, submission of specimen other than nasopharyngeal swab, presence of viral mutation(s) within the areas targeted by this assay, and inadequate number of viral copies (<250 copies / mL). A negative result must be combined with clinical observations, patient history, and epidemiological information.  Fact Sheet for Patients:   StrictlyIdeas.no  Fact Sheet for Healthcare Providers: BankingDealers.co.za  This test is not yet approved or  cleared by the [place] FDA and has been authorized for detection and/or diagnosis of SARS-CoV-2 by FDA under an Emergency Use Authorization (EUA).  This EUA will remain in effect (meaning this test can be used) for the duration of the COVID-19 declaration under Section 564(b)(1) of the Act, 21 U.S.C. section 360bbb-3(b)(1), unless the authorization is terminated or revoked sooner.  Performed at [facility], [address] [place], [place] [address]   MRSA PCR Screening     Status: None   Collection Time: 03/30/20 10:23 PM   Specimen: Nasal Mucosa; Nasopharyngeal  Result Value Ref Range Status   MRSA by PCR NEGATIVE NEGATIVE Final    Comment:        The GeneXpert MRSA Assay (FDA approved for NASAL specimens only), is one component of a comprehensive MRSA colonization surveillance program. It is not intended to diagnose MRSA infection nor to guide or monitor treatment for MRSA infections. Performed at [facility], [address] [place], [place] [address]      Coagulation Studies: No results for input(s): LABPROT, INR in the last 72 hours.  Urinalysis: Recent Labs    04/04/20 2140  COLORURINE YELLOW*  LABSPEC 1.008  PHURINE 5.0  GLUCOSEU NEGATIVE  HGBUR SMALL*  BILIRUBINUR NEGATIVE  KETONESUR NEGATIVE  PROTEINUR NEGATIVE  NITRITE NEGATIVE  LEUKOCYTESUR TRACE*       Scheduled Meds: . amiodarone  200 mg Oral Daily  . apixaban  5 mg Oral BID  . aspirin EC  81 mg Oral Daily  . atorvastatin  20 mg Oral Daily  . docusate sodium  100 mg Oral BID  . feeding supplement (NEPRO CARB STEADY)  237 mL Oral BID BM  . ferrous sulfate  325 mg Oral Q breakfast  . gabapentin  100 mg Oral QID  . insulin aspart  0-5 Units Subcutaneous QHS  .  insulin aspart  0-9 Units Subcutaneous TID WC  . insulin detemir  20 Units Subcutaneous QHS  . ipratropium-albuterol  3 mL Nebulization TID  . [START ON 04/08/2020] methylPREDNISolone (SOLU-MEDROL) injection  30 mg Intravenous Q24H  . metoprolol succinate  12.5 mg Oral Daily  . multivitamin  1 tablet Oral Daily  . multivitamin with minerals  1 tablet Oral Daily  . mupirocin cream   Topical BID  . pantoprazole  40 mg Oral Daily  . polyethylene glycol  17 g Oral Daily  . potassium chloride  20 mEq Oral BID  . sertraline  100 mg Oral Daily  . sodium chloride flush  3 mL Intravenous Q12H  . cyanocobalamin  1,000 mcg Oral Daily  . zinc sulfate  220 mg Oral Daily   Continuous Infusions: . sodium chloride    . albumin human 12.5 g (04/07/20 1007)  . furosemide (LASIX) infusion 8 mg/hr (04/06/20 1255)   PRN Meds:.sodium chloride, acetaminophen, albuterol, dextromethorphan-guaiFENesin, hydrALAZINE, ondansetron (ZOFRAN) IV, oxyCODONE, sodium chloride flush    Imaging: No results found.   Assessment & Plan: Pt is a 59 y.o.   female with medical problems of HTN, HLD, DM, COPD, asthma, depression, OSA, ICD placement, diastolic CHF, h/o HOCM s/p myomectomy at [facility], prolonged hospitalization, CKD, A Fib , was admitted on 03/30/2020 with SOB (shortness of breath) [R06.02] Acute on chronic diastolic CHF (congestive heart failure) ([place]) [I50.33]   #Acute renal failure #Generalized edema #Chronic kidney disease stage III B.  Baseline creatinine 1.36, GFR 42-49 April 01, 2020 #Gangrenous changes bilateral hands, left worse than  right # DM-2 with CKD # Grade 2 diastolic dysfunction # Hyperkalemia, now resolved  Lab Results  Component Value Date   HGBA1C 6.7 (H) 03/30/2020   Urinalysis and renal ultrasound are unremarkable Patient continuing on IV furosemide and albumin infusion with good results Serum creatinine today is 2 mg/dl  Plan: We will plan to maintain the patient on Lasix drip at this time given ongoing volume overload coupled with IV albumin infusion.  This problem is likely slow to resolve.  Encourage patient to decrease fluid intake as well.     LOS: 8 Lindy Pennisi 8/23/20211:14 PM

## 2020-04-07 NOTE — ACP (Advance Care Planning) (Signed)
Pt. designates sister Blossom Hoops as primary HCA, sister Ronald Pippins as alt. HCA.  Pt. would like [name] to handle healthcare decisions except those dealing with life support, which are to be deferred to [place].  Pt. does not want life-prolonging treatment in any of the three scenarios described.  AD signature witnessed and Notarized; copy placed in chart and scanned to [facility].

## 2020-04-07 NOTE — Progress Notes (Signed)
Physical Therapy Treatment Patient Details Name: Julie Bender MRN: [id number] DOB: 01-24-1961 Today's Date: 04/07/2020    History of Present Illness Pt is a 59 y.o. female with PMH significant of htn, HLD, DM, COPD on 3 L O2, asthma, depression, OSA, ICD placement, dCHF, HOCM s/p 09/2019 [facility] myectomy with a prolonged hospital course and multiple complications, toe amputations B, CKD-III, a-fib on Eliquis, who presents with a CHF exacerbation and acute respiratory failure with hypoxia.  Pt is from SNF.    PT Comments    Pt sleeping in bed upon PT arrival; woken with vc's and tactile cues but pt unable to maintain alertness to participate more than 2 reps of each LE exercise.  Therapist kept trying to give pt vc's and tactiles cues to become more alert/stay alert--pt would talk with therapist but pt appeared lethargic with minimal initiation and would quickly fall back asleep; d/t this deferred OOB mobility.  Nurse and MD notified of pt's lethargy and difficulty with participating in therapy session.  Will continue to focus on strengthening and progressive mobility per pt tolerance.   Follow Up Recommendations  SNF     Equipment Recommendations   (TBD at next facility)    Recommendations for Other Services       Precautions / Restrictions Precautions Precautions: Fall Restrictions Weight Bearing Restrictions: No    Mobility  Bed Mobility        Transfers                  Ambulation/Gait                 Stairs             Wheelchair Mobility    Modified Rankin (Stroke Patients Only)       Balance                                  Cognition Arousal/Alertness: Lethargic Behavior During Therapy: Flat affect Overall Cognitive Status: Within Functional Limits for tasks assessed                                 General Comments: Lethargic; able to wake up briefly but pt would fall back asleep again fairly quickly and  unable to maintain alertness      Exercises Total Joint Exercises Quad Sets:  (x2 AROM B LE's semi-supine in bed) Heel Slides:  (x2 AROM B LE's semi-supine in bed) Hip ABduction/ADduction:  (x2 AROM B LE's semi-supine in bed)    General Comments        Pertinent Vitals/Pain Pain Assessment: Faces Faces Pain Scale: Hurts little more Pain Location: thighs in sitting Pain Descriptors / Indicators: Tender;Sore Pain Intervention(s): Monitored during session  Vitals (HR and O2 on room air) stable and WFL throughout treatment session.    Home Living                      Prior Function            PT Goals (current goals can now be found in the care plan section) Acute Rehab PT Goals Patient Stated Goal: to complete rehab and return home  PT Goal Formulation: With patient Time For Goal Achievement: 04/14/20 Potential to Achieve Goals: Fair Progress towards PT goals: Not progressing toward goals - comment (d/t lethargy)  Frequency    Min 2X/week      PT Plan Current plan remains appropriate    Co-evaluation              AM-PAC PT "6 Clicks" Mobility   Outcome Measure  Help needed turning from your back to your side while in a flat bed without using bedrails?: A Lot Help needed moving from lying on your back to sitting on the side of a flat bed without using bedrails?: Total Help needed moving to and from a bed to a chair (including a wheelchair)?: Total Help needed standing up from a chair using your arms (e.g., wheelchair or bedside chair)?: Total Help needed to walk in hospital room?: Total Help needed climbing 3-5 steps with a railing? : Total 6 Click Score: 7    End of Session Equipment Utilized During Treatment: Oxygen (3 L O2 via nasal cannula) Activity Tolerance: Patient limited by lethargy Patient left: in bed;with call bell/phone within reach;with bed alarm set;with nursing/sitter in room;Other (comment) (NT present getting vitals) Nurse  Communication: Other (comment) (pt's lethargy) PT Visit Diagnosis: Unsteadiness on feet (R26.81);Muscle weakness (generalized) (M62.81);Other abnormalities of gait and mobility (R26.89)     Time: 4268-3419 PT Time Calculation (min) (ACUTE ONLY): 20 min  Charges:  $Therapeutic Exercise: 8-22 mins                    Leitha Bleak, PT 04/07/20, 4:04 PM

## 2020-04-07 NOTE — Progress Notes (Signed)
[place] visited pt. as follow-up from earlier visits attempting to address pt.'s request for AD; [place] helped pt. redo AD as original copy was lost in rm. transfer.  Pt. working w/OT when [facility] entered, seemed fatigued by therapy, but eager to gain strength and participate.  Pt. designates sister Blossom Hoops as primary HCA, sister Ronald Pippins as alt. HCA.  Pt. would like [name] to handle healthcare decisions except those dealing with life support, which are to be deferred to [place].  Pt. does not want life-prolonging treatment in any of the three scenarios described.  AD signature witnessed and Notarized; copy placed in chart and scanned to [facility].  after notarization, CH lingered and helped pt. call her sister to get phone charger.  Pt. also requested prayer for her health and for her son who is going through a difficult time in his life now.  CH remains available as needed.

## 2020-04-07 NOTE — Progress Notes (Signed)
Occupational Therapy Treatment Patient Details Name: Julie Bender MRN: [id number] DOB: [birth year] Today's Date: 04/07/2020    History of present illness Pt is a 59 y.o. female with PMH significant of htn, HLD, DM, COPD on 3 L O2, asthma, depression, OSA, ICD placement, dCHF, HOCM s/p 09/2019 [facility] myectomy with a prolonged hospital course and multiple complications, toe amputations B, CKD-III, a-fib on Eliquis, who presents with a CHF exacerbation and acute respiratory failure with hypoxia.  Pt is from SNF.   OT comments  Pt seen for OT tx this date to f/u re: safety with ADLs/ADL mobility. Pt requires MAX to TOTAL A for sup to sit and demos low tolerance for EOB static sitting. Pt tolerates approximately 7 mins all together, requiring primarily MAX A to sustain. Pt with short bouts of contributing to static sitting balance/engaging core when verbally cued requiring only MOD A to sustain balance, but majority of time spent in static sitting, pt requires MAX A. Pt with some SOB with transition to sitting initially, but unable to read oxygen well d/t several necrotic fingers. Pt requires MAX to TOTAL A for transition back to bed. Pt educated re: moving limbs throughout the day to help sustain circulation and prevent atrophy. Pt with good understanding. Will continue to follow. Anticipate SNF remains most appropriate d/c recommendation.   Follow Up Recommendations  SNF    Equipment Recommendations  Other (comment) (defer to next venue of care)    Recommendations for Other Services      Precautions / Restrictions Precautions Precautions: Fall Restrictions Weight Bearing Restrictions: No       Mobility Bed Mobility Overal bed mobility: Needs Assistance Bed Mobility: Supine to Sit;Sit to Supine     Supine to sit: Max assist;Total assist Sit to supine: Max assist;Total assist   General bed mobility comments: assistance to manage UB/LB, cues for hand placement/foot placement to assist in  propulsion to EOB. Extended time, HOB elevated.  Transfers                 General transfer comment: unable to progress d/t impaired sitting balance    Balance Overall balance assessment: Needs assistance Sitting-balance support: Bilateral upper extremity supported Sitting balance-Leahy Scale: Zero Sitting balance - Comments: Pt requiring MAX A to sustain static sit Postural control: Posterior lean                                 ADL either performed or assessed with clinical judgement   ADL                                               Vision Baseline Vision/History: Wears glasses Patient Visual Report: No change from baseline     Perception     Praxis      Cognition Arousal/Alertness: Awake/alert;Lethargic Behavior During Therapy: WFL for tasks assessed/performed Overall Cognitive Status: Within Functional Limits for tasks assessed                                 General Comments: Pt lethargic initailly, requires increased time, verbal/tactile cues and ultimately position change to wake up and engage in session.        Exercises Other Exercises Other Exercises: OT facilitates pt participation  in seated balance activities to increase strength and tolerance for seated functional tasks. Pt with P balance requiring MIN verbal cues and MOD to MAX A to sustain static sit. Pt able to participate in correcting upright sitting once cued, but low tolerance for maintaining upright position. ~7 mins sitting all together.   Shoulder Instructions       General Comments      Pertinent Vitals/ Pain       Pain Assessment: Faces Faces Pain Scale: Hurts little more Pain Location: thighs in sitting Pain Descriptors / Indicators: Tender;Sore Pain Intervention(s): Monitored during session  Home Living                                          Prior Functioning/Environment              Frequency  Min  1X/week        Progress Toward Goals  OT Goals(current goals can now be found in the care plan section)  Progress towards OT goals: Progressing toward goals  Acute Rehab OT Goals Patient Stated Goal: to complete rehab and return home  OT Goal Formulation: With patient Time For Goal Achievement: 04/17/20 Potential to Achieve Goals: Good  Plan Discharge plan remains appropriate    Co-evaluation                 AM-PAC OT "6 Clicks" Daily Activity     Outcome Measure   Help from another person eating meals?: A Little Help from another person taking care of personal grooming?: A Little Help from another person toileting, which includes using toliet, bedpan, or urinal?: Total Help from another person bathing (including washing, rinsing, drying)?: A Lot Help from another person to put on and taking off regular upper body clothing?: A Little Help from another person to put on and taking off regular lower body clothing?: A Lot 6 Click Score: 14    End of Session    OT Visit Diagnosis: Other abnormalities of gait and mobility (R26.89);Muscle weakness (generalized) (M62.81)   Activity Tolerance Patient limited by lethargy   Patient Left in bed;with call bell/phone within reach;with bed alarm set   Nurse Communication          Time: 5009-3818 OT Time Calculation (min): 24 min  Charges: OT General Charges $OT Visit: 1 Visit OT Treatments $Therapeutic Activity: 23-37 mins  Gerrianne Scale, [place], OTR/L ascom [phone] 04/07/20, 3:31 PM

## 2020-04-07 NOTE — Progress Notes (Signed)
Inpatient Diabetes Program Recommendations  AACE/ADA: New Consensus Statement on Inpatient Glycemic Control   Target Ranges:  Prepandial:   less than 140 mg/dL      Peak postprandial:   less than 180 mg/dL (1-2 hours)      Critically ill patients:  140 - 180 mg/dL   Results for Julie Bender, Julie Bender (MRN [id number]) as of 04/07/2020 12:54  Ref. Range 04/06/2020 07:41 04/06/2020 11:46 04/06/2020 16:58 04/06/2020 23:22 04/07/2020 07:52 04/07/2020 12:03  Glucose-Capillary Latest Ref Range: 70 - 99 mg/dL 192 (H) 249 (H) 297 (H) 254 (H) 136 (H) 281 (H)   Review of Glycemic Control  Current orders for Inpatient glycemic control: Levemir 20 units QHS, Novolog 0-9 units TID with meals, Novolog 0-5 units QHS; Solumedrol 30 mg Q24H  Inpatient Diabetes Program Recommendations:    Insulin-Meal Coverage: If steroids are continued, please consider ordering Novolog 5 units TID with meals for meal coverage if patient eats at least 50% of meals.  Thanks, Barnie Alderman, RN, MSN, CDE Diabetes Coordinator Inpatient Diabetes Program [phone] (Team Pager from 8am to 5pm)

## 2020-04-07 NOTE — Progress Notes (Signed)
cpap declined at this time. Pt will have RN call when she is ready to go on it.

## 2020-04-07 NOTE — Progress Notes (Signed)
Mobility Specialist - Progress Note   04/07/20 1615  Mobility  Activity Refused mobility (Mobility concluded session)  Mobility performed by Mobility specialist     Pt is not appropriate for session. Pt acknowledged mobility upon entering, but appears lethargic and was unable to stay awake/alert for session. Pt's BP sat at 98/60, 72 HR. Noticed heavy breathing, O2 sat at 99%. However, pt c/o "shallow breathing". Will attempt session at a more appropriate time. Nurse was immediately notified.    Julie Bender Mobility Specialist 04/07/20, 4:22 PM

## 2020-04-07 NOTE — Care Management Important Message (Signed)
Important Message  Patient Details  Name: Julie Bender MRN: [id number] Date of Birth: 07/06/1961   Medicare Important Message Given:  Yes     Dannette Barbara 04/07/2020, 2:28 PM

## 2020-04-07 NOTE — Progress Notes (Signed)
Marland Kitchen   PROGRESS NOTE  Julie Bender JIR:[id number] DOB: November 22, 1960 DOA: 03/30/2020 PCP: Deanne Coffer, MD  HPI/Recap of past 24 hours: HPI: Julie Bender is a 59 y.o. 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  ED Course: BNP 1811, troponin 50, COVID-19 PCR negative, RR 25, oxygen sat 93% on 3 L oxygen currently, chest x-ray showed cardiomegaly and vascular congestion.       Subjective: The patient was seen and examined this morning, much more awake alert oriented, improved wheezing, improved shortness of breath. No issues overnight She remains nonmotivated regarding ambulation movement out of the bed Satting 100% on 3 L of oxygen  Remains on albumin and Lasix drip, diuresing well Nephrology following  ---------------------------------------------------------------------------------------------------------------------------------------------  Assessment/Plan: Principal Problem:   Acute on chronic diastolic CHF (congestive heart failure) (HCC) Active Problems:   Depression   Elevated troponin   Acute on chronic respiratory failure with hypoxia (HCC)   COPD (chronic obstructive pulmonary disease) (HCC)   Hypertension   Type II diabetes mellitus with renal manifestations (HCC)   CKD (chronic kidney disease), stage IIIa   Atrial fibrillation, chronic (HCC)   Acute on chronic respiratory failure with hypoxia   secondary to pulmonary Edema in the setting of acute on chronic diastolic CHF (congestive heart failure) (HCC) -anasarca  -Much improved respiratory status, Has been weaned  off to baseline 3 L of oxygen, currently satting 100% Anticipating tapering down O2 demand O2 sat goal 88-92%  2D echo on 05/21/2019 showed EF > 75% patient has a bilateral 2+ leg edema, crackles on auscultation, elevated BNP 1811, chest x-ray showed cardiomegaly and vascular congestion, suggestive of pulmonary edema  Acute renal failure in the setting of volume overload, CHF/respiratory stress Nephrology Dr. Candiss Norse following, continue IV Lasix gtt >>>  Albumin replacement Monitoring I's and O's, daily weights very closely (-1800, -600) Monitoring kidney function improving 2.3, 2.31, 2.08 >>  today  O2 demand has improved, back to baseline down from 5/ 4 liters to 3 L, satting 100%  Acute on chronic diastolic CHF -Volume overload, elevated BNP, pulmonary edema -evident on CXR -Remains on Lasix drip 2D echo reviewed Net I&O -1800, -600  Elevated troponin  Ischemic demand in setting of respiratory distress, CHF exacerbation  -Remained chest pain-free Troponin S peaked at 53 and trended down. ..  -Continue aspirin, Lipitor, metoprolol -As needed nitroglycerin Dual paced rhythm on EKG  QTC prolongation/history of P A. fib -Asymptomatic, monitoring, heart rate well controlled Twelve-lead EKG, QTC greater than 500 Avoid QTC prolonging agents -Continue metoprolol, amiodarone, Eliquis   Depression Continue home regimen Remained stable  COPD (chronic obstructive pulmonary disease) ([place]):  -Management as above, continue to improve back to baseline 3 L -Tapering down steroids -Consulted, satting 100% on 3 L, tapering down with O2 sat goal of 88-90%  Hypertension BP remained stable, -IV hydralazine as needed -On Lasix drip -We will continue metoprolol  HLD: -Stable on statins, Lipitor  Type II diabetes mellitus with renal manifestations and hyperglycemia ([place]): Hemoglobin A1c 6.7 on 03/30/2020  -Titrating -  Levemir -Checking CBG QA CHS, SSI  CKD-IIIB: -As above,  monitoring closely, improving Currently at her baseline creatinine 1.3 with GFR 48 Nephrology  Dr. Candiss Norse following, continue IV Lasix gtt >>> to be continued. 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 Consulted --- recommended no intervention at this time With concerning CT angiogram which cannot be completed due to worsening kidney function -Continue Eliquis -No changes     DVT ppx: Eliquis Code Status: Full code Family Communication: None at bedside.  Consults called:   Nephrology, vascular surgery,   Status is: Inpatient disc glasses something.  This could reduce the noise in people passing by Sherlynn Stalls   Dispo:  Patient From: [place]  Planned Disposition: [place]  Expected discharge date: 04/09/2020  Medically stable for discharge: No, due to ongoing management of acute on chronic diastolic CHF, ongoing diuresing.     Objective: Vitals:   04/07/20 0440 04/07/20 0755 04/07/20 1003 04/07/20 1119  BP: 129/66 122/69  120/65  Pulse: 75 75  75  Resp:  17  18  Temp: 97.8 F (36.6 C) (!) 97.3 F (36.3 C)  98.3 F (36.8 C)  TempSrc: Oral     SpO2: 100% 92% 99% 99%  Weight: 101.8 kg     Height:        Intake/Output Summary (Last 24 hours) at 04/07/2020 1220 Last data filed at 04/07/2020 0755 Gross per 24 hour  Intake --  Output 1800 ml  Net -1800 ml   Filed Weights   04/05/20 0450 04/06/20 0510 04/07/20 0440  Weight: 101.8 kg 101.4 kg 101.8 kg  Physical Exam General:  Alert, oriented, cooperative, no distress;   HEENT:  Normocephalic, PERRL, otherwise with in Normal limits   Neuro:  CNII-XII intact. , normal motor and sensation, reflexes intact   Lungs:   Clear to auscultation BL, Respirations unlabored, no wheezes /  crackles  Cardio:    S1/S2, RRR, No murmure, No Rubs or Gallops   Abdomen:   Soft, non-tender, bowel sounds active all four quadrants,  no guarding or peritoneal signs.  Muscular skeletal:   Severe generalized weaknesses-bedbound, able to move her extremities in the bed -, not motivated to get out of bed Limited exam - in bed, able to move all 4 extremities, amputated lower extremity toes, left hand downgraded fingers 2+ pulses,  symmetric, No pitting edema  Skin:  Dry, warm to touch, negative for any Rashes,     Wounds: Please see nursing documentation    lower ext. Amput. Gangrene fingers.  Negative for drainage, remained stable             Data Reviewed: CBC: Recent Labs  Lab 04/04/20 0506 04/05/20 0546 04/06/20 0550 04/07/20 0419  WBC 11.0* 7.2 8.5 11.8*  HGB 8.6* 8.0* 8.3* 8.3*  HCT 25.4* 23.9* 25.7* 24.5*  MCV 76.3* 76.8* 80.3 77.3*  PLT 294 231 246 412   Basic Metabolic Panel: Recent Labs  Lab 04/02/20 0629 04/02/20 0629 04/03/20 0526 04/04/20 0506 04/05/20 0546 04/06/20 0550 04/07/20 0419  NA 132*   < > 131* 130* 133* 134* 136  K 5.1   < > 5.1 5.1 5.8* 3.6 3.1*  CL 94*   < > 94* 96* 95* 92* 92*  CO2 24   < > 24 23 26 26 27   GLUCOSE 195*   < > 214* 226* 215* 213* 206*  BUN 31*   < > 45* 55* 60* 57*  57*  CREATININE 1.67*   < > 2.09* 2.30* 2.31* 2.08* 1.97*  CALCIUM 9.5   < > 9.1 9.1 9.3 9.4 9.4  MG 2.2  --   --   --   --   --   --   PHOS 4.6  --   --   --   --   --   --    < > = values in this interval not displayed.   GFR: Estimated Creatinine Clearance: 34.8 mL/min (A) (by C-G formula based on SCr of 1.97 mg/dL (H)). Liver Function Tests: Recent Labs  Lab 04/05/20 0546  ALBUMIN 3.8    No results for input(s): HGBA1C in the last 72 hours. CBG: Recent Labs  Lab 04/06/20 1146 04/06/20 1658 04/06/20 2322 04/07/20 0752 04/07/20 1203  GLUCAP 249* 297* 254* 136* 281*   Lipid Profile: No results for input(s): CHOL, HDL, LDLCALC, TRIG,  CHOLHDL, LDLDIRECT in the last 72 hours. Thyroid Function Tests: No results for input(s): TSH, T4TOTAL, FREET4, T3FREE, THYROIDAB in the last 72 hours. Anemia Panel: No results for input(s): VITAMINB12, FOLATE, FERRITIN, TIBC, IRON, RETICCTPCT in the last 72 hours. Urine analysis:    Component Value Date/Time   COLORURINE YELLOW (A) 04/04/2020 2140   APPEARANCEUR HAZY (A) 04/04/2020 2140   LABSPEC 1.008 04/04/2020 2140   PHURINE 5.0 04/04/2020 2140   GLUCOSEU NEGATIVE 04/04/2020 2140   HGBUR SMALL (A) 04/04/2020 2140   BILIRUBINUR NEGATIVE 04/04/2020 2140   KETONESUR NEGATIVE 04/04/2020 2140   PROTEINUR NEGATIVE 04/04/2020 2140   NITRITE NEGATIVE 04/04/2020 2140   LEUKOCYTESUR TRACE (A) 04/04/2020 2140   Sepsis Labs: @LABRCNTIP (procalcitonin:4,lacticidven:4) In Recent Results (from the past 240 hour(s))  SARS Coronavirus 2 by RT PCR (hospital order, performed in [facility] hospital lab) Nasopharyngeal Nasopharyngeal Swab     Status: None   Collection Time: 03/30/20  1:51 PM   Specimen: Nasopharyngeal Swab  Result Value Ref Range Status   SARS Coronavirus 2 NEGATIVE NEGATIVE Final    Comment: (NOTE) SARS-CoV-2 target nucleic acids are NOT DETECTED.  The SARS-CoV-2 RNA is generally detectable in upper and lower respiratory specimens during the acute phase of infection. The lowest concentration of SARS-CoV-2 viral copies this assay can detect is 250 copies / mL. A negative result does not preclude SARS-CoV-2 infection and should not be used as the sole basis for treatment or other patient management decisions.  A negative result may occur with improper specimen collection / handling, submission of specimen other than nasopharyngeal swab, presence of viral mutation(s) within the areas targeted by this assay, and inadequate number of viral copies (<250 copies / mL). A negative result must be combined with clinical observations, patient history, and epidemiological  information.  Fact Sheet for Patients:   StrictlyIdeas.no  Fact Sheet for Healthcare Providers: BankingDealers.co.za  This test is not yet approved or  cleared by the [place] FDA and has been authorized for detection and/or diagnosis of SARS-CoV-2 by FDA under an Emergency Use Authorization (EUA).  This EUA will remain in effect (meaning this test can be used) for the duration of the COVID-19 declaration under Section 564(b)(1) of the Act, 21 U.S.C. section 360bbb-3(b)(1), unless the authorization is terminated or revoked sooner.  Performed at [facility], [address] [place], [place] [address]   MRSA PCR Screening     Status: None   Collection Time: 03/30/20 10:23 PM   Specimen: Nasal Mucosa; Nasopharyngeal  Result Value Ref Range Status   MRSA by PCR  NEGATIVE NEGATIVE Final    Comment:        The GeneXpert MRSA Assay (FDA approved for NASAL specimens only), is one component of a comprehensive MRSA colonization surveillance program. It is not intended to diagnose MRSA infection nor to guide or monitor treatment for MRSA infections. Performed at [facility], [address] [place], [place] [address]       Studies: No results found.  Scheduled Meds: . amiodarone  200 mg Oral Daily  . apixaban  5 mg Oral BID  . aspirin EC  81 mg Oral Daily  . atorvastatin  20 mg Oral Daily  . docusate sodium  100 mg Oral BID  . feeding supplement (NEPRO CARB STEADY)  237 mL Oral BID BM  . ferrous sulfate  325 mg Oral Q breakfast  . gabapentin  100 mg Oral QID  . insulin aspart  0-5 Units Subcutaneous QHS  . insulin aspart  0-9 Units Subcutaneous TID WC  . insulin detemir  20 Units Subcutaneous QHS  . ipratropium-albuterol  3 mL Nebulization TID  . [START ON 04/08/2020] methylPREDNISolone (SOLU-MEDROL) injection  30 mg Intravenous Q24H  . metoprolol succinate  12.5 mg Oral Daily  . multivitamin  1  tablet Oral Daily  . multivitamin with minerals  1 tablet Oral Daily  . mupirocin cream   Topical BID  . pantoprazole  40 mg Oral Daily  . polyethylene glycol  17 g Oral Daily  . potassium chloride  20 mEq Oral BID  . sertraline  100 mg Oral Daily  . sodium chloride flush  3 mL Intravenous Q12H  . cyanocobalamin  1,000 mcg Oral Daily  . zinc sulfate  220 mg Oral Daily    Continuous Infusions: . sodium chloride    . albumin human 12.5 g (04/07/20 1007)  . furosemide (LASIX) infusion 8 mg/hr (04/06/20 1255)     LOS: 8 days     Deatra James, MD [facility] Pager [phone]  If 7PM-7AM, please contact night-coverage [email or web address] Password [facility] 04/07/2020, 12:20 PM

## 2020-04-08 LAB — CBC
HCT: 26.1 % — ABNORMAL LOW (ref 36.0–46.0)
Hemoglobin: 8.1 g/dL — ABNORMAL LOW (ref 12.0–15.0)
MCH: 25 pg — ABNORMAL LOW (ref 26.0–34.0)
MCHC: 31 g/dL (ref 30.0–36.0)
MCV: 80.6 fL (ref 80.0–100.0)
Platelets: 255 10*3/uL (ref 150–400)
RBC: 3.24 MIL/uL — ABNORMAL LOW (ref 3.87–5.11)
RDW: 21.7 % — ABNORMAL HIGH (ref 11.5–15.5)
WBC: 12.1 10*3/uL — ABNORMAL HIGH (ref 4.0–10.5)
nRBC: 0.8 % — ABNORMAL HIGH (ref 0.0–0.2)

## 2020-04-08 LAB — BASIC METABOLIC PANEL
Anion gap: 14 (ref 5–15)
BUN: 54 mg/dL — ABNORMAL HIGH (ref 8–23)
CO2: 30 mmol/L (ref 22–32)
Calcium: 9.5 mg/dL (ref 8.9–10.3)
Chloride: 93 mmol/L — ABNORMAL LOW (ref 98–111)
Creatinine, Ser: 1.77 mg/dL — ABNORMAL HIGH (ref 0.44–1.00)
GFR calc Af Amer: 35 mL/min — ABNORMAL LOW (ref >60)
GFR calc non Af Amer: 30 mL/min — ABNORMAL LOW (ref >60)
Glucose, Bld: 185 mg/dL — ABNORMAL HIGH (ref 70–99)
Potassium: 3 mmol/L — ABNORMAL LOW (ref 3.5–5.1)
Sodium: 137 mmol/L (ref 135–145)

## 2020-04-08 LAB — GLUCOSE, CAPILLARY
Glucose-Capillary: 248 mg/dL — ABNORMAL HIGH (ref 70–99)
Glucose-Capillary: 269 mg/dL — ABNORMAL HIGH (ref 70–99)
Glucose-Capillary: 253 mg/dL — ABNORMAL HIGH (ref 70–99)
Glucose-Capillary: 200 mg/dL — ABNORMAL HIGH (ref 70–99)

## 2020-04-08 LAB — PARATHYROID HORMONE, INTACT (NO CA): PTH: 123 pg/mL — ABNORMAL HIGH (ref 15–65)

## 2020-04-08 MED ORDER — POTASSIUM CHLORIDE CRYS ER 20 MEQ PO TBCR
40.0000 meq | EXTENDED_RELEASE_TABLET | Freq: Once | ORAL | Status: AC
  Administered 2020-04-08: 40 meq via ORAL
  Filled 2020-04-08: qty 2

## 2020-04-08 NOTE — Consult Note (Signed)
PHARMACY CONSULT NOTE - FOLLOW UP  Pharmacy Consult for Electrolyte Monitoring and Replacement   Recent Labs: Potassium (mmol/L)  Date Value  04/08/2020 3.0 (L)   Magnesium (mg/dL)  Date Value  04/02/2020 2.2   Calcium (mg/dL)  Date Value  04/08/2020 9.5   Albumin (g/dL)  Date Value  04/05/2020 3.8   Phosphorus (mg/dL)  Date Value  04/02/2020 4.6   Sodium (mmol/L)  Date Value  04/08/2020 137  06/14/2019 138     Assessment: 59 yo female with prolonged hospitalization on lasix drip and albumin with improving serum creatinine and continued electrolyte abnormalities.    Pharmacy has been consulted to monitor/replenish electrolytes.  Patient currently with standing order for KCl 66meq bid.  Goal of Therapy:  Electrolytes wnl's  Plan:  K = 3.0  Will give an additional 2meq KCl po x 1 in addition to scheduled dosing.  Will check Mg, K, and Phos with am labs.  Lu Duffel ,PharmD Clinical Pharmacist 04/08/2020 11:14 AM

## 2020-04-08 NOTE — Progress Notes (Signed)
Marland Kitchen   PROGRESS NOTE  Julie Bender TGG:[id number] DOB: 02-28-1961 DOA: 03/30/2020 PCP: Deanne Coffer, MD  HPI/Recap of past 24 hours: HPI: Julie Bender is a 59 y.o. 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  ED Course: BNP 1811, troponin 50, COVID-19 PCR negative, RR 25, oxygen sat 93% on 3 L oxygen currently, chest x-ray showed cardiomegaly and vascular congestion.    Brief Hospital course patient was subsequently admitted for acute respiratory failure due to CHF exacerbation volume overload anasarca COPD exacerbation.  Patient was aggressively diuresed, developed acute renal failure ----nephrologist was consulted, patient started on IV Lasix drip, repleted albumin. Patient subsequently responded well, diuresed well, kidney function continue to improve. Patient has extensive multiple comorbidities, recent cardiovascular surgery, lower extremity toe amputation, finger gangrene... Limited mobility, now bedbound.  Upon this admission PT/OT/social worker , Nephrology and Vascular was consulted. Patient is from rehab, will likely need long-term care upon discharge.  ---------------------------------------------------------------------------------------------------------------------------------------------  Subjective: The patient was seen and examined this morning,.  In no acute distress, slept on BiPAP last night -Per nursing staff back on 3 L satting 100%.  Patient is not motivated nursing staff report with any offer of movement or PT patient-hyperventilates becomes anxious--sometimes just  closes her eyes place lethargic Satting 99 on 3 L of oxygen  Remains on albumin and Lasix drip, diuresing well Nephrology following  ---------------------------------------------------------------------------------------------------------------------------------------------  Assessment/Plan: Principal Problem:   Acute on chronic diastolic CHF (congestive heart failure) (HCC) Active Problems:   Depression   Elevated troponin   Acute on chronic respiratory failure with hypoxia (HCC)   COPD (chronic obstructive pulmonary disease) ([place])   Hypertension   Type II diabetes mellitus with renal manifestations ([place])   CKD (chronic kidney disease), stage IIIa   Atrial fibrillation, chronic (HCC)   Acute on chronic respiratory failure with hypoxia   secondary to pulmonary Edema in the setting of acute on chronic diastolic CHF (congestive heart failure) (HCC) -anasarca  -Much improved, stable -BiPAP overnight, baseline 3 L currently satting 99% --anticipating to taper down further with RT Anticipating tapering down O2 demand O2 sat goal 88-92%  -2D echo on 05/21/2019 showed EF > 75% patient has a bilateral 2+ leg edema, crackles on auscultation, elevated BNP 1811, chest x-ray showed cardiomegaly and vascular congestion, suggestive of pulmonary edema  Acute renal failure in the setting of volume overload, CHF -Improving kidney function, good urine output -Nephrology Dr. Candiss Norse following, continue IV Lasix gtt >>>  -Albumin replacement  -Monitoring I's and O's, daily weights very closely (-2000, -200_ -Monitoring kidney function improving 2.3, 2.31, 2.08 >> 1.97, 1.77  today  Acute on chronic diastolic CHF -Continue to improve -Volume overload, elevated BNP, pulmonary edema -evident on CXR -Remains on Lasix drip 2D echo reviewed Net I&O -2000, -200  Elevated troponin  - Ischemic demand in setting of respiratory distress, CHF exacerbation  -Denies any chest pain Troponin S peaked at 53  and trended down. ..  -Continue aspirin, Lipitor, metoprolol -As needed nitroglycerin Dual paced rhythm on EKG  QTC prolongation/history of P A. fib -Asymptomatic heart rate controlled, Twelve-lead EKG, QTC greater than 500 Avoid QTC prolonging agents -Continue metoprolol, amiodarone, Eliquis   Depression -On  Zoloft -Still not very motivated, does not move -becomes anxious but patient is attempted to move out of bed or requesting PT  COPD (chronic obstructive pulmonary disease) ([place]):  -Management as above, O2 supplements, diuretics -Tapering down steroids -As needed nebs -Satting 99% on 3 L, tapering down further  Hypertension BP remained stable, -IV hydralazine as needed -On Lasix drip -We will continue metoprolol  HLD: -Stable on statins, Lipitor  Type II diabetes mellitus with renal manifestations and hyperglycemia ([place]): Hemoglobin A1c 6.7 on 03/30/2020  -Titrating -  Levemir -Monitoring blood sugar closely, continue checking CBG QA CHS, with SSI coverage  CKD-IIIB: -As above, monitoring closely, improving Currently at her baseline creatinine 1.3 with GFR 48 Nephrology Dr. Candiss Norse following, continue IV Lasix gtt >>> to be continued. Monitoring I's and O's, daily weights very closely Monitoring kidney function improving 2.3, 2.31, 2.08 >> 1.97 >> 1.77 today   Bilateral transmetatarsal amputation -dry gangrene left hand fingers With formed gangrene on left hand and fingers  Vasculare surg. Consulted --- recommended no intervention at this time With concerning CT angiogram which cannot be completed due to worsening kidney function -Continue Eliquis -No changes stable  Anemia of chronic disease -Monitoring H&H closely -No signs of bleeding  Severe debility -Needing assist with all ADLs, needing 2 person assist Hoyer lift with ambulation -PT/OT consulted -Recommending SNF    DVT ppx: Eliquis Code Status: Full code Family Communication: None at  bedside.  Consults called:   Nephrology, vascular surgery,   Status is: Inpatient disc glasses something.  This could reduce the noise in people passing by Sherlynn Stalls   Dispo:  Patient From: [facility]  Planned Disposition: [place] once stable -  Expected discharge date: 04/10/2020  Medically stable for discharge: No, due to ongoing management of acute on chronic diastolic CHF, ongoing diuresing.     Objective: Vitals:   04/08/20 0414 04/08/20 0925 04/08/20 0933 04/08/20 1121  BP: (!) 138/46  131/73 (!) 117/55  Pulse: 74 75 75 75  Resp:  14 20 19   Temp: 97.9 F (36.6 C)  97.6 F (36.4 C) 97.8 F (36.6 C)  TempSrc: Oral  Oral   SpO2: 96% (!) 79% 99% 99%  Weight: 101.7 kg     Height:        Intake/Output Summary (Last 24 hours) at 04/08/2020 1213 Last data filed at 04/08/2020 1121 Gross per 24 hour  Intake --  Output 1600 ml  Net -1600 ml     Physical Exam:   General:  Alert, oriented, cooperative, no distress;   HEENT:  Normocephalic, PERRL, otherwise with in Normal limits   Neuro:  CNII-XII intact. , normal motor and sensation, reflexes intact   Lungs:   Clear to auscultation BL, Respirations unlabored, improved wheezing, crackles, rhonchi  Cardio:    S1/S2, RRR, No murmure, No Rubs or Gallops   Abdomen:   Soft, non-tender, bowel sounds active all four quadrants,  no guarding or peritoneal signs.  Muscular skeletal:   Severe debility needing 2 person assist with ambulation, ADLs Limited exam - in bed, able to move all 4 extremities, amputated lower extremity toes, gangrene to left hand fingers 2+ pulses,  symmetric, No pitting edema  Skin:  Dry, warm to touch, negative for any Rashes, No open wounds    Wounds: Please see nursing documentation    lower ext. Amput. Gangrene fingers.  Negative for drainage, remained stable  Data Reviewed: CBC: Recent Labs  Lab 04/04/20 0506 04/05/20 0546 04/06/20 0550 04/07/20 0419  04/08/20 0535  WBC 11.0* 7.2 8.5 11.8* 12.1*  HGB 8.6* 8.0* 8.3* 8.3* 8.1*  HCT 25.4* 23.9* 25.7* 24.5* 26.1*  MCV 76.3* 76.8* 80.3 77.3* 80.6  PLT 294 231 246 249 151   Basic Metabolic Panel: Recent Labs  Lab 04/02/20 0629 04/03/20 0526 04/04/20 0506 04/05/20 0546 04/06/20 0550 04/07/20 0419 04/08/20 0535  NA 132*   < > 130* 133* 134* 136 137  K 5.1   < > 5.1 5.8* 3.6 3.1* 3.0*  CL 94*   < > 96* 95* 92* 92* 93*  CO2 24   < > 23 26 26 27 30   GLUCOSE 195*   < > 226* 215* 213* 206* 185*  BUN 31*   < > 55* 60* 57* 57* 54*  CREATININE 1.67*   < > 2.30* 2.31* 2.08* 1.97* 1.77*  CALCIUM 9.5   < > 9.1 9.3 9.4 9.4 9.5  MG 2.2  --   --   --   --   --   --   PHOS 4.6  --   --   --   --   --   --    < > = values in this interval not displayed.   GFR: Estimated Creatinine Clearance: 38.7 mL/min (A) (by C-G formula based on SCr of 1.77 mg/dL (H)). Liver Function Tests: Recent Labs  Lab 04/05/20 0546  ALBUMIN 3.8    No results for input(s): HGBA1C in the last 72 hours. CBG: Recent Labs  Lab 04/07/20 1203 04/07/20 1649 04/07/20 2124 04/08/20 0843 04/08/20 1121  GLUCAP 281* 367* 375* 248* 269*   Lipid Profile: No results for input(s): CHOL, HDL, LDLCALC, TRIG, CHOLHDL, LDLDIRECT in the last 72 hours. Thyroid Function Tests: No results for input(s): TSH, T4TOTAL, FREET4, T3FREE, THYROIDAB in the last 72 hours. Anemia Panel: No results for input(s): VITAMINB12, FOLATE, FERRITIN, TIBC, IRON, RETICCTPCT in the last 72 hours. Urine analysis:    Component Value Date/Time   COLORURINE YELLOW (A) 04/04/2020 2140   APPEARANCEUR HAZY (A) 04/04/2020 2140   LABSPEC 1.008 04/04/2020 2140   PHURINE 5.0 04/04/2020 2140   GLUCOSEU NEGATIVE 04/04/2020 2140   HGBUR SMALL (A) 04/04/2020 2140   BILIRUBINUR NEGATIVE 04/04/2020 2140   KETONESUR NEGATIVE 04/04/2020 2140   PROTEINUR NEGATIVE 04/04/2020 2140   NITRITE NEGATIVE 04/04/2020 2140   LEUKOCYTESUR TRACE (A) 04/04/2020 2140    Sepsis Labs: @LABRCNTIP (procalcitonin:4,lacticidven:4) In Recent Results (from the past 240 hour(s))  SARS Coronavirus 2 by RT PCR (hospital order, performed in [place] hospital lab) Nasopharyngeal Nasopharyngeal Swab     Status: None   Collection Time: 03/30/20  1:51 PM   Specimen: Nasopharyngeal Swab  Result Value Ref Range Status   SARS Coronavirus 2 NEGATIVE NEGATIVE Final    Comment: (NOTE) SARS-CoV-2 target nucleic acids are NOT DETECTED.  The SARS-CoV-2 RNA is generally detectable in upper and lower respiratory specimens during the acute phase of infection. The lowest concentration of SARS-CoV-2 viral copies this assay can detect is 250 copies / mL. A negative result does not preclude SARS-CoV-2 infection and should not be used as the sole basis for treatment or other patient management decisions.  A negative result may occur with improper specimen collection / handling, submission of specimen other than nasopharyngeal swab, presence of viral mutation(s) within the areas targeted by this assay, and inadequate number of viral copies (<250 copies / mL). A negative  result must be combined with clinical observations, patient history, and epidemiological information.  Fact Sheet for Patients:   StrictlyIdeas.no  Fact Sheet for Healthcare Providers: BankingDealers.co.za  This test is not yet approved or  cleared by the [place] FDA and has been authorized for detection and/or diagnosis of SARS-CoV-2 by FDA under an Emergency Use Authorization (EUA).  This EUA will remain in effect (meaning this test can be used) for the duration of the COVID-19 declaration under Section 564(b)(1) of the Act, 21 U.S.C. section 360bbb-3(b)(1), unless the authorization is terminated or revoked sooner.  Performed at [facility], [address] [place], [place] [address]   MRSA PCR Screening     Status: None   Collection Time:  03/30/20 10:23 PM   Specimen: Nasal Mucosa; Nasopharyngeal  Result Value Ref Range Status   MRSA by PCR NEGATIVE NEGATIVE Final    Comment:        The GeneXpert MRSA Assay (FDA approved for NASAL specimens only), is one component of a comprehensive MRSA colonization surveillance program. It is not intended to diagnose MRSA infection nor to guide or monitor treatment for MRSA infections. Performed at [facility], [address] [place],  [address]       Studies: No results found.  Scheduled Meds: . amiodarone  200 mg Oral Daily  . apixaban  5 mg Oral BID  . aspirin EC  81 mg Oral Daily  . atorvastatin  20 mg Oral Daily  . docusate sodium  100 mg Oral BID  . feeding supplement (NEPRO CARB STEADY)  237 mL Oral BID BM  . ferrous sulfate  325 mg Oral Q breakfast  . gabapentin  100 mg Oral QID  . insulin aspart  0-5 Units Subcutaneous QHS  . insulin aspart  0-9 Units Subcutaneous TID WC  . insulin detemir  20 Units Subcutaneous QHS  . ipratropium-albuterol  3 mL Nebulization TID  . methylPREDNISolone (SOLU-MEDROL) injection  30 mg Intravenous Q24H  . metoprolol succinate  12.5 mg Oral Daily  . multivitamin  1 tablet Oral Daily  . multivitamin with minerals  1 tablet Oral Daily  . mupirocin cream   Topical BID  . pantoprazole  40 mg Oral Daily  . polyethylene glycol  17 g Oral Daily  . potassium chloride  20 mEq Oral BID  . potassium chloride  40 mEq Oral Once  . sertraline  100 mg Oral Daily  . sodium chloride flush  3 mL Intravenous Q12H  . cyanocobalamin  1,000 mcg Oral Daily  . zinc sulfate  220 mg Oral Daily    Continuous Infusions: . sodium chloride 250 mL (04/07/20 2333)  . albumin human 12.5 g (04/08/20 1018)  . furosemide (LASIX) infusion 10 mg/hr (04/08/20 1011)     LOS: 9 days     Deatra James, MD [facility] Pager [phone]  If 7PM-7AM, please contact night-coverage [email or web address] Password [facility] 04/08/2020, 12:13  PM

## 2020-04-08 NOTE — Progress Notes (Addendum)
Mobility Specialist - Progress Note   04/08/20 1145  Mobility  Activity Dangled on edge of bed  Level of Assistance Dependent, patient does less than 25% (+2 assist)  Assistive Device None  Mobility Response Tolerated fair  Mobility performed by Mobility specialist  $Mobility charge 1 Mobility     Pre-mobility: 75 HR, 135/66 BP, 100% SpO2 Post-mobility: 77 HR, 141/62 BP, 97% SpO2   Pt asleep on CPAP upon arrival. Pt awakens after 2 attempts and agrees to mobility this AM. Nurse notified to remove CPAP to proceed w/ session. O2 maintained > 95%, pt proceeds mobility on RA. Pt dependent +2 assist supine-to-sit at EOB. Pt was lethargic t/o session, however, would respond to verbal cues and did try to participate. When sitting at EOB for ~5 minutes, pt desat to 88% RA. Pt quickly assisted by mobility specialists from sit to supine in bed. NT came into room and assisted w/ applying 2.5L O2 [place] on pt. Pt O2 sat > 92% after laying in bed w/ 2.5L O2 [place] for ~2 minutes. Overall, pt tolerated session fair. Pt left in bed utlizing 2.5L O2 [place] w/ call bell and phone placed in reach. Nurse was notified.    Sahand Gosch Mobility Specialist  04/08/20, 11:59 AM

## 2020-04-08 NOTE — Progress Notes (Signed)
Inpatient Diabetes Program Recommendations  AACE/ADA: New Consensus Statement on Inpatient Glycemic Control   Target Ranges:  Prepandial:   less than 140 mg/dL      Peak postprandial:   less than 180 mg/dL (1-2 hours)      Critically ill patients:  140 - 180 mg/dL   Results for TREESA, MCCULLY (MRN [id number]) as of 04/08/2020 11:45  Ref. Range 04/07/2020 07:52 04/07/2020 12:03 04/07/2020 16:49 04/07/2020 21:24 04/08/2020 08:43 04/08/2020 11:21  Glucose-Capillary Latest Ref Range: 70 - 99 mg/dL 136 (H) 281 (H) 367 (H) 375 (H) 248 (H) 269 (H)   Review of Glycemic Control  Current orders for Inpatient glycemic control: Levemir 20 units QHS, Novolog 0-9 units TID with meals, Novolog 0-5 units QHS; Solumedrol 30 mg Q24H  Inpatient Diabetes Program Recommendations:    Insulin-Meal Coverage: Post prandial glucose consistently elevated.  If steroids are continued, please consider ordering Novolog 6 units TID with meals for meal coverage if patient eats at least 50% of meals  Thanks, Barnie Alderman, RN, MSN, CDE Diabetes Coordinator Inpatient Diabetes Program [phone] (Team Pager from 8am to 5pm)

## 2020-04-08 NOTE — Progress Notes (Signed)
Nutrition Follow Up Note   DOCUMENTATION CODES:   Obesity unspecified  INTERVENTION:   Nepro Shake po BID, each supplement provides 425 kcal and 19 grams protein  MVI daily   NUTRITION DIAGNOSIS:   Inadequate oral intake related to acute illness as evidenced by per patient/family report.  GOAL:   Patient will meet greater than or equal to 90% of their needs  -progressing   MONITOR:   PO intake, Supplement acceptance, Labs, Weight trends, Skin, I & O's  ASSESSMENT:   59 y.o. female with medical history significant of hypertension, hyperlipidemia, diabetes mellitus, COPD on 3 L oxygen, asthma, depression, OSA, ICD placement, dCHF, HOCM s/p 09/2019 [facility] myectomy with a prolonged hospital course and multiple complications, CKD-III, A fib on Eliquis, who presents with shortness breath.   Met with pt in room today. Pt reports intermittent good appetite and oral intake in hospital. Pt eating lunch at time of RD visit today. Pt is documented to be eating anywhere from sips/bites to 100% of meals. Pt reports that she is drinking 2 Nepro supplements per day. Pt does require assistance with setting up her meal trays. Per chart, pt up ~2lbs since admit.    Medications reviewed and include: aspirin, colace, ferrous sulfate, lasix, insulin, solu-medrol, rena-vite, MVI, protonix, miralax, B12, zinc, KCl, albumin, lasix  Labs reviewed: K 3.0(L), BUN 54(H), creat 1.77(H) Wbc- 12.1(H), Hgb 8.1(L), Hct 26.1(L) cbgs- 248, 269 x 24 hrs AIC 6.7(H)- 8/15  Diet Order:   Diet Order            Diet Carb Modified Fluid consistency: Thin; Room service appropriate? 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  Estimated Nutritional Needs:   Kcal:  2000-2300kcal/day  Protein:  100-115g/day  Fluid:  UOP +1L  Koleen Distance MS, RD, LDN Please refer to [facility] for RD and/or RD on-call/weekend/after hours pager

## 2020-04-08 NOTE — Progress Notes (Signed)
[address] [place], [place] [address] Phone [phone]. Fax [phone]  Date: 04/08/2020                  Patient Name:  Julie Bender  MRN: [id number]  DOB: 05-23-1961  Age / Sex: 59 y.o., female         PCP: Deanne Coffer, MD                 Service Requesting Consult: IM/ Deatra James, MD                 Reason for Consult: ARF, edema            Subjective  Patient is lying in bed in no acute distress, on lasix infusion 8 mg/hr. She still has crackles at the bases and 2+peripheral edema. Dyspnea on exertion noted, has O2 via [place] 3L.   08/23 0701 - 08/24 0700 In: -  Out: 2000 [Urine:2000]  Lab Results  Component Value Date   CREATININE 1.77 (H) 04/08/2020   CREATININE 1.97 (H) 04/07/2020   CREATININE 2.08 (H) 04/06/2020      Vital Signs: Blood pressure (!) 117/55, pulse 75, temperature 97.8 F (36.6 C), resp. rate 19, height 5\' 4"  (1.626 m), weight 101.7 kg, SpO2 99 %.   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    Liver Function Tests: Recent Labs  Lab 04/05/20 0546  ALBUMIN 3.8   No results for input(s): LIPASE, AMYLASE in the last 168 hours. No results for input(s): AMMONIA in the last 168 hours.  CBC: Recent Labs  Lab 04/07/20 0419 04/08/20 0535  WBC 11.8* 12.1*  HGB 8.3* 8.1*  HCT 24.5* 26.1*  MCV 77.3* 80.6  PLT 249 255    Cardiac Enzymes: No results for input(s): CKTOTAL, TROPONINI in the last 168 hours.  BNP:  Invalid input(s): POCBNP  CBG: Recent Labs  Lab 04/07/20 1203 04/07/20 1649 04/07/20 2124 04/08/20 0843 04/08/20 1121  GLUCAP 281* 367* 375* 248* 269*    Microbiology: Recent Results (from the past 720 hour(s))  SARS Coronavirus 2 by RT PCR (hospital order, performed in [facility] hospital lab) Nasopharyngeal Nasopharyngeal Swab     Status: None   Collection Time: 03/30/20  1:51 PM   Specimen: Nasopharyngeal Swab  Result Value Ref Range Status   SARS Coronavirus 2 NEGATIVE NEGATIVE Final    Comment: (NOTE) SARS-CoV-2 target nucleic acids are NOT DETECTED.  The SARS-CoV-2 RNA is generally detectable in upper and lower respiratory specimens during the acute phase of infection. The lowest concentration of SARS-CoV-2 viral copies this assay can detect is 250 copies / mL. A negative result does not preclude SARS-CoV-2 infection and should not be used as the sole basis for treatment or other patient management decisions.  A negative result may occur with improper specimen collection / handling, submission of specimen other than nasopharyngeal swab, presence of viral mutation(s) within the areas targeted by this assay, and inadequate number of viral copies (<250 copies / mL). A negative result must be combined  with clinical observations, patient history, and epidemiological information.  Fact Sheet for Patients:   StrictlyIdeas.no  Fact Sheet for Healthcare Providers: BankingDealers.co.za  This test is not yet approved or  cleared by the [place] FDA and has been authorized for detection and/or diagnosis of SARS-CoV-2 by FDA under an Emergency Use Authorization (EUA).  This EUA will remain in effect (meaning this test can be used) for the duration of the COVID-19 declaration under Section 564(b)(1) of the Act, 21 U.S.C. section 360bbb-3(b)(1), unless the authorization is terminated or revoked sooner.  Performed at [facility], [address] [place], [place] [address]   MRSA PCR Screening     Status: None   Collection Time: 03/30/20 10:23 PM   Specimen: Nasal Mucosa; Nasopharyngeal  Result Value Ref Range Status   MRSA by PCR NEGATIVE NEGATIVE Final    Comment:        The GeneXpert MRSA Assay (FDA approved for NASAL specimens only), is one component of a comprehensive MRSA colonization surveillance program. It is not intended to diagnose MRSA infection nor to guide or monitor treatment for MRSA infections. Performed at [facility], [address] [place], [place] [address]      Coagulation Studies: No results for input(s): LABPROT, INR in the last 72 hours.  Urinalysis: No results for input(s): COLORURINE, LABSPEC, PHURINE, GLUCOSEU, HGBUR, BILIRUBINUR, KETONESUR, PROTEINUR, UROBILINOGEN, NITRITE, LEUKOCYTESUR in the last 72 hours.  Invalid input(s): APPERANCEUR    Scheduled Meds: . amiodarone  200 mg Oral Daily  . apixaban  5 mg Oral BID  . aspirin EC  81 mg Oral Daily  . atorvastatin  20 mg Oral Daily  . docusate sodium  100 mg Oral BID  . feeding supplement (NEPRO CARB STEADY)  237 mL Oral BID BM  . ferrous sulfate  325 mg Oral Q breakfast  . gabapentin  100 mg Oral QID  . insulin aspart   0-5 Units Subcutaneous QHS  . insulin aspart  0-9 Units Subcutaneous TID WC  . insulin detemir  20 Units Subcutaneous QHS  . ipratropium-albuterol  3 mL Nebulization TID  . methylPREDNISolone (SOLU-MEDROL) injection  30 mg Intravenous Q24H  . metoprolol succinate  12.5 mg Oral Daily  . multivitamin  1 tablet Oral Daily  . multivitamin with  minerals  1 tablet Oral Daily  . mupirocin cream   Topical BID  . pantoprazole  40 mg Oral Daily  . polyethylene glycol  17 g Oral Daily  . potassium chloride  20 mEq Oral BID  . potassium chloride  40 mEq Oral Once  . sertraline  100 mg Oral Daily  . sodium chloride flush  3 mL Intravenous Q12H  . cyanocobalamin  1,000 mcg Oral Daily  . zinc sulfate  220 mg Oral Daily   Continuous Infusions: . sodium chloride 250 mL (04/07/20 2333)  . albumin human 12.5 g (04/08/20 1018)  . furosemide (LASIX) infusion 10 mg/hr (04/08/20 1011)   PRN Meds:.sodium chloride, acetaminophen, albuterol, ALPRAZolam, dextromethorphan-guaiFENesin, hydrALAZINE, ondansetron (ZOFRAN) IV, oxyCODONE, sodium chloride flush    Imaging: No results found.   Assessment & Plan: Pt is a 59 y.o.   female with medical problems of HTN, HLD, DM, COPD, asthma, depression, OSA, ICD placement, diastolic CHF, h/o HOCM s/p myomectomy at [facility], prolonged hospitalization, CKD, A Fib , was admitted on 03/30/2020 with SOB (shortness of breath) [R06.02] Acute on chronic diastolic CHF (congestive heart failure) ([place]) [I50.33]   #Acute renal failure #Generalized edema #Chronic kidney disease stage III B.  Baseline creatinine 1.36, GFR 42-49 April 01, 2020 #Gangrenous changes bilateral hands, left worse than right # DM-2 with CKD # Grade 2 diastolic dysfunction # Hyperkalemia, now resolved  Lab Results  Component Value Date   HGBA1C 6.7 (H) 03/30/2020   Urinalysis and renal ultrasound are unremarkable  IV Furosemide increased to 10 mg/ hr  Reinforced fluid restriction      LOS:  9 Shameika Speelman 8/24/20212:08 PM

## 2020-04-09 ENCOUNTER — Inpatient Hospital Stay: Payer: Medicare Other

## 2020-04-09 ENCOUNTER — Inpatient Hospital Stay: Payer: Self-pay

## 2020-04-09 DIAGNOSIS — I10 Essential (primary) hypertension: Secondary | ICD-10-CM

## 2020-04-09 DIAGNOSIS — I5033 Acute on chronic diastolic (congestive) heart failure: Secondary | ICD-10-CM

## 2020-04-09 DIAGNOSIS — J9621 Acute and chronic respiratory failure with hypoxia: Secondary | ICD-10-CM

## 2020-04-09 HISTORY — PX: IR FLUORO GUIDE CV LINE LEFT: IMG2282

## 2020-04-09 LAB — BASIC METABOLIC PANEL
Anion gap: 11 (ref 5–15)
BUN: 50 mg/dL — ABNORMAL HIGH (ref 8–23)
CO2: 32 mmol/L (ref 22–32)
Calcium: 9.5 mg/dL (ref 8.9–10.3)
Chloride: 95 mmol/L — ABNORMAL LOW (ref 98–111)
Creatinine, Ser: 1.63 mg/dL — ABNORMAL HIGH (ref 0.44–1.00)
GFR calc Af Amer: 39 mL/min — ABNORMAL LOW (ref >60)
GFR calc non Af Amer: 34 mL/min — ABNORMAL LOW (ref >60)
Glucose, Bld: 110 mg/dL — ABNORMAL HIGH (ref 70–99)
Potassium: 3.8 mmol/L (ref 3.5–5.1)
Sodium: 138 mmol/L (ref 135–145)

## 2020-04-09 LAB — GLUCOSE, CAPILLARY
Glucose-Capillary: 102 mg/dL — ABNORMAL HIGH (ref 70–99)
Glucose-Capillary: 252 mg/dL — ABNORMAL HIGH (ref 70–99)
Glucose-Capillary: 177 mg/dL — ABNORMAL HIGH (ref 70–99)
Glucose-Capillary: 202 mg/dL — ABNORMAL HIGH (ref 70–99)

## 2020-04-09 LAB — PHOSPHORUS: Phosphorus: 2.7 mg/dL (ref 2.5–4.6)

## 2020-04-09 LAB — MAGNESIUM: Magnesium: 2 mg/dL (ref 1.7–2.4)

## 2020-04-09 MED ORDER — SODIUM CHLORIDE 0.9 % IV SOLN
0.3000 ug/kg | Freq: Once | INTRAVENOUS | Status: AC
  Administered 2020-04-09: 30.4 ug via INTRAVENOUS
  Filled 2020-04-09: qty 7.6

## 2020-04-09 MED ORDER — LIDOCAINE-EPINEPHRINE (PF) 1 %-1:200000 IJ SOLN
INTRAMUSCULAR | Status: AC
  Filled 2020-04-09: qty 10

## 2020-04-09 MED ORDER — LIDOCAINE HCL (PF) 1 % IJ SOLN
INTRAMUSCULAR | Status: AC
  Filled 2020-04-09: qty 30

## 2020-04-09 NOTE — Consult Note (Addendum)
PHARMACY CONSULT NOTE - FOLLOW UP  Pharmacy Consult for Electrolyte Monitoring and Replacement   Recent Labs: Potassium (mmol/L)  Date Value  04/09/2020 3.8   Magnesium (mg/dL)  Date Value  04/09/2020 2.0   Calcium (mg/dL)  Date Value  04/09/2020 9.5   Albumin (g/dL)  Date Value  04/05/2020 3.8   Phosphorus (mg/dL)  Date Value  04/09/2020 2.7   Sodium (mmol/L)  Date Value  04/09/2020 138  06/14/2019 138     Assessment: 59 yo female with prolonged hospitalization on lasix drip and albumin with improving serum creatinine and continued electrolyte abnormalities.    Pharmacy has been consulted to monitor/replenish electrolytes.  Patient currently with standing order for KCl 30meq bid on lasix drip  Renal function continuing to improve Scr 2.1>2.08>1.97>1.77>1.63  Electrolytes wnl's today  Goal of Therapy:  Electrolytes wnl's  Plan:  K = 3.8   - will continue standing order for KCl today  BMP with am labs  Lu Duffel ,PharmD Clinical Pharmacist 04/09/2020 8:27 AM

## 2020-04-09 NOTE — Progress Notes (Signed)
Patient at this time needs more access such as a Central line or possible PICC line. The patient is swollen with bruising and wounds to arms. Upon assessment for a Midline I was unable to identify an accessible vein due to fluid. I also assessed both bilateral forearms and was unable to find an appropriate vein due to edema. Rodman Key RN made aware of findings

## 2020-04-09 NOTE — Progress Notes (Signed)
[address] [place], [place] [address] Phone [phone]. Fax [phone]  Date: 04/09/2020                  Patient Name:  Julie Bender  MRN: [id number]  DOB: 02-01-1961  Age / Sex: 59 y.o., female         PCP: Deanne Coffer, MD                 Service Requesting Consult: IM/ Wyvonnia Dusky, MD                 Reason for Consult: ARF, edema            Subjective  Patient found resting in bed, in no acute distress,still requiring O2 supplementation via nasal canula.Peripheral edema is slightly better today. Patient is on Furosemide infusion 10 mg/hr.   08/24 0701 - 08/25 0700 In: 622.4 [I.V.:572.4; IV Piggyback:50] Out: 1500 [Urine:1500]  Vital Signs: Blood pressure 126/69, pulse 73, temperature 98.6 F (37 C), temperature source Oral, resp. rate 20, height 5\' 4"  (1.626 m), weight 101.7 kg, SpO2 93 %.   Intake/Output Summary (Last 24 hours) at 04/09/2020 1200 Last data filed at 04/09/2020 1941 Gross per 24 hour  Intake 622.41 ml  Output 1300 ml  Net -677.59 ml    Weight trends: Filed Weights   04/06/20 0510 04/07/20 0440 04/08/20 0414  Weight: 101.4 kg 101.8 kg 101.7 kg    Physical Exam: General:  In no acute distress  HEENT  Normocephalic, atraumatic,O2 3L via [place]  Lungs:  Bibasilar crackles +  Heart::  Regular rate and rhythm,no rub or gallop  Abdomen:  Non distended, Non tender,soft,obese  Extremities:  2+ pitting edema, b/l partial foot amputations  Neurologic:  Alert and oriented x 3  Skin:  Warm, dry, no acute lesions, has chronic wounds  Foley:  Urinary collecting system is in place with dark amber urine       Lab results: Basic Metabolic Panel: Recent Labs  Lab 04/07/20 0419 04/08/20 0535 04/09/20 0505  NA 136 137 138  K 3.1* 3.0* 3.8  CL 92* 93* 95*  CO2 27 30 32  GLUCOSE 206* 185* 110*  BUN 57* 54* 50*  CREATININE 1.97* 1.77* 1.63*  CALCIUM 9.4 9.5 9.5  MG  --   --  2.0  PHOS  --   --  2.7    Liver Function  Tests: Recent Labs  Lab 04/05/20 0546  ALBUMIN 3.8   No results for input(s): LIPASE, AMYLASE in the last 168 hours. No results for input(s): AMMONIA in the last 168 hours.  CBC: Recent Labs  Lab 04/07/20 0419 04/08/20 0535  WBC 11.8* 12.1*  HGB 8.3* 8.1*  HCT 24.5* 26.1*  MCV 77.3* 80.6  PLT 249 255    Cardiac Enzymes: No results for input(s): CKTOTAL, TROPONINI in the last 168 hours.  BNP: Invalid input(s): POCBNP  CBG: Recent Labs  Lab 04/08/20 1121 04/08/20 1749 04/08/20 2131 04/09/20 0833 04/09/20 1132  GLUCAP 269* 253* 200* 102* 252*    Microbiology: Recent Results (from the past 720 hour(s))  SARS Coronavirus 2 by RT PCR (hospital order, performed in [facility] hospital lab) Nasopharyngeal Nasopharyngeal Swab     Status: None   Collection Time: 03/30/20  1:51 PM   Specimen: Nasopharyngeal Swab  Result Value Ref Range Status   SARS Coronavirus 2 NEGATIVE NEGATIVE Final    Comment: (NOTE) SARS-CoV-2 target nucleic acids are NOT DETECTED.  The SARS-CoV-2 RNA  is generally detectable in upper and lower respiratory specimens during the acute phase of infection. The lowest concentration of SARS-CoV-2 viral copies this assay can detect is 250 copies / mL. A negative result does not preclude SARS-CoV-2 infection and should not be used as the sole basis for treatment or other patient management decisions.  A negative result may occur with improper specimen collection / handling, submission of specimen other than nasopharyngeal swab, presence of viral mutation(s) within the areas targeted by this assay, and inadequate number of viral copies (<250 copies / mL). A negative result must be combined with clinical observations, patient history, and epidemiological information.  Fact Sheet for Patients:   StrictlyIdeas.no  Fact Sheet for Healthcare Providers: BankingDealers.co.za  This test is not yet approved or   cleared by the [place] FDA and has been authorized for detection and/or diagnosis of SARS-CoV-2 by FDA under an Emergency Use Authorization (EUA).  This EUA will remain in effect (meaning this test can be used) for the duration of the COVID-19 declaration under Section 564(b)(1) of the Act, 21 U.S.C. section 360bbb-3(b)(1), unless the authorization is terminated or revoked sooner.  Performed at [facility], [address] [place], [place] [address]   MRSA PCR Screening     Status: None   Collection Time: 03/30/20 10:23 PM   Specimen: Nasal Mucosa; Nasopharyngeal  Result Value Ref Range Status   MRSA by PCR NEGATIVE NEGATIVE Final    Comment:        The GeneXpert MRSA Assay (FDA approved for NASAL specimens only), is one component of a comprehensive MRSA colonization surveillance program. It is not intended to diagnose MRSA infection nor to guide or monitor treatment for MRSA infections. Performed at [facility], [address] [place],  [address]      Coagulation Studies: No results for input(s): LABPROT, INR in the last 72 hours.  Urinalysis: No results for input(s): COLORURINE, LABSPEC, PHURINE, GLUCOSEU, HGBUR, BILIRUBINUR, KETONESUR, PROTEINUR, UROBILINOGEN, NITRITE, LEUKOCYTESUR in the last 72 hours.  Invalid input(s): APPERANCEUR    Scheduled Meds: . amiodarone  200 mg Oral Daily  . apixaban  5 mg Oral BID  . aspirin EC  81 mg Oral Daily  . atorvastatin  20 mg Oral Daily  . docusate sodium  100 mg Oral BID  . feeding supplement (NEPRO CARB STEADY)  237 mL Oral BID BM  . ferrous sulfate  325 mg Oral Q breakfast  . gabapentin  100 mg Oral QID  . insulin aspart  0-5 Units Subcutaneous QHS  . insulin aspart  0-9 Units Subcutaneous TID WC  . insulin detemir  20 Units Subcutaneous QHS  . ipratropium-albuterol  3 mL Nebulization TID  . methylPREDNISolone (SOLU-MEDROL) injection  30 mg Intravenous Q24H  . metoprolol succinate   12.5 mg Oral Daily  . multivitamin  1 tablet Oral Daily  . multivitamin with minerals  1 tablet Oral Daily  . mupirocin cream   Topical BID  . pantoprazole  40 mg Oral Daily  . polyethylene glycol  17 g Oral Daily  . potassium chloride  20 mEq Oral BID  . sertraline  100 mg Oral Daily  . sodium chloride flush  3 mL Intravenous Q12H  . cyanocobalamin  1,000 mcg Oral Daily  . zinc sulfate  220 mg Oral Daily   Continuous Infusions: . sodium chloride 250 mL (04/07/20 2333)  . albumin human 12.5 g (04/08/20 2159)  . furosemide (LASIX) infusion Stopped (04/09/20 0930)  PRN Meds:.sodium chloride, acetaminophen, albuterol, ALPRAZolam, dextromethorphan-guaiFENesin, hydrALAZINE, ondansetron (ZOFRAN) IV, oxyCODONE, sodium chloride flush    Imaging: [place] EKG SITE RITE  Result Date: 04/09/2020 If Site Rite image not attached, placement could not be confirmed due to current cardiac rhythm.    Assessment & Plan: Pt is a 59 y.o.   female with medical problems of HTN, HLD, DM, COPD, asthma, depression, OSA, ICD placement, diastolic CHF, h/o HOCM s/p myomectomy at [facility], prolonged hospitalization, CKD, A Fib , was admitted on 03/30/2020 with SOB (shortness of breath) [R06.02] Acute on chronic diastolic CHF (congestive heart failure) ([place]) [I50.33]   #Acute renal failure #Generalized edema #Chronic kidney disease stage III B.  Baseline creatinine 1.36, GFR 42-49 April 01, 2020 #Gangrenous changes bilateral hands, left worse than right # DM-2 with CKD # Grade 2 diastolic dysfunction # Hyperkalemia, now resolved  Lab Results  Component Value Date   HGBA1C 6.7 (H) 03/30/2020   No new weight reading today.  Patient has been very slow to respond to treatment.  Therefore at this time we will continue Lasix drip at 10 mg/h.  Hopefully her urine output will increase a bit.  As before counseled the patient on fluid restriction.      LOS: 10 Gwendloyn Forsee 8/25/202112:00 PM

## 2020-04-09 NOTE — Progress Notes (Addendum)
PROGRESS NOTE    Julie Bender  BMW:[id number] DOB: 05-Dec-1960 DOA: 03/30/2020 PCP: Deanne Coffer, MD  Assessment & Plan:   Principal Problem:   Acute on chronic diastolic CHF (congestive heart failure) ([place]) Active Problems:   Depression   Elevated troponin   Acute on chronic respiratory failure with hypoxia (HCC)   COPD (chronic obstructive pulmonary disease) ([place])   Hypertension   Type II diabetes mellitus with renal manifestations ([place])   CKD (chronic kidney disease), stage IIIa   Atrial fibrillation, chronic (HCC)   Acute on chronic hypoxic respiratory failure: secondary to pulmonary edema in setting of acute on chronic diastolic CHF exacerbation. Continue on supplemental oxygen.    Acute on chronic diastolic CHF exacerbation: continue on IV lasix drip. Monitor I/Os. Echo shows EF 55-60%, grade II diastolic dysfunction   AKI on CKDIIIb : Cr continues to trend down daily. 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 No intervention at this time as per vascular surg. Continue Eliquis. Present on admission. Etiology unclear, ? previously on pressors   Leukocytosis: etiology unclear, reactive vs infection. Will continue to monitor    Elevated troponin: likely secondary to demand ischemia.   QT prolongation: w/ hx of PAF. Continue on metoprolol, amiodarone & eliquis.   Depression: severity unknown. Continue on home dose of zoloft   COPD:continue on steroid taper & bronchodilators. Continue on supplemental oxygen. Encourage incentive spirometry   Hypertension: continue on metoprolol, lasix. IV hydralazine prn   HLD: continue on statin   DM2: HbA1c 6.7 on 03/30/2020. Continue on levemir & SSI w/ accuchecks    ACD: H&H are stable. No need for a transfusion at this time. Will continue to monitor   Severe debility: PT/OT recs SNF    DVT prophylaxis: eliquis  Code Status: full  Family Communication:  Disposition  Plan:    Status is: Inpatient  Remains inpatient appropriate because:IV treatments appropriate due to intensity of illness or inability to take PO   Dispo:  Patient From: [facility]  Planned Disposition: [place]  Expected discharge date: in >3 days  Medically stable for discharge: No      Consultants:   Vascular surg   nephro    Procedures:    Antimicrobials:    Subjective: Pt c/o malaise   Objective: Vitals:   04/08/20 2207 04/09/20 0211 04/09/20 0418 04/09/20 0807  BP:   126/69   Pulse:  75 73   Resp:  18 20   Temp:   98.6 F (37 C)   TempSrc:   Oral   SpO2: 98% 98% 100% 93%  Weight:      Height:        Intake/Output Summary (Last 24 hours) at 04/09/2020 0828 Last data filed at 04/09/2020 2725 Gross per 24 hour  Intake 622.41 ml  Output 1500 ml  Net -877.59 ml   Filed Weights   04/06/20 0510 04/07/20 0440 04/08/20 0414  Weight: 101.4 kg 101.8 kg 101.7 kg    Examination:  General exam: Appears lethargic  Respiratory system: diminished breath sounds b/l. No wheezes Cardiovascular system: S1 & S2 +. No rubs, gallops or clicks.  Gastrointestinal system: Abdomen is obese, soft and nontender. Hypoactive bowel sounds heard. Central nervous system: lethargic. Moves all 4 extremities  Skin: dry gangrene on fingers on both hands Psychiatry: Judgement and insight appear abnormal. Flat mood and affect     Data Reviewed: I have personally reviewed following labs and  imaging studies  CBC: Recent Labs  Lab 04/04/20 0506 04/05/20 0546 04/06/20 0550 04/07/20 0419 04/08/20 0535  WBC 11.0* 7.2 8.5 11.8* 12.1*  HGB 8.6* 8.0* 8.3* 8.3* 8.1*  HCT 25.4* 23.9* 25.7* 24.5* 26.1*  MCV 76.3* 76.8* 80.3 77.3* 80.6  PLT 294 231 246 249 357   Basic Metabolic Panel: Recent Labs  Lab 04/05/20 0546 04/06/20 0550 04/07/20 0419 04/08/20 0535 04/09/20 0505  NA 133* 134* 136 137 138  K 5.8* 3.6 3.1* 3.0* 3.8  CL 95* 92* 92*  93* 95*  CO2 26 26 27 30  32  GLUCOSE 215* 213* 206* 185* 110*  BUN 60* 57* 57* 54* 50*  CREATININE 2.31* 2.08* 1.97* 1.77* 1.63*  CALCIUM 9.3 9.4 9.4 9.5 9.5  MG  --   --   --   --  2.0  PHOS  --   --   --   --  2.7   GFR: Estimated Creatinine Clearance: 42.1 mL/min (A) (by C-G formula based on SCr of 1.63 mg/dL (H)). Liver Function Tests: Recent Labs  Lab 04/05/20 0546  ALBUMIN 3.8   No results for input(s): LIPASE, AMYLASE in the last 168 hours. No results for input(s): AMMONIA in the last 168 hours. Coagulation Profile: No results for input(s): INR, PROTIME in the last 168 hours. Cardiac Enzymes: No results for input(s): CKTOTAL, CKMB, CKMBINDEX, TROPONINI in the last 168 hours. BNP (last 3 results) No results for input(s): PROBNP in the last 8760 hours. HbA1C: No results for input(s): HGBA1C in the last 72 hours. CBG: Recent Labs  Lab 04/07/20 2124 04/08/20 0843 04/08/20 1121 04/08/20 1749 04/08/20 2131  GLUCAP 375* 248* 269* 253* 200*   Lipid Profile: No results for input(s): CHOL, HDL, LDLCALC, TRIG, CHOLHDL, LDLDIRECT in the last 72 hours. Thyroid Function Tests: No results for input(s): TSH, T4TOTAL, FREET4, T3FREE, THYROIDAB in the last 72 hours. Anemia Panel: No results for input(s): VITAMINB12, FOLATE, FERRITIN, TIBC, IRON, RETICCTPCT in the last 72 hours. Sepsis Labs: No results for input(s): PROCALCITON, LATICACIDVEN in the last 168 hours.  Recent Results (from the past 240 hour(s))  SARS Coronavirus 2 by RT PCR (hospital order, performed in [facility] hospital lab) Nasopharyngeal Nasopharyngeal Swab     Status: None   Collection Time: 03/30/20  1:51 PM   Specimen: Nasopharyngeal Swab  Result Value Ref Range Status   SARS Coronavirus 2 NEGATIVE NEGATIVE Final    Comment: (NOTE) SARS-CoV-2 target nucleic acids are NOT DETECTED.  The SARS-CoV-2 RNA is generally detectable in upper and lower respiratory specimens during the acute phase of infection.  The lowest concentration of SARS-CoV-2 viral copies this assay can detect is 250 copies / mL. A negative result does not preclude SARS-CoV-2 infection and should not be used as the sole basis for treatment or other patient management decisions.  A negative result may occur with improper specimen collection / handling, submission of specimen other than nasopharyngeal swab, presence of viral mutation(s) within the areas targeted by this assay, and inadequate number of viral copies (<250 copies / mL). A negative result must be combined with clinical observations, patient history, and epidemiological information.  Fact Sheet for Patients:   StrictlyIdeas.no  Fact Sheet for Healthcare Providers: BankingDealers.co.za  This test is not yet approved or  cleared by the [place] FDA and has been authorized for detection and/or diagnosis of SARS-CoV-2 by FDA under an Emergency Use Authorization (EUA).  This EUA will remain in effect (meaning this test can be used)  for the duration of the COVID-19 declaration under Section 564(b)(1) of the Act, 21 U.S.C. section 360bbb-3(b)(1), unless the authorization is terminated or revoked sooner.  Performed at [facility], [address] [place], [place] [address]   MRSA PCR Screening     Status: None   Collection Time: 03/30/20 10:23 PM   Specimen: Nasal Mucosa; Nasopharyngeal  Result Value Ref Range Status   MRSA by PCR NEGATIVE NEGATIVE Final    Comment:        The GeneXpert MRSA Assay (FDA approved for NASAL specimens only), is one component of a comprehensive MRSA colonization surveillance program. It is not intended to diagnose MRSA infection nor to guide or monitor treatment for MRSA infections. Performed at [facility], [address] [place], [place] [address]          Radiology Studies: No results found.      Scheduled Meds: . amiodarone  200 mg  Oral Daily  . apixaban  5 mg Oral BID  . aspirin EC  81 mg Oral Daily  . atorvastatin  20 mg Oral Daily  . docusate sodium  100 mg Oral BID  . feeding supplement (NEPRO CARB STEADY)  237 mL Oral BID BM  . ferrous sulfate  325 mg Oral Q breakfast  . gabapentin  100 mg Oral QID  . insulin aspart  0-5 Units Subcutaneous QHS  . insulin aspart  0-9 Units Subcutaneous TID WC  . insulin detemir  20 Units Subcutaneous QHS  . ipratropium-albuterol  3 mL Nebulization TID  . methylPREDNISolone (SOLU-MEDROL) injection  30 mg Intravenous Q24H  . metoprolol succinate  12.5 mg Oral Daily  . multivitamin  1 tablet Oral Daily  . multivitamin with minerals  1 tablet Oral Daily  . mupirocin cream   Topical BID  . pantoprazole  40 mg Oral Daily  . polyethylene glycol  17 g Oral Daily  . potassium chloride  20 mEq Oral BID  . sertraline  100 mg Oral Daily  . sodium chloride flush  3 mL Intravenous Q12H  . cyanocobalamin  1,000 mcg Oral Daily  . zinc sulfate  220 mg Oral Daily   Continuous Infusions: . sodium chloride 250 mL (04/07/20 2333)  . albumin human 12.5 g (04/08/20 2159)  . furosemide (LASIX) infusion 10 mg/hr (04/09/20 0549)     LOS: 10 days    Time spent: 33 mins    Wyvonnia Dusky, MD [facility] Pager [phone]  If 7PM-7AM, please contact night-coverage [email or web address] 04/09/2020, 8:28 AM

## 2020-04-09 NOTE — Progress Notes (Signed)
     Referral received for Julie Bender :goals of care discussion. Chart reviewed and updates received from RN. Patient assessed and expressed wishes not to in discussions at this time. She is requesting for me to return tomorrow stating she is resting and is tired. Offered to speak with family and provide updates however, patient refused and again expressed wishes to revisit tomorrow.   RN made aware of patient's request.    PMT will re-attempt to engage in [place] discussion with Mrs. Jarriel at a later time/date. Detailed note and recommendations to follow once GOC has been completed.   Thank you for your referral and allowing PMT to assist in Mrs. Hardin Negus care.   Alda Lea, AGPCNP-BC Palliative Medicine Team  Phone: [phone] Pager: [phone] Amion: N. [place]   NO CHARGE

## 2020-04-09 NOTE — Progress Notes (Signed)
Mobility Specialist - Progress Note   04/09/20 1300  Mobility  Activity Dangled on edge of bed  Level of Assistance Dependent, patient does less than 25% (+2 assist)  Assistive Device None  Mobility Response Tolerated fair  Mobility performed by Mobility specialist  $Mobility charge 1 Mobility    Pre-mobility: 74 HR, 124/68 BP, 100% SpO2 During mobility: 75 HR, 97% SpO2 Post-mobility: 74 HR, 118/49 BP, 100% SpO2   Pt was sleeping in bed upon arrival. Pt easily awakens and agrees to mobility session. Pt would go in/out of sleep, but can be easily awakened. Pt states she was having 7/10 pain in her hands and 9/10 pain in her feet. However, willing to proceed w/ session. Pt was dependent +2 assist getting EOB, needing total assist w/ balance in holding her upper body upright. Noticed pt has L lateral lean when sitting EOB. Pt did not c/o dizziness or lightheadedness. She c/o pain in her right leg, rating it 6/10. Pt completed seated exercises when sitting EOB (ankle pumps x 10/leg, seated kicks x 10/leg, seated marches x 10/ leg). Pt min. Assist w/ LE mobility. Overall, pt tolerated session fair. She was quite lethargic, but could stay awake for most of the session. Pt was left in bed w/ call bell and phone placed in reach.    Nate Perri Mobility Specialist  04/09/20, 1:06 PM

## 2020-04-09 NOTE — Progress Notes (Signed)
Inpatient Diabetes Program Recommendations  AACE/ADA: New Consensus Statement on Inpatient Glycemic Control   Target Ranges:  Prepandial:   less than 140 mg/dL      Peak postprandial:   less than 180 mg/dL (1-2 hours)      Critically ill patients:  140 - 180 mg/dL   Results for Julie Bender, Julie Bender (MRN [id number]) as of 04/09/2020 11:42  Ref. Range 04/08/2020 08:43 04/08/2020 11:21 04/08/2020 17:49 04/08/2020 21:31 04/09/2020 08:33 04/09/2020 11:32  Glucose-Capillary Latest Ref Range: 70 - 99 mg/dL 248 (H) 269 (H) 253 (H) 200 (H) 102 (H) 252 (H)   Review of Glycemic Control  Current orders for Inpatient glycemic control:Levemir 20 units QHS, Novolog 0-9 units TID with meals, Novolog 0-5 units QHS; Solumedrol 30 mg Q24H  Inpatient Diabetes Program Recommendations:  Insulin-Meal Coverage: Post prandial glucose consistently elevated. If steroids are continued, please consider ordering Novolog4units TID with meals for meal coverage if patient eats at least 50% of meals  Thanks, Barnie Alderman, RN, MSN, CDE Diabetes Coordinator Inpatient Diabetes Program [phone] (Team Pager from 8am to [place])

## 2020-04-10 DIAGNOSIS — Z515 Encounter for palliative care: Secondary | ICD-10-CM

## 2020-04-10 DIAGNOSIS — N179 Acute kidney failure, unspecified: Secondary | ICD-10-CM

## 2020-04-10 DIAGNOSIS — Z66 Do not resuscitate: Secondary | ICD-10-CM

## 2020-04-10 DIAGNOSIS — Z7189 Other specified counseling: Secondary | ICD-10-CM

## 2020-04-10 LAB — BASIC METABOLIC PANEL
Anion gap: 12 (ref 5–15)
BUN: 47 mg/dL — ABNORMAL HIGH (ref 8–23)
CO2: 31 mmol/L (ref 22–32)
Calcium: 9.6 mg/dL (ref 8.9–10.3)
Chloride: 95 mmol/L — ABNORMAL LOW (ref 98–111)
Creatinine, Ser: 1.43 mg/dL — ABNORMAL HIGH (ref 0.44–1.00)
GFR calc Af Amer: 46 mL/min — ABNORMAL LOW (ref >60)
GFR calc non Af Amer: 39 mL/min — ABNORMAL LOW (ref >60)
Glucose, Bld: 181 mg/dL — ABNORMAL HIGH (ref 70–99)
Potassium: 4.1 mmol/L (ref 3.5–5.1)
Sodium: 138 mmol/L (ref 135–145)

## 2020-04-10 LAB — CBC
HCT: 25.8 % — ABNORMAL LOW (ref 36.0–46.0)
Hemoglobin: 8.1 g/dL — ABNORMAL LOW (ref 12.0–15.0)
MCH: 25.6 pg — ABNORMAL LOW (ref 26.0–34.0)
MCHC: 31.4 g/dL (ref 30.0–36.0)
MCV: 81.4 fL (ref 80.0–100.0)
Platelets: 262 10*3/uL (ref 150–400)
RBC: 3.17 MIL/uL — ABNORMAL LOW (ref 3.87–5.11)
RDW: 21.6 % — ABNORMAL HIGH (ref 11.5–15.5)
WBC: 9.3 10*3/uL (ref 4.0–10.5)
nRBC: 0.6 % — ABNORMAL HIGH (ref 0.0–0.2)

## 2020-04-10 LAB — GLUCOSE, CAPILLARY
Glucose-Capillary: 181 mg/dL — ABNORMAL HIGH (ref 70–99)
Glucose-Capillary: 297 mg/dL — ABNORMAL HIGH (ref 70–99)
Glucose-Capillary: 360 mg/dL — ABNORMAL HIGH (ref 70–99)
Glucose-Capillary: 349 mg/dL — ABNORMAL HIGH (ref 70–99)

## 2020-04-10 MED ORDER — METHYLPREDNISOLONE SODIUM SUCC 40 MG IJ SOLR
20.0000 mg | INTRAMUSCULAR | Status: DC
  Administered 2020-04-11 – 2020-04-17 (×7): 20 mg via INTRAVENOUS
  Filled 2020-04-10 (×8): qty 1

## 2020-04-10 MED ORDER — POTASSIUM CHLORIDE CRYS ER 20 MEQ PO TBCR
20.0000 meq | EXTENDED_RELEASE_TABLET | Freq: Every day | ORAL | Status: DC
  Administered 2020-04-10 – 2020-04-18 (×9): 20 meq via ORAL
  Filled 2020-04-10 (×9): qty 1

## 2020-04-10 NOTE — Consult Note (Signed)
Consultation Note Date: 04/10/2020   Patient Name: Julie Bender  DOB: July 11, 1961  MRN: [id number]  Age / Sex: 59 y.o., female   PCP: Deanne Coffer, MD Referring Physician: Wyvonnia Dusky, MD   REASON FOR CONSULTATION:Establishing goals of care  Palliative Care consult requested for goals of care discussion in this 59 y.o. female with multiple medical problems including hypertension, hyperlipidemia, diabetes mellitus, COPD on 3 L oxygen, asthma, depression, OSA, ICD placement, dCHF (EF 55-60%), HOCM s/p 09/2019 Dukemyectomy with a prolonged hospital, CKD-III, and atrial fibrillation (Eliquis). Patient presented to ED from [facility] facility with complaints of shortness of breath. Since admission patient requiring IV lasix drip. Patient evaluated by vascular surgery regarding bilateral hand/fingers with dry gangrenous changes. Recommendations for conservative treatment with no interventions at this time. She is also being followed by Nephrology.    Clinical Assessment and Goals of Care: I have reviewed medical records including lab results, imaging, Epic notes, and MAR, received report from the bedside RN, and assessed the patient. I met at the bedside with Julie Bender to discuss diagnosis prognosis, [place], EOL wishes, disposition and options.  Patient is awake, alert and oriented x3. Denies pain or shortness of breath at this time.   I introduced Palliative Medicine as specialized medical care for people living with serious illness. It focuses on providing relief from the symptoms and stress of a serious illness. The goal is to improve quality of life for both the patient and the family. She verbalized understanding.   We discussed a brief life review of the patient, along with her functional and nutritional status. Patient reports she has been divorced for many years. She has 3 children however, she shares they do not have the best relationship. She has 2 sisters she relies on for  some support. 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 She has been in a facility since February 2021, however prior to that she reports living alone in her home in [place].   Prior to admission patient reports ability to ambulate without assistive devices. She states her appetite is up and down depending on how she is feeling. Denies weight loss or difficulty when eating. Complains of generalized pain.   We discussed Her current illness and what it means in the larger context of Her on-going co-morbidities. With specific discussions regarding CKD, gangrenous hand changes, CHF, and her overall functional state. Natural disease trajectory and expectations at EOL were discussed.  Julie Bender verbalizes her understanding and co-morbidities. She is tearful when showing both hands and gangrenous areas. She reports after her Myomectomy at [facility] she woke up 2 months later and her hands appeared as such. She speaks to her difficulty during that time however, her appreciation of her somewhat recovery.   We discussed at length her co-morbidities including deconditioning and renal failure. Patient reports she is unsure she would ever wish to under go dialysis and would need to consider strongly if that time was to come.   I attempted to elicit values and goals of care important to the patient.    Monetta expresses her quality of life was good prior to February and she is remaining hopeful she will eventually return to her home and be able to somewhat care for herself with minimal assistance. Education provided on the realistic aspect of this with consideration of her co-morbidities. Patient verbalized understanding sharing she has limited support from family. She shares her sister is  sick and unable to be available as she did in the past. Therapeutic listening and support provided.   Advanced directives, concepts specific to code status, artifical feeding  and hydration, and rehospitalization were considered and discussed. Patient does have a documented advanced directive which has been completed and placed in her Epic chart. Julie Bender confirms that her sister Julie Bender is her designated Scientist, research (medical).   I created space and opportunity to further discuss patient's full code status with consideration of her current illness and co-morbidities. Julie Bender reports her awareness of chronic illness and somewhat poor prognosis. She states she would not want to receive any life-prolonging measures.   Detailed education provided regarding full code/aggresive care in an emergent event versus comfort and DNR/DNI. Julie Bender verbalized understanding confirming wishes to continue to treat the treatable but no life-sustaining measures. She is requesting DNR/DNI. Confirmed her decision and education provided on what a DNR/DNI would look like and RN to place bracelet confirming her wishes. Julie Bender verbalized understanding and appreciation .   Palliative Care services outpatient were explained and offered in the setting of patient's expressed and clear wishes to continue to treat the treatable. Patient verbalized her understanding and awareness of palliative's goals and philosophy of care. She is requesting outpatient palliative support at discharge.   Questions and concerns were addressed.  Patient was encouraged to call with questions or concerns.  PMT will continue to support holistically.   SOCIAL HISTORY:     reports that she is a non-smoker but has been exposed to tobacco smoke. She has never used smokeless tobacco. She reports that she does not drink alcohol and does not use drugs.  CODE STATUS: DNR  ADVANCE DIRECTIVES: Primary Decision Maker: patient. Sister Julie Bender is HCPOA as documented and confirmed by patient    SYMPTOM MANAGEMENT: per attending   Palliative Prophylaxis:   Bowel Regimen, Frequent Pain Assessment and Palliative Wound  Care  PSYCHO-SOCIAL/SPIRITUAL:  Support System: Family  Desire for further Chaplaincy support: NO   Additional Recommendations (Limitations, Scope, Preferences):  Full Scope Treatment and DNR/DNI  Education on palliative    PAST MEDICAL HISTORY: Past Medical History:  Diagnosis Date  . Acute on chronic respiratory failure with hypoxia ([place])   . Asthma   . CHF (congestive heart failure) ([place])   . Chronic heart failure with preserved ejection fraction ([place])   . Chronic obstructive asthma ([place])   . COPD (chronic obstructive pulmonary disease) ([place])   . Diabetes mellitus without complication ([place])   . Diabetic retinopathy ([place])   . End stage renal disease on dialysis ([place])   . History of placement of internal cardiac defibrillator   . Hypertension   . Hypertrophic cardiomyopathy ([place])   . Hypertrophic cardiomyopathy ([place])   . Morbid obesity ([place])   . Obstructive sleep apnea     ALLERGIES:  has No Known Allergies.   MEDICATIONS:  Current Facility-Administered Medications  Medication Dose Route Frequency Provider Last Rate Last Admin  . 0.9 %  sodium chloride infusion  250 mL Intravenous PRN Ivor Costa, MD 10 mL/hr at 04/07/20 2333 250 mL at 04/07/20 2333  . acetaminophen (TYLENOL) tablet 650 mg  650 mg Oral Q4H PRN Ivor Costa, MD   650 mg at 04/05/20 1432  . albumin human 25 % solution 12.5 g  12.5 g Intravenous BID Murlean Iba, MD 60 mL/hr at 04/10/20 0344 12.5 g at 04/10/20 0344  . albuterol (PROVENTIL) (2.5 MG/3ML) 0.083% nebulizer solution 2.5 mg  2.5 mg  Nebulization Q4H PRN Lorretta Harp, MD   2.5 mg at 04/05/20 2328  . ALPRAZolam Prudy Feeler) tablet 0.5 mg  0.5 mg Oral TID PRN Nevin Bloodgood A, MD   0.5 mg at 04/10/20 0845  . amiodarone (PACERONE) tablet 200 mg  200 mg Oral Daily Lorretta Harp, MD   200 mg at 04/10/20 0843  . apixaban (ELIQUIS) tablet 5 mg  5 mg Oral BID Lorretta Harp, MD   5 mg at 04/10/20 0844  . aspirin EC tablet 81 mg  81 mg Oral Daily Lorretta Harp, MD   81  mg at 04/10/20 0845  . atorvastatin (LIPITOR) tablet 20 mg  20 mg Oral Daily Lorretta Harp, MD   20 mg at 04/10/20 0845  . dextromethorphan-guaiFENesin (MUCINEX DM) 30-600 MG per 12 hr tablet 1 tablet  1 tablet Oral BID PRN Lorretta Harp, MD   1 tablet at 04/05/20 2314  . docusate sodium (COLACE) capsule 100 mg  100 mg Oral BID Lorretta Harp, MD   100 mg at 04/10/20 0844  . feeding supplement (NEPRO CARB STEADY) liquid 237 mL  237 mL Oral BID BM Shahmehdi, Seyed A, MD   237 mL at 04/10/20 0846  . ferrous sulfate tablet 325 mg  325 mg Oral Q breakfast Lorretta Harp, MD   325 mg at 04/10/20 0844  . furosemide (LASIX) 250 mg in dextrose 5 % 250 mL (1 mg/mL) infusion  10 mg/hr Intravenous Continuous Lateef, Munsoor, MD 10 mL/hr at 04/09/20 1642 10 mg/hr at 04/09/20 1642  . gabapentin (NEURONTIN) capsule 100 mg  100 mg Oral QID Nevin Bloodgood A, MD   100 mg at 04/10/20 0844  . hydrALAZINE (APRESOLINE) injection 5 mg  5 mg Intravenous Q2H PRN Lorretta Harp, MD      . insulin aspart (novoLOG) injection 0-5 Units  0-5 Units Subcutaneous QHS Darlin Drop, DO   2 Units at 04/09/20 2124  . insulin aspart (novoLOG) injection 0-9 Units  0-9 Units Subcutaneous TID WC Dow Adolph N, DO   5 Units at 04/10/20 1146  . insulin detemir (LEVEMIR) injection 20 Units  20 Units Subcutaneous QHS Kendell Bane, MD   20 Units at 04/09/20 2125  . ipratropium-albuterol (DUONEB) 0.5-2.5 (3) MG/3ML nebulizer solution 3 mL  3 mL Nebulization TID Dow Adolph N, DO   3 mL at 04/09/20 2117  . methylPREDNISolone sodium succinate (SOLU-MEDROL) 40 mg/mL injection 30 mg  30 mg Intravenous Q24H Shahmehdi, Seyed A, MD   30 mg at 04/10/20 0341  . metoprolol succinate (TOPROL-XL) 24 hr tablet 12.5 mg  12.5 mg Oral Daily Lorretta Harp, MD   12.5 mg at 04/10/20 0844  . multivitamin (RENA-VIT) tablet 1 tablet  1 tablet Oral Daily Lorretta Harp, MD   1 tablet at 04/10/20 0845  . multivitamin with minerals tablet 1 tablet  1 tablet Oral Daily Kendell Bane, MD   1 tablet at 04/10/20 0845  . mupirocin cream (BACTROBAN) 2 %   Topical BID Darlin Drop, DO   Given at 04/09/20 2126  . ondansetron (ZOFRAN) injection 4 mg  4 mg Intravenous Q6H PRN Lorretta Harp, MD      . oxyCODONE (Oxy IR/ROXICODONE) immediate release tablet 5 mg  5 mg Oral Q6H PRN Lorretta Harp, MD   5 mg at 04/10/20 0845  . pantoprazole (PROTONIX) EC tablet 40 mg  40 mg Oral Daily Lorretta Harp, MD   40 mg at 04/10/20 0845  . polyethylene glycol (MIRALAX / GLYCOLAX)  packet 17 g  17 g Oral Daily Ivor Costa, MD   17 g at 04/10/20 0845  . potassium chloride SA (KLOR-CON) CR tablet 20 mEq  20 mEq Oral Daily Lu Duffel, RPH   20 mEq at 04/10/20 0845  . sertraline (ZOLOFT) tablet 100 mg  100 mg Oral Daily Ivor Costa, MD   100 mg at 04/10/20 0844  . sodium chloride flush (NS) 0.9 % injection 3 mL  3 mL Intravenous Q12H Ivor Costa, MD   3 mL at 04/10/20 0846  . sodium chloride flush (NS) 0.9 % injection 3 mL  3 mL Intravenous PRN Ivor Costa, MD      . vitamin B-12 (CYANOCOBALAMIN) tablet 1,000 mcg  1,000 mcg Oral Daily Ivor Costa, MD   1,000 mcg at 04/10/20 0846  . zinc sulfate capsule 220 mg  220 mg Oral Daily Ivor Costa, MD   220 mg at 04/10/20 0844    VITAL SIGNS: BP 115/66 (BP Location: Left Arm)   Pulse 75   Temp (!) 97.5 F (36.4 C) (Oral)   Resp 18   Ht _0  (1.626 m)   Wt 101.1 kg   SpO2 100%   BMI 38.24 kg/m  Filed Weights   04/07/20 0440 04/08/20 0414 04/10/20 0514  Weight: 101.8 kg 101.7 kg 101.1 kg    Estimated body mass index is 38.24 kg/m as calculated from the following:   Height as of this encounter: _1  (1.626 m).   Weight as of this encounter: 101.1 kg.  LABS: CBC:    Component Value Date/Time   WBC 9.3 04/10/2020 0618   HGB 8.1 (L) 04/10/2020 0618   HGB 13.9 06/14/2019 1159   HCT 25.8 (L) 04/10/2020 0618   HCT 43.1 06/14/2019 1159   PLT 262 04/10/2020 0618   PLT 328 06/14/2019 1159   Comprehensive Metabolic Panel:    Component Value  Date/Time   NA 138 04/10/2020 0618   NA 138 06/14/2019 1159   K 4.1 04/10/2020 0618   BUN 47 (H) 04/10/2020 0618   BUN 20 06/14/2019 1159   CREATININE 1.43 (H) 04/10/2020 0618   ALBUMIN 3.8 04/05/2020 0546     Review of Systems  Constitutional: Positive for activity change.  Musculoskeletal: Positive for arthralgias.  Neurological: Positive for weakness.  Unless otherwise noted, a complete review of systems is negative.  Physical Exam General: NAD, chronically-ill appearing Cardiovascular: regular rate and rhythm Pulmonary: diminished bilaterally  Abdomen: soft, nontender, + bowel sounds Extremities: bilateral lower edema, bilateral hand with finger gangrenous changes Skin: no rashes, warm and dry Neurological: Alert and oriented x3, mood appropriate, follows commands   Prognosis: Guarded-Poor  Discharge Planning:  To Be Determined (Patient requested SNF for rehab and outpatient Palliative support)   Recommendations: . DNR/DNI-as confirmed/requested by patient . Continue with current plan of care per medical team  . Patient expressed clear goals and wishes to continue to treat the treatable. She remains hopeful for some improvement allowing her to eventually return home and provide self care independently.  . Patient expressing plans for return to rehab and outpatient palliative support (TOC referral placed for outpatient Palliative)  . PMT will continue to support and follow. Please call team line with urgent needs.   Palliative Performance Scale: PPS 30-40%              Patient expressed understanding and was in agreement with this plan.   Thank you for allowing the Palliative Medicine Team to assist in  the care of this patient.  Time In: 0925 Time Out: 1020 Time Total: 55 min  Visit consisted of counseling and education dealing with the complex and emotionally intense issues of symptom management and palliative care in the setting of serious and potentially  life-threatening illness.Greater than 50%  of this time was spent counseling and coordinating care related to the above assessment and plan.  Signed by:  Alda Lea, AGPCNP-BC Palliative Medicine Team  Phone: [phone] Pager: [phone] Amion: Julie Bender

## 2020-04-10 NOTE — Care Management Important Message (Signed)
Important Message  Patient Details  Name: Julie Bender MRN: [id number] Date of Birth: September 13, 1960   Medicare Important Message Given:  Yes     Dannette Barbara 04/10/2020, 1:41 PM

## 2020-04-10 NOTE — Progress Notes (Signed)
PROGRESS NOTE    Julie Bender  KCL:[id number] DOB: 1960-11-21 DOA: 03/30/2020 PCP: Deanne Coffer, MD  Assessment & Plan:   Principal Problem:   Acute on chronic diastolic CHF (congestive heart failure) ([place]) Active Problems:   Depression   Elevated troponin   Acute on chronic respiratory failure with hypoxia (HCC)   COPD (chronic obstructive pulmonary disease) ([place])   Hypertension   Type II diabetes mellitus with renal manifestations ([place])   CKD (chronic kidney disease), stage IIIa   Atrial fibrillation, chronic (HCC)   Acute on chronic hypoxic respiratory failure: secondary to pulmonary edema in setting of acute on chronic diastolic CHF exacerbation. Continue on supplemental oxygen.    Acute on chronic diastolic CHF exacerbation: continue on IV lasix drip. Monitor I/Os. Neg fluid balance. Echo shows EF 55-60%, grade II diastolic dysfunction   AKI on CKDIIIb : Cr continues to improve. 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 No intervention at this time as per vascular surg. Continue eliquis. Present on admission. Likely secondary to previously being on pressors as per pt's sister   Leukocytosis: resolved  Elevated troponin: likely secondary to demand ischemia.   QT prolongation: w/ hx of PAF. Continue on metoprolol, amiodarone & eliquis.   Depression: severity unknown. Continue on home dose of zoloft   COPD:continue on steroid taper & bronchodilators. Continue on supplemental oxygen. Encourage incentive spirometry   Hypertension: continue on metoprolol, lasix. IV hydralazine prn   HLD: continue on statin   DM2: HbA1c 6.7 on 03/30/2020. Continue on levemir & SSI w/ accuchecks    ACD: H&H are stable. Will transfuse if Hb <7.0. Will continue to monitor   Severe debility: PT/OT recs SNF. Has not walked since Feb 2021    DVT prophylaxis: eliquis  Code Status: full  Family Communication: discussed pt's care w/  pt's sister, [name], and answered her questions  Disposition Plan: likely d/c to SNF    Status is: Inpatient  Remains inpatient appropriate because:IV treatments appropriate due to intensity of illness or inability to take PO   Dispo:  Patient From: [facility]  Planned Disposition: [place]  Expected discharge date: in >3 days  Medically stable for discharge: No    Consultants:   Vascular surg   nephro    Procedures:    Antimicrobials:    Subjective: Pt c/o fatigue  Objective: Vitals:   04/09/20 1602 04/09/20 1953 04/09/20 2117 04/10/20 0514  BP: (!) 150/122 132/74  125/67  Pulse: 74 74  74  Resp: '18 20  20  ' Temp:  98 F (36.7 C)  98.7 F (37.1 C)  TempSrc:  Oral  Oral  SpO2: 100% 100% 98% 98%  Weight:    101.1 kg  Height:        Intake/Output Summary (Last 24 hours) at 04/10/2020 0745 Last data filed at 04/10/2020 0519 Gross per 24 hour  Intake --  Output 1200 ml  Net -1200 ml   Filed Weights   04/07/20 0440 04/08/20 0414 04/10/20 0514  Weight: 101.8 kg 101.7 kg 101.1 kg    Examination: General exam: Appears calm and comfortable  Respiratory system: decreased breath sounds b/l Cardiovascular system: S1 & S2 +. No rubs, gallops or clicks.  Gastrointestinal system: Abdomen is obese, soft and nontender. Normal bowel sounds heard. Central nervous system: Alert & awake. Moves all 4 extremities  Skin: dry gangrene on fingers on both hands Psychiatry: Judgement and insight appear abnormal. Flat  mood and affect     Data Reviewed: I have personally reviewed following labs and imaging studies  CBC: Recent Labs  Lab 04/05/20 0546 04/06/20 0550 04/07/20 0419 04/08/20 0535 04/10/20 0618  WBC 7.2 8.5 11.8* 12.1* 9.3  HGB 8.0* 8.3* 8.3* 8.1* 8.1*  HCT 23.9* 25.7* 24.5* 26.1* 25.8*  MCV 76.8* 80.3 77.3* 80.6 81.4  PLT 231 246 249 255 696   Basic Metabolic Panel: Recent Labs  Lab 04/06/20 0550 04/07/20 0419  04/08/20 0535 04/09/20 0505 04/10/20 0618  NA 134* 136 137 138 138  K 3.6 3.1* 3.0* 3.8 4.1  CL 92* 92* 93* 95* 95*  CO2 '26 27 30 ' 32 31  GLUCOSE 213* 206* 185* 110* 181*  BUN 57* 57* 54* 50* 47*  CREATININE 2.08* 1.97* 1.77* 1.63* 1.43*  CALCIUM 9.4 9.4 9.5 9.5 9.6  MG  --   --   --  2.0  --   PHOS  --   --   --  2.7  --    GFR: Estimated Creatinine Clearance: 47.8 mL/min (A) (by C-G formula based on SCr of 1.43 mg/dL (H)). Liver Function Tests: Recent Labs  Lab 04/05/20 0546  ALBUMIN 3.8   No results for input(s): LIPASE, AMYLASE in the last 168 hours. No results for input(s): AMMONIA in the last 168 hours. Coagulation Profile: No results for input(s): INR, PROTIME in the last 168 hours. Cardiac Enzymes: No results for input(s): CKTOTAL, CKMB, CKMBINDEX, TROPONINI in the last 168 hours. BNP (last 3 results) No results for input(s): PROBNP in the last 8760 hours. HbA1C: No results for input(s): HGBA1C in the last 72 hours. CBG: Recent Labs  Lab 04/08/20 2131 04/09/20 0833 04/09/20 1132 04/09/20 1719 04/09/20 2054  GLUCAP 200* 102* 252* 177* 202*   Lipid Profile: No results for input(s): CHOL, HDL, LDLCALC, TRIG, CHOLHDL, LDLDIRECT in the last 72 hours. Thyroid Function Tests: No results for input(s): TSH, T4TOTAL, FREET4, T3FREE, THYROIDAB in the last 72 hours. Anemia Panel: No results for input(s): VITAMINB12, FOLATE, FERRITIN, TIBC, IRON, RETICCTPCT in the last 72 hours. Sepsis Labs: No results for input(s): PROCALCITON, LATICACIDVEN in the last 168 hours.  No results found for this or any previous visit (from the past 240 hour(s)).       Radiology Studies: IR Fluoro Guide CV Line Left  Result Date: 04/09/2020 INDICATION: End-stage renal disease, CHF, peripheral vascular disease, no current access EXAM: TUNNELED PICC LINE WITH ULTRASOUND AND FLUOROSCOPIC GUIDANCE MEDICATIONS: 1% lidocaine local. ANESTHESIA/SEDATION: Moderate Sedation Time:  None. The  patient was continuously monitored during the procedure by the interventional radiology nurse under my direct supervision. FLUOROSCOPY TIME:  Fluoroscopy Time: 0 minutes 45 seconds (6.76 mGy). COMPLICATIONS: None immediate. PROCEDURE: Informed written consent was obtained from the patient after a discussion of the risks, benefits, and alternatives to treatment. Questions regarding the procedure were encouraged and answered. The right neck and chest were prepped with chlorhexidine in a sterile fashion, and a sterile drape was applied covering the operative field. Maximum barrier sterile technique with sterile gowns and gloves were used for the procedure. A timeout was performed prior to the initiation of the procedure. After creating a small venotomy incision, a micropuncture kit was utilized to access the right external jugular vein under direct, real-time ultrasound guidance after the overlying soft tissues were anesthetized with 1% lidocaine with epinephrine. Ultrasound image documentation was performed. The microwire was kinked to measure appropriate catheter length. The micropuncture sheath was exchanged for a peel-away sheath over  a guidewire. A 5 French dual lumen tunneled PICC measuring 25 cm was tunneled in a retrograde fashion from the anterior chest wall to the venotomy incision. The catheter was then placed through the peel-away sheath with tip ultimately positioned at the superior caval-atrial junction. Final catheter positioning was confirmed and documented with a spot radiographic image. The catheter aspirates and flushes normally. The catheter was flushed with appropriate volume heparin dwells. The catheter exit site was secured with a 3 0 Ethilon retention suture. The venotomy incision was closed with Dermabond. Gel-Foam inserted into the subcutaneous tunnel for hemostasis. Dressings were applied. The patient tolerated the procedure well without immediate post procedural complication. FINDINGS: After  catheter placement, the tip lies within the superior cavoatrial junction. The catheter aspirates and flushes normally and is ready for immediate use. IMPRESSION: Successful placement of 25cm dual lumen tunneled PICC catheter via the right external jugular vein with tip terminating at the superior caval atrial junction. The catheter is ready for immediate use. Electronically Signed   By: Jerilynn Mages.  Shick M.D.   On: 04/09/2020 16:20   [place] EKG SITE RITE  Result Date: 04/09/2020 If Site Rite image not attached, placement could not be confirmed due to current cardiac rhythm.       Scheduled Meds: . amiodarone  200 mg Oral Daily  . apixaban  5 mg Oral BID  . aspirin EC  81 mg Oral Daily  . atorvastatin  20 mg Oral Daily  . docusate sodium  100 mg Oral BID  . feeding supplement (NEPRO CARB STEADY)  237 mL Oral BID BM  . ferrous sulfate  325 mg Oral Q breakfast  . gabapentin  100 mg Oral QID  . insulin aspart  0-5 Units Subcutaneous QHS  . insulin aspart  0-9 Units Subcutaneous TID WC  . insulin detemir  20 Units Subcutaneous QHS  . ipratropium-albuterol  3 mL Nebulization TID  . methylPREDNISolone (SOLU-MEDROL) injection  30 mg Intravenous Q24H  . metoprolol succinate  12.5 mg Oral Daily  . multivitamin  1 tablet Oral Daily  . multivitamin with minerals  1 tablet Oral Daily  . mupirocin cream   Topical BID  . pantoprazole  40 mg Oral Daily  . polyethylene glycol  17 g Oral Daily  . potassium chloride  20 mEq Oral BID  . sertraline  100 mg Oral Daily  . sodium chloride flush  3 mL Intravenous Q12H  . cyanocobalamin  1,000 mcg Oral Daily  . zinc sulfate  220 mg Oral Daily   Continuous Infusions: . sodium chloride 250 mL (04/07/20 2333)  . albumin human 12.5 g (04/10/20 0344)  . furosemide (LASIX) infusion 10 mg/hr (04/09/20 1642)     LOS: 11 days    Time spent: 30 mins    Wyvonnia Dusky, MD [facility] Pager [phone]  If 7PM-7AM, please contact  night-coverage [email or web address] 04/10/2020, 7:45 AM

## 2020-04-10 NOTE — Progress Notes (Signed)
[facility] Liaison note:  New referral for [employer] community Palliative program to follow post discharge received from Palliative NP [place]. TOC Ginnie Russoli made aware. Patient information given to referral. Thank you.  Flo Shanks BSN, RN, [place] [phone]

## 2020-04-10 NOTE — Consult Note (Signed)
PHARMACY CONSULT NOTE - FOLLOW UP  Pharmacy Consult for Electrolyte Monitoring and Replacement   Recent Labs: Potassium (mmol/L)  Date Value  04/10/2020 4.1   Magnesium (mg/dL)  Date Value  04/09/2020 2.0   Calcium (mg/dL)  Date Value  04/10/2020 9.6   Albumin (g/dL)  Date Value  04/05/2020 3.8   Phosphorus (mg/dL)  Date Value  04/09/2020 2.7   Sodium (mmol/L)  Date Value  04/10/2020 138  06/14/2019 138     Assessment: 59 yo female with prolonged hospitalization on lasix drip and albumin with improving serum creatinine and continued electrolyte abnormalities.    Pharmacy has been consulted to monitor/replenish electrolytes.  Patient currently with standing order for KCl 66meq bid on lasix drip  Renal function continuing to improve Scr 2.1>2.08>1.97>1.77>1.63>1.43  Electrolytes wnl's today  Goal of Therapy:  Electrolytes wnl's  Plan:  K = 3.8   - will decrease standing order for KCl today to 23meq qd  BMP with am labs  Julie Bender ,PharmD Clinical Pharmacist 04/10/2020 8:21 AM

## 2020-04-10 NOTE — Progress Notes (Signed)
[facility]  ROUNDING NOTE   Subjective:  Pt reports her legs are less heavy now.  Still on lasix gtt at 2m/hr.  Recorded UOP was 1.2 liters yesterday.   Objective:  Vital signs in last 24 hours:  Temp:  [97.5 F (36.4 C)-98.7 F (37.1 C)] 97.5 F (36.4 C) (08/26 1140) Pulse Rate:  [74-75] 75 (08/26 1140) Resp:  [18-20] 18 (08/26 1140) BP: (102-150)/(61-122) 115/66 (08/26 1140) SpO2:  [98 %-100 %] 100 % (08/26 1140) Weight:  [101.1 kg] 101.1 kg (08/26 0514)  Weight change:  Filed Weights   04/07/20 0440 04/08/20 0414 04/10/20 0514  Weight: 101.8 kg 101.7 kg 101.1 kg    Intake/Output: I/O last 3 completed shifts: In: 622.4 [I.V.:572.4; IV Piggyback:50] Out: 2400 [Urine:2400]   Intake/Output this shift:  Total I/O In: 240 [P.O.:240] Out: 1280 [Urine:1280]  Physical Exam: General:  No acute distress  Head:  Normocephalic, atraumatic. Moist oral mucosal membranes  Eyes:  Anicteric  Neck:  Supple  Lungs:   Clear to auscultation, normal effort  Heart:  S1S2 no rubs  Abdomen:   Soft, nontender, bowel sounds present  Extremities:  2+peripheral edema, less tight.  Neurologic:  Awake, alert, following commands  Skin:  No lesions       Basic Metabolic Panel: Recent Labs  Lab 04/06/20 0550 04/06/20 0550 04/07/20 0419 04/07/20 0419 04/08/20 0535 04/09/20 0505 04/10/20 0618  NA 134*  --  136  --  137 138 138  K 3.6  --  3.1*  --  3.0* 3.8 4.1  CL 92*  --  92*  --  93* 95* 95*  CO2 26  --  27  --  30 32 31  GLUCOSE 213*  --  206*  --  185* 110* 181*  BUN 57*  --  57*  --  54* 50* 47*  CREATININE 2.08*  --  1.97*  --  1.77* 1.63* 1.43*  CALCIUM 9.4   < > 9.4   < > 9.5 9.5 9.6  MG  --   --   --   --   --  2.0  --   PHOS  --   --   --   --   --  2.7  --    < > = values in this interval not displayed.    Liver Function Tests: Recent Labs  Lab 04/05/20 0546  ALBUMIN 3.8   No results for input(s): LIPASE, AMYLASE in the last 168 hours. No  results for input(s): AMMONIA in the last 168 hours.  CBC: Recent Labs  Lab 04/05/20 0546 04/06/20 0550 04/07/20 0419 04/08/20 0535 04/10/20 0618  WBC 7.2 8.5 11.8* 12.1* 9.3  HGB 8.0* 8.3* 8.3* 8.1* 8.1*  HCT 23.9* 25.7* 24.5* 26.1* 25.8*  MCV 76.8* 80.3 77.3* 80.6 81.4  PLT 231 246 249 255 262    Cardiac Enzymes: No results for input(s): CKTOTAL, CKMB, CKMBINDEX, TROPONINI in the last 168 hours.  BNP: Invalid input(s): POCBNP  CBG: Recent Labs  Lab 04/09/20 1132 04/09/20 1719 04/09/20 2054 04/10/20 0821 04/10/20 1141  GLUCAP 252* 177* 202* 181* 297*    Microbiology: Results for orders placed or performed during the hospital encounter of 03/30/20  SARS Coronavirus 2 by RT PCR (hospital order[facility] Nasopharyngeal Nasopharyngeal Swab     Status: None   Collection Time: 03/30/20  1:51 PM   Specimen: Nasopharyngeal Swab  Result Value Ref Range Status   SARS Coronavirus 2  NEGATIVE NEGATIVE Final    Comment: (NOTE) SARS-CoV-2 target nucleic acids are NOT DETECTED.  The SARS-CoV-2 RNA is generally detectable in upper and lower respiratory specimens during the acute phase of infection. The lowest concentration of SARS-CoV-2 viral copies this assay can detect is 250 copies / mL. A negative result does not preclude SARS-CoV-2 infection and should not be used as the sole basis for treatment or other patient management decisions.  A negative result may occur with improper specimen collection / handling, submission of specimen other than nasopharyngeal swab, presence of viral mutation(s) within the areas targeted by this assay, and inadequate number of viral copies (<250 copies / mL). A negative result must be combined with clinical observations, patient history, and epidemiological information.  Fact Sheet for Patients:   StrictlyIdeas.no  Fact Sheet for Healthcare  Providers: BankingDealers.co.za  This test is not yet approved or  cleared by the [place] FDA and has been authorized for detection and/or diagnosis of SARS-CoV-2 by FDA under an Emergency Use Authorization (EUA).  This EUA will remain in effect (meaning this test can be used) for the duration of the COVID-19 declaration under Section 564(b)(1) of the Act, 21 U.S.C. section 360bbb-3(b)(1), unless the authorization is terminated or revoked sooner.  Performed at [facility], [address] [place], [place] [address]   MRSA PCR Screening     Status: None   Collection Time: 03/30/20 10:23 PM   Specimen: Nasal Mucosa; Nasopharyngeal  Result Value Ref Range Status   MRSA by PCR NEGATIVE NEGATIVE Final    Comment:        The GeneXpert MRSA Assay (FDA approved for NASAL specimens only), is one component of a comprehensive MRSA colonization surveillance program. It is not intended to diagnose MRSA infection nor to guide or monitor treatment for MRSA infections. Performed at [facility], [address] [place], [place] [address]     Coagulation Studies: No results for input(s): LABPROT, INR in the last 72 hours.  Urinalysis: No results for input(s): COLORURINE, LABSPEC, PHURINE, GLUCOSEU, HGBUR, BILIRUBINUR, KETONESUR, PROTEINUR, UROBILINOGEN, NITRITE, LEUKOCYTESUR in the last 72 hours.  Invalid input(s): APPERANCEUR    Imaging: IR Fluoro Guide CV Line Left  Result Date: 04/09/2020 INDICATION: End-stage renal disease, CHF, peripheral vascular disease, no current access EXAM: TUNNELED PICC LINE WITH ULTRASOUND AND FLUOROSCOPIC GUIDANCE MEDICATIONS: 1% lidocaine local. ANESTHESIA/SEDATION: Moderate Sedation Time:  None. The patient was continuously monitored during the procedure by the interventional radiology nurse under my direct supervision. FLUOROSCOPY TIME:  Fluoroscopy Time: 0 minutes 45 seconds (6.76 mGy). COMPLICATIONS: None  immediate. PROCEDURE: Informed written consent was obtained from the patient after a discussion of the risks, benefits, and alternatives to treatment. Questions regarding the procedure were encouraged and answered. The right neck and chest were prepped with chlorhexidine in a sterile fashion, and a sterile drape was applied covering the operative field. Maximum barrier sterile technique with sterile gowns and gloves were used for the procedure. A timeout was performed prior to the initiation of the procedure. After creating a small venotomy incision, a micropuncture kit was utilized to access the right external jugular vein under direct, real-time ultrasound guidance after the overlying soft tissues were anesthetized with 1% lidocaine with epinephrine. Ultrasound image documentation was performed. The microwire was kinked to measure appropriate catheter length. The micropuncture sheath was exchanged for a peel-away sheath over a guidewire. A 5 French dual lumen tunneled PICC measuring 25 cm was tunneled in a retrograde fashion from the  anterior chest wall to the venotomy incision. The catheter was then placed through the peel-away sheath with tip ultimately positioned at the superior caval-atrial junction. Final catheter positioning was confirmed and documented with a spot radiographic image. The catheter aspirates and flushes normally. The catheter was flushed with appropriate volume heparin dwells. The catheter exit site was secured with a 3 0 Ethilon retention suture. The venotomy incision was closed with Dermabond. Gel-Foam inserted into the subcutaneous tunnel for hemostasis. Dressings were applied. The patient tolerated the procedure well without immediate post procedural complication. FINDINGS: After catheter placement, the tip lies within the superior cavoatrial junction. The catheter aspirates and flushes normally and is ready for immediate use. IMPRESSION: Successful placement of 25cm dual lumen tunneled  PICC catheter via the right external jugular vein with tip terminating at the superior caval atrial junction. The catheter is ready for immediate use. Electronically Signed   By: Jerilynn Mages.  Shick M.D.   On: 04/09/2020 16:20   [place] EKG SITE RITE  Result Date: 04/09/2020 If Site Rite image not attached, placement could not be confirmed due to current cardiac rhythm.    Medications:   . sodium chloride 250 mL (04/07/20 2333)  . albumin human 12.5 g (04/10/20 0344)  . furosemide (LASIX) infusion 10 mg/hr (04/09/20 1642)   . amiodarone  200 mg Oral Daily  . apixaban  5 mg Oral BID  . aspirin EC  81 mg Oral Daily  . atorvastatin  20 mg Oral Daily  . docusate sodium  100 mg Oral BID  . feeding supplement (NEPRO CARB STEADY)  237 mL Oral BID BM  . ferrous sulfate  325 mg Oral Q breakfast  . gabapentin  100 mg Oral QID  . insulin aspart  0-5 Units Subcutaneous QHS  . insulin aspart  0-9 Units Subcutaneous TID WC  . insulin detemir  20 Units Subcutaneous QHS  . ipratropium-albuterol  3 mL Nebulization TID  . methylPREDNISolone (SOLU-MEDROL) injection  30 mg Intravenous Q24H  . metoprolol succinate  12.5 mg Oral Daily  . multivitamin  1 tablet Oral Daily  . multivitamin with minerals  1 tablet Oral Daily  . mupirocin cream   Topical BID  . pantoprazole  40 mg Oral Daily  . polyethylene glycol  17 g Oral Daily  . potassium chloride  20 mEq Oral Daily  . sertraline  100 mg Oral Daily  . sodium chloride flush  3 mL Intravenous Q12H  . cyanocobalamin  1,000 mcg Oral Daily  . zinc sulfate  220 mg Oral Daily   sodium chloride, acetaminophen, albuterol, ALPRAZolam, dextromethorphan-guaiFENesin, hydrALAZINE, ondansetron (ZOFRAN) IV, oxyCODONE, sodium chloride flush  Assessment/ Plan:  59 y.o. female with medical problems of HTN, HLD, DM, COPD, asthma, depression, OSA, ICD placement, diastolic CHF, h/o HOCM s/p myomectomy at [facility], prolonged hospitalization, CKD, A Fib , was admitted on 03/30/2020 with  SOB (shortness of breath) [R06.02] Acute on chronic diastolic CHF (congestive heart failure) ([place]) [I50.33]   #Acute renal failure #Generalized edema #Chronic kidney disease stage III B.  Baseline creatinine 1.36, GFR 42-49 April 01, 2020 #Gangrenous changes bilateral hands, left worse than right # DM-2 with CKD # Grade 2 diastolic dysfunction # Hyperkalemia, now resolved  Plan:  Edema status slowly improving.  UOP was only 1.2 liters yesterday despite high dose lasix 12m/hr.  Continue lasix gtt for now.  Otherwise additional treatment per hospitalist.    LOS: 11 Julie Bender 8/26/202111:42 AM

## 2020-04-10 NOTE — Progress Notes (Signed)
Inpatient Diabetes Program Recommendations  AACE/ADA: New Consensus Statement on Inpatient Glycemic Control   Target Ranges:  Prepandial:   less than 140 mg/dL      Peak postprandial:   less than 180 mg/dL (1-2 hours)      Critically ill patients:  140 - 180 mg/dL     Review of Glycemic Control Results for [name], Julie Bender (MRN [id number]) as of 04/10/2020 12:12  Ref. Range 04/09/2020 08:33 04/09/2020 11:32 04/09/2020 17:19 04/09/2020 20:54 04/10/2020 08:21 04/10/2020 11:41  Glucose-Capillary Latest Ref Range: 70 - 99 mg/dL 102 (H) 252 (H) 177 (H) 202 (H) 181 (H) 297 (H)   Current orders for Inpatient glycemic control:Levemir 20 units QHS, Novolog 0-9 units TID with meals, Novolog 0-5 units QHS; Solumedrol 30 mg Q24H  Nepro bid between meals  Inpatient Diabetes Program Recommendations:  Post prandial glucose consistently elevated. Consider ordering Novolog4units TID with meals for meal coverage if patient eats at least 50% of meals  Thanks, Tama Headings RN, MSN, BC-ADM Inpatient Diabetes Coordinator Team Pager [phone] (8a-5p)

## 2020-04-10 NOTE — Progress Notes (Signed)
Mobility Specialist - Progress Note   04/10/20 1140  Mobility  Activity Dangled on edge of bed;Turned to back (supine)  Range of Motion/Exercises Right leg;Left leg (Straight leg raises, hip iso, ankle pumps)  Level of Assistance Moderate assist, patient does 50-74%  Assistive Device None  Distance Ambulated (ft) 0 ft  Mobility Response Tolerated well  Mobility performed by Mobility specialist  $Mobility charge 1 Mobility    Pre-mobility: 74 HR, 133/65 BP, 100% SpO2 During mobility: 75 HR, 98% SpO2 Post-mobility: 75 HR, 124/72 BP, 99% SpO2   Pt was lying in bed upon arrival. Pt agreed to session. Pt was more alert today than previous visits. Pt c/o pain in her hands and legs, but willing to transfer from supine-EOB. Pt was modA in bed mobility from supine-EOB, and minA while performing EOB exercises (ankle pumps). Pt begin c/o pain in abdomen "9/10". After getting pt to transfer EOB-supine with modA, pt was able to perform bed exercises (straight leg raises, ankle pumps, hip isometrics). Pt eager to attempt ambulation, but needs to continue building LE strength before safe attempt. Pt encouraged to continue performing bed exercises throughout day. Overall, pt tolerated session well. Pt remains in bed with phone/call bell in reach. Nurse was notified of performance.    Kathee Delton Mobility Specialist 04/10/20, 11:49 AM

## 2020-04-10 NOTE — Progress Notes (Signed)
Physical Therapy Treatment Patient Details Name: Julie Bender MRN: [id number] DOB: 1961-03-30 Today's Date: 04/10/2020    History of Present Illness Pt is a 59 y.o. female with PMH significant of htn, HLD, DM, COPD on 3 L O2, asthma, depression, OSA, ICD placement, dCHF, HOCM s/p 09/2019 [facility] myectomy with a prolonged hospital course and multiple complications, toe amputations B, CKD-III, a-fib on Eliquis, who presents with a CHF exacerbation and acute respiratory failure with hypoxia.  Pt is from SNF.    PT Comments    Pt resting in bed upon PT arrival; pt reporting recently working with mobility team and had abdominal pain in sitting so pt did not want to try sitting up on edge of bed again (but pt was agreeable to bed level ex's); pt reports no abdominal pain resting in bed.  Pt tolerated LE ex's in bed fairly well but then pt started to have "shooting pain" to her hands/fingers and feet (pt reports h/o this and takes medication for it) and pt requesting to stop therapy: nurse notified of pt's request for pain meds.  Will continue to focus on strengthening and progressive functional mobility per pt tolerance.    Follow Up Recommendations  SNF     Equipment Recommendations  Other (comment) (TBD at next facility)    Recommendations for Other Services       Precautions / Restrictions Precautions Precautions: Fall Restrictions Weight Bearing Restrictions: No    Mobility  Bed Mobility                  Transfers                    Ambulation/Gait                 Stairs             Wheelchair Mobility    Modified Rankin (Stroke Patients Only)       Balance                                            Cognition Arousal/Alertness: Awake/alert Behavior During Therapy: WFL for tasks assessed/performed Overall Cognitive Status: Within Functional Limits for tasks assessed                                         Exercises General Exercises - Lower Extremity Ankle Circles/Pumps: AROM;Strengthening;Both;10 reps;Supine Quad Sets: AROM;Strengthening;Both;10 reps;Supine Gluteal Sets: AROM;Strengthening;Both;10 reps;Supine Short Arc Quad: AROM;Strengthening;Both;10 reps;Supine Heel Slides: AAROM;Strengthening;Both;10 reps;Supine Hip ABduction/ADduction: AAROM;Strengthening;Both;10 reps;Supine    General Comments   Nursing cleared pt for participation in physical therapy.  Pt agreeable to PT session.  Per pt request, therapist assisted pt with meal set-up and cutting up her food prior to leaving pt's room.      Pertinent Vitals/Pain   Vitals (HR and O2 on supplemental O2 via nasal cannula) stable and WFL throughout treatment session.      Home Living                      Prior Function            PT Goals (current goals can now be found in the care plan section) Acute Rehab PT Goals Patient Stated Goal: to complete rehab and  return home  PT Goal Formulation: With patient Time For Goal Achievement: 04/14/20 Potential to Achieve Goals: Fair Progress towards PT goals: Progressing toward goals (with LE strengthening)    Frequency    Min 2X/week      PT Plan Current plan remains appropriate    Co-evaluation              AM-PAC PT "6 Clicks" Mobility   Outcome Measure  Help needed turning from your back to your side while in a flat bed without using bedrails?: A Lot Help needed moving from lying on your back to sitting on the side of a flat bed without using bedrails?: Total Help needed moving to and from a bed to a chair (including a wheelchair)?: Total Help needed standing up from a chair using your arms (e.g., wheelchair or bedside chair)?: Total Help needed to walk in hospital room?: Total Help needed climbing 3-5 steps with a railing? : Total 6 Click Score: 7    End of Session Equipment Utilized During Treatment: Gait belt;Oxygen Activity Tolerance: Patient  limited by pain Patient left: in bed;with call bell/phone within reach;with bed alarm set;Other (comment) (B heels floating via pillow support) Nurse Communication: Patient requests pain meds PT Visit Diagnosis: Unsteadiness on feet (R26.81);Muscle weakness (generalized) (M62.81);Other abnormalities of gait and mobility (R26.89)     Time: 2820-6015 PT Time Calculation (min) (ACUTE ONLY): 23 min  Charges:  $Therapeutic Exercise: 23-37 mins                     Leitha Bleak, PT 04/10/20, 4:42 PM

## 2020-04-11 LAB — CBC
HCT: 24.7 % — ABNORMAL LOW (ref 36.0–46.0)
Hemoglobin: 8.1 g/dL — ABNORMAL LOW (ref 12.0–15.0)
MCH: 25.4 pg — ABNORMAL LOW (ref 26.0–34.0)
MCHC: 32.8 g/dL (ref 30.0–36.0)
MCV: 77.4 fL — ABNORMAL LOW (ref 80.0–100.0)
Platelets: 295 10*3/uL (ref 150–400)
RBC: 3.19 MIL/uL — ABNORMAL LOW (ref 3.87–5.11)
RDW: 22 % — ABNORMAL HIGH (ref 11.5–15.5)
WBC: 10.7 10*3/uL — ABNORMAL HIGH (ref 4.0–10.5)
nRBC: 0.7 % — ABNORMAL HIGH (ref 0.0–0.2)

## 2020-04-11 LAB — BASIC METABOLIC PANEL
Anion gap: 12 (ref 5–15)
BUN: 48 mg/dL — ABNORMAL HIGH (ref 8–23)
CO2: 35 mmol/L — ABNORMAL HIGH (ref 22–32)
Calcium: 9.9 mg/dL (ref 8.9–10.3)
Chloride: 92 mmol/L — ABNORMAL LOW (ref 98–111)
Creatinine, Ser: 1.58 mg/dL — ABNORMAL HIGH (ref 0.44–1.00)
GFR calc Af Amer: 41 mL/min — ABNORMAL LOW (ref >60)
GFR calc non Af Amer: 35 mL/min — ABNORMAL LOW (ref >60)
Glucose, Bld: 204 mg/dL — ABNORMAL HIGH (ref 70–99)
Potassium: 4 mmol/L (ref 3.5–5.1)
Sodium: 139 mmol/L (ref 135–145)

## 2020-04-11 LAB — GLUCOSE, CAPILLARY
Glucose-Capillary: 212 mg/dL — ABNORMAL HIGH (ref 70–99)
Glucose-Capillary: 268 mg/dL — ABNORMAL HIGH (ref 70–99)
Glucose-Capillary: 220 mg/dL — ABNORMAL HIGH (ref 70–99)
Glucose-Capillary: 242 mg/dL — ABNORMAL HIGH (ref 70–99)

## 2020-04-11 MED ORDER — CHLORHEXIDINE GLUCONATE CLOTH 2 % EX PADS
6.0000 | MEDICATED_PAD | Freq: Every day | CUTANEOUS | Status: DC
  Administered 2020-04-11 – 2020-04-18 (×5): 6 via TOPICAL

## 2020-04-11 MED ORDER — INSULIN ASPART 100 UNIT/ML ~~LOC~~ SOLN
3.0000 [IU] | Freq: Three times a day (TID) | SUBCUTANEOUS | Status: DC
  Administered 2020-04-11 – 2020-04-13 (×7): 3 [IU] via SUBCUTANEOUS
  Filled 2020-04-11 (×7): qty 1

## 2020-04-11 NOTE — Consult Note (Signed)
PHARMACY CONSULT NOTE - FOLLOW UP  Pharmacy Consult for Electrolyte Monitoring and Replacement   Recent Labs: Potassium (mmol/L)  Date Value  04/11/2020 4.0   Magnesium (mg/dL)  Date Value  04/09/2020 2.0   Calcium (mg/dL)  Date Value  04/11/2020 9.9   Albumin (g/dL)  Date Value  04/05/2020 3.8   Phosphorus (mg/dL)  Date Value  04/09/2020 2.7   Sodium (mmol/L)  Date Value  04/11/2020 139  06/14/2019 138     Assessment: 59 yo female with prolonged hospitalization on lasix drip and albumin with improving serum creatinine and continued electrolyte abnormalities.    Pharmacy has been consulted to monitor/replenish electrolytes.  Patient currently with standing order for KCl 4meq bid on lasix drip  Renal function continuing to improve Scr 2.1>2.08>1.97>1.77>1.63>1.43>1.58  Electrolytes wnl's today  Goal of Therapy:  Electrolytes wnl's  Plan:  K = 4.0   - continue KCl 19meq qd  If K/Scr stable will dc consult tomorrow am  BMP with am labs  Lu Duffel ,PharmD Clinical Pharmacist 04/11/2020 8:05 AM

## 2020-04-11 NOTE — Progress Notes (Signed)
Inpatient Diabetes Program Recommendations  AACE/ADA: New Consensus Statement on Inpatient Glycemic Control (2015)  Target Ranges:  Prepandial:   less than 140 mg/dL      Peak postprandial:   less than 180 mg/dL (1-2 hours)      Critically ill patients:  140 - 180 mg/dL   Lab Results  Component Value Date   GLUCAP 212 (H) 04/11/2020   HGBA1C 6.7 (H) 03/30/2020    Review of Glycemic Control Results for Julie Bender, Julie Bender (MRN [id number]) as of 04/11/2020 12:15  Ref. Range 04/10/2020 08:21 04/10/2020 11:41 04/10/2020 15:50 04/10/2020 20:46 04/11/2020 07:40  Glucose-Capillary Latest Ref Range: 70 - 99 mg/dL 181 (H) 297 (H) 360 (H) 349 (H) 212 (H)   Current orders for Inpatient glycemic control:Levemir 20 units QHS, Novolog 0-9 units TID with meals, Novolog 0-5 units QHS; Solumedrol 0 mg Q24H  Inpatient Diabetes Program Recommendations:    Noted Solumedrol decreased to 20 mg qd -please consider ordering Novolog3units TID with meals for meal coverage if patient eats at least 50% of meals  Thank you, Bethena Roys E. Conna Terada, RN, MSN, CDE  Diabetes Coordinator Inpatient Glycemic Control Team Team Pager [phone] (8am-5pm) 04/11/2020 12:16 PM

## 2020-04-11 NOTE — Plan of Care (Signed)
  Problem: Clinical Measurements: Goal: Respiratory complications will improve Outcome: Progressing Goal: Cardiovascular complication will be avoided Outcome: Progressing   Problem: Pain Managment: Goal: General experience of comfort will improve Outcome: Progressing   Problem: Safety: Goal: Ability to remain free from injury will improve Outcome: Progressing   

## 2020-04-11 NOTE — Progress Notes (Signed)
PROGRESS NOTE    Julie Bender  LOV:[id number] DOB: April 09, 1961 DOA: 03/30/2020 PCP: Deanne Coffer, MD  Assessment & Plan:   Principal Problem:   Acute on chronic diastolic CHF (congestive heart failure) ([place]) Active Problems:   Depression   Elevated troponin   Acute on chronic respiratory failure with hypoxia (HCC)   COPD (chronic obstructive pulmonary disease) ([place])   Hypertension   Type II diabetes mellitus with renal manifestations ([place])   CKD (chronic kidney disease), stage IIIa   Atrial fibrillation, chronic (HCC)   Acute on chronic hypoxic respiratory failure: secondary to pulmonary edema in setting of acute on chronic diastolic CHF exacerbation. Continue on supplemental oxygen.    Acute on chronic diastolic CHF exacerbation: continue on IV lasix drip. Monitor I/Os. Approx neg 2.5L. Echo shows EF 55-60%, grade II diastolic dysfunction. Still w/ significant edema   AKI on CKDIIIb: Cr is labile. 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 No intervention at this time as per vascular surg. Continue eliquis. Present on admission. Likely secondary to previously being on pressors as per pt's sister   Leukocytosis: labile. Will continue to monitor   Elevated troponin: likely secondary to demand ischemia.   QT prolongation: w/ hx of PAF. Continue on metoprolol, amiodarone & eliquis.   Depression: severity unknown. Continue on home dose of zoloft   COPD:continue on steroid taper & bronchodilators. Continue on supplemental oxygen. Encourage incentive spirometry   Hypertension: continue on metoprolol, lasix. IV hydralazine prn   HLD: continue on statin   DM2: HbA1c 6.7 on 03/30/2020. Continue on levemir & SSI w/ accuchecks    ACD: H&H are stable. Will transfuse if Hb <7.0. Will continue to monitor   Severe debility: PT/OT recs SNF. Has not walked since Feb 2021    DVT prophylaxis: eliquis  Code Status:  full  Family Communication:  Disposition Plan: likely d/c to SNF    Status is: Inpatient  Remains inpatient appropriate because:IV treatments appropriate due to intensity of illness or inability to take PO   Dispo:  Patient From: [facility]  Planned Disposition: [facility], go back to [facility]   Expected discharge date: in >3 days  Medically stable for discharge: No    Consultants:   Vascular surg   nephro    Procedures:    Antimicrobials:    Subjective: Pt c/o weakness   Objective: Vitals:   04/11/20 0440 04/11/20 0527 04/11/20 0736 04/11/20 0739  BP: (!) 95/21 (!) 109/26  (!) 110/50  Pulse: 75 74  75  Resp: 20   18  Temp: 99 F (37.2 C)   98.6 F (37 C)  TempSrc: Oral   Axillary  SpO2: 100%  99% 100%  Weight:      Height:        Intake/Output Summary (Last 24 hours) at 04/11/2020 0745 Last data filed at 04/11/2020 0743 Gross per 24 hour  Intake 761.61 ml  Output 3345 ml  Net -2583.39 ml   Filed Weights   04/08/20 0414 04/10/20 0514 04/11/20 0400  Weight: 101.7 kg 101.1 kg 101.4 kg    Examination: General exam: Appears calm and comfortable Respiratory system: decreased breath sounds b/l. No wheezes Cardiovascular system: S1 & S2 +. No rubs, gallops or clicks.  Gastrointestinal system: Abdomen is obese, soft and nontender. Normal bowel sounds heard. Central nervous system: Alert, and awake.  Moves all 4 extremities  Skin: dry gangrene on fingers on both  hands Psychiatry: Judgement and insight appear normal. Flat mood and affect    Data Reviewed: I have personally reviewed following labs and imaging studies  CBC: Recent Labs  Lab 04/06/20 0550 04/07/20 0419 04/08/20 0535 04/10/20 0618 04/11/20 0527  WBC 8.5 11.8* 12.1* 9.3 10.7*  HGB 8.3* 8.3* 8.1* 8.1* 8.1*  HCT 25.7* 24.5* 26.1* 25.8* 24.7*  MCV 80.3 77.3* 80.6 81.4 77.4*  PLT 246 249 255 262 174   Basic Metabolic Panel: Recent Labs  Lab 04/07/20 0419  04/08/20 0535 04/09/20 0505 04/10/20 0618 04/11/20 0527  NA 136 137 138 138 139  K 3.1* 3.0* 3.8 4.1 4.0  CL 92* 93* 95* 95* 92*  CO2 27 30 32 31 35*  GLUCOSE 206* 185* 110* 181* 204*  BUN 57* 54* 50* 47* 48*  CREATININE 1.97* 1.77* 1.63* 1.43* 1.58*  CALCIUM 9.4 9.5 9.5 9.6 9.9  MG  --   --  2.0  --   --   PHOS  --   --  2.7  --   --    GFR: Estimated Creatinine Clearance: 43.3 mL/min (A) (by C-G formula based on SCr of 1.58 mg/dL (H)). Liver Function Tests: Recent Labs  Lab 04/05/20 0546  ALBUMIN 3.8   No results for input(s): LIPASE, AMYLASE in the last 168 hours. No results for input(s): AMMONIA in the last 168 hours. Coagulation Profile: No results for input(s): INR, PROTIME in the last 168 hours. Cardiac Enzymes: No results for input(s): CKTOTAL, CKMB, CKMBINDEX, TROPONINI in the last 168 hours. BNP (last 3 results) No results for input(s): PROBNP in the last 8760 hours. HbA1C: No results for input(s): HGBA1C in the last 72 hours. CBG: Recent Labs  Lab 04/10/20 0821 04/10/20 1141 04/10/20 1550 04/10/20 2046 04/11/20 0740  GLUCAP 181* 297* 360* 349* 212*   Lipid Profile: No results for input(s): CHOL, HDL, LDLCALC, TRIG, CHOLHDL, LDLDIRECT in the last 72 hours. Thyroid Function Tests: No results for input(s): TSH, T4TOTAL, FREET4, T3FREE, THYROIDAB in the last 72 hours. Anemia Panel: No results for input(s): VITAMINB12, FOLATE, FERRITIN, TIBC, IRON, RETICCTPCT in the last 72 hours. Sepsis Labs: No results for input(s): PROCALCITON, LATICACIDVEN in the last 168 hours.  No results found for this or any previous visit (from the past 240 hour(s)).       Radiology Studies: IR Fluoro Guide CV Line Left  Result Date: 04/09/2020 INDICATION: End-stage renal disease, CHF, peripheral vascular disease, no current access EXAM: TUNNELED PICC LINE WITH ULTRASOUND AND FLUOROSCOPIC GUIDANCE MEDICATIONS: 1% lidocaine local. ANESTHESIA/SEDATION: Moderate Sedation Time:   None. The patient was continuously monitored during the procedure by the interventional radiology nurse under my direct supervision. FLUOROSCOPY TIME:  Fluoroscopy Time: 0 minutes 45 seconds (6.76 mGy). COMPLICATIONS: None immediate. PROCEDURE: Informed written consent was obtained from the patient after a discussion of the risks, benefits, and alternatives to treatment. Questions regarding the procedure were encouraged and answered. The right neck and chest were prepped with chlorhexidine in a sterile fashion, and a sterile drape was applied covering the operative field. Maximum barrier sterile technique with sterile gowns and gloves were used for the procedure. A timeout was performed prior to the initiation of the procedure. After creating a small venotomy incision, a micropuncture kit was utilized to access the right external jugular vein under direct, real-time ultrasound guidance after the overlying soft tissues were anesthetized with 1% lidocaine with epinephrine. Ultrasound image documentation was performed. The microwire was kinked to measure appropriate catheter length. The micropuncture sheath  was exchanged for a peel-away sheath over a guidewire. A 5 French dual lumen tunneled PICC measuring 25 cm was tunneled in a retrograde fashion from the anterior chest wall to the venotomy incision. The catheter was then placed through the peel-away sheath with tip ultimately positioned at the superior caval-atrial junction. Final catheter positioning was confirmed and documented with a spot radiographic image. The catheter aspirates and flushes normally. The catheter was flushed with appropriate volume heparin dwells. The catheter exit site was secured with a 3 0 Ethilon retention suture. The venotomy incision was closed with Dermabond. Gel-Foam inserted into the subcutaneous tunnel for hemostasis. Dressings were applied. The patient tolerated the procedure well without immediate post procedural complication.  FINDINGS: After catheter placement, the tip lies within the superior cavoatrial junction. The catheter aspirates and flushes normally and is ready for immediate use. IMPRESSION: Successful placement of 25cm dual lumen tunneled PICC catheter via the right external jugular vein with tip terminating at the superior caval atrial junction. The catheter is ready for immediate use. Electronically Signed   By: Jerilynn Mages.  Shick M.D.   On: 04/09/2020 16:20   [place] EKG SITE RITE  Result Date: 04/09/2020 If Site Rite image not attached, placement could not be confirmed due to current cardiac rhythm.       Scheduled Meds: . amiodarone  200 mg Oral Daily  . apixaban  5 mg Oral BID  . aspirin EC  81 mg Oral Daily  . atorvastatin  20 mg Oral Daily  . Chlorhexidine Gluconate Cloth  6 each Topical Daily  . docusate sodium  100 mg Oral BID  . feeding supplement (NEPRO CARB STEADY)  237 mL Oral BID BM  . ferrous sulfate  325 mg Oral Q breakfast  . gabapentin  100 mg Oral QID  . insulin aspart  0-5 Units Subcutaneous QHS  . insulin aspart  0-9 Units Subcutaneous TID WC  . insulin detemir  20 Units Subcutaneous QHS  . ipratropium-albuterol  3 mL Nebulization TID  . methylPREDNISolone (SOLU-MEDROL) injection  20 mg Intravenous Q24H  . metoprolol succinate  12.5 mg Oral Daily  . multivitamin  1 tablet Oral Daily  . multivitamin with minerals  1 tablet Oral Daily  . mupirocin cream   Topical BID  . pantoprazole  40 mg Oral Daily  . polyethylene glycol  17 g Oral Daily  . potassium chloride  20 mEq Oral Daily  . sertraline  100 mg Oral Daily  . sodium chloride flush  3 mL Intravenous Q12H  . cyanocobalamin  1,000 mcg Oral Daily  . zinc sulfate  220 mg Oral Daily   Continuous Infusions: . sodium chloride 250 mL (04/07/20 2333)  . albumin human Stopped (04/11/20 0454)  . furosemide (LASIX) infusion 10 mg/hr (04/10/20 1733)     LOS: 12 days    Time spent: 32 mins    Wyvonnia Dusky, MD [facility] Pager [phone]  If 7PM-7AM, please contact night-coverage [email or web address] 04/11/2020, 7:45 AM

## 2020-04-11 NOTE — Progress Notes (Signed)
Mobility Specialist - Progress Note   04/11/20 1218  Mobility  Activity Dangled on edge of bed (Bed Exercises )  Level of Assistance Moderate assist, patient does 50-74% (+2 assist, max. assist for balance when sitting EOB)  Assistive Device None  Mobility Response Tolerated fair  Mobility performed by Mobility specialist  $Mobility charge 1 Mobility    Pre-mobility: 74 HR, 105/64 BP, 100% SpO2 During mobility: 75 HR, 90% SpO2  Post-mobility: 75 HR, 124/86 BP, 100% SpO2   Pt long-sitting in bed, taking her medicine upon arrival. Pt utilizing 2L O2 [place]. Pt agreed to mobility session. Pt completed bed exercises SBA. Pt showed improved strength in LE when performing: slr x 5/leg, glute sets x 5/leg, ankle pumps x 5/leg. Pt able to get from supine to sit w/ mod. Assist, +2 assist for safety. Pt needs max. Assist, +2 assist, for balance when dangling EOB. Pt lacks maintaining balance when sitting up and will tend to lean back. Pt encouraged to hold balance at EOB and to use her arms for assist. Pt teared up, stating "I can't do it". Pt states she "can't breath". O2 measured, sitting at 90%. O2 increased to 3L O2 [place]. O2 sat up to 97% immediately. Pt encouraged to be determined and educated on how to maintain stability when sitting EOB. Pt lost motivation and requested to lay back down. Pt mod. Assist sit to supine. Overall, pt tolerated session fair. Pt left long-sitting in bed watching tv using 2L O2 [place], w/ bed alarm set. Call bell and phone placed in reach. Nurse was notified.     Jet Armbrust Mobility Specialist  04/11/20, 12:36 PM

## 2020-04-11 NOTE — Progress Notes (Signed)
[facility]  ROUNDING NOTE   Subjective:  Weight does not appear to be changing despite good diuresis. Urine output yesterday was 2.4 L. Lower extremity edema status about the same.   Objective:  Vital signs in last 24 hours:  Temp:  [98.1 F (36.7 C)-99 F (37.2 C)] 98.6 F (37 C) (08/27 0739) Pulse Rate:  [73-75] 75 (08/27 0739) Resp:  [18-20] 18 (08/27 0739) BP: (95-129)/(21-52) 110/50 (08/27 0739) SpO2:  [92 %-100 %] 100 % (08/27 0739) Weight:  [101.4 kg] 101.4 kg (08/27 0400)  Weight change: 0.338 kg Filed Weights   04/08/20 0414 04/10/20 0514 04/11/20 0400  Weight: 101.7 kg 101.1 kg 101.4 kg    Intake/Output: I/O last 3 completed shifts: In: 881.6 [P.O.:720; I.V.:111.6; IV Piggyback:50] Out: 1610 [Urine:3645]   Intake/Output this shift:  Total I/O In: 240 [P.O.:240] Out: 900 [Urine:900]  Physical Exam: General:  No acute distress  Head:  Normocephalic, atraumatic. Moist oral mucosal membranes  Eyes:  Anicteric  Neck:  Supple  Lungs:   Clear to auscultation, normal effort  Heart:  S1S2 no rubs  Abdomen:   Soft, nontender, bowel sounds present  Extremities:  2+ peripheral edema  Neurologic:  Awake, alert, following commands  Skin:  No lesions       Basic Metabolic Panel: Recent Labs  Lab 04/07/20 0419 04/07/20 0419 04/08/20 0535 04/08/20 0535 04/09/20 0505 04/10/20 0618 04/11/20 0527  NA 136  --  137  --  138 138 139  K 3.1*  --  3.0*  --  3.8 4.1 4.0  CL 92*  --  93*  --  95* 95* 92*  CO2 27  --  30  --  32 31 35*  GLUCOSE 206*  --  185*  --  110* 181* 204*  BUN 57*  --  54*  --  50* 47* 48*  CREATININE 1.97*  --  1.77*  --  1.63* 1.43* 1.58*  CALCIUM 9.4   < > 9.5   < > 9.5 9.6 9.9  MG  --   --   --   --  2.0  --   --   PHOS  --   --   --   --  2.7  --   --    < > = values in this interval not displayed.    Liver Function Tests: Recent Labs  Lab 04/05/20 0546  ALBUMIN 3.8   No results for input(s): LIPASE, AMYLASE in  the last 168 hours. No results for input(s): AMMONIA in the last 168 hours.  CBC: Recent Labs  Lab 04/06/20 0550 04/07/20 0419 04/08/20 0535 04/10/20 0618 04/11/20 0527  WBC 8.5 11.8* 12.1* 9.3 10.7*  HGB 8.3* 8.3* 8.1* 8.1* 8.1*  HCT 25.7* 24.5* 26.1* 25.8* 24.7*  MCV 80.3 77.3* 80.6 81.4 77.4*  PLT 246 249 255 262 295    Cardiac Enzymes: No results for input(s): CKTOTAL, CKMB, CKMBINDEX, TROPONINI in the last 168 hours.  BNP: Invalid input(s): POCBNP  CBG: Recent Labs  Lab 04/10/20 0821 04/10/20 1141 04/10/20 1550 04/10/20 2046 04/11/20 0740  GLUCAP 181* 297* 360* 349* 212*    Microbiology: Results for orders placed or performed during the hospital encounter of 03/30/20  SARS Coronavirus 2 by RT PCR (hospital order, performed in [facility] hospital lab) Nasopharyngeal Nasopharyngeal Swab     Status: None   Collection Time: 03/30/20  1:51 PM   Specimen: Nasopharyngeal Swab  Result Value Ref Range Status   SARS  Coronavirus 2 NEGATIVE NEGATIVE Final    Comment: (NOTE) SARS-CoV-2 target nucleic acids are NOT DETECTED.  The SARS-CoV-2 RNA is generally detectable in upper and lower respiratory specimens during the acute phase of infection. The lowest concentration of SARS-CoV-2 viral copies this assay can detect is 250 copies / mL. A negative result does not preclude SARS-CoV-2 infection and should not be used as the sole basis for treatment or other patient management decisions.  A negative result may occur with improper specimen collection / handling, submission of specimen other than nasopharyngeal swab, presence of viral mutation(s) within the areas targeted by this assay, and inadequate number of viral copies (<250 copies / mL). A negative result must be combined with clinical observations, patient history, and epidemiological information.  Fact Sheet for Patients:   StrictlyIdeas.no  Fact Sheet for Healthcare  Providers: BankingDealers.co.za  This test is not yet approved or  cleared by the [place] FDA and has been authorized for detection and/or diagnosis of SARS-CoV-2 by FDA under an Emergency Use Authorization (EUA).  This EUA will remain in effect (meaning this test can be used) for the duration of the COVID-19 declaration under Section 564(b)(1) of the Act, 21 U.S.C. section 360bbb-3(b)(1), unless the authorization is terminated or revoked sooner.  Performed at [facility], [address] [place], [place] [address]   MRSA PCR Screening     Status: None   Collection Time: 03/30/20 10:23 PM   Specimen: Nasal Mucosa; Nasopharyngeal  Result Value Ref Range Status   MRSA by PCR NEGATIVE NEGATIVE Final    Comment:        The GeneXpert MRSA Assay (FDA approved for NASAL specimens only), is one component of a comprehensive MRSA colonization surveillance program. It is not intended to diagnose MRSA infection nor to guide or monitor treatment for MRSA infections. Performed at [facility], [address] [place], [place] [address]     Coagulation Studies: No results for input(s): LABPROT, INR in the last 72 hours.  Urinalysis: No results for input(s): COLORURINE, LABSPEC, PHURINE, GLUCOSEU, HGBUR, BILIRUBINUR, KETONESUR, PROTEINUR, UROBILINOGEN, NITRITE, LEUKOCYTESUR in the last 72 hours.  Invalid input(s): APPERANCEUR    Imaging: IR Fluoro Guide CV Line Left  Result Date: 04/09/2020 INDICATION: End-stage renal disease, CHF, peripheral vascular disease, no current access EXAM: TUNNELED PICC LINE WITH ULTRASOUND AND FLUOROSCOPIC GUIDANCE MEDICATIONS: 1% lidocaine local. ANESTHESIA/SEDATION: Moderate Sedation Time:  None. The patient was continuously monitored during the procedure by the interventional radiology nurse under my direct supervision. FLUOROSCOPY TIME:  Fluoroscopy Time: 0 minutes 45 seconds (6.76 mGy). COMPLICATIONS: None  immediate. PROCEDURE: Informed written consent was obtained from the patient after a discussion of the risks, benefits, and alternatives to treatment. Questions regarding the procedure were encouraged and answered. The right neck and chest were prepped with chlorhexidine in a sterile fashion, and a sterile drape was applied covering the operative field. Maximum barrier sterile technique with sterile gowns and gloves were used for the procedure. A timeout was performed prior to the initiation of the procedure. After creating a small venotomy incision, a micropuncture kit was utilized to access the right external jugular vein under direct, real-time ultrasound guidance after the overlying soft tissues were anesthetized with 1% lidocaine with epinephrine. Ultrasound image documentation was performed. The microwire was kinked to measure appropriate catheter length. The micropuncture sheath was exchanged for a peel-away sheath over a guidewire. A 5 French dual lumen tunneled PICC measuring 25 cm was tunneled in a retrograde fashion  from the anterior chest wall to the venotomy incision. The catheter was then placed through the peel-away sheath with tip ultimately positioned at the superior caval-atrial junction. Final catheter positioning was confirmed and documented with a spot radiographic image. The catheter aspirates and flushes normally. The catheter was flushed with appropriate volume heparin dwells. The catheter exit site was secured with a 3 0 Ethilon retention suture. The venotomy incision was closed with Dermabond. Gel-Foam inserted into the subcutaneous tunnel for hemostasis. Dressings were applied. The patient tolerated the procedure well without immediate post procedural complication. FINDINGS: After catheter placement, the tip lies within the superior cavoatrial junction. The catheter aspirates and flushes normally and is ready for immediate use. IMPRESSION: Successful placement of 25cm dual lumen tunneled  PICC catheter via the right external jugular vein with tip terminating at the superior caval atrial junction. The catheter is ready for immediate use. Electronically Signed   By: Jerilynn Mages.  Shick M.D.   On: 04/09/2020 16:20     Medications:   . sodium chloride 250 mL (04/07/20 2333)  . albumin human Stopped (04/11/20 0454)  . furosemide (LASIX) infusion 10 mg/hr (04/10/20 1733)   . amiodarone  200 mg Oral Daily  . apixaban  5 mg Oral BID  . aspirin EC  81 mg Oral Daily  . atorvastatin  20 mg Oral Daily  . Chlorhexidine Gluconate Cloth  6 each Topical Daily  . docusate sodium  100 mg Oral BID  . feeding supplement (NEPRO CARB STEADY)  237 mL Oral BID BM  . ferrous sulfate  325 mg Oral Q breakfast  . gabapentin  100 mg Oral QID  . insulin aspart  0-5 Units Subcutaneous QHS  . insulin aspart  0-9 Units Subcutaneous TID WC  . insulin detemir  20 Units Subcutaneous QHS  . ipratropium-albuterol  3 mL Nebulization TID  . methylPREDNISolone (SOLU-MEDROL) injection  20 mg Intravenous Q24H  . metoprolol succinate  12.5 mg Oral Daily  . multivitamin  1 tablet Oral Daily  . multivitamin with minerals  1 tablet Oral Daily  . mupirocin cream   Topical BID  . pantoprazole  40 mg Oral Daily  . polyethylene glycol  17 g Oral Daily  . potassium chloride  20 mEq Oral Daily  . sertraline  100 mg Oral Daily  . sodium chloride flush  3 mL Intravenous Q12H  . cyanocobalamin  1,000 mcg Oral Daily  . zinc sulfate  220 mg Oral Daily   sodium chloride, acetaminophen, albuterol, ALPRAZolam, dextromethorphan-guaiFENesin, hydrALAZINE, ondansetron (ZOFRAN) IV, oxyCODONE, sodium chloride flush  Assessment/ Plan:  59 y.o. female with medical problems of HTN, HLD, DM, COPD, asthma, depression, OSA, ICD placement, diastolic CHF, h/o HOCM s/p myomectomy at [facility], prolonged hospitalization, CKD, A Fib , was admitted on 03/30/2020 with SOB (shortness of breath) [R06.02] Acute on chronic diastolic CHF (congestive heart  failure) ([place]) [I50.33]   #Acute renal failure #Generalized edema R60.1 #Chronic kidney disease stage III B.  Baseline creatinine 1.36, GFR 42-49 April 01, 2020 #Gangrenous changes bilateral hands, left worse than right # DM-2 with CKD # Grade 2 diastolic dysfunction # Hyperkalemia, now resolved  Plan:  Diuretic response has been reasonable however unclear as to how much the patient is truly taking in.  Her weight does not appear to be changing very significantly.  Suspect that this process will continue to take a significant period of time.  Therefore maintain the patient on Lasix drip at 10 mg/h and continue potassium repletion.  LOS: 12 Shelbi Vaccaro 8/27/202112:11 PM

## 2020-04-12 LAB — IRON AND TIBC
Iron: 22 ug/dL — ABNORMAL LOW (ref 28–170)
Saturation Ratios: 8 % — ABNORMAL LOW (ref 10.4–31.8)
TIBC: 263 ug/dL (ref 250–450)
UIBC: 241 ug/dL

## 2020-04-12 LAB — CBC
HCT: 25.9 % — ABNORMAL LOW (ref 36.0–46.0)
Hemoglobin: 8.1 g/dL — ABNORMAL LOW (ref 12.0–15.0)
MCH: 25.4 pg — ABNORMAL LOW (ref 26.0–34.0)
MCHC: 31.3 g/dL (ref 30.0–36.0)
MCV: 81.2 fL (ref 80.0–100.0)
Platelets: 279 10*3/uL (ref 150–400)
RBC: 3.19 MIL/uL — ABNORMAL LOW (ref 3.87–5.11)
RDW: 22 % — ABNORMAL HIGH (ref 11.5–15.5)
WBC: 11.4 10*3/uL — ABNORMAL HIGH (ref 4.0–10.5)
nRBC: 0.3 % — ABNORMAL HIGH (ref 0.0–0.2)

## 2020-04-12 LAB — GLUCOSE, CAPILLARY
Glucose-Capillary: 146 mg/dL — ABNORMAL HIGH (ref 70–99)
Glucose-Capillary: 209 mg/dL — ABNORMAL HIGH (ref 70–99)
Glucose-Capillary: 190 mg/dL — ABNORMAL HIGH (ref 70–99)
Glucose-Capillary: 236 mg/dL — ABNORMAL HIGH (ref 70–99)

## 2020-04-12 LAB — BASIC METABOLIC PANEL
Anion gap: 9 (ref 5–15)
BUN: 44 mg/dL — ABNORMAL HIGH (ref 8–23)
CO2: 34 mmol/L — ABNORMAL HIGH (ref 22–32)
Calcium: 9.4 mg/dL (ref 8.9–10.3)
Chloride: 93 mmol/L — ABNORMAL LOW (ref 98–111)
Creatinine, Ser: 1.39 mg/dL — ABNORMAL HIGH (ref 0.44–1.00)
GFR calc Af Amer: 47 mL/min — ABNORMAL LOW (ref >60)
GFR calc non Af Amer: 41 mL/min — ABNORMAL LOW (ref >60)
Glucose, Bld: 182 mg/dL — ABNORMAL HIGH (ref 70–99)
Potassium: 3.6 mmol/L (ref 3.5–5.1)
Sodium: 136 mmol/L (ref 135–145)

## 2020-04-12 LAB — RETICULOCYTES
Immature Retic Fract: 40.9 % — ABNORMAL HIGH (ref 2.3–15.9)
RBC.: 3.18 MIL/uL — ABNORMAL LOW (ref 3.87–5.11)
Retic Count, Absolute: 97 10*3/uL (ref 19.0–186.0)
Retic Ct Pct: 3.1 % (ref 0.4–3.1)

## 2020-04-12 LAB — FERRITIN: Ferritin: 46 ng/mL (ref 11–307)

## 2020-04-12 LAB — FOLATE: Folate: 88 ng/mL (ref >5.9)

## 2020-04-12 MED ORDER — SODIUM CHLORIDE 0.9% FLUSH
10.0000 mL | INTRAVENOUS | Status: DC | PRN
  Administered 2020-04-12: 20 mL

## 2020-04-12 MED ORDER — SODIUM CHLORIDE 0.9% FLUSH
10.0000 mL | Freq: Two times a day (BID) | INTRAVENOUS | Status: DC
  Administered 2020-04-12 – 2020-04-17 (×11): 10 mL

## 2020-04-12 NOTE — Progress Notes (Signed)
PROGRESS NOTE    Julie Bender  EVO:[id number] DOB: 03-08-1961 DOA: 03/30/2020 PCP: Deanne Coffer, MD  Assessment & Plan:   Principal Problem:   Acute on chronic diastolic CHF (congestive heart failure) ([place]) Active Problems:   Depression   Elevated troponin   Acute on chronic respiratory failure with hypoxia (HCC)   COPD (chronic obstructive pulmonary disease) ([place])   Hypertension   Type II diabetes mellitus with renal manifestations ([place])   CKD (chronic kidney disease), stage IIIa   Atrial fibrillation, chronic (HCC)   Acute on chronic hypoxic respiratory failure: secondary to pulmonary edema in setting of acute on chronic diastolic CHF exacerbation. Continue on supplemental oxygen.    Acute on chronic diastolic CHF exacerbation: continue on IV lasix drip. Monitor I/Os. Approx neg 2.50L today. Echo shows EF 55-60%, grade II diastolic dysfunction. Still w/ significant edema but improved slightly from day prior   AKI on CKDIIIb: Cr is trending down today. 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 No intervention at this time as per vascular surg. Continue eliquis. Present on admission. Likely secondary to previously being on pressors as per pt's sister   Leukocytosis: labile. Will continue to monitor   Elevated troponin: likely secondary to demand ischemia.   QT prolongation: w/ hx of PAF. Continue on metoprolol, amiodarone & eliquis.   Depression: severity unknown. Continue on home dose of zoloft   COPD:continue on steroid taper & bronchodilators. Continue on supplemental oxygen. Encourage incentive spirometry   Hypertension: continue on metoprolol, lasix. IV hydralazine prn   HLD: continue on statin   DM2: HbA1c 6.7 on 03/30/2020. Continue on levemir & SSI w/ accuchecks    ACD: H&H are stable. No need for a transfusion at this time.     Severe debility: PT/OT recs SNF. Has not walked since Feb 2021     DVT prophylaxis: eliquis  Code Status: full  Family Communication:  Disposition Plan: likely d/c to SNF    Status is: Inpatient  Remains inpatient appropriate because:IV treatments appropriate due to intensity of illness or inability to take PO; still on IV lasix drip    Dispo:  Patient From: [place]  Planned Disposition: [place], go back to [facility]   Expected discharge date: in >3 days  Medically stable for discharge: No    Consultants:   Vascular surg   nephro    Procedures:    Antimicrobials:    Subjective: Pt c/o finger pain   Objective: Vitals:   04/12/20 0545 04/12/20 0551 04/12/20 0636 04/12/20 0724  BP:    (!) 142/81  Pulse:    76  Resp:    18  Temp:    98 F (36.7 C)  TempSrc:    Oral  SpO2: (!) 87% 94% 93% 99%  Weight:      Height:        Intake/Output Summary (Last 24 hours) at 04/12/2020 0756 Last data filed at 04/12/2020 0631 Gross per 24 hour  Intake 320 ml  Output 2400 ml  Net -2080 ml   Filed Weights   04/10/20 0514 04/11/20 0400 04/12/20 0346  Weight: 101.1 kg 101.4 kg 98.6 kg    Examination: General exam:Appears calm and comfortable  Respiratory system:diminished breath sounds b/l  Cardiovascular system:S1 &S2 +. No rubs, gallops or clicks.  Gastrointestinal system:Abdomen is obese, soft and nontender. Hypoactivebowel sounds heard. Central nervous system:Alert & awake. Moves all 4 extremities Skin:dry gangrene on  fingers on both hands Psychiatry:Judgement and insight appear normal.Flat mood and affect    Data Reviewed: I have personally reviewed following labs and imaging studies  CBC: Recent Labs  Lab 04/07/20 0419 04/08/20 0535 04/10/20 0618 04/11/20 0527 04/12/20 0554  WBC 11.8* 12.1* 9.3 10.7* 11.4*  HGB 8.3* 8.1* 8.1* 8.1* 8.1*  HCT 24.5* 26.1* 25.8* 24.7* 25.9*  MCV 77.3* 80.6 81.4 77.4* 81.2  PLT 249 255 262 295 211   Basic Metabolic Panel: Recent Labs  Lab  04/07/20 0419 04/08/20 0535 04/09/20 0505 04/10/20 0618 04/11/20 0527  NA 136 137 138 138 139  K 3.1* 3.0* 3.8 4.1 4.0  CL 92* 93* 95* 95* 92*  CO2 27 30 32 31 35*  GLUCOSE 206* 185* 110* 181* 204*  BUN 57* 54* 50* 47* 48*  CREATININE 1.97* 1.77* 1.63* 1.43* 1.58*  CALCIUM 9.4 9.5 9.5 9.6 9.9  MG  --   --  2.0  --   --   PHOS  --   --  2.7  --   --    GFR: Estimated Creatinine Clearance: 42.7 mL/min (A) (by C-G formula based on SCr of 1.58 mg/dL (H)). Liver Function Tests: No results for input(s): AST, ALT, ALKPHOS, BILITOT, PROT, ALBUMIN in the last 168 hours. No results for input(s): LIPASE, AMYLASE in the last 168 hours. No results for input(s): AMMONIA in the last 168 hours. Coagulation Profile: No results for input(s): INR, PROTIME in the last 168 hours. Cardiac Enzymes: No results for input(s): CKTOTAL, CKMB, CKMBINDEX, TROPONINI in the last 168 hours. BNP (last 3 results) No results for input(s): PROBNP in the last 8760 hours. HbA1C: No results for input(s): HGBA1C in the last 72 hours. CBG: Recent Labs  Lab 04/11/20 0740 04/11/20 1246 04/11/20 1705 04/11/20 2150 04/12/20 0725  GLUCAP 212* 268* 220* 242* 146*   Lipid Profile: No results for input(s): CHOL, HDL, LDLCALC, TRIG, CHOLHDL, LDLDIRECT in the last 72 hours. Thyroid Function Tests: No results for input(s): TSH, T4TOTAL, FREET4, T3FREE, THYROIDAB in the last 72 hours. Anemia Panel: No results for input(s): VITAMINB12, FOLATE, FERRITIN, TIBC, IRON, RETICCTPCT in the last 72 hours. Sepsis Labs: No results for input(s): PROCALCITON, LATICACIDVEN in the last 168 hours.  No results found for this or any previous visit (from the past 240 hour(s)).       Radiology Studies: No results found.      Scheduled Meds: . amiodarone  200 mg Oral Daily  . apixaban  5 mg Oral BID  . aspirin EC  81 mg Oral Daily  . atorvastatin  20 mg Oral Daily  . Chlorhexidine Gluconate Cloth  6 each Topical Daily  .  docusate sodium  100 mg Oral BID  . feeding supplement (NEPRO CARB STEADY)  237 mL Oral BID BM  . ferrous sulfate  325 mg Oral Q breakfast  . gabapentin  100 mg Oral QID  . insulin aspart  0-5 Units Subcutaneous QHS  . insulin aspart  0-9 Units Subcutaneous TID WC  . insulin aspart  3 Units Subcutaneous TID WC  . insulin detemir  20 Units Subcutaneous QHS  . ipratropium-albuterol  3 mL Nebulization TID  . methylPREDNISolone (SOLU-MEDROL) injection  20 mg Intravenous Q24H  . metoprolol succinate  12.5 mg Oral Daily  . multivitamin  1 tablet Oral Daily  . multivitamin with minerals  1 tablet Oral Daily  . mupirocin cream   Topical BID  . pantoprazole  40 mg Oral Daily  . polyethylene  glycol  17 g Oral Daily  . potassium chloride  20 mEq Oral Daily  . sertraline  100 mg Oral Daily  . sodium chloride flush  10-40 mL Intracatheter Q12H  . sodium chloride flush  3 mL Intravenous Q12H  . cyanocobalamin  1,000 mcg Oral Daily  . zinc sulfate  220 mg Oral Daily   Continuous Infusions: . sodium chloride 250 mL (04/07/20 2333)  . albumin human 12.5 g (04/12/20 0415)  . furosemide (LASIX) infusion 10 mg/hr (04/11/20 2319)     LOS: 13 days    Time spent: 30 mins    Wyvonnia Dusky, MD [facility] Pager [phone]  If 7PM-7AM, please contact night-coverage [email or web address] 04/12/2020, 7:56 AM

## 2020-04-12 NOTE — Consult Note (Signed)
PHARMACY CONSULT NOTE - FOLLOW UP  Pharmacy Consult for Electrolyte Monitoring and Replacement   Recent Labs: Potassium (mmol/L)  Date Value  04/12/2020 3.6   Magnesium (mg/dL)  Date Value  04/09/2020 2.0   Calcium (mg/dL)  Date Value  04/12/2020 9.4   Albumin (g/dL)  Date Value  04/05/2020 3.8   Phosphorus (mg/dL)  Date Value  04/09/2020 2.7   Sodium (mmol/L)  Date Value  04/12/2020 136  06/14/2019 138     Assessment: 59 yo female with prolonged hospitalization on lasix drip and albumin with improving serum creatinine and continued electrolyte abnormalities.    Pharmacy has been consulted to monitor/replenish electrolytes.  Patient currently with standing order for KCl 9meq bid on lasix drip  Renal function continuing to improve Scr 2.1>2.08>1.97>1.77>1.63>1.43>1.58  Electrolytes wnl's today  Goal of Therapy:  Electrolytes wnl's  Plan:  Pharmacy will sign off. Re-consult if need assistants.   Oswald Hillock ,PharmD Clinical Pharmacist 04/12/2020 8:55 AM

## 2020-04-12 NOTE — Progress Notes (Signed)
Julie Bender  MRN: [id number]  DOB/AGE: 61-Sep-1962 61 y.o.  Primary Care Physician:Firozvi, Mike Craze, MD  Admit date: 03/30/2020  Chief Complaint:  Chief Complaint  Patient presents with  . Shortness of Breath    S-Pt presented on  03/30/2020 with  Chief Complaint  Patient presents with  . Shortness of Breath  . Patient is sitting comfortably in the bed.  Patient voices no new concerns.  Patient son is present in the room. Patient son voices no specific concerns but asked questions about her kidney function.  Patient correlates her illness going back to February when patient went in for heart issues.  Patient was admitted for myectomy at [facility] with a prolonged course and multiple complications. I answered patient and her son's queries to the best of my ability.  Meds . amiodarone  200 mg Oral Daily  . apixaban  5 mg Oral BID  . aspirin EC  81 mg Oral Daily  . atorvastatin  20 mg Oral Daily  . Chlorhexidine Gluconate Cloth  6 each Topical Daily  . docusate sodium  100 mg Oral BID  . feeding supplement (NEPRO CARB STEADY)  237 mL Oral BID BM  . ferrous sulfate  325 mg Oral Q breakfast  . gabapentin  100 mg Oral QID  . insulin aspart  0-5 Units Subcutaneous QHS  . insulin aspart  0-9 Units Subcutaneous TID WC  . insulin aspart  3 Units Subcutaneous TID WC  . insulin detemir  20 Units Subcutaneous QHS  . ipratropium-albuterol  3 mL Nebulization TID  . methylPREDNISolone (SOLU-MEDROL) injection  20 mg Intravenous Q24H  . metoprolol succinate  12.5 mg Oral Daily  . multivitamin  1 tablet Oral Daily  . multivitamin with minerals  1 tablet Oral Daily  . mupirocin cream   Topical BID  . pantoprazole  40 mg Oral Daily  . polyethylene glycol  17 g Oral Daily  . potassium chloride  20 mEq Oral Daily  . sertraline  100 mg Oral Daily  . sodium chloride flush  10-40 mL Intracatheter Q12H  . sodium chloride flush  3 mL Intravenous Q12H  . cyanocobalamin  1,000 mcg Oral Daily  . zinc  sulfate  220 mg Oral Daily         [id number] from the symptoms mentioned above,there are no other symptoms referable to all systems reviewed.  Physical Exam: Vital signs in last 24 hours: Temp:  [98 F (36.7 C)-98.7 F (37.1 C)] 98 F (36.7 C) (08/28 0724) Pulse Rate:  [75-76] 76 (08/28 0724) Resp:  [16-20] 18 (08/28 0724) BP: (108-142)/(21-94) 142/81 (08/28 0724) SpO2:  [87 %-100 %] 99 % (08/28 0724) Weight:  [98.6 kg] 98.6 kg (08/28 0346) Weight change: -2.8 kg Last BM Date: 04/10/20  Intake/Output from previous day: 08/27 0701 - 08/28 0700 In: 320 [P.O.:240; I.V.:80] Out: 3300 [Urine:3300] No intake/output data recorded.   Physical Exam: General- pt is awake, follows commands Resp- No acute REsp distress, decreased breath sounds at bases CVS- S1S2 regular in rate and rhythm GIT- BS+, soft, NT, ND EXT- 2+ LE Edema, Cyanosis Left upper extremity fingers gangrene Right upper extremity fingertip injury  Lab Results: CBC Recent Labs    04/11/20 0527 04/12/20 0554  WBC 10.7* 11.4*  HGB 8.1* 8.1*  HCT 24.7* 25.9*  PLT 295 279    BMET Recent Labs    04/11/20 0527 04/12/20 0554  NA 139 136  K 4.0 3.6  CL 92* 93*  CO2 35* 34*  GLUCOSE 204* 182*  BUN 48* 44*  CREATININE 1.58* 1.39*  CALCIUM 9.9 9.4   Creatinine trend 2021   1.4==>2.3==>1.4 2020 1.0--1.13    MICRO No results found for this or any previous visit (from the past 240 hour(s)).    Lab Results  Component Value Date   PTH 123 (H) 04/05/2020   CALCIUM 9.4 04/12/2020   PHOS 2.7 04/09/2020               Impression:    59 y.o. female with medical problems of HTN, HLD, DM, COPD, asthma, depression, OSA, ICD placement, diastolic CHF, h/o HOCM s/p myomectomy at [facility], prolonged hospitalization, CKD, A Fib , was admitted on 8/15/2021with SOB (shortness of breath) [R06.02] Acute on chronic diastolic CHF (congestive heart failure) ([place]) [I50.33]   1)Renal  AKI secondary to  ATN Patient has AKI secondary to multiple factors Patient AKI is now improving Patient has AKI on CKD. Patient has CKD stage IIIb Patient has CKD stage IIIb most likely secondary to multiple factors Patient has CKD secondary to diabetes mellitus/hypertension  Patient AKI is better   2)HTN Blood pressure is at goal   3)Anemia of chronic disease  HGb is not at goal (9--11)   4) secondary hyperparathyroidism -CKD Mineral-Bone Disorder   Secondary Hyperparathyroidism  present Phosphorus at goal.   5) generalized edemaR60.1 Patient is currently on IV diuretics Since admission patient is around 14 L negative  6) electrolytes   sodium Normonatremic   potassium Normokalemic Patient was hyperkalemic earlier   7)Acid base Co2 at goal  8) diabetes mellitus type 2 Pt is being  closely follow with the primary team   Plan:  We will ask for anemia profile Will continue current diuresis.  I answered patient and her son's queries to the best of my ability.    Sofhia Ulibarri s Theador Hawthorne 04/12/2020, 10:12 AM

## 2020-04-13 LAB — CBC
HCT: 25.3 % — ABNORMAL LOW (ref 36.0–46.0)
Hemoglobin: 7.8 g/dL — ABNORMAL LOW (ref 12.0–15.0)
MCH: 25.2 pg — ABNORMAL LOW (ref 26.0–34.0)
MCHC: 30.8 g/dL (ref 30.0–36.0)
MCV: 81.6 fL (ref 80.0–100.0)
Platelets: 264 10*3/uL (ref 150–400)
RBC: 3.1 MIL/uL — ABNORMAL LOW (ref 3.87–5.11)
RDW: 21.3 % — ABNORMAL HIGH (ref 11.5–15.5)
WBC: 10.9 10*3/uL — ABNORMAL HIGH (ref 4.0–10.5)
nRBC: 0.2 % (ref 0.0–0.2)

## 2020-04-13 LAB — GLUCOSE, CAPILLARY
Glucose-Capillary: 177 mg/dL — ABNORMAL HIGH (ref 70–99)
Glucose-Capillary: 315 mg/dL — ABNORMAL HIGH (ref 70–99)
Glucose-Capillary: 249 mg/dL — ABNORMAL HIGH (ref 70–99)
Glucose-Capillary: 221 mg/dL — ABNORMAL HIGH (ref 70–99)

## 2020-04-13 LAB — BASIC METABOLIC PANEL
Anion gap: 12 (ref 5–15)
BUN: 39 mg/dL — ABNORMAL HIGH (ref 8–23)
CO2: 32 mmol/L (ref 22–32)
Calcium: 9.4 mg/dL (ref 8.9–10.3)
Chloride: 92 mmol/L — ABNORMAL LOW (ref 98–111)
Creatinine, Ser: 1.34 mg/dL — ABNORMAL HIGH (ref 0.44–1.00)
GFR calc Af Amer: 49 mL/min — ABNORMAL LOW (ref >60)
GFR calc non Af Amer: 43 mL/min — ABNORMAL LOW (ref >60)
Glucose, Bld: 184 mg/dL — ABNORMAL HIGH (ref 70–99)
Potassium: 3.4 mmol/L — ABNORMAL LOW (ref 3.5–5.1)
Sodium: 136 mmol/L (ref 135–145)

## 2020-04-13 LAB — VITAMIN B12: Vitamin B-12: 897 pg/mL (ref 180–914)

## 2020-04-13 NOTE — Plan of Care (Signed)
  Problem: Education: Goal: Knowledge of General Education information will improve Description: Including pain rating scale, medication(s)/side effects and non-pharmacologic comfort measures Outcome: Progressing   Problem: Clinical Measurements: Goal: Cardiovascular complication will be avoided Outcome: Progressing   Problem: Pain Managment: Goal: General experience of comfort will improve Outcome: Progressing   Problem: Safety: Goal: Ability to remain free from injury will improve Outcome: Progressing   Problem: Clinical Measurements: Goal: Respiratory complications will improve Outcome: Progressing

## 2020-04-13 NOTE — Progress Notes (Signed)
PROGRESS NOTE    Julie Bender  [name] DOB: June 14, 1961 DOA: 03/30/2020 PCP: Julie Coffer, MD  Assessment & Plan:   Principal Problem:   Acute on chronic diastolic CHF (congestive heart failure) ([name]) Active Problems:   Depression   Elevated troponin   Acute on chronic respiratory failure with hypoxia (HCC)   COPD (chronic obstructive pulmonary disease) ([name])   Hypertension   Type II diabetes mellitus with renal manifestations ([name])   CKD (chronic kidney disease), stage IIIa   Atrial fibrillation, chronic (HCC)   Acute on chronic hypoxic respiratory failure: secondary to pulmonary edema in setting of acute on chronic diastolic CHF exacerbation. Continue on supplemental oxygen.    Acute on chronic diastolic CHF exacerbation: continue on IV lasix drip. Monitor I/Os. Approx neg 1.3 L. Echo shows EF 55-60%, grade II diastolic dysfunction. Edema still present but improving   AKI on CKDIIIb: Cr continues to trend down. 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 No intervention at this time as per vascular surg. Continue eliquis. Present on admission. Likely secondary to previously being on pressors as per pt's sister   Leukocytosis: labile. Will continue to monitor   Elevated troponin: likely secondary to demand ischemia.   QT prolongation: w/ hx of PAF. Continue on metoprolol, amiodarone & eliquis.   Depression: severity unknown. Continue on home dose of zoloft   COPD:continue on steroid taper & bronchodilators. Continue on supplemental oxygen. Encourage incentive spirometry   Hypertension: continue on metoprolol, lasix. IV hydralazine prn   HLD: continue on statin   DM2: HbA1c 6.7 on 03/30/2020. Continue on levemir & SSI w/ accuchecks    ACD: H&H are trending down slightly today. Will transfuse if Hb <7   Severe debility: PT/OT recs SNF. Has not walked since Feb 2021    DVT prophylaxis: eliquis    Code Status: full  Family Communication:  Disposition Plan: likely d/c to SNF    Status is: Inpatient  Remains inpatient appropriate because:IV treatments appropriate due to intensity of illness or inability to take PO; still on IV lasix drip    Dispo:  Patient From: [place]  Planned Disposition: [place], go back to [facility]   Expected discharge date: in >3 days  Medically stable for discharge: No    Consultants:   Vascular surg   nephro    Procedures:    Antimicrobials:    Subjective: Pt c/o finger pain on both hands   Objective: Vitals:   04/13/20 0634 04/13/20 0741 04/13/20 0816 04/13/20 1208  BP: (!) 122/26  124/76 113/60  Pulse: 74  75 74  Resp:   19 19  Temp: 98.6 F (37 C)  98.7 F (37.1 C) 97.8 F (36.6 C)  TempSrc: Oral  Oral   SpO2: 100% 100% 98% 98%  Weight:      Height:        Intake/Output Summary (Last 24 hours) at 04/13/2020 1301 Last data filed at 04/13/2020 1000 Gross per 24 hour  Intake 656.65 ml  Output 2000 ml  Net -1343.35 ml   Filed Weights   04/11/20 0400 04/12/20 0346 04/13/20 0520  Weight: 101.4 kg 98.6 kg 102.2 kg    Examination: General exam:Appears calm and comfortable  Respiratory system:diminished breath sounds b/l. No rhonchi Cardiovascular system:S1 &S2 +. No rubs, gallops or clicks.  Gastrointestinal system:Abdomen is obese, soft and nontender. hypoactive bowel sounds heard. Central nervous system:Alert & awake. Moves all 4  extremities Skin:dry gangrene on fingers of both hands Psychiatry:Judgement and insight appear normal.Flat mood and affect    Data Reviewed: I have personally reviewed following labs and imaging studies  CBC: Recent Labs  Lab 04/08/20 0535 04/10/20 0618 04/11/20 0527 04/12/20 0554 04/13/20 0431  WBC 12.1* 9.3 10.7* 11.4* 10.9*  HGB 8.1* 8.1* 8.1* 8.1* 7.8*  HCT 26.1* 25.8* 24.7* 25.9* 25.3*  MCV 80.6 81.4 77.4* 81.2 81.6  PLT 255 262 295  279 517   Basic Metabolic Panel: Recent Labs  Lab 04/09/20 0505 04/10/20 0618 04/11/20 0527 04/12/20 0554 04/13/20 0431  NA 138 138 139 136 136  K 3.8 4.1 4.0 3.6 3.4*  CL 95* 95* 92* 93* 92*  CO2 32 31 35* 34* 32  GLUCOSE 110* 181* 204* 182* 184*  BUN 50* 47* 48* 44* 39*  CREATININE 1.63* 1.43* 1.58* 1.39* 1.34*  CALCIUM 9.5 9.6 9.9 9.4 9.4  MG 2.0  --   --   --   --   PHOS 2.7  --   --   --   --    GFR: Estimated Creatinine Clearance: 51.3 mL/min (A) (by C-G formula based on SCr of 1.34 mg/dL (H)). Liver Function Tests: No results for input(s): AST, ALT, ALKPHOS, BILITOT, PROT, ALBUMIN in the last 168 hours. No results for input(s): LIPASE, AMYLASE in the last 168 hours. No results for input(s): AMMONIA in the last 168 hours. Coagulation Profile: No results for input(s): INR, PROTIME in the last 168 hours. Cardiac Enzymes: No results for input(s): CKTOTAL, CKMB, CKMBINDEX, TROPONINI in the last 168 hours. BNP (last 3 results) No results for input(s): PROBNP in the last 8760 hours. HbA1C: No results for input(s): HGBA1C in the last 72 hours. CBG: Recent Labs  Lab 04/12/20 1131 04/12/20 1654 04/12/20 2140 04/13/20 0815 04/13/20 1208  GLUCAP 209* 190* 236* 177* 315*   Lipid Profile: No results for input(s): CHOL, HDL, LDLCALC, TRIG, CHOLHDL, LDLDIRECT in the last 72 hours. Thyroid Function Tests: No results for input(s): TSH, T4TOTAL, FREET4, T3FREE, THYROIDAB in the last 72 hours. Anemia Panel: Recent Labs    04/12/20 0554 04/12/20 2020  VITAMINB12  --  897  FOLATE 88.0  --   FERRITIN 46  --   TIBC 263  --   IRON 22*  --   RETICCTPCT 3.1  --    Sepsis Labs: No results for input(s): PROCALCITON, LATICACIDVEN in the last 168 hours.  No results found for this or any previous visit (from the past 240 hour(s)).       Radiology Studies: No results found.      Scheduled Meds: . amiodarone  200 mg Oral Daily  . apixaban  5 mg Oral BID  . aspirin  EC  81 mg Oral Daily  . atorvastatin  20 mg Oral Daily  . Chlorhexidine Gluconate Cloth  6 each Topical Daily  . docusate sodium  100 mg Oral BID  . feeding supplement (NEPRO CARB STEADY)  237 mL Oral BID BM  . ferrous sulfate  325 mg Oral Q breakfast  . gabapentin  100 mg Oral QID  . insulin aspart  0-5 Units Subcutaneous QHS  . insulin aspart  0-9 Units Subcutaneous TID WC  . insulin aspart  3 Units Subcutaneous TID WC  . insulin detemir  20 Units Subcutaneous QHS  . ipratropium-albuterol  3 mL Nebulization TID  . methylPREDNISolone (SOLU-MEDROL) injection  20 mg Intravenous Q24H  . metoprolol succinate  12.5 mg Oral Daily  .  multivitamin  1 tablet Oral Daily  . multivitamin with minerals  1 tablet Oral Daily  . mupirocin cream   Topical BID  . pantoprazole  40 mg Oral Daily  . polyethylene glycol  17 g Oral Daily  . potassium chloride  20 mEq Oral Daily  . sertraline  100 mg Oral Daily  . sodium chloride flush  10-40 mL Intracatheter Q12H  . sodium chloride flush  3 mL Intravenous Q12H  . cyanocobalamin  1,000 mcg Oral Daily  . zinc sulfate  220 mg Oral Daily   Continuous Infusions: . sodium chloride 250 mL (04/07/20 2333)  . albumin human Stopped (04/13/20 0541)  . furosemide (LASIX) infusion 10 mg/hr (04/12/20 2116)     LOS: 14 days    Time spent: 33 mins    Wyvonnia Dusky, MD [facility] Pager [phone]  If 7PM-7AM, please contact night-coverage [email or web address] 04/13/2020, 1:01 PM

## 2020-04-13 NOTE — Progress Notes (Signed)
Brandice Busser  MRN: [id number]  DOB/AGE: 61-Oct-1962 61 y.o.  Primary Care Physician:Firozvi, Mike Craze, MD  Admit date: 03/30/2020  Chief Complaint:  Chief Complaint  Patient presents with  . Shortness of Breath    S-Pt presented on  03/30/2020 with  Chief Complaint  Patient presents with  . Shortness of Breath  . Patient main concern today visit was "I have pain in my hands." Patient offers no complaint of shortness of breath I then communicated with patient's care team about need for her pain medications   Meds . amiodarone  200 mg Oral Daily  . apixaban  5 mg Oral BID  . aspirin EC  81 mg Oral Daily  . atorvastatin  20 mg Oral Daily  . Chlorhexidine Gluconate Cloth  6 each Topical Daily  . docusate sodium  100 mg Oral BID  . feeding supplement (NEPRO CARB STEADY)  237 mL Oral BID BM  . ferrous sulfate  325 mg Oral Q breakfast  . gabapentin  100 mg Oral QID  . insulin aspart  0-5 Units Subcutaneous QHS  . insulin aspart  0-9 Units Subcutaneous TID WC  . insulin aspart  3 Units Subcutaneous TID WC  . insulin detemir  20 Units Subcutaneous QHS  . ipratropium-albuterol  3 mL Nebulization TID  . methylPREDNISolone (SOLU-MEDROL) injection  20 mg Intravenous Q24H  . metoprolol succinate  12.5 mg Oral Daily  . multivitamin  1 tablet Oral Daily  . multivitamin with minerals  1 tablet Oral Daily  . mupirocin cream   Topical BID  . pantoprazole  40 mg Oral Daily  . polyethylene glycol  17 g Oral Daily  . potassium chloride  20 mEq Oral Daily  . sertraline  100 mg Oral Daily  . sodium chloride flush  10-40 mL Intracatheter Q12H  . sodium chloride flush  3 mL Intravenous Q12H  . cyanocobalamin  1,000 mcg Oral Daily  . zinc sulfate  220 mg Oral Daily         EXH:BZJIR from the symptoms mentioned above,there are no other symptoms referable to all systems reviewed.  Physical Exam: Vital signs in last 24 hours: Temp:  [97.5 F (36.4 C)-98.7 F (37.1 C)] 98.7 F (37.1 C)  (08/29 0816) Pulse Rate:  [73-75] 75 (08/29 0816) Resp:  [19-20] 19 (08/29 0816) BP: (111-131)/(26-76) 124/76 (08/29 0816) SpO2:  [98 %-100 %] 98 % (08/29 0816) Weight:  [102.2 kg] 102.2 kg (08/29 0520) Weight change: 3.595 kg Last BM Date: 04/12/20  Intake/Output from previous day: 08/28 0701 - 08/29 0700 In: 776.7 [P.O.:600; I.V.:126.7; IV Piggyback:50] Out: 1700 [Urine:1700] Total I/O In: -  Out: 800 [Urine:800]   Physical Exam: General- pt is awake, follows commands Resp- No acute REsp distress, decreased breath sounds at bases CVS- S1S2 regular in rate and rhythm GIT- BS+, soft, NT, ND EXT- 2+ LE Edema, Cyanosis Left upper extremity fingers gangrene Right upper extremity fingertip injury  Lab Results: CBC Recent Labs    04/12/20 0554 04/13/20 0431  WBC 11.4* 10.9*  HGB 8.1* 7.8*  HCT 25.9* 25.3*  PLT 279 264    BMET Recent Labs    04/12/20 0554 04/13/20 0431  NA 136 136  K 3.6 3.4*  CL 93* 92*  CO2 34* 32  GLUCOSE 182* 184*  BUN 44* 39*  CREATININE 1.39* 1.34*  CALCIUM 9.4 9.4   Creatinine trend 2021   1.4==>2.3==>1.4 2020 1.0--1.13    MICRO No results found for this or any previous visit (  from the past 240 hour(s)).    Lab Results  Component Value Date   PTH 123 (H) 04/05/2020   CALCIUM 9.4 04/13/2020   PHOS 2.7 04/09/2020               Impression:    59 y.o. female with medical problems of HTN, HLD, DM, COPD, asthma, depression, OSA, ICD placement, diastolic CHF, h/o HOCM s/p myomectomy at [facility], prolonged hospitalization, CKD, A Fib , was admitted on 8/15/2021with SOB (shortness of breath) [R06.02] Acute on chronic diastolic CHF (congestive heart failure) ([place]) [I50.33]   1)Renal  AKI secondary to ATN Patient has AKI secondary to multiple factors Patient AKI is now improving Patient has AKI on CKD. Patient has CKD stage IIIb Patient has CKD stage IIIb most likely secondary to multiple factors Patient has CKD  secondary to diabetes mellitus/hypertension  Patient AKI is better   2)HTN Blood pressure is at goal   3)Anemia of chronic disease and a component of iron deficiency anemia  HGb is not at goal (9--11)  Results for ALIVIA, CIMINO (MRN [id number]) as of 04/13/2020 10:35  Ref. Range 04/12/2020 05:54  Iron Latest Ref Range: 28 - 170 ug/dL 22 (L)  UIBC Latest Units: ug/dL 241  TIBC Latest Ref Range: 250 - 450 ug/dL 263  Saturation Ratios Latest Ref Range: 10.4 - 31.8 % 8 (L)  Ferritin Latest Ref Range: 11 - 307 ng/mL 46  Folate Latest Ref Range: >5.9 ng/mL 88.0    On PO  iron  4) secondary hyperparathyroidism -[facility] Mineral-Bone Disorder   Secondary Hyperparathyroidism  present Phosphorus at goal.   5) generalized edemaR60.1 Patient is currently on IV diuretics Since admission patient is around 14 L negative  6) electrolytes   sodium Normonatremic   potassium Hypo-kalemic Patient was hyperkalemic earlier Being replete  7)Acid base Co2 at goal  8) diabetes mellitus type 2 Pt is being  closely follow with the primary team   Plan:   On PO Iron Will continue current tx      Ghazal Pevey s Daylah Sayavong 04/13/2020, 10:35 AM

## 2020-04-14 LAB — CBC
HCT: 25.1 % — ABNORMAL LOW (ref 36.0–46.0)
Hemoglobin: 7.8 g/dL — ABNORMAL LOW (ref 12.0–15.0)
MCH: 25.3 pg — ABNORMAL LOW (ref 26.0–34.0)
MCHC: 31.1 g/dL (ref 30.0–36.0)
MCV: 81.5 fL (ref 80.0–100.0)
Platelets: 231 10*3/uL (ref 150–400)
RBC: 3.08 MIL/uL — ABNORMAL LOW (ref 3.87–5.11)
RDW: 21.7 % — ABNORMAL HIGH (ref 11.5–15.5)
WBC: 9.7 10*3/uL (ref 4.0–10.5)
nRBC: 0 % (ref 0.0–0.2)

## 2020-04-14 LAB — BASIC METABOLIC PANEL
Anion gap: 8 (ref 5–15)
BUN: 37 mg/dL — ABNORMAL HIGH (ref 8–23)
CO2: 34 mmol/L — ABNORMAL HIGH (ref 22–32)
Calcium: 9.3 mg/dL (ref 8.9–10.3)
Chloride: 95 mmol/L — ABNORMAL LOW (ref 98–111)
Creatinine, Ser: 1.3 mg/dL — ABNORMAL HIGH (ref 0.44–1.00)
GFR calc Af Amer: 51 mL/min — ABNORMAL LOW (ref >60)
GFR calc non Af Amer: 44 mL/min — ABNORMAL LOW (ref >60)
Glucose, Bld: 93 mg/dL (ref 70–99)
Potassium: 3.5 mmol/L (ref 3.5–5.1)
Sodium: 137 mmol/L (ref 135–145)

## 2020-04-14 LAB — GLUCOSE, CAPILLARY
Glucose-Capillary: 63 mg/dL — ABNORMAL LOW (ref 70–99)
Glucose-Capillary: 146 mg/dL — ABNORMAL HIGH (ref 70–99)
Glucose-Capillary: 163 mg/dL — ABNORMAL HIGH (ref 70–99)
Glucose-Capillary: 209 mg/dL — ABNORMAL HIGH (ref 70–99)

## 2020-04-14 MED ORDER — INSULIN DETEMIR 100 UNIT/ML ~~LOC~~ SOLN
17.0000 [IU] | Freq: Every day | SUBCUTANEOUS | Status: DC
  Administered 2020-04-14 – 2020-04-16 (×3): 17 [IU] via SUBCUTANEOUS
  Filled 2020-04-14 (×4): qty 0.17

## 2020-04-14 MED ORDER — INSULIN ASPART 100 UNIT/ML ~~LOC~~ SOLN
6.0000 [IU] | Freq: Three times a day (TID) | SUBCUTANEOUS | Status: DC
  Administered 2020-04-15 – 2020-04-17 (×8): 6 [IU] via SUBCUTANEOUS
  Filled 2020-04-14 (×9): qty 1

## 2020-04-14 NOTE — Evaluation (Signed)
Physical Therapy Re-Evaluation Patient Details Name: Julie Bender MRN: [id number] DOB: 04/02/1961 Today's Date: 04/14/2020   History of Present Illness  Pt is a 59 y.o. female with PMH significant of htn, HLD, DM, COPD on 3 L O2, asthma, depression, OSA, ICD placement, dCHF, HOCM s/p 09/2019 [facility] myectomy with a prolonged hospital course and multiple complications, toe amputations B, CKD-III, a-fib on Eliquis, who presents with a CHF exacerbation and acute respiratory failure with hypoxia.  Pt admitted from [facility].  Clinical Impression  PT re-evaluation performed d/t extended hospitalization.  PT/OT co-treat.  Prior to hospital admission, pt was at [facility] following previous extended hospitalization.  Currently pt is max assist x2 with bed mobility and SBA with sitting balance x20 minutes edge of bed (working on upright posture with cueing, LE ex's, and activities with OT).  Pt reporting this was the best she felt recently (pt still SOB at times requiring rest breaks but improved compared to previous sessions).  Pt would benefit from skilled PT to address noted impairments and functional limitations (see below for any additional details).  Upon hospital discharge, pt would benefit from STR.  POC reviewed and updated as appropriate.    Follow Up Recommendations SNF    Equipment Recommendations  Other (comment) (TBD at next facility)    Recommendations for Other Services       Precautions / Restrictions Precautions Precautions: Fall Precaution Comments: R PICC Restrictions Weight Bearing Restrictions: No      Mobility  Bed Mobility Overal bed mobility: Needs Assistance Bed Mobility: Supine to Sit;Rolling;Sit to Supine Rolling: Mod assist;Max assist;+2 for safety/equipment (logrolling L and R in bed to use bed pan end of session)   Supine to sit: Max assist;+2 for physical assistance Sit to supine: Max assist;+2 for physical assistance   General bed mobility comments: vc's for UE placement  and overall technique  Transfers                    Ambulation/Gait                Stairs            Wheelchair Mobility    Modified Rankin (Stroke Patients Only)       Balance Overall balance assessment: Needs assistance Sitting-balance support: No upper extremity supported;Feet supported Sitting balance-Leahy Scale: Fair Sitting balance - Comments: steady static sitting                                     Pertinent Vitals/Pain Pain Assessment: 0-10 Pain Score: 4  (increased from 0/10 to 6/10 sitting edge of bed but decreased to 4/10 end of session at rest) Pain Location: posterior R thigh and lower back Pain Descriptors / Indicators: Sore;Discomfort Pain Intervention(s): Limited activity within patient's tolerance;Monitored during session;Repositioned  Vitals (HR and O2 on supplemental O2 via nasal cannula) stable and WFL throughout treatment session.    Home Living Family/patient expects to be discharged to:: Private residence Living Arrangements: Other relatives (Sister and mother) Available Help at Discharge: Family Type of Home: House Home Access: Stairs to enter Entrance Stairs-Rails: None Entrance Stairs-Number of Steps: 5-6 from back door or 8 steps from front Home Layout: One level Home Equipment: Clinical cytogeneticist - 2 wheels Additional Comments: Pt most recently at [facility]    Prior Function     Gait / Transfers Assistance Needed: Required assist for transfers (intermittent use  of mechanical lift to get OOB to chair)           Hand Dominance   Dominant Hand: Right    Extremity/Trunk Assessment   Upper Extremity Assessment Upper Extremity Assessment: Defer to OT evaluation;Generalized weakness    Lower Extremity Assessment Lower Extremity Assessment:  (at least 3/5 ankle DF/PF, knee flexion/extension, and hip flexion)    Cervical / Trunk Assessment Cervical / Trunk Assessment: Other exceptions (forward  shoulders and head)  Communication   Communication: No difficulties  Cognition Arousal/Alertness: Awake/alert Behavior During Therapy: WFL for tasks assessed/performed Overall Cognitive Status: Within Functional Limits for tasks assessed                                        General Comments   Nursing cleared pt for participation in physical therapy.  Pt agreeable to PT/OT session.    Exercises General Exercises - Lower Extremity Ankle Circles/Pumps: AROM;Strengthening;Both;10 reps;Seated Long Arc Quad: AROM;Strengthening;Both;10 reps;Seated   Assessment/Plan    PT Assessment Patient needs continued PT services  PT Problem List Decreased strength;Decreased activity tolerance;Decreased mobility;Decreased balance;Decreased safety awareness;Decreased knowledge of precautions;Cardiopulmonary status limiting activity;Decreased knowledge of use of DME;Pain       PT Treatment Interventions DME instruction;Gait training;Functional mobility training;Therapeutic activities;Therapeutic exercise;Balance training;Patient/family education    PT Goals (Current goals can be found in the Care Plan section)  Acute Rehab PT Goals Patient Stated Goal: to complete rehab and return home  PT Goal Formulation: With patient Time For Goal Achievement: 04/28/20 Potential to Achieve Goals: Fair    Frequency Min 2X/week   Barriers to discharge Decreased caregiver support      Co-evaluation PT/OT/SLP Co-Evaluation/Treatment: Yes Reason for Co-Treatment: Complexity of the patient's impairments (multi-system involvement);For patient/therapist safety;To address functional/ADL transfers PT goals addressed during session: Mobility/safety with mobility;Balance;Strengthening/ROM OT goals addressed during session: ADL's and self-care;Proper use of Adaptive equipment and DME       AM-PAC PT "6 Clicks" Mobility  Outcome Measure Help needed turning from your back to your side while in a flat  bed without using bedrails?: A Lot Help needed moving from lying on your back to sitting on the side of a flat bed without using bedrails?: Total Help needed moving to and from a bed to a chair (including a wheelchair)?: Total Help needed standing up from a chair using your arms (e.g., wheelchair or bedside chair)?: Total Help needed to walk in hospital room?: Total Help needed climbing 3-5 steps with a railing? : Total 6 Click Score: 7    End of Session Equipment Utilized During Treatment: Gait belt;Oxygen Activity Tolerance: Patient limited by fatigue Patient left: in bed;with call bell/phone within reach;with bed alarm set;Other (comment) (pt slightly rolled towards R side with pillows supporting pt on L side; B heels floating via pillow support) Nurse Communication: Precautions PT Visit Diagnosis: Unsteadiness on feet (R26.81);Muscle weakness (generalized) (M62.81);Other abnormalities of gait and mobility (R26.89)    Time: 2993-7169 PT Time Calculation (min) (ACUTE ONLY): 49 min   Charges:   PT Evaluation $PT Re-evaluation: 1 Re-eval PT Treatments $Therapeutic Exercise: 8-22 mins $Therapeutic Activity: 8-22 mins       Leitha Bleak, PT 04/14/20, 5:23 PM

## 2020-04-14 NOTE — Care Management Important Message (Signed)
Important Message  Patient Details  Name: Julie Bender MRN: [id number] Date of Birth: 1961-01-27   Medicare Important Message Given:  Yes     Dannette Barbara 04/14/2020, 1:37 PM

## 2020-04-14 NOTE — Progress Notes (Signed)
Occupational Therapy Treatment Patient Details Name: Julie Bender MRN: [id number] DOB: July 21, 1961 Today's Date: 04/14/2020    History of present illness Pt is a 59 y.o. female with PMH significant of htn, HLD, DM, COPD on 3 L O2, asthma, depression, OSA, ICD placement, dCHF, HOCM s/p 09/2019 [facility] myectomy with a prolonged hospital course and multiple complications, toe amputations B, CKD-III, a-fib on Eliquis, who presents with a CHF exacerbation and acute respiratory failure with hypoxia.  Pt admitted from [facility].   OT comments  Pt seen for OT/PT co-tx this date. Pt seated EOB endorsing 4/10 R posterior thigh and low back pain. Pt agreeable to seated grooming tasks requiring set up for toothpaste application 2/2 limited pinch/grip strength/hand pain 2/2 gangrene. Pt able to maintain static balance with supervision for safety. Son present and verbalizes encouragement to witness this progress. Pt's son reports that approx 2 months ago, he had to brush the pt's teeth for her. Pt also pleased by progress. Ultimately, pt tolerated 20 min sitting EOB (portion completed prior to OT's arrival). Pain up to 6/10, improved to 4/10 once supine in bed at end of session. Log rolling for bed level toileting task requiring max assist for pericare and pillows positioned under L hip/side for improved comfort and pressure relief. Pt progressing towards goals, continues to benefit from skilled OT services. Continue to recommend SNF for additional rehab services.    Follow Up Recommendations  SNF    Equipment Recommendations  3 in 1 bedside commode    Recommendations for Other Services      Precautions / Restrictions Precautions Precautions: Fall Precaution Comments: R PICC Restrictions Weight Bearing Restrictions: No       Mobility Bed Mobility Overal bed mobility: Needs Assistance Bed Mobility: Sit to Supine;Rolling Rolling: Mod assist;Max assist;+2 for safety/equipment (logrolling L and R in bed to use  bed pan end of session)    Sit to supine: Max assist;+2 for physical assistance   General bed mobility comments: vc's for UE placement and overall technique  Transfers                      Balance Overall balance assessment: Needs assistance Sitting-balance support: No upper extremity supported;Feet supported Sitting balance-Leahy Scale: Fair Sitting balance - Comments: steady static sitting                                   ADL either performed or assessed with clinical judgement   ADL Overall ADL's : Needs assistance/impaired     Grooming: Oral care;Sitting;Set up;Wash/dry face;Supervision/safety Grooming Details (indicate cue type and reason): supervision and set up to perform, sitting EOB, increasing fatigue following; required set up for opening toothpaste cap and apply to toothbrush.                                     Vision Baseline Vision/History: Wears glasses Patient Visual Report: No change from baseline     Perception     Praxis      Cognition Arousal/Alertness: Awake/alert Behavior During Therapy: WFL for tasks assessed/performed Overall Cognitive Status: Within Functional Limits for tasks assessed  Exercises General Exercises - Lower Extremity Ankle Circles/Pumps: AROM;Strengthening;Both;10 reps;Seated Long Arc Quad: AROM;Strengthening;Both;10 reps;Seated Other Exercises Other Exercises: Pt seated EOB, set up for seated grooming tasks EOB, pt tolerated well, total of 68min EOB (some time prior to OT's arrival); log roll for bed level toileting, requiring Max A for pericare   Shoulder Instructions       General Comments      Pertinent Vitals/ Pain       Pain Assessment: 0-10 Pain Score: 6  Pain Location: posterior R thigh and lower back, increasing from 4/10-6/10 with increasing time sitting EOB Pain Descriptors / Indicators:  Sore;Discomfort;Aching Pain Intervention(s): Limited activity within patient's tolerance;Monitored during session;Repositioned  Home Living Family/patient expects to be discharged to:: Private residence Living Arrangements: Other relatives (Sister and mother) Available Help at Discharge: Family Type of Home: House Home Access: Stairs to enter [employer] of Steps: 5-6 from back door or 8 steps from front Entrance Stairs-Rails: None Home Layout: One level     Bathroom Shower/Tub: Teacher, early years/pre: Standard     Home Equipment: Clinical cytogeneticist - 2 wheels   Additional Comments: Pt most recently at [facility]      Prior Functioning/Environment    Gait / Transfers Assistance Needed: Required assist for transfers (intermittent use of mechanical lift to get OOB to chair)         Frequency  Min 1X/week        Progress Toward Goals  OT Goals(current goals can now be found in the care plan section)  Progress towards OT goals: Progressing toward goals  Acute Rehab OT Goals Patient Stated Goal: to complete rehab and return home  OT Goal Formulation: With patient Time For Goal Achievement: 04/17/20 Potential to Achieve Goals: Good  Plan Discharge plan remains appropriate;Frequency remains appropriate    Co-evaluation    PT/OT/SLP Co-Evaluation/Treatment: Yes Reason for Co-Treatment: Complexity of the patient's impairments (multi-system involvement);For patient/therapist safety;To address functional/ADL transfers PT goals addressed during session: Mobility/safety with mobility;Balance;Strengthening/ROM OT goals addressed during session: ADL's and self-care;Proper use of Adaptive equipment and DME      AM-PAC OT "6 Clicks" Daily Activity     Outcome Measure   Help from another person eating meals?: A Little Help from another person taking care of personal grooming?: A Little Help from another person toileting, which includes using toliet,  bedpan, or urinal?: Total Help from another person bathing (including washing, rinsing, drying)?: A Lot Help from another person to put on and taking off regular upper body clothing?: A Little Help from another person to put on and taking off regular lower body clothing?: A Lot 6 Click Score: 14    End of Session    OT Visit Diagnosis: Other abnormalities of gait and mobility (R26.89);Muscle weakness (generalized) (M62.81)   Activity Tolerance Patient tolerated treatment well   Patient Left in bed;with call bell/phone within reach;with bed alarm set   Nurse Communication          Time: 6384-5364 OT Time Calculation (min): 25 min  Charges: OT General Charges $OT Visit: 1 Visit OT Treatments $Self Care/Home Management : 8-22 mins  Jeni Salles, MPH, MS, OTR/L ascom [phone] 04/14/20, 3:54 PM

## 2020-04-14 NOTE — Progress Notes (Signed)
[facility]  ROUNDING NOTE   Subjective:   UOP 800 (1700) IV albumin Furosemide 10mg /hr   Objective:  Vital signs in last 24 hours:  Temp:  [97.9 F (36.6 C)-98.9 F (37.2 C)] 97.9 F (36.6 C) (08/30 0832) Pulse Rate:  [72-75] 74 (08/30 1245) Resp:  [19-20] 19 (08/30 1245) BP: (99-134)/(44-69) 134/51 (08/30 1245) SpO2:  [95 %-100 %] 100 % (08/30 0832)  Weight change:  Filed Weights   04/11/20 0400 04/12/20 0346 04/13/20 0520  Weight: 101.4 kg 98.6 kg 102.2 kg    Intake/Output: I/O last 3 completed shifts: In: 646.7 [P.O.:480; I.V.:116.7; IV Piggyback:50] Out: 1800 [Urine:1800]   Intake/Output this shift:  No intake/output data recorded.  Physical Exam: General: NAD,   Head: Normocephalic, atraumatic. Moist oral mucosal membranes  Eyes: Anicteric, PERRL  Neck: Supple, trachea midline  Lungs:  Bilateral crackles  Heart: Regular rate and rhythm  Abdomen:  Soft, nontender,   Extremities:  ++ peripheral edema. +ischemic fingers.   Neurologic: Nonfocal, moving all four extremities  Skin: No lesions        Basic Metabolic Panel: Recent Labs  Lab 04/09/20 0505 04/09/20 0505 04/10/20 2947 04/10/20 6546 04/11/20 5035 04/11/20 0527 04/12/20 0554 04/13/20 0431 04/14/20 0539  NA 138   < > 138  --  139  --  136 136 137  K 3.8   < > 4.1  --  4.0  --  3.6 3.4* 3.5  CL 95*   < > 95*  --  92*  --  93* 92* 95*  CO2 32   < > 31  --  35*  --  34* 32 34*  GLUCOSE 110*   < > 181*  --  204*  --  182* 184* 93  BUN 50*   < > 47*  --  48*  --  44* 39* 37*  CREATININE 1.63*   < > 1.43*  --  1.58*  --  1.39* 1.34* 1.30*  CALCIUM 9.5   < > 9.6   < > 9.9   < > 9.4 9.4 9.3  MG 2.0  --   --   --   --   --   --   --   --   PHOS 2.7  --   --   --   --   --   --   --   --    < > = values in this interval not displayed.    Liver Function Tests: No results for input(s): AST, ALT, ALKPHOS, BILITOT, PROT, ALBUMIN in the last 168 hours. No results for input(s): LIPASE,  AMYLASE in the last 168 hours. No results for input(s): AMMONIA in the last 168 hours.  CBC: Recent Labs  Lab 04/10/20 0618 04/11/20 0527 04/12/20 0554 04/13/20 0431 04/14/20 0539  WBC 9.3 10.7* 11.4* 10.9* 9.7  HGB 8.1* 8.1* 8.1* 7.8* 7.8*  HCT 25.8* 24.7* 25.9* 25.3* 25.1*  MCV 81.4 77.4* 81.2 81.6 81.5  PLT 262 295 279 264 231    Cardiac Enzymes: No results for input(s): CKTOTAL, CKMB, CKMBINDEX, TROPONINI in the last 168 hours.  BNP: Invalid input(s): POCBNP  CBG: Recent Labs  Lab 04/13/20 1208 04/13/20 1654 04/13/20 2040 04/14/20 0834 04/14/20 1236  GLUCAP 315* 249* 221* 63* 146*    Microbiology: Results for orders placed or performed during the hospital encounter of 03/30/20  SARS Coronavirus 2 by RT PCR (hospital order, performed in [facility] hospital lab) Nasopharyngeal Nasopharyngeal Swab  Status: None   Collection Time: 03/30/20  1:51 PM   Specimen: Nasopharyngeal Swab  Result Value Ref Range Status   SARS Coronavirus 2 NEGATIVE NEGATIVE Final    Comment: (NOTE) SARS-CoV-2 target nucleic acids are NOT DETECTED.  The SARS-CoV-2 RNA is generally detectable in upper and lower respiratory specimens during the acute phase of infection. The lowest concentration of SARS-CoV-2 viral copies this assay can detect is 250 copies / mL. A negative result does not preclude SARS-CoV-2 infection and should not be used as the sole basis for treatment or other patient management decisions.  A negative result may occur with improper specimen collection / handling, submission of specimen other than nasopharyngeal swab, presence of viral mutation(s) within the areas targeted by this assay, and inadequate number of viral copies (<250 copies / mL). A negative result must be combined with clinical observations, patient history, and epidemiological information.  Fact Sheet for Patients:   StrictlyIdeas.no  Fact Sheet for Healthcare  Providers: BankingDealers.co.za  This test is not yet approved or  cleared by the [place] FDA and has been authorized for detection and/or diagnosis of SARS-CoV-2 by FDA under an Emergency Use Authorization (EUA).  This EUA will remain in effect (meaning this test can be used) for the duration of the COVID-19 declaration under Section 564(b)(1) of the Act, 21 U.S.C. section 360bbb-3(b)(1), unless the authorization is terminated or revoked sooner.  Performed at [facility], [address] [place], [place] [address]   MRSA PCR Screening     Status: None   Collection Time: 03/30/20 10:23 PM   Specimen: Nasal Mucosa; Nasopharyngeal  Result Value Ref Range Status   MRSA by PCR NEGATIVE NEGATIVE Final    Comment:        The GeneXpert MRSA Assay (FDA approved for NASAL specimens only), is one component of a comprehensive MRSA colonization surveillance program. It is not intended to diagnose MRSA infection nor to guide or monitor treatment for MRSA infections. Performed at [facility], [address] [place], [place] [address]     Coagulation Studies: No results for input(s): LABPROT, INR in the last 72 hours.  Urinalysis: No results for input(s): COLORURINE, LABSPEC, PHURINE, GLUCOSEU, HGBUR, BILIRUBINUR, KETONESUR, PROTEINUR, UROBILINOGEN, NITRITE, LEUKOCYTESUR in the last 72 hours.  Invalid input(s): APPERANCEUR    Imaging: No results found.   Medications:   . sodium chloride 250 mL (04/07/20 2333)  . albumin human 12.5 g (04/14/20 0338)  . furosemide (LASIX) infusion 10 mg/hr (04/13/20 2147)   . amiodarone  200 mg Oral Daily  . apixaban  5 mg Oral BID  . aspirin EC  81 mg Oral Daily  . atorvastatin  20 mg Oral Daily  . Chlorhexidine Gluconate Cloth  6 each Topical Daily  . docusate sodium  100 mg Oral BID  . feeding supplement (NEPRO CARB STEADY)  237 mL Oral BID BM  . ferrous sulfate  325 mg Oral Q breakfast   . gabapentin  100 mg Oral QID  . insulin aspart  0-5 Units Subcutaneous QHS  . insulin aspart  0-9 Units Subcutaneous TID WC  . insulin aspart  6 Units Subcutaneous TID WC  . insulin detemir  17 Units Subcutaneous QHS  . ipratropium-albuterol  3 mL Nebulization TID  . methylPREDNISolone (SOLU-MEDROL) injection  20 mg Intravenous Q24H  . metoprolol succinate  12.5 mg Oral Daily  . multivitamin  1 tablet Oral Daily  . multivitamin with minerals  1 tablet Oral Daily  .  mupirocin cream   Topical BID  . pantoprazole  40 mg Oral Daily  . polyethylene glycol  17 g Oral Daily  . potassium chloride  20 mEq Oral Daily  . sertraline  100 mg Oral Daily  . sodium chloride flush  10-40 mL Intracatheter Q12H  . sodium chloride flush  3 mL Intravenous Q12H  . cyanocobalamin  1,000 mcg Oral Daily  . zinc sulfate  220 mg Oral Daily   sodium chloride, acetaminophen, albuterol, ALPRAZolam, dextromethorphan-guaiFENesin, hydrALAZINE, ondansetron (ZOFRAN) IV, oxyCODONE, sodium chloride flush, sodium chloride flush  Assessment/ Plan:   [name] is a 59 y.o. black female with hypertension, hyperlipidemia, diabetes mellitus type II, COPD, sleep apnea, depression, diastolic congestive heart failure with history of myomectomy, atrial fibrillation who was admitted to [facility] on 03/30/2020 for SOB (shortness of breath) [R06.02] Acute on chronic diastolic CHF (congestive heart failure) ([place]) [I50.33]  1. Acute renal failure on chronic kidney disease stage IIIA with history of proteinuria: baseline creatinine of 1.04, GFR of 58 on 07/02/2019.  Chronic kidney disease secondary to diabetes and hypertension Acute renal failure secondary to acute cardiorenal syndrome.  No recent IV contrast exposure.  Renal ultrasound reviewed.  Not on an ACE-I/ARB at home.   2. Hypertension and acute exacerbation of chronic diastolic congestive heart failure. Echocardiogram 03/31/20.  - furosemide infusion 10mg /hr -  discontinue IV albumin.   3. Anemia with renal failure: hemoglobin 7.8. has not received ESA this admission.  - PO iron supplement  4. Hypokalemia:  - PO potassium chloride.    LOS: [place] 8/30/20212:09 PM

## 2020-04-14 NOTE — Progress Notes (Signed)
PROGRESS NOTE    Julie Bender  HFW:[id number] DOB: 02-05-1961 DOA: 03/30/2020 PCP: Deanne Coffer, MD  Assessment & Plan:   Principal Problem:   Acute on chronic diastolic CHF (congestive heart failure) ([place]) Active Problems:   Depression   Elevated troponin   Acute on chronic respiratory failure with hypoxia (HCC)   COPD (chronic obstructive pulmonary disease) ([place])   Hypertension   Type II diabetes mellitus with renal manifestations ([place])   CKD (chronic kidney disease), stage IIIa   Atrial fibrillation, chronic (HCC)  Acute on chronic diastolic CHF exacerbation: continue on IV lasix drip. Monitor I/Os.  Echo shows EF 55-60%, grade II diastolic dysfunction. Edema still present but improving   Acute on chronic hypoxic respiratory failure: secondary to pulmonary edema in setting of acute on chronic diastolic CHF exacerbation. Continue on supplemental oxygen, currently at 2L [place]   AKI on CKDIIIb: Cr is trending down daily. 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 No intervention at this time as per vascular surg. Continue eliquis. Present on admission. Likely secondary to previously being on pressors as per pt's sister   Leukocytosis: resolved   Elevated troponin: likely secondary to demand ischemia.   QT prolongation: w/ hx of PAF. Continue on metoprolol, amiodarone & eliquis.   Depression: severity unknown. Continue on home dose of zoloft   COPD:continue on steroid taper & bronchodilators. Continue on supplemental oxygen. Encourage incentive spirometry   Hypertension: continue on metoprolol, lasix. IV hydralazine prn   HLD: continue on statin   DM2: HbA1c 6.7 on 03/30/2020. Continue on levemir & SSI w/ accuchecks    ACD:  H&H are stable from day prior. No need for a transfusion at this time. Will continue to monitor   Severe debility: PT/OT recs SNF. Has not walked since Feb 2021    DVT prophylaxis:  eliquis  Code Status: full  Family Communication:  Disposition Plan: likely d/c to SNF    Status is: Inpatient  Remains inpatient appropriate because:IV treatments appropriate due to intensity of illness or inability to take PO; still on IV lasix drip    Dispo:  Patient From: [place]  Planned Disposition: [place], go back to [facility]   Expected discharge date: in >3 days  Medically stable for discharge: No    Consultants:   Vascular surg   nephro    Procedures:    Antimicrobials:    Subjective: Pt c/o shortness of breath   Objective: Vitals:   04/14/20 0541 04/14/20 0755 04/14/20 0832 04/14/20 1245  BP: (!) 99/55  (!) 120/44 (!) 134/51  Pulse: 72  75 74  Resp: 20  19 19   Temp: 98.6 F (37 C)  97.9 F (36.6 C)   TempSrc: Oral  Oral   SpO2: 100% 95% 100%   Weight:      Height:        Intake/Output Summary (Last 24 hours) at 04/14/2020 1258 Last data filed at 04/13/2020 1657 Gross per 24 hour  Intake --  Output 0 ml  Net 0 ml   Filed Weights   04/11/20 0400 04/12/20 0346 04/13/20 0520  Weight: 101.4 kg 98.6 kg 102.2 kg    Examination: General exam:Appears calm but uncomfortable  Respiratory system:diminished breath sounds b/l  Cardiovascular system:S1 &S2 +. No rubs, gallops or clicks.  Gastrointestinal system:Abdomen is obese, soft and nontender. Hypoactive bowel sounds heard. Central nervous system:Alert & awake. Moves all 4 extremities Skin:dry gangrene  on fingers on both hands Psychiatry:Judgement and insight appear abnormal.Flat mood and affect    Data Reviewed: I have personally reviewed following labs and imaging studies  CBC: Recent Labs  Lab 04/10/20 0618 04/11/20 0527 04/12/20 0554 04/13/20 0431 04/14/20 0539  WBC 9.3 10.7* 11.4* 10.9* 9.7  HGB 8.1* 8.1* 8.1* 7.8* 7.8*  HCT 25.8* 24.7* 25.9* 25.3* 25.1*  MCV 81.4 77.4* 81.2 81.6 81.5  PLT 262 295 279 264 161   Basic Metabolic  Panel: Recent Labs  Lab 04/09/20 0505 04/09/20 0505 04/10/20 0618 04/11/20 0527 04/12/20 0554 04/13/20 0431 04/14/20 0539  NA 138   < > 138 139 136 136 137  K 3.8   < > 4.1 4.0 3.6 3.4* 3.5  CL 95*   < > 95* 92* 93* 92* 95*  CO2 32   < > 31 35* 34* 32 34*  GLUCOSE 110*   < > 181* 204* 182* 184* 93  BUN 50*   < > 47* 48* 44* 39* 37*  CREATININE 1.63*   < > 1.43* 1.58* 1.39* 1.34* 1.30*  CALCIUM 9.5   < > 9.6 9.9 9.4 9.4 9.3  MG 2.0  --   --   --   --   --   --   PHOS 2.7  --   --   --   --   --   --    < > = values in this interval not displayed.   GFR: Estimated Creatinine Clearance: 52.9 mL/min (A) (by C-G formula based on SCr of 1.3 mg/dL (H)). Liver Function Tests: No results for input(s): AST, ALT, ALKPHOS, BILITOT, PROT, ALBUMIN in the last 168 hours. No results for input(s): LIPASE, AMYLASE in the last 168 hours. No results for input(s): AMMONIA in the last 168 hours. Coagulation Profile: No results for input(s): INR, PROTIME in the last 168 hours. Cardiac Enzymes: No results for input(s): CKTOTAL, CKMB, CKMBINDEX, TROPONINI in the last 168 hours. BNP (last 3 results) No results for input(s): PROBNP in the last 8760 hours. HbA1C: No results for input(s): HGBA1C in the last 72 hours. CBG: Recent Labs  Lab 04/13/20 1208 04/13/20 1654 04/13/20 2040 04/14/20 0834 04/14/20 1236  GLUCAP 315* 249* 221* 63* 146*   Lipid Profile: No results for input(s): CHOL, HDL, LDLCALC, TRIG, CHOLHDL, LDLDIRECT in the last 72 hours. Thyroid Function Tests: No results for input(s): TSH, T4TOTAL, FREET4, T3FREE, THYROIDAB in the last 72 hours. Anemia Panel: Recent Labs    04/12/20 0554 04/12/20 2020  VITAMINB12  --  897  FOLATE 88.0  --   FERRITIN 46  --   TIBC 263  --   IRON 22*  --   RETICCTPCT 3.1  --    Sepsis Labs: No results for input(s): PROCALCITON, LATICACIDVEN in the last 168 hours.  No results found for this or any previous visit (from the past 240 hour(s)).        Radiology Studies: No results found.      Scheduled Meds: . amiodarone  200 mg Oral Daily  . apixaban  5 mg Oral BID  . aspirin EC  81 mg Oral Daily  . atorvastatin  20 mg Oral Daily  . Chlorhexidine Gluconate Cloth  6 each Topical Daily  . docusate sodium  100 mg Oral BID  . feeding supplement (NEPRO CARB STEADY)  237 mL Oral BID BM  . ferrous sulfate  325 mg Oral Q breakfast  . gabapentin  100 mg Oral QID  .  insulin aspart  0-5 Units Subcutaneous QHS  . insulin aspart  0-9 Units Subcutaneous TID WC  . insulin aspart  6 Units Subcutaneous TID WC  . insulin detemir  17 Units Subcutaneous QHS  . ipratropium-albuterol  3 mL Nebulization TID  . methylPREDNISolone (SOLU-MEDROL) injection  20 mg Intravenous Q24H  . metoprolol succinate  12.5 mg Oral Daily  . multivitamin  1 tablet Oral Daily  . multivitamin with minerals  1 tablet Oral Daily  . mupirocin cream   Topical BID  . pantoprazole  40 mg Oral Daily  . polyethylene glycol  17 g Oral Daily  . potassium chloride  20 mEq Oral Daily  . sertraline  100 mg Oral Daily  . sodium chloride flush  10-40 mL Intracatheter Q12H  . sodium chloride flush  3 mL Intravenous Q12H  . cyanocobalamin  1,000 mcg Oral Daily  . zinc sulfate  220 mg Oral Daily   Continuous Infusions: . sodium chloride 250 mL (04/07/20 2333)  . albumin human 12.5 g (04/14/20 0338)  . furosemide (LASIX) infusion 10 mg/hr (04/13/20 2147)     LOS: 15 days    Time spent: 31 mins    Wyvonnia Dusky, MD [facility] Pager [phone]  If 7PM-7AM, please contact night-coverage [email or web address] 04/14/2020, 12:58 PM

## 2020-04-14 NOTE — Progress Notes (Signed)
Ch visited with Pt after being paged by RN that Pt requested to talk to a Ch. Ch checked in on Pt, and Pt said "I am feeling sorry for myself, I need some encouragement." Ch provided pastoral presence and support. Pt shared with Ch about all the plans she had for this summer, and how she misses being home, around family and being able to walk around. Pt stated that she wished to see her new born grandchild. She said that she is supported well by family, with visitations. Pt states that after her open-heart surgery in February, she woke up two months later to having lost her toes, and almost losing fingers due to an allergic reaction to a medicine. Pt says she is in pain in her fingers all the time. Pt requested prayer. Ch prayed with her. Pt requests daily visits; Ch will pass visit along to [employer]. Pt asked the [facility] to find her cell-phone in her bed, but Ch could not find it.

## 2020-04-14 NOTE — Progress Notes (Signed)
Mobility Specialist - Progress Note   04/14/20 1444  Mobility  Activity Dangled on edge of bed  Level of Assistance Standby assist, set-up cues, supervision of patient - no hands on  Assistive Device Front wheel walker  Distance Ambulated (ft) 0 ft  Mobility Response Tolerated well  Mobility performed by Mobility specialist  $Mobility charge 1 Mobility    Pre-mobility: 75 HR, 123/67 BP, 100% SpO2 During mobility: 81 HR, 100% SpO2 Post-mobility: 75 HR, 129/76 BP, 100% SpO2   Pt was long-sitting in bed with son present in room upon arrival. Pt agreed to session. Pt was utilizing 2L of [place] O2. Pt c/o pain in hands "8/10". Pt seemed much more motivated this date. Pt was minA in getting EOB. Pt dangled EOB with SBA for 3 mins maintaining good balance to support self. Pt performed sit-to-stand x1, maxA +2 with RW. Pt's son assisted in STS transfer. Pt c/o SOB in sitting-supine transfer, O2 sat at 100% throughout session. Overall, pt tolerated session well. Pt was left in bed with alarm set and phone/call bell in reach. Nurse entered room at the end of session and was notified of performance.   Kathee Delton Mobility Specialist 04/14/20, 3:03 PM

## 2020-04-14 NOTE — Progress Notes (Signed)
Inpatient Diabetes Program Recommendations  AACE/ADA: New Consensus Statement on Inpatient Glycemic Control   Target Ranges:  Prepandial:   less than 140 mg/dL      [place] postprandial:   less than 180 mg/dL (1-2 hours)      Critically ill patients:  140 - 180 mg/dL   Results for Julie Bender, Julie Bender (MRN [id number]) as of 04/14/2020 08:53  Ref. Range 04/13/2020 08:15 04/13/2020 12:08 04/13/2020 16:54 04/13/2020 20:40 04/14/2020 08:34  Glucose-Capillary Latest Ref Range: 70 - 99 mg/dL 177 (H) 315 (H) 249 (H) 221 (H) 63 (L)   Review of Glycemic Control  Current orders for Inpatient glycemic control: Levemir 20 units QHS, Novolog 3 units TID with meals, Novolog 0-9 units TID with meals, Novolog 0-5 units QHS; Solumedrol 20 mg Q24H  Inpatient Diabetes Program Recommendations:    Insulin-If steroids are continued as ordered, please consider decreasing Levemir to 17 units QHS and increasing meal coverage to Novolog 6 units TID with meals.  Thanks, Barnie Alderman, RN, MSN, CDE Diabetes Coordinator Inpatient Diabetes Program [phone] (Team Pager from 8am to 5pm)

## 2020-04-15 ENCOUNTER — Ambulatory Visit: Payer: Medicare Other | Admitting: Family

## 2020-04-15 DIAGNOSIS — E1129 Type 2 diabetes mellitus with other diabetic kidney complication: Secondary | ICD-10-CM

## 2020-04-15 LAB — CBC
HCT: 26.9 % — ABNORMAL LOW (ref 36.0–46.0)
Hemoglobin: 8.4 g/dL — ABNORMAL LOW (ref 12.0–15.0)
MCH: 25.4 pg — ABNORMAL LOW (ref 26.0–34.0)
MCHC: 31.2 g/dL (ref 30.0–36.0)
MCV: 81.3 fL (ref 80.0–100.0)
Platelets: 266 10*3/uL (ref 150–400)
RBC: 3.31 MIL/uL — ABNORMAL LOW (ref 3.87–5.11)
RDW: 21.6 % — ABNORMAL HIGH (ref 11.5–15.5)
WBC: 10.9 10*3/uL — ABNORMAL HIGH (ref 4.0–10.5)
nRBC: 0.2 % (ref 0.0–0.2)

## 2020-04-15 LAB — BASIC METABOLIC PANEL
Anion gap: 12 (ref 5–15)
BUN: 37 mg/dL — ABNORMAL HIGH (ref 8–23)
CO2: 32 mmol/L (ref 22–32)
Calcium: 9.5 mg/dL (ref 8.9–10.3)
Chloride: 93 mmol/L — ABNORMAL LOW (ref 98–111)
Creatinine, Ser: 1.22 mg/dL — ABNORMAL HIGH (ref 0.44–1.00)
GFR calc Af Amer: 55 mL/min — ABNORMAL LOW (ref >60)
GFR calc non Af Amer: 48 mL/min — ABNORMAL LOW (ref >60)
Glucose, Bld: 151 mg/dL — ABNORMAL HIGH (ref 70–99)
Potassium: 3.9 mmol/L (ref 3.5–5.1)
Sodium: 137 mmol/L (ref 135–145)

## 2020-04-15 LAB — GLUCOSE, CAPILLARY
Glucose-Capillary: 126 mg/dL — ABNORMAL HIGH (ref 70–99)
Glucose-Capillary: 150 mg/dL — ABNORMAL HIGH (ref 70–99)
Glucose-Capillary: 246 mg/dL — ABNORMAL HIGH (ref 70–99)
Glucose-Capillary: 243 mg/dL — ABNORMAL HIGH (ref 70–99)

## 2020-04-15 MED ORDER — MORPHINE SULFATE (PF) 2 MG/ML IV SOLN
1.0000 mg | INTRAVENOUS | Status: DC | PRN
  Administered 2020-04-15 – 2020-04-17 (×5): 1 mg via INTRAVENOUS
  Filled 2020-04-15 (×5): qty 1

## 2020-04-15 MED ORDER — OXYCODONE HCL 5 MG PO TABS
10.0000 mg | ORAL_TABLET | Freq: Four times a day (QID) | ORAL | Status: DC | PRN
  Administered 2020-04-16 – 2020-04-18 (×5): 10 mg via ORAL
  Filled 2020-04-15 (×5): qty 2

## 2020-04-15 MED ORDER — IPRATROPIUM-ALBUTEROL 0.5-2.5 (3) MG/3ML IN SOLN
3.0000 mL | Freq: Two times a day (BID) | RESPIRATORY_TRACT | Status: DC
  Administered 2020-04-16 – 2020-04-18 (×5): 3 mL via RESPIRATORY_TRACT
  Filled 2020-04-15 (×5): qty 3

## 2020-04-15 NOTE — Progress Notes (Signed)
   Heart Failure Nurse Navigator Note:  Initial visit with patient.  She is currently lying in bed, denies any SOB, PND or orthopnea. States that she sleeps with CPAP at night and does not wake up SOB.  Prior to her hospitalization in February she states she followed very loosely with a low sodium diet due to the bland taste.  Has not been instructed on fluid restriction.  Will continue to follow her through this hospitalization.  Pricilla Riffle RN,CHFN

## 2020-04-15 NOTE — Progress Notes (Signed)
Mobility Specialist - Progress Note   04/15/20 1203  Mobility  Activity Dangled on edge of bed (EOB exercises)  Level of Assistance Minimal assist, patient does 75% or more  Assistive Device None  Distance Ambulated (ft) 0 ft  Mobility Response Tolerated well  Mobility performed by Mobility specialist  $Mobility charge 1 Mobility    Pre-mobility: 74 HR, 115/90 BP, 100% SpO2 During mobility: 75 HR, 99% SpO2 Post-mobility: 74 HR, 131/72 BP, 100% SpO2   Pt was lying in bed upon arrival. Pt agreed to session. Pt c/o fatigue "7/10" and pain in abdomen "8/10". Pt was minA in getting EOB. Pt performed EOB exercises (ankle pumps, straight leg raises). Pt c/o SOB, O2 remained above 99% throughout session. Pt was motivated to attempt ambulating this date, but was educated on the importance in gaining and maintaining balance/posture before attempting. Overall, pt tolerated session well. Pt was left in bed with phone/call bell in reach. Nurse was notified.    Kathee Delton Mobility Specialist 04/15/20, 12:14 PM

## 2020-04-15 NOTE — Plan of Care (Signed)

## 2020-04-15 NOTE — Progress Notes (Signed)
PROGRESS NOTE   HPI was taken from Dr. Blaine Hamper: Julie Bender is a 59 y.o. 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  Patient has short of breath in the past 3 days, which has been progressively worsening.  She also has orthopnea.  She was noted to be cyanotic in SNF.  Oxygen desaturation to 88% on home level 3 L oxygen initially, currently improved to 93% on 3 L oxygen.  Patient had mild confusion earlier, which has resolved.  Currently patient is alert, oriented x3.  She answered all questions appropriately.  Patient denies chest pain, fever or chills.  She has dry cough.  No nausea, vomiting, diarrhea, abdominal pain, symptoms of UTI.  No unilateral numbness or tingling in extremities. No facial droop or slurred speech.  Patient has history of ESRD.  She used to be on dialysis.  Patient states that she her kidney function improved.  She does not need dialysis anymore.  Last dialysis was more than a month ago.  ED Course: pt was found to have BNP 1811, troponin 50, pending COVID-19 PCR, stable renal function, temperature normal, blood pressure 144/99, heart rate 73, RR 25, oxygen sat 93% on 3 L oxygen currently, chest x-ray showed cardiomegaly and vascular congestion.  Patient is admitted to progressive bed as inpatient.   Julie Bender  BZJ:[id number] DOB: 07-Oct-1960 DOA: 03/30/2020 PCP: Deanne Coffer, MD  Assessment & Plan:   Principal Problem:   Acute on chronic diastolic CHF (congestive heart failure) ([place]) Active Problems:   Depression   Elevated troponin   Acute on chronic respiratory failure with hypoxia (HCC)   COPD (chronic obstructive pulmonary disease) ([place])   Hypertension   Type II diabetes mellitus with renal  manifestations ([place])   CKD (chronic kidney disease), stage IIIa   Atrial fibrillation, chronic (HCC)  Acute on chronic diastolic CHF exacerbation: continue on IV lasix drip. Monitor I/Os.  Echo shows EF 55-60%, grade II diastolic dysfunction. B/l swelling still present but improving   Acute on chronic hypoxic respiratory failure: secondary to pulmonary edema in setting of acute on chronic diastolic CHF exacerbation. 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 No intervention at this time as per vascular surg. Continue eliquis. Present on admission. Likely secondary to previously being on pressors as per pt's sister   Leukocytosis: labile. Will continue to monitor   Elevated troponin: likely secondary to demand ischemia.   QT prolongation: w/ hx of PAF. Continue on metoprolol, amiodarone & eliquis.   Depression: severity unknown. Continue on home dose of zoloft   COPD:continue on steroid taper & bronchodilators. Continue on supplemental oxygen. Encourage incentive spirometry   Hypertension: continue on metoprolol, lasix. IV hydralazine prn   HLD: continue on statin   DM2: HbA1c 6.7 on 03/30/2020. Continue on levemir & SSI w/ accuchecks    ACD:  H&H are trending up today. Will transfuse is Hb <7. Will continue to monitor   Severe debility: PT/OT recs SNF. Has not walked since Feb 2021    DVT prophylaxis: eliquis  Code Status: full  Family Communication:  Disposition Plan: likely d/c to SNF    Status is: Inpatient  Remains inpatient appropriate  because:IV treatments appropriate due to intensity of illness or inability to take PO; still on IV lasix drip    Dispo:  Patient From: [facility]  Planned Disposition: [facility], go back to [facility]   Expected discharge date: in >3 days  Medically stable for discharge:  No    Consultants:   Vascular surg   nephro    Procedures:    Antimicrobials:    Subjective: Pt c/o finger pain on both hands.   Objective: Vitals:   04/15/20 0513 04/15/20 0727 04/15/20 0751 04/15/20 1155  BP: 95/62  100/64 131/72  Pulse: 71  76 75  Resp: 20  19 18   Temp: 98.5 F (36.9 C)  98.4 F (36.9 C) 98 F (36.7 C)  TempSrc: Oral  Oral Oral  SpO2: 95% 98% 98% 100%  Weight: 101.6 kg     Height:        Intake/Output Summary (Last 24 hours) at 04/15/2020 1410 Last data filed at 04/15/2020 1349 Gross per 24 hour  Intake 1613.43 ml  Output --  Net 1613.43 ml   Filed Weights   04/12/20 0346 04/13/20 0520 04/15/20 0513  Weight: 98.6 kg 102.2 kg 101.6 kg    Examination: General exam:Appears calm and comfortable Respiratory system:diminished breath sounds b/l  Cardiovascular system:S1 &S2 +. No rubs, gallops or clicks.  Gastrointestinal system:Abdomen is obese, soft and nontender.  Hypoactive bowel sounds heard. Central nervous system: Alert, and awake.  Moves all 4 extremities Skin:dry gangrene on fingers on both hands Psychiatry:Judgement and insight appear normal.Flat mood and affect    Data Reviewed: I have personally reviewed following labs and imaging studies  CBC: Recent Labs  Lab 04/11/20 0527 04/12/20 0554 04/13/20 0431 04/14/20 0539 04/15/20 0938  WBC 10.7* 11.4* 10.9* 9.7 10.9*  HGB 8.1* 8.1* 7.8* 7.8* 8.4*  HCT 24.7* 25.9* 25.3* 25.1* 26.9*  MCV 77.4* 81.2 81.6 81.5 81.3  PLT 295 279 264 231 130   Basic Metabolic Panel: Recent Labs  Lab 04/09/20 0505 04/10/20 0618 04/11/20 0527 04/12/20 0554 04/13/20 0431 04/14/20 0539 04/15/20 0938  NA 138   < > 139 136 136 137 137  K 3.8   < > 4.0 3.6 3.4* 3.5 3.9  CL 95*   < > 92* 93* 92* 95* 93*  CO2 32   < > 35* 34* 32 34* 32  GLUCOSE 110*   < > 204* 182* 184* 93 151*  BUN 50*   < > 48* 44* 39* 37* 37*  CREATININE 1.63*   < > 1.58* 1.39* 1.34* 1.30* 1.22*  CALCIUM 9.5    < > 9.9 9.4 9.4 9.3 9.5  MG 2.0  --   --   --   --   --   --   PHOS 2.7  --   --   --   --   --   --    < > = values in this interval not displayed.   GFR: Estimated Creatinine Clearance: 56.2 mL/min (A) (by C-G formula based on SCr of 1.22 mg/dL (H)). Liver Function Tests: No results for input(s): AST, ALT, ALKPHOS, BILITOT, PROT, ALBUMIN in the last 168 hours. No results for input(s): LIPASE, AMYLASE in the last 168 hours. No results for input(s): AMMONIA in the last 168 hours. Coagulation Profile: No results for input(s): INR, PROTIME in the last 168 hours. Cardiac Enzymes: No results for input(s): CKTOTAL, CKMB, CKMBINDEX, TROPONINI in the last 168 hours. BNP (last 3 results) No results  for input(s): PROBNP in the last 8760 hours. HbA1C: No results for input(s): HGBA1C in the last 72 hours. CBG: Recent Labs  Lab 04/14/20 1236 04/14/20 1731 04/14/20 2129 04/15/20 0803 04/15/20 1145  GLUCAP 146* 163* 209* 126* 150*   Lipid Profile: No results for input(s): CHOL, HDL, LDLCALC, TRIG, CHOLHDL, LDLDIRECT in the last 72 hours. Thyroid Function Tests: No results for input(s): TSH, T4TOTAL, FREET4, T3FREE, THYROIDAB in the last 72 hours. Anemia Panel: Recent Labs    04/12/20 2020  VITAMINB12 897   Sepsis Labs: No results for input(s): PROCALCITON, LATICACIDVEN in the last 168 hours.  No results found for this or any previous visit (from the past 240 hour(s)).       Radiology Studies: No results found.      Scheduled Meds: . amiodarone  200 mg Oral Daily  . apixaban  5 mg Oral BID  . aspirin EC  81 mg Oral Daily  . atorvastatin  20 mg Oral Daily  . Chlorhexidine Gluconate Cloth  6 each Topical Daily  . docusate sodium  100 mg Oral BID  . feeding supplement (NEPRO CARB STEADY)  237 mL Oral BID BM  . ferrous sulfate  325 mg Oral Q breakfast  . gabapentin  100 mg Oral QID  . insulin aspart  0-5 Units Subcutaneous QHS  . insulin aspart  0-9 Units Subcutaneous  TID WC  . insulin aspart  6 Units Subcutaneous TID WC  . insulin detemir  17 Units Subcutaneous QHS  . ipratropium-albuterol  3 mL Nebulization TID  . methylPREDNISolone (SOLU-MEDROL) injection  20 mg Intravenous Q24H  . metoprolol succinate  12.5 mg Oral Daily  . multivitamin  1 tablet Oral Daily  . multivitamin with minerals  1 tablet Oral Daily  . mupirocin cream   Topical BID  . pantoprazole  40 mg Oral Daily  . polyethylene glycol  17 g Oral Daily  . potassium chloride  20 mEq Oral Daily  . sertraline  100 mg Oral Daily  . sodium chloride flush  10-40 mL Intracatheter Q12H  . sodium chloride flush  3 mL Intravenous Q12H  . cyanocobalamin  1,000 mcg Oral Daily  . zinc sulfate  220 mg Oral Daily   Continuous Infusions: . sodium chloride 250 mL (04/07/20 2333)  . furosemide (LASIX) infusion 10 mg/hr (04/15/20 1207)     LOS: 16 days    Time spent: 30 mins    Wyvonnia Dusky, MD [facility] Pager [phone]  If 7PM-7AM, please contact night-coverage [email or web address] 04/15/2020, 2:10 PM

## 2020-04-15 NOTE — Progress Notes (Signed)
[facility]  ROUNDING NOTE   Subjective:   Patient is continuing on Furosemide infusion 10 mg/hr. Peripheral edema improving. No acute distress,resting in bed.  Objective:  Vital signs in last 24 hours:  Temp:  [98 F (36.7 C)-98.5 F (36.9 C)] 98 F (36.7 C) (08/31 1155) Pulse Rate:  [71-76] 75 (08/31 1155) Resp:  [18-20] 18 (08/31 1155) BP: (95-131)/(52-72) 131/72 (08/31 1155) SpO2:  [95 %-100 %] 100 % (08/31 1155) Weight:  [101.6 kg] 101.6 kg (08/31 0513)  Weight change:  Filed Weights   04/12/20 0346 04/13/20 0520 04/15/20 0513  Weight: 98.6 kg 102.2 kg 101.6 kg    Intake/Output: I/O last 3 completed shifts: In: 835.8 [P.O.:360; I.V.:475.8] Out: -    Intake/Output this shift:  Total I/O In: 777.7 [P.O.:720; I.V.:57.7] Out: -   Physical Exam: General: Resting in bed  Head: Normocephalic,atraumatic  Eyes: Anicteric, PERRL  Neck: Supple, trachea midline  Lungs:  Bilateral crackles, diminished at the bases  Heart: S1S2,Regular rate and rhythm  Abdomen:  Soft, nontender, obese  Extremities:  ++ peripheral edema. +ischemic fingers.   Neurologic: Nonfocal, moving all four extremities  Skin: No acute lesions or rashes        Basic Metabolic Panel: Recent Labs  Lab 04/09/20 0505 04/10/20 0618 04/11/20 0527 04/11/20 0527 04/12/20 0554 04/12/20 0554 04/13/20 0431 04/14/20 0539 04/15/20 0938  NA 138   < > 139  --  136  --  136 137 137  K 3.8   < > 4.0  --  3.6  --  3.4* 3.5 3.9  CL 95*   < > 92*  --  93*  --  92* 95* 93*  CO2 32   < > 35*  --  34*  --  32 34* 32  GLUCOSE 110*   < > 204*  --  182*  --  184* 93 151*  BUN 50*   < > 48*  --  44*  --  39* 37* 37*  CREATININE 1.63*   < > 1.58*  --  1.39*  --  1.34* 1.30* 1.22*  CALCIUM 9.5   < > 9.9   < > 9.4   < > 9.4 9.3 9.5  MG 2.0  --   --   --   --   --   --   --   --   PHOS 2.7  --   --   --   --   --   --   --   --    < > = values in this interval not displayed.    Liver Function  Tests: No results for input(s): AST, ALT, ALKPHOS, BILITOT, PROT, ALBUMIN in the last 168 hours. No results for input(s): LIPASE, AMYLASE in the last 168 hours. No results for input(s): AMMONIA in the last 168 hours.  CBC: Recent Labs  Lab 04/11/20 0527 04/12/20 0554 04/13/20 0431 04/14/20 0539 04/15/20 0938  WBC 10.7* 11.4* 10.9* 9.7 10.9*  HGB 8.1* 8.1* 7.8* 7.8* 8.4*  HCT 24.7* 25.9* 25.3* 25.1* 26.9*  MCV 77.4* 81.2 81.6 81.5 81.3  PLT 295 279 264 231 266    Cardiac Enzymes: No results for input(s): CKTOTAL, CKMB, CKMBINDEX, TROPONINI in the last 168 hours.  BNP: Invalid input(s): POCBNP  CBG: Recent Labs  Lab 04/14/20 1236 04/14/20 1731 04/14/20 2129 04/15/20 0803 04/15/20 1145  GLUCAP 146* 163* 209* 126* 150*    Microbiology: Results for orders placed or performed during the hospital encounter  of 03/30/20  SARS Coronavirus 2 by RT PCR (hospital order, performed in [facility] hospital lab) Nasopharyngeal Nasopharyngeal Swab     Status: None   Collection Time: 03/30/20  1:51 PM   Specimen: Nasopharyngeal Swab  Result Value Ref Range Status   SARS Coronavirus 2 NEGATIVE NEGATIVE Final    Comment: (NOTE) SARS-CoV-2 target nucleic acids are NOT DETECTED.  The SARS-CoV-2 RNA is generally detectable in upper and lower respiratory specimens during the acute phase of infection. The lowest concentration of SARS-CoV-2 viral copies this assay can detect is 250 copies / mL. A negative result does not preclude SARS-CoV-2 infection and should not be used as the sole basis for treatment or other patient management decisions.  A negative result may occur with improper specimen collection / handling, submission of specimen other than nasopharyngeal swab, presence of viral mutation(s) within the areas targeted by this assay, and inadequate number of viral copies (<250 copies / mL). A negative result must be combined with clinical observations, patient history, and  epidemiological information.  Fact Sheet for Patients:   StrictlyIdeas.no  Fact Sheet for Healthcare Providers: BankingDealers.co.za  This test is not yet approved or  cleared by the [place] FDA and has been authorized for detection and/or diagnosis of SARS-CoV-2 by FDA under an Emergency Use Authorization (EUA).  This EUA will remain in effect (meaning this test can be used) for the duration of the COVID-19 declaration under Section 564(b)(1) of the Act, 21 U.S.C. section 360bbb-3(b)(1), unless the authorization is terminated or revoked sooner.  Performed at [facility], [address] [place], [place] [address]   MRSA PCR Screening     Status: None   Collection Time: 03/30/20 10:23 PM   Specimen: Nasal Mucosa; Nasopharyngeal  Result Value Ref Range Status   MRSA by PCR NEGATIVE NEGATIVE Final    Comment:        The GeneXpert MRSA Assay (FDA approved for NASAL specimens only), is one component of a comprehensive MRSA colonization surveillance program. It is not intended to diagnose MRSA infection nor to guide or monitor treatment for MRSA infections. Performed at [facility], [address] [place], [place] [address]     Coagulation Studies: No results for input(s): LABPROT, INR in the last 72 hours.  Urinalysis: No results for input(s): COLORURINE, LABSPEC, PHURINE, GLUCOSEU, HGBUR, BILIRUBINUR, KETONESUR, PROTEINUR, UROBILINOGEN, NITRITE, LEUKOCYTESUR in the last 72 hours.  Invalid input(s): APPERANCEUR    Imaging: No results found.   Medications:   . sodium chloride 250 mL (04/07/20 2333)  . furosemide (LASIX) infusion 10 mg/hr (04/15/20 1207)   . amiodarone  200 mg Oral Daily  . apixaban  5 mg Oral BID  . aspirin EC  81 mg Oral Daily  . atorvastatin  20 mg Oral Daily  . Chlorhexidine Gluconate Cloth  6 each Topical Daily  . docusate sodium  100 mg Oral BID  . feeding  supplement (NEPRO CARB STEADY)  237 mL Oral BID BM  . ferrous sulfate  325 mg Oral Q breakfast  . gabapentin  100 mg Oral QID  . insulin aspart  0-5 Units Subcutaneous QHS  . insulin aspart  0-9 Units Subcutaneous TID WC  . insulin aspart  6 Units Subcutaneous TID WC  . insulin detemir  17 Units Subcutaneous QHS  . ipratropium-albuterol  3 mL Nebulization TID  . methylPREDNISolone (SOLU-MEDROL) injection  20 mg Intravenous Q24H  . metoprolol succinate  12.5 mg Oral Daily  . multivitamin  1 tablet Oral Daily  . multivitamin with minerals  1 tablet Oral Daily  . mupirocin cream   Topical BID  . pantoprazole  40 mg Oral Daily  . polyethylene glycol  17 g Oral Daily  . potassium chloride  20 mEq Oral Daily  . sertraline  100 mg Oral Daily  . sodium chloride flush  10-40 mL Intracatheter Q12H  . sodium chloride flush  3 mL Intravenous Q12H  . cyanocobalamin  1,000 mcg Oral Daily  . zinc sulfate  220 mg Oral Daily   sodium chloride, acetaminophen, albuterol, ALPRAZolam, dextromethorphan-guaiFENesin, hydrALAZINE, morphine injection, ondansetron (ZOFRAN) IV, oxyCODONE, sodium chloride flush, sodium chloride flush  Assessment/ Plan:   Ms. Nyanna Heideman is a 59 y.o. black female with hypertension, hyperlipidemia, diabetes mellitus type II, COPD, sleep apnea, depression, diastolic congestive heart failure with history of myomectomy, atrial fibrillation who was admitted to [facility] on 03/30/2020 for SOB (shortness of breath) [R06.02] Acute on chronic diastolic CHF (congestive heart failure) ([place]) [I50.33]  1. Acute renal failure on chronic kidney disease stage IIIA with history of proteinuria: baseline creatinine of 1.04, GFR of 58 on 07/02/2019.  Chronic kidney disease secondary to diabetes and hypertension Acute renal failure secondary to acute cardiorenal syndrome.  Patient is still on furosemide infusion 10 mg/hr, peripheral edema improving slightly Cr 1.22 today  2. Hypertension and acute  exacerbation of chronic diastolic congestive heart failure. Echocardiogram 03/31/20.  - furosemide infusion 10mg /hr BP readings within normal range today  3. Anemia with renal failure: Hgb 8.4 today. has not received ESA this admission.  Will continue  PO iron supplement  4. Hypokalemia:  K+3.9 today   Patient was seen and examined with Crosby Oyster, DNP. Additionally if no clinical improvement, consider adding spironolactone. Above plan was discussed and agreed upon on the signing of this note.   Lavonia Dana , [facility]  8/31/20214:00 PM    LOS: [id number] Heston Widener 8/31/20213:04 PM

## 2020-04-15 NOTE — Progress Notes (Signed)
Nutrition Follow-up  DOCUMENTATION CODES:   Obesity unspecified  INTERVENTION:  Continue Nepro Shake po BID, each supplement provides 425 kcal and 19 grams protein  Continue MVI daily  NUTRITION DIAGNOSIS:   Inadequate oral intake related to acute illness as evidenced by per patient/family report. -ongoing; addressed with ONS  GOAL:   Patient will meet greater than or equal to 90% of their needs -progressing  MONITOR:   PO intake, Supplement acceptance, Labs, Weight trends, Skin, I & O's  REASON FOR ASSESSMENT:   Consult Assessment of nutrition requirement/status  ASSESSMENT:   59 y.o. female with medical history significant of hypertension, hyperlipidemia, diabetes mellitus, COPD on 3 L oxygen, asthma, depression, OSA, ICD placement, dCHF, HOCM s/p 09/2019 [facility] myectomy with a prolonged hospital course and multiple complications, CKD-III, A fib on Eliquis, who presents with shortness breath.  Patient sleeping this morning at RD visit, easily awakened with name call x 2. Patient reports very sleepy today and nodding off during visit. Patient reports varying appetite, endorses drinking Nepro Shakes BID. Patient reports eating a little bit of breakfast, recalls an omelette before falling back asleep. Patient overall is eating well, 60-100% of meals per flowsheets. Will continue Nepro Shake BID to aid with meeting needs  Weights have trended up ~2 lbs since admission, +2 BLE, non-pitting BUE noted  I/Os: -14,997 ml since admit  Medications reviewed and include: Colace, Miralax, Ferrous sulfate 325 mg, Gabapentin, SSI, Levemir, Methylprednisolone, Renavit, MVI, B12, Zinc sulfate  Labs: CBGs 126,209,163,146,63 x 24 hrs, BUN 37 (H), Cr 1.30 (H), Hgb 7.8 (L), HCT 25.1 (L)  Diet Order:   Diet Order            Diet Carb Modified Fluid consistency: Thin; Room service appropriate? Yes; Fluid restriction: 1500 mL Fluid  Diet effective now                 EDUCATION NEEDS:    Education needs have been addressed  Skin:  Skin Assessment: Reviewed RN Assessment (wounds fingers and L foot)  Last BM:  8/30  Height:   Ht Readings from Last 1 Encounters:  03/30/20 5\' 4"  (1.626 m)    Weight:   Wt Readings from Last 1 Encounters:  04/15/20 101.6 kg    Ideal Body Weight:  54.5 kg  BMI:  Body mass index is 38.45 kg/m.  Estimated Nutritional Needs:   Kcal:  2000-2300kcal/day  Protein:  100-115g/day  Fluid:  UOP +1L   Lajuan Lines, RD, LDN Clinical Nutrition After Hours/Weekend Pager # in [place]

## 2020-04-15 NOTE — Progress Notes (Signed)
[place] visited pt. while rounding on 2A; pt. lying down in bed, watching TV; Pt. shared she requested daily chaplain support yesterday in visit w/[facility] Mariea Clonts.  Pt. is struggling to make meaning of her extended suffering --she says she is constantly either uncomfortable or in pain --and the radically different outcome of her heart surgery in Feb. than what she expected.  Pt. expressed the need for 'divine intervention': 'I've done all I can do --I need God to just heal me.'  Pt. says she has made some progress working with OT, and that her breathing has improved; overall, though, pt. is discouraged by extended sickness and hospital stays. Pt.'s sister Julie Bender entered midway through visit; pt. and sister shared that pt.'s heart condition runs in the family: Julie Bender also suffers from it, as well as pt.'s son Julie Bender.  [place] prayed for healing, divine intervention, and sense of divine presence for pt.   [facility] plans to follow up tomorrow if possible.  No further needs at this time.

## 2020-04-16 DIAGNOSIS — I482 Chronic atrial fibrillation, unspecified: Secondary | ICD-10-CM

## 2020-04-16 DIAGNOSIS — N1831 Chronic kidney disease, stage 3a: Secondary | ICD-10-CM

## 2020-04-16 LAB — CBC
HCT: 26.7 % — ABNORMAL LOW (ref 36.0–46.0)
Hemoglobin: 8.3 g/dL — ABNORMAL LOW (ref 12.0–15.0)
MCH: 25.3 pg — ABNORMAL LOW (ref 26.0–34.0)
MCHC: 31.1 g/dL (ref 30.0–36.0)
MCV: 81.4 fL (ref 80.0–100.0)
Platelets: 220 10*3/uL (ref 150–400)
RBC: 3.28 MIL/uL — ABNORMAL LOW (ref 3.87–5.11)
RDW: 21.5 % — ABNORMAL HIGH (ref 11.5–15.5)
WBC: 10.7 10*3/uL — ABNORMAL HIGH (ref 4.0–10.5)
nRBC: 0 % (ref 0.0–0.2)

## 2020-04-16 LAB — BASIC METABOLIC PANEL
Anion gap: 10 (ref 5–15)
BUN: 38 mg/dL — ABNORMAL HIGH (ref 8–23)
CO2: 32 mmol/L (ref 22–32)
Calcium: 9.7 mg/dL (ref 8.9–10.3)
Chloride: 95 mmol/L — ABNORMAL LOW (ref 98–111)
Creatinine, Ser: 1.44 mg/dL — ABNORMAL HIGH (ref 0.44–1.00)
GFR calc Af Amer: 45 mL/min — ABNORMAL LOW (ref >60)
GFR calc non Af Amer: 39 mL/min — ABNORMAL LOW (ref >60)
Glucose, Bld: 108 mg/dL — ABNORMAL HIGH (ref 70–99)
Potassium: 3.9 mmol/L (ref 3.5–5.1)
Sodium: 137 mmol/L (ref 135–145)

## 2020-04-16 LAB — GLUCOSE, CAPILLARY
Glucose-Capillary: 123 mg/dL — ABNORMAL HIGH (ref 70–99)
Glucose-Capillary: 185 mg/dL — ABNORMAL HIGH (ref 70–99)
Glucose-Capillary: 178 mg/dL — ABNORMAL HIGH (ref 70–99)
Glucose-Capillary: 180 mg/dL — ABNORMAL HIGH (ref 70–99)

## 2020-04-16 MED ORDER — SPIRONOLACTONE 25 MG PO TABS
25.0000 mg | ORAL_TABLET | Freq: Every day | ORAL | Status: DC
  Administered 2020-04-16 – 2020-04-18 (×3): 25 mg via ORAL
  Filled 2020-04-16 (×3): qty 1

## 2020-04-16 NOTE — Progress Notes (Signed)
PT Cancellation Note  Patient Details Name: Julie Bender MRN: [id number] DOB: 1961-03-22   Cancelled Treatment:    Reason Eval/Treat Not Completed: Patient declined, no reason specified.  Upon PT arrival to pt's room, pt eating lunch (at 3pm in afternoon).  Pt reporting she sat on edge of bed earlier today with staff assist but felt really SOB.  Pt declining therapy d/t wanting to continue to eat her lunch.  Will re-attempt PT treatment session at a later date/time.  Leitha Bleak, PT 04/16/20, 3:06 PM

## 2020-04-16 NOTE — Progress Notes (Signed)
PROGRESS NOTE   HPI was taken from Dr. Blaine Hamper: Julie Bender is a 59 y.o. 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  Patient has short of breath in the past 3 days, which has been progressively worsening.  She also has orthopnea.  She was noted to be cyanotic in SNF.  Oxygen desaturation to 88% on home level 3 L oxygen initially, currently improved to 93% on 3 L oxygen.  Patient had mild confusion earlier, which has resolved.  Currently patient is alert, oriented x3.  She answered all questions appropriately.  Patient denies chest pain, fever or chills.  She has dry cough.  No nausea, vomiting, diarrhea, abdominal pain, symptoms of UTI.  No unilateral numbness or tingling in extremities. No facial droop or slurred speech.  Patient has history of ESRD.  She used to be on dialysis.  Patient states that she her kidney function improved.  She does not need dialysis anymore.  Last dialysis was more than a month ago.  ED Course: pt was found to have BNP 1811, troponin 50, pending COVID-19 PCR, stable renal function, temperature normal, blood pressure 144/99, heart rate 73, RR 25, oxygen sat 93% on 3 L oxygen currently, chest x-ray showed cardiomegaly and vascular congestion.  Patient is admitted to progressive bed as inpatient.   Julie Bender  IDP:[id number] DOB: 02/20/1961 DOA: 03/30/2020 PCP: Deanne Coffer, MD  Assessment & Plan:   Principal Problem:   Acute on chronic diastolic CHF (congestive heart failure) ([place]) Active Problems:   Depression   Elevated troponin   Acute on chronic respiratory failure with hypoxia (HCC)   COPD (chronic obstructive pulmonary disease) ([place])   Hypertension   Type II diabetes mellitus with renal  manifestations ([place])   CKD (chronic kidney disease), stage IIIa   Atrial fibrillation, chronic (HCC)  Acute on chronic diastolic CHF exacerbation: continue on IV lasix drip. Monitor I/Os.  Echo shows EF 55-60%, grade II diastolic dysfunction. B/l swelling still present but continues to improve.  -11.7 L fluid balance  Acute on chronic hypoxic respiratory failure: secondary to pulmonary edema in setting of acute on chronic diastolic CHF exacerbation. 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 No intervention at this time as per vascular surg. Continue eliquis. Present on admission. Likely secondary to previously being on pressors as per pt's sister   Leukocytosis: labile. Will continue to monitor   Elevated troponin: likely secondary to demand ischemia.   QT prolongation: w/ hx of PAF. Continue on metoprolol, amiodarone & eliquis.   Depression: severity unknown. Continue on home dose of zoloft   COPD:continue on steroid taper & bronchodilators. Continue on supplemental oxygen. Encourage incentive spirometry   Hypertension: continue on metoprolol, lasix. IV hydralazine prn   HLD: continue on statin   DM2: HbA1c 6.7 on 03/30/2020. Continue on levemir & SSI w/ accuchecks    ACD:  H&H are trending up today. Will transfuse is Hb <7. Will continue to monitor   Severe debility: PT/OT recs SNF. Has not walked since Feb 2021    DVT prophylaxis: eliquis  Code Status: full  Family Communication:  Disposition Plan: likely d/c to SNF    Status is: Inpatient  Remains inpatient appropriate because:IV treatments appropriate due to intensity of illness or inability to take PO; still on IV lasix drip    Dispo:  Patient From:    Planned Disposition:  , go back to [facility]   Expected discharge date: in >3 days  Medically stable for discharge:  Ongoing Lasix drip for  now    Consultants:   Vascular surg   nephro    Procedures:    Antimicrobials:    Subjective: Pt c/o finger pain on both hands.  No new issues  Objective: Vitals:   04/15/20 2026 04/16/20 0758 04/16/20 0910 04/16/20 1109  BP:   130/61 121/66  Pulse:   75 75  Resp:   19 20  Temp:   98.2 F (36.8 C) 97.7 F (36.5 C)  TempSrc:   Oral   SpO2: 100% 100% 97% 98%  Weight:      Height:        Intake/Output Summary (Last 24 hours) at 04/16/2020 1527 Last data filed at 04/16/2020 1109 Gross per 24 hour  Intake 1024.12 ml  Output 0 ml  Net 1024.12 ml   Filed Weights   04/12/20 0346 04/13/20 0520 04/15/20 0513  Weight: 98.6 kg 102.2 kg 101.6 kg    Examination: General exam:Appears calm and comfortable Respiratory system:diminished breath sounds b/l  Cardiovascular system:S1 &S2 +. No rubs, gallops or clicks.  Gastrointestinal system:Abdomen is obese, soft and nontender.  Hypoactive bowel sounds heard. Central nervous system: Alert, and awake.  Moves all 4 extremities Skin: Dry gangrene of the thumb and index finger on the right, index finger, third fourth and fifth finger on the left.  Bilateral TMA's, right is well-healed, left with some ulceration on the dorsum which is noninfected Psychiatry:Judgement and insight appear normal.Flat mood and affect    Data Reviewed: I have personally reviewed following labs and imaging studies  CBC: Recent Labs  Lab 04/12/20 0554 04/13/20 0431 04/14/20 0539 04/15/20 0938 04/16/20 0548  WBC 11.4* 10.9* 9.7 10.9* 10.7*  HGB 8.1* 7.8* 7.8* 8.4* 8.3*  HCT 25.9* 25.3* 25.1* 26.9* 26.7*  MCV 81.2 81.6 81.5 81.3 81.4  PLT 279 264 231 266 408   Basic Metabolic Panel: Recent Labs  Lab 04/12/20 0554 04/13/20 0431 04/14/20 0539 04/15/20 0938 04/16/20 0548  NA 136 136 137 137 137  K 3.6 3.4* 3.5 3.9 3.9  CL 93* 92* 95* 93* 95*  CO2 34* 32 34* 32 32  GLUCOSE 182* 184* 93 151* 108*  BUN 44* 39* 37* 37* 38*   CREATININE 1.39* 1.34* 1.30* 1.22* 1.44*  CALCIUM 9.4 9.4 9.3 9.5 9.7   GFR: Estimated Creatinine Clearance: 47.6 mL/min (A) (by C-G formula based on SCr of 1.44 mg/dL (H)). Liver Function Tests: No results for input(s): AST, ALT, ALKPHOS, BILITOT, PROT, ALBUMIN in the last 168 hours. No results for input(s): LIPASE, AMYLASE in the last 168 hours. No results for input(s): AMMONIA in the last 168 hours. Coagulation Profile: No results for input(s): INR, PROTIME in the last 168 hours. Cardiac Enzymes: No results for input(s): CKTOTAL, CKMB, CKMBINDEX, TROPONINI in the last 168 hours. BNP (last 3 results) No results for input(s): PROBNP in the last 8760 hours. HbA1C: No results for input(s): HGBA1C in the last 72 hours. CBG: Recent Labs  Lab 04/15/20 1145 04/15/20 1640 04/15/20 2148 04/16/20 0907 04/16/20 1110  GLUCAP 150* 246* 243* 123* 185*   Lipid Profile: No results for input(s): CHOL, HDL, LDLCALC, TRIG, CHOLHDL, LDLDIRECT in the last 72 hours.  Thyroid Function Tests: No results for input(s): TSH, T4TOTAL, FREET4, T3FREE, THYROIDAB in the last 72 hours. Anemia Panel: No results for input(s): VITAMINB12, FOLATE, FERRITIN, TIBC, IRON, RETICCTPCT in the last 72 hours. Sepsis Labs: No results for input(s): PROCALCITON, LATICACIDVEN in the last 168 hours.  No results found for this or any previous visit (from the past 240 hour(s)).       Radiology Studies: No results found.      Scheduled Meds: . amiodarone  200 mg Oral Daily  . apixaban  5 mg Oral BID  . aspirin EC  81 mg Oral Daily  . atorvastatin  20 mg Oral Daily  . Chlorhexidine Gluconate Cloth  6 each Topical Daily  . docusate sodium  100 mg Oral BID  . feeding supplement (NEPRO CARB STEADY)  237 mL Oral BID BM  . ferrous sulfate  325 mg Oral Q breakfast  . gabapentin  100 mg Oral QID  . insulin aspart  0-5 Units Subcutaneous QHS  . insulin aspart  0-9 Units Subcutaneous TID WC  . insulin aspart  6  Units Subcutaneous TID WC  . insulin detemir  17 Units Subcutaneous QHS  . ipratropium-albuterol  3 mL Nebulization BID  . methylPREDNISolone (SOLU-MEDROL) injection  20 mg Intravenous Q24H  . metoprolol succinate  12.5 mg Oral Daily  . multivitamin  1 tablet Oral Daily  . multivitamin with minerals  1 tablet Oral Daily  . mupirocin cream   Topical BID  . pantoprazole  40 mg Oral Daily  . polyethylene glycol  17 g Oral Daily  . potassium chloride  20 mEq Oral Daily  . sertraline  100 mg Oral Daily  . sodium chloride flush  10-40 mL Intracatheter Q12H  . sodium chloride flush  3 mL Intravenous Q12H  . spironolactone  25 mg Oral Daily  . cyanocobalamin  1,000 mcg Oral Daily  . zinc sulfate  220 mg Oral Daily   Continuous Infusions: . sodium chloride 250 mL (04/07/20 2333)  . furosemide (LASIX) infusion 10 mg/hr (04/16/20 0981)     LOS: 17 days    Time spent: 30 mins    Ashanti Littles Manuella Ghazi, MD [facility] Pager [phone]  If 7PM-7AM, please contact night-coverage [email or web address] 04/16/2020, 3:27 PM

## 2020-04-16 NOTE — Progress Notes (Signed)
Mobility Specialist - Progress Note   04/16/20 1200  Mobility  Activity Dangled on edge of bed  Level of Assistance Moderate assist, patient does 50-74% (max. assist w/ transition from supine-sit and sit-supine)  Mobility Response Tolerated fair  Mobility performed by Mobility specialist  $Mobility charge 1 Mobility    Pre-mobility: 75 HR, 112/52 BP, 98% SpO2 Post-mobility: 75 HR, 123/55 BP, 99% SpO2   Pt laying in bed upon arrival utilizing 1L O2 Julie Bender. Pt agreed to mobility session. Pt needed max. Assist to transition from supine to sit. Pt able to mobilize LE towards EOB, needs assist mainly w/ UE. However, during transition, pt would need breaks d/t feeling SOB. O2 monitored and was maintained > 97% t/o session. Dangling EOB, pt needed mod. Assist for balancing upper body. Pt tends to lean back. Pt encouraged to use bed rail for assist and to use hands to help herself up. Pt able to sit at EOB for ~10 minutes and states she "feels better sitting up". However, pt states she "is having a bad breathing day". Pt requests O2 to be increased up to 2L O2 Julie Bender. Pt mobility session ended soon d/t pt stating she is about to have a BM while dangling EOB and requested to lay back down to use bedpan. Pt need max. Assist to transition from sit to supine. Noticed pt already had finished BM in bed, therefore bed pan was not placed. NT notified. Pt left in bed w/ call bell and phone placed in reach.      Julie Bender Mobility Specialist  04/16/20, 12:10 PM

## 2020-04-16 NOTE — Progress Notes (Signed)
[place] visited pt. today per pt.'s request for daily chaplain support if possible; pt. shared that she felt a bit discouraged by what she feels to be lack of progress working w/PT earlier today; Pt. also shared that she has been able to use less and less O2 over the course of her admission, but she wants to go home and knows that walking and being able to take care of herself are important steps before this can happen.  Pt. continues to experience some pain and itching that keep her from feeling comfortable, though she says her pain is managed sufficiently by medication 'if they're consistent with it'.  [place] prayed for pt.'s continued healing and recovery and for an increased sense of divine presence with her.  [place] will follow up later this week if possible.

## 2020-04-16 NOTE — Progress Notes (Signed)
[facility]  ROUNDING NOTE   Subjective:   Patient states she is feeling like she is improving  Furosemide infusion 10 mg/hr.  Objective:  Vital signs in last 24 hours:  Temp:  [97.7 F (36.5 C)-98.4 F (36.9 C)] 97.7 F (36.5 C) (09/01 1109) Pulse Rate:  [75] 75 (09/01 1109) Resp:  [18-20] 20 (09/01 1109) BP: (118-130)/(61-67) 121/66 (09/01 1109) SpO2:  [97 %-100 %] 98 % (09/01 1109)  Weight change:  Filed Weights   04/12/20 0346 04/13/20 0520 04/15/20 0513  Weight: 98.6 kg 102.2 kg 101.6 kg    Intake/Output: I/O last 3 completed shifts: In: 2397.6 [P.O.:1680; I.V.:717.6] Out: -    Intake/Output this shift:  Total I/O In: 240 [P.O.:240] Out: 0   Physical Exam: General: Resting in bed  Head: Normocephalic,atraumatic  Eyes: Anicteric, PERRL  Neck: Supple, trachea midline  Lungs:  Bilateral crackles, diminished at the bases  Heart: regular  Abdomen:  Soft, nontender, obese  Extremities:  ++ peripheral edema. +ischemic fingers.   Neurologic: Nonfocal, moving all four extremities  Skin: No acute lesions or rashes        Basic Metabolic Panel: Recent Labs  Lab 04/12/20 0554 04/12/20 0554 04/13/20 0431 04/13/20 0431 04/14/20 0539 04/15/20 0938 04/16/20 0548  NA 136  --  136  --  137 137 137  K 3.6  --  3.4*  --  3.5 3.9 3.9  CL 93*  --  92*  --  95* 93* 95*  CO2 34*  --  32  --  34* 32 32  GLUCOSE 182*  --  184*  --  93 151* 108*  BUN 44*  --  39*  --  37* 37* 38*  CREATININE 1.39*  --  1.34*  --  1.30* 1.22* 1.44*  CALCIUM 9.4   < > 9.4   < > 9.3 9.5 9.7   < > = values in this interval not displayed.    Liver Function Tests: No results for input(s): AST, ALT, ALKPHOS, BILITOT, PROT, ALBUMIN in the last 168 hours. No results for input(s): LIPASE, AMYLASE in the last 168 hours. No results for input(s): AMMONIA in the last 168 hours.  CBC: Recent Labs  Lab 04/12/20 0554 04/13/20 0431 04/14/20 0539 04/15/20 0938 04/16/20 0548   WBC 11.4* 10.9* 9.7 10.9* 10.7*  HGB 8.1* 7.8* 7.8* 8.4* 8.3*  HCT 25.9* 25.3* 25.1* 26.9* 26.7*  MCV 81.2 81.6 81.5 81.3 81.4  PLT 279 264 231 266 220    Cardiac Enzymes: No results for input(s): CKTOTAL, CKMB, CKMBINDEX, TROPONINI in the last 168 hours.  BNP: Invalid input(s): POCBNP  CBG: Recent Labs  Lab 04/15/20 1145 04/15/20 1640 04/15/20 2148 04/16/20 0907 04/16/20 1110  GLUCAP 150* 246* 243* 123* 185*    Microbiology: Results for orders placed or performed during the hospital encounter of 03/30/20  SARS Coronavirus 2 by RT PCR (hospital order, performed in [facility] hospital lab) Nasopharyngeal Nasopharyngeal Swab     Status: None   Collection Time: 03/30/20  1:51 PM   Specimen: Nasopharyngeal Swab  Result Value Ref Range Status   SARS Coronavirus 2 NEGATIVE NEGATIVE Final    Comment: (NOTE) SARS-CoV-2 target nucleic acids are NOT DETECTED.  The SARS-CoV-2 RNA is generally detectable in upper and lower respiratory specimens during the acute phase of infection. The lowest concentration of SARS-CoV-2 viral copies this assay can detect is 250 copies / mL. A negative result does not preclude SARS-CoV-2 infection and should not be  used as the sole basis for treatment or other patient management decisions.  A negative result may occur with improper specimen collection / handling, submission of specimen other than nasopharyngeal swab, presence of viral mutation(s) within the areas targeted by this assay, and inadequate number of viral copies (<250 copies / mL). A negative result must be combined with clinical observations, patient history, and epidemiological information.  Fact Sheet for Patients:   StrictlyIdeas.no  Fact Sheet for Healthcare Providers: BankingDealers.co.za  This test is not yet approved or  cleared by the [place] FDA and has been authorized for detection and/or diagnosis of SARS-CoV-2  by FDA under an Emergency Use Authorization (EUA).  This EUA will remain in effect (meaning this test can be used) for the duration of the COVID-19 declaration under Section 564(b)(1) of the Act, 21 U.S.C. section 360bbb-3(b)(1), unless the authorization is terminated or revoked sooner.  Performed at [facility], [address] [place], [place] [address]   MRSA PCR Screening     Status: None   Collection Time: 03/30/20 10:23 PM   Specimen: Nasal Mucosa; Nasopharyngeal  Result Value Ref Range Status   MRSA by PCR NEGATIVE NEGATIVE Final    Comment:        The GeneXpert MRSA Assay (FDA approved for NASAL specimens only), is one component of a comprehensive MRSA colonization surveillance program. It is not intended to diagnose MRSA infection nor to guide or monitor treatment for MRSA infections. Performed at [facility], [address] [place], [place] [address]     Coagulation Studies: No results for input(s): LABPROT, INR in the last 72 hours.  Urinalysis: No results for input(s): COLORURINE, LABSPEC, PHURINE, GLUCOSEU, HGBUR, BILIRUBINUR, KETONESUR, PROTEINUR, UROBILINOGEN, NITRITE, LEUKOCYTESUR in the last 72 hours.  Invalid input(s): APPERANCEUR    Imaging: No results found.   Medications:    sodium chloride 250 mL (04/07/20 2333)   furosemide (LASIX) infusion 10 mg/hr (04/16/20 1607)    amiodarone  200 mg Oral Daily   apixaban  5 mg Oral BID   aspirin EC  81 mg Oral Daily   atorvastatin  20 mg Oral Daily   Chlorhexidine Gluconate Cloth  6 each Topical Daily   docusate sodium  100 mg Oral BID   feeding supplement (NEPRO CARB STEADY)  237 mL Oral BID BM   ferrous sulfate  325 mg Oral Q breakfast   gabapentin  100 mg Oral QID   insulin aspart  0-5 Units Subcutaneous QHS   insulin aspart  0-9 Units Subcutaneous TID WC   insulin aspart  6 Units Subcutaneous TID WC   insulin detemir  17 Units Subcutaneous QHS    ipratropium-albuterol  3 mL Nebulization BID   methylPREDNISolone (SOLU-MEDROL) injection  20 mg Intravenous Q24H   metoprolol succinate  12.5 mg Oral Daily   multivitamin  1 tablet Oral Daily   multivitamin with minerals  1 tablet Oral Daily   mupirocin cream   Topical BID   pantoprazole  40 mg Oral Daily   polyethylene glycol  17 g Oral Daily   potassium chloride  20 mEq Oral Daily   sertraline  100 mg Oral Daily   sodium chloride flush  10-40 mL Intracatheter Q12H   sodium chloride flush  3 mL Intravenous Q12H   cyanocobalamin  1,000 mcg Oral Daily   zinc sulfate  220 mg Oral Daily   sodium chloride, acetaminophen, albuterol, ALPRAZolam, dextromethorphan-guaiFENesin, hydrALAZINE, morphine injection, ondansetron (ZOFRAN) IV, oxyCODONE, sodium chloride flush,  sodium chloride flush  Assessment/ Plan:   [name] is a 59 y.o. black female with hypertension, hyperlipidemia, diabetes mellitus type II, COPD, sleep apnea, depression, diastolic congestive heart failure with history of myomectomy, atrial fibrillation who was admitted to [facility] on 03/30/2020 for SOB (shortness of breath) [R06.02] Acute on chronic diastolic CHF (congestive heart failure) ([place]) [I50.33]  1. Acute renal failure on chronic kidney disease stage IIIA with history of proteinuria: baseline creatinine of 1.04, GFR of 58 on 07/02/2019.  Chronic kidney disease secondary to diabetes and hypertension Acute renal failure secondary to acute cardiorenal syndrome.  Not on an ACE-I/ARB at home.  - Continue furosemide infusion 10 mg/hr   2. Hypertension and acute exacerbation of chronic diastolic congestive heart failure. Echocardiogram 03/31/20.  - furosemide infusion 10mg /hr - metoprolol - start spironolactone  3. Anemia with renal failure: Hgb 8.3. has not received ESA this admission.  - continue PO iron supplement  4. Hypokalemia: improved, at goal 3.9. Secondary to loop diuretics - PO potassium  chloride.  - spironolactone as above    LOS: [employer] 9/1/202112:50 PM

## 2020-04-17 LAB — BASIC METABOLIC PANEL
Anion gap: 10 (ref 5–15)
BUN: 34 mg/dL — ABNORMAL HIGH (ref 8–23)
CO2: 34 mmol/L — ABNORMAL HIGH (ref 22–32)
Calcium: 9.4 mg/dL (ref 8.9–10.3)
Chloride: 95 mmol/L — ABNORMAL LOW (ref 98–111)
Creatinine, Ser: 1.29 mg/dL — ABNORMAL HIGH (ref 0.44–1.00)
GFR calc Af Amer: 52 mL/min — ABNORMAL LOW (ref >60)
GFR calc non Af Amer: 45 mL/min — ABNORMAL LOW (ref >60)
Glucose, Bld: 53 mg/dL — ABNORMAL LOW (ref 70–99)
Potassium: 3.7 mmol/L (ref 3.5–5.1)
Sodium: 139 mmol/L (ref 135–145)

## 2020-04-17 LAB — CBC
HCT: 25.9 % — ABNORMAL LOW (ref 36.0–46.0)
Hemoglobin: 8.4 g/dL — ABNORMAL LOW (ref 12.0–15.0)
MCH: 25.7 pg — ABNORMAL LOW (ref 26.0–34.0)
MCHC: 32.4 g/dL (ref 30.0–36.0)
MCV: 79.2 fL — ABNORMAL LOW (ref 80.0–100.0)
Platelets: 223 10*3/uL (ref 150–400)
RBC: 3.27 MIL/uL — ABNORMAL LOW (ref 3.87–5.11)
RDW: 21.9 % — ABNORMAL HIGH (ref 11.5–15.5)
WBC: 9.4 10*3/uL (ref 4.0–10.5)
nRBC: 0 % (ref 0.0–0.2)

## 2020-04-17 LAB — GLUCOSE, CAPILLARY
Glucose-Capillary: 50 mg/dL — ABNORMAL LOW (ref 70–99)
Glucose-Capillary: 104 mg/dL — ABNORMAL HIGH (ref 70–99)
Glucose-Capillary: 266 mg/dL — ABNORMAL HIGH (ref 70–99)
Glucose-Capillary: 272 mg/dL — ABNORMAL HIGH (ref 70–99)
Glucose-Capillary: 247 mg/dL — ABNORMAL HIGH (ref 70–99)

## 2020-04-17 MED ORDER — INSULIN DETEMIR 100 UNIT/ML ~~LOC~~ SOLN
14.0000 [IU] | Freq: Every day | SUBCUTANEOUS | Status: DC
  Administered 2020-04-17: 14 [IU] via SUBCUTANEOUS
  Filled 2020-04-17 (×2): qty 0.14

## 2020-04-17 MED ORDER — TORSEMIDE 20 MG PO TABS
40.0000 mg | ORAL_TABLET | Freq: Every day | ORAL | Status: DC
  Administered 2020-04-17 – 2020-04-18 (×2): 40 mg via ORAL
  Filled 2020-04-17 (×2): qty 2

## 2020-04-17 NOTE — Progress Notes (Signed)
Heart Failure Nurse Navigator Note:  She is awake and alert lying in bed. Currently on one liter of 02 and using  flutter valve.    Discussed her being discharged tomorrow-she states she does not feel she is ready. She feels she still has some fluid in her upper thighs. States she is able to sleep well with CPAP.  Denies PND or orthopnea.  Her cardiac home medications include lasix and metoprolol succinate.  During her hospitalization she has been started on spironolactone, hydralazine and torsemide.  Will continue to follow her through this hospitalization.  Pricilla Riffle RN, CHFN

## 2020-04-17 NOTE — Progress Notes (Addendum)
Mobility Specialist - Progress Note   04/17/20 1328  Mobility  Activity Refused mobility (Cancellation)  Mobility performed by Mobility specialist     Per chart review, pt is currently medically inappropriate to see for mobility. Pt has a glucose value of 266, which is out of range for mobility guidelines. Will re-attempt session at a later date/time when medically appropriate.   Unita Detamore Mobility Specialist  04/17/20, 1:34 PM

## 2020-04-17 NOTE — Progress Notes (Signed)
DL PICC line removed from Upper right chest Hand pressure applied for 20 minutes. Hemostasis obtained, Catheter intact upon removal. No bleeding or hematoma to site. Site remains dry and intact. vaseline dressing ,2x2, and tegaderm applied to site, patient remains at 30 degrees. No chest discomfort or shortness of breath.

## 2020-04-17 NOTE — Progress Notes (Signed)
PROGRESS NOTE   HPI was taken from Dr. Blaine Hamper: Julie Bender is a 59 y.o. 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  Patient has short of breath in the past 3 days, which has been progressively worsening.  She also has orthopnea.  She was noted to be cyanotic in SNF.  Oxygen desaturation to 88% on home level 3 L oxygen initially, currently improved to 93% on 3 L oxygen.  Patient had mild confusion earlier, which has resolved.  Currently patient is alert, oriented x3.  She answered all questions appropriately.  Patient denies chest pain, fever or chills.  She has dry cough.  No nausea, vomiting, diarrhea, abdominal pain, symptoms of UTI.  No unilateral numbness or tingling in extremities. No facial droop or slurred speech.  Patient has history of ESRD.  She used to be on dialysis.  Patient states that she her kidney function improved.  She does not need dialysis anymore.  Last dialysis was more than a month ago.  ED Course: pt was found to have BNP 1811, troponin 50, pending COVID-19 PCR, stable renal function, temperature normal, blood pressure 144/99, heart rate 73, RR 25, oxygen sat 93% on 3 L oxygen currently, chest x-ray showed cardiomegaly and vascular congestion.  Patient is admitted to progressive bed as inpatient.   Julie Bender  WCB:[id number] DOB: Dec 16, 1960 DOA: 03/30/2020 PCP: Deanne Coffer, MD  Assessment & Plan:   Principal Problem:   Acute on chronic diastolic CHF (congestive heart failure) ([place]) Active Problems:   Depression   Elevated troponin   Acute on chronic respiratory failure with hypoxia (HCC)   COPD (chronic obstructive pulmonary disease) ([place])   Hypertension   Type II diabetes mellitus with renal  manifestations ([place])   CKD (chronic kidney disease), stage IIIa   Atrial fibrillation, chronic (HCC)  Acute on chronic diastolic CHF exacerbation: Discontinue Lasix drip and start torsemide 40 mg oral daily along with Aldactone 25 mg p.o. daily. Monitor I/Os.  Echo shows EF 55-60%, grade II diastolic dysfunction. B/l swelling still present but continues to improve.  -11. 8 L fluid balance  Acute on chronic hypoxic respiratory failure: secondary to pulmonary edema in setting of acute on chronic diastolic CHF exacerbation. Continue on supplemental oxygen, currently at 1 L [place]   AKI on CKDIII a: Cr 1. 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 No intervention at this time as per vascular surg. Continue eliquis. Present on admission. Likely secondary to previously being on pressors as per pt's sister   Leukocytosis: labile. Will continue to monitor   Elevated troponin: likely secondary to demand ischemia.   QT prolongation: w/ hx of PAF. Continue on metoprolol, amiodarone & eliquis.   Depression: severity unknown. Continue on home dose of zoloft   COPD:continue on steroid taper & bronchodilators. Continue on supplemental oxygen. Encourage incentive spirometry   Hypertension: continue on metoprolol, lasix. IV hydralazine prn   HLD: continue on statin   DM2: HbA1c 6.7 on 03/30/2020. Continue on levemir & SSI w/ accuchecks    ACD:  H&H are trending up today. Will transfuse is Hb <7. Will continue to monitor   Severe debility: PT/OT recs SNF. Has not walked since Feb 2021    DVT prophylaxis: eliquis  Code Status: full  Family  Communication:  Disposition Plan: likely d/c to SNF    Status is: Inpatient  Remains inpatient appropriate because:IV treatments appropriate due to intensity of illness or inability to take PO; transition to oral diuretic today   Dispo:  Patient From:    Planned Disposition:  , go back to [facility]   Expected discharge date: in  1 days  Medically stable for discharge:  Likely tomorrow    Consultants:   Vascular surg   nephro    Procedures:    Antimicrobials:    Subjective: Seem to be improving.  No new complaints.  Agreeable for rehab tomorrow  Objective: Vitals:   04/17/20 0825 04/17/20 0902 04/17/20 1158 04/17/20 1625  BP: 130/67  120/64 118/60  Pulse: 75  74 74  Resp: 19  18 19   Temp: 97.8 F (36.6 C)  97.8 F (36.6 C) (!) 97.5 F (36.4 C)  TempSrc:    Oral  SpO2: 100% 95% 97% 99%  Weight:      Height:        Intake/Output Summary (Last 24 hours) at 04/17/2020 1953 Last data filed at 04/17/2020 1830 Gross per 24 hour  Intake 1241.04 ml  Output 600 ml  Net 641.04 ml   Filed Weights   04/13/20 0520 04/15/20 0513 04/17/20 0541  Weight: 102.2 kg 101.6 kg 98.6 kg    Examination: General exam:Appears calm and comfortable Respiratory system:diminished breath sounds b/l  Cardiovascular system:S1 &S2 +. No rubs, gallops or clicks.  Gastrointestinal system:Abdomen is obese, soft and nontender.  Hypoactive bowel sounds heard. Central nervous system: Alert, and awake.  Moves all 4 extremities Skin: Dry gangrene of the thumb and index finger on the right, index finger, third fourth and fifth finger on the left.  Bilateral TMA's, right is well-healed, left with some ulceration on the dorsum which is noninfected Psychiatry:Judgement and insight appear normal.Flat mood and affect    Data Reviewed: I have personally reviewed following labs and imaging studies  CBC: Recent Labs  Lab 04/13/20 0431 04/14/20 0539 04/15/20 0938 04/16/20 0548 04/17/20 0638  WBC 10.9* 9.7 10.9* 10.7* 9.4  HGB 7.8* 7.8* 8.4* 8.3* 8.4*  HCT 25.3* 25.1* 26.9* 26.7* 25.9*  MCV 81.6 81.5 81.3 81.4 79.2*  PLT 264 231 266 220 993   Basic Metabolic Panel: Recent Labs  Lab 04/13/20 0431 04/14/20 0539 04/15/20 0938 04/16/20 0548 04/17/20 0638  NA 136 137 137 137 139  K 3.4* 3.5 3.9 3.9 3.7  CL 92*  95* 93* 95* 95*  CO2 32 34* 32 32 34*  GLUCOSE 184* 93 151* 108* 53*  BUN 39* 37* 37* 38* 34*  CREATININE 1.34* 1.30* 1.22* 1.44* 1.29*  CALCIUM 9.4 9.3 9.5 9.7 9.4   GFR: Estimated Creatinine Clearance: 52.3 mL/min (A) (by C-G formula based on SCr of 1.29 mg/dL (H)). Liver Function Tests: No results for input(s): AST, ALT, ALKPHOS, BILITOT, PROT, ALBUMIN in the last 168 hours. No results for input(s): LIPASE, AMYLASE in the last 168 hours. No results for input(s): AMMONIA in the last 168 hours. Coagulation Profile: No results for input(s): INR, PROTIME in the last 168 hours. Cardiac Enzymes: No results for input(s): CKTOTAL, CKMB, CKMBINDEX, TROPONINI in the last 168 hours. BNP (last 3 results) No results for input(s): PROBNP in the last 8760 hours. HbA1C: No results for input(s): HGBA1C in the last 72 hours. CBG: Recent Labs  Lab 04/16/20 2058 04/17/20 0824 04/17/20 0916 04/17/20 1158 04/17/20 1623  GLUCAP 180* 50* 104* 266* 272*  Lipid Profile: No results for input(s): CHOL, HDL, LDLCALC, TRIG, CHOLHDL, LDLDIRECT in the last 72 hours. Thyroid Function Tests: No results for input(s): TSH, T4TOTAL, FREET4, T3FREE, THYROIDAB in the last 72 hours. Anemia Panel: No results for input(s): VITAMINB12, FOLATE, FERRITIN, TIBC, IRON, RETICCTPCT in the last 72 hours. Sepsis Labs: No results for input(s): PROCALCITON, LATICACIDVEN in the last 168 hours.  No results found for this or any previous visit (from the past 240 hour(s)).       Radiology Studies: No results found.      Scheduled Meds: . amiodarone  200 mg Oral Daily  . apixaban  5 mg Oral BID  . aspirin EC  81 mg Oral Daily  . atorvastatin  20 mg Oral Daily  . Chlorhexidine Gluconate Cloth  6 each Topical Daily  . docusate sodium  100 mg Oral BID  . feeding supplement (NEPRO CARB STEADY)  237 mL Oral BID BM  . ferrous sulfate  325 mg Oral Q breakfast  . gabapentin  100 mg Oral QID  . insulin aspart  0-5  Units Subcutaneous QHS  . insulin aspart  0-9 Units Subcutaneous TID WC  . insulin aspart  6 Units Subcutaneous TID WC  . insulin detemir  14 Units Subcutaneous QHS  . ipratropium-albuterol  3 mL Nebulization BID  . methylPREDNISolone (SOLU-MEDROL) injection  20 mg Intravenous Q24H  . metoprolol succinate  12.5 mg Oral Daily  . multivitamin  1 tablet Oral Daily  . multivitamin with minerals  1 tablet Oral Daily  . mupirocin cream   Topical BID  . pantoprazole  40 mg Oral Daily  . polyethylene glycol  17 g Oral Daily  . potassium chloride  20 mEq Oral Daily  . sertraline  100 mg Oral Daily  . sodium chloride flush  10-40 mL Intracatheter Q12H  . sodium chloride flush  3 mL Intravenous Q12H  . spironolactone  25 mg Oral Daily  . torsemide  40 mg Oral Daily  . cyanocobalamin  1,000 mcg Oral Daily  . zinc sulfate  220 mg Oral Daily   Continuous Infusions: . sodium chloride 250 mL (04/07/20 2333)     LOS: 18 days    Time spent: 30 mins    Marvie Brevik Manuella Ghazi, MD [facility] Pager [phone]  If 7PM-7AM, please contact night-coverage [email or web address] 04/17/2020, 7:53 PM

## 2020-04-17 NOTE — Progress Notes (Signed)
PT Cancellation Note  Patient Details Name: Julie Bender MRN: [id number] DOB: 1961-05-01   Cancelled Treatment:    Reason Eval/Treat Not Completed: Medical issues which prohibited therapy.  Nurse reports pt with low blood sugar this morning (last glucose noted to be 50 at 0824).  D/t low glucose, will hold PT at this time and re-attempt PT treatment session at a later date/time.  Leitha Bleak, PT 04/17/20, 9:18 AM

## 2020-04-17 NOTE — Progress Notes (Signed)
OT Cancellation Note  Patient Details Name: Julie Bender MRN: [id number] DOB: 08-May-1961   Cancelled Treatment:    Reason Eval/Treat Not Completed: Medical issues which prohibited therapy  RN just finished pulling PICC line and indicates pt needs to lie flat for an hour. In addition, requests that no UB exercise completed at this time d/t location. Will hold OT at this time and f/u when pt more appropriate. Thank you.   Gerrianne Scale, [place], OTR/L ascom [phone] 04/17/20, 4:26 PM

## 2020-04-17 NOTE — Progress Notes (Signed)
[facility]  ROUNDING NOTE   Subjective:   Patient states she is feeling like she is improving  Furosemide infusion 10 mg/hr.   UOP 500 recorded  Creatinine 1.29 (1.44)  Objective:  Vital signs in last 24 hours:  Temp:  [97.7 F (36.5 C)-98.5 F (36.9 C)] 97.8 F (36.6 C) (09/02 0825) Pulse Rate:  [74-75] 75 (09/02 0825) Resp:  [19-20] 19 (09/02 0825) BP: (118-130)/(46-67) 130/67 (09/02 0825) SpO2:  [95 %-100 %] 95 % (09/02 0902) Weight:  [98.6 kg] 98.6 kg (09/02 0541)  Weight change:  Filed Weights   04/13/20 0520 04/15/20 0513 04/17/20 0541  Weight: 102.2 kg 101.6 kg 98.6 kg    Intake/Output: I/O last 3 completed shifts: In: 745.5 [P.O.:360; I.V.:385.5] Out: 500 [Urine:500]   Intake/Output this shift:  Total I/O In: 240 [P.O.:240] Out: -   Physical Exam: General: Resting in bed  Head: Normocephalic,atraumatic  Eyes: Anicteric, PERRL  Neck: Supple, trachea midline  Lungs:  Bilateral crackles, diminished at the bases  Heart: regular  Abdomen:  Soft, nontender, obese  Extremities:  + peripheral edema. +ischemic fingers.   Neurologic: Nonfocal, moving all four extremities  Skin: No acute lesions or rashes        Basic Metabolic Panel: Recent Labs  Lab 04/13/20 0431 04/13/20 0431 04/14/20 0539 04/14/20 0539 04/15/20 0938 04/16/20 0548 04/17/20 0638  NA 136  --  137  --  137 137 139  K 3.4*  --  3.5  --  3.9 3.9 3.7  CL 92*  --  95*  --  93* 95* 95*  CO2 32  --  34*  --  32 32 34*  GLUCOSE 184*  --  93  --  151* 108* 53*  BUN 39*  --  37*  --  37* 38* 34*  CREATININE 1.34*  --  1.30*  --  1.22* 1.44* 1.29*  CALCIUM 9.4   < > 9.3   < > 9.5 9.7 9.4   < > = values in this interval not displayed.    Liver Function Tests: No results for input(s): AST, ALT, ALKPHOS, BILITOT, PROT, ALBUMIN in the last 168 hours. No results for input(s): LIPASE, AMYLASE in the last 168 hours. No results for input(s): AMMONIA in the last 168  hours.  CBC: Recent Labs  Lab 04/13/20 0431 04/14/20 0539 04/15/20 0938 04/16/20 0548 04/17/20 0638  WBC 10.9* 9.7 10.9* 10.7* 9.4  HGB 7.8* 7.8* 8.4* 8.3* 8.4*  HCT 25.3* 25.1* 26.9* 26.7* 25.9*  MCV 81.6 81.5 81.3 81.4 79.2*  PLT 264 231 266 220 223    Cardiac Enzymes: No results for input(s): CKTOTAL, CKMB, CKMBINDEX, TROPONINI in the last 168 hours.  BNP: Invalid input(s): POCBNP  CBG: Recent Labs  Lab 04/16/20 1110 04/16/20 1701 04/16/20 2058 04/17/20 0824 04/17/20 0916  GLUCAP 185* 178* 180* 50* 104*    Microbiology: Results for orders placed or performed during the hospital encounter of 03/30/20  SARS Coronavirus 2 by RT PCR (hospital order, performed in [facility] hospital lab) Nasopharyngeal Nasopharyngeal Swab     Status: None   Collection Time: 03/30/20  1:51 PM   Specimen: Nasopharyngeal Swab  Result Value Ref Range Status   SARS Coronavirus 2 NEGATIVE NEGATIVE Final    Comment: (NOTE) SARS-CoV-2 target nucleic acids are NOT DETECTED.  The SARS-CoV-2 RNA is generally detectable in upper and lower respiratory specimens during the acute phase of infection. The lowest concentration of SARS-CoV-2 viral copies this assay can detect  is 250 copies / mL. A negative result does not preclude SARS-CoV-2 infection and should not be used as the sole basis for treatment or other patient management decisions.  A negative result may occur with improper specimen collection / handling, submission of specimen other than nasopharyngeal swab, presence of viral mutation(s) within the areas targeted by this assay, and inadequate number of viral copies (<250 copies / mL). A negative result must be combined with clinical observations, patient history, and epidemiological information.  Fact Sheet for Patients:   StrictlyIdeas.no  Fact Sheet for Healthcare Providers: BankingDealers.co.za  This test is not yet approved or   cleared by the [place] FDA and has been authorized for detection and/or diagnosis of SARS-CoV-2 by FDA under an Emergency Use Authorization (EUA).  This EUA will remain in effect (meaning this test can be used) for the duration of the COVID-19 declaration under Section 564(b)(1) of the Act, 21 U.S.C. section 360bbb-3(b)(1), unless the authorization is terminated or revoked sooner.  Performed at [facility], [address] [place], [place] [address]   MRSA PCR Screening     Status: None   Collection Time: 03/30/20 10:23 PM   Specimen: Nasal Mucosa; Nasopharyngeal  Result Value Ref Range Status   MRSA by PCR NEGATIVE NEGATIVE Final    Comment:        The GeneXpert MRSA Assay (FDA approved for NASAL specimens only), is one component of a comprehensive MRSA colonization surveillance program. It is not intended to diagnose MRSA infection nor to guide or monitor treatment for MRSA infections. Performed at [facility], [address] [place], [place] [address]     Coagulation Studies: No results for input(s): LABPROT, INR in the last 72 hours.  Urinalysis: No results for input(s): COLORURINE, LABSPEC, PHURINE, GLUCOSEU, HGBUR, BILIRUBINUR, KETONESUR, PROTEINUR, UROBILINOGEN, NITRITE, LEUKOCYTESUR in the last 72 hours.  Invalid input(s): APPERANCEUR    Imaging: No results found.   Medications:   . sodium chloride 250 mL (04/07/20 2333)  . furosemide (LASIX) infusion 10 mg/hr (04/17/20 0400)   . amiodarone  200 mg Oral Daily  . apixaban  5 mg Oral BID  . aspirin EC  81 mg Oral Daily  . atorvastatin  20 mg Oral Daily  . Chlorhexidine Gluconate Cloth  6 each Topical Daily  . docusate sodium  100 mg Oral BID  . feeding supplement (NEPRO CARB STEADY)  237 mL Oral BID BM  . ferrous sulfate  325 mg Oral Q breakfast  . gabapentin  100 mg Oral QID  . insulin aspart  0-5 Units Subcutaneous QHS  . insulin aspart  0-9 Units Subcutaneous TID WC  .  insulin aspart  6 Units Subcutaneous TID WC  . insulin detemir  17 Units Subcutaneous QHS  . ipratropium-albuterol  3 mL Nebulization BID  . methylPREDNISolone (SOLU-MEDROL) injection  20 mg Intravenous Q24H  . metoprolol succinate  12.5 mg Oral Daily  . multivitamin  1 tablet Oral Daily  . multivitamin with minerals  1 tablet Oral Daily  . mupirocin cream   Topical BID  . pantoprazole  40 mg Oral Daily  . polyethylene glycol  17 g Oral Daily  . potassium chloride  20 mEq Oral Daily  . sertraline  100 mg Oral Daily  . sodium chloride flush  10-40 mL Intracatheter Q12H  . sodium chloride flush  3 mL Intravenous Q12H  . spironolactone  25 mg Oral Daily  . cyanocobalamin  1,000 mcg Oral Daily  .  zinc sulfate  220 mg Oral Daily   sodium chloride, acetaminophen, albuterol, ALPRAZolam, dextromethorphan-guaiFENesin, hydrALAZINE, morphine injection, ondansetron (ZOFRAN) IV, oxyCODONE, sodium chloride flush, sodium chloride flush  Assessment/ Plan:   [name] is a 59 y.o. black female with hypertension, hyperlipidemia, diabetes mellitus type II, COPD, sleep apnea, depression, diastolic congestive heart failure with history of myomectomy, atrial fibrillation who was admitted to [facility] on 03/30/2020 for SOB (shortness of breath) [R06.02] Acute on chronic diastolic CHF (congestive heart failure) ([place]) [I50.33]  1. Acute renal failure on chronic kidney disease stage IIIA with history of proteinuria: baseline creatinine of 1.04, GFR of 58 on 07/02/2019.  Chronic kidney disease secondary to diabetes and hypertension Acute renal failure secondary to acute cardiorenal syndrome.  Not on an ACE-I/ARB at home.  - Transition to PO torsemide - continue spironolactone  2. Hypertension and acute exacerbation of chronic diastolic congestive heart failure. Echocardiogram 03/31/20.  - furosemide infusion 10mg /hr - metoprolol - started spironolactone this admission  3. Anemia with renal failure: Hgb  8.4. has not received ESA this admission.  - continue PO iron supplement  4. Hypokalemia: improved, at goal 3.7. Secondary to loop diuretics - PO potassium chloride.  - spironolactone     LOS: 18 Jaedin Trumbo 9/2/202110:39 AM

## 2020-04-17 NOTE — Progress Notes (Signed)
Inpatient Diabetes Program Recommendations  AACE/ADA: New Consensus Statement on Inpatient Glycemic Control   Target Ranges:  Prepandial:   less than 140 mg/dL      [facility] postprandial:   less than 180 mg/dL (1-2 hours)      Critically ill patients:  140 - 180 mg/dL  Results for Julie Bender, Julie Bender (MRN [id number]) as of 04/17/2020 09:29  Ref. Range 04/16/2020 09:07 04/16/2020 11:10 04/16/2020 17:01 04/16/2020 20:58 04/17/2020 08:24 04/17/2020 09:16  Glucose-Capillary Latest Ref Range: 70 - 99 mg/dL 123 (H) 185 (H) 178 (H) 180 (H) 50 (L) 104 (H)   Review of Glycemic Control  Current orders for Inpatient glycemic control: Levemir 17 units QHS, Novolog 6 units TID with meals, Novolog 0-9 units TID with meals, Novolog 0-5 units QHS; Solumedrol 20 mg Q24H  Inpatient Diabetes Program Recommendations:    Insulin-If steroids are continued as ordered, please consider decreasing Levemir to 14 units QHS.  Thanks, Barnie Alderman, RN, MSN, CDE Diabetes Coordinator Inpatient Diabetes Program [phone] (Team Pager from 8am to 5pm)

## 2020-04-17 NOTE — Care Management Important Message (Signed)
Important Message  Patient Details  Name: Julie Bender MRN: [id number] Date of Birth: 10/29/1960   Medicare Important Message Given:  Yes     Dannette Barbara 04/17/2020, 1:56 PM

## 2020-04-18 DIAGNOSIS — R0602 Shortness of breath: Secondary | ICD-10-CM

## 2020-04-18 LAB — CBC
HCT: 26.3 % — ABNORMAL LOW (ref 36.0–46.0)
Hemoglobin: 8.2 g/dL — ABNORMAL LOW (ref 12.0–15.0)
MCH: 25.3 pg — ABNORMAL LOW (ref 26.0–34.0)
MCHC: 31.2 g/dL (ref 30.0–36.0)
MCV: 81.2 fL (ref 80.0–100.0)
Platelets: 224 10*3/uL (ref 150–400)
RBC: 3.24 MIL/uL — ABNORMAL LOW (ref 3.87–5.11)
RDW: 21.2 % — ABNORMAL HIGH (ref 11.5–15.5)
WBC: 12.2 10*3/uL — ABNORMAL HIGH (ref 4.0–10.5)
nRBC: 0.2 % (ref 0.0–0.2)

## 2020-04-18 LAB — GLUCOSE, CAPILLARY
Glucose-Capillary: 40 mg/dL — CL (ref 70–99)
Glucose-Capillary: 87 mg/dL (ref 70–99)
Glucose-Capillary: 112 mg/dL — ABNORMAL HIGH (ref 70–99)

## 2020-04-18 LAB — BASIC METABOLIC PANEL
Anion gap: 10 (ref 5–15)
BUN: 37 mg/dL — ABNORMAL HIGH (ref 8–23)
CO2: 31 mmol/L (ref 22–32)
Calcium: 9.7 mg/dL (ref 8.9–10.3)
Chloride: 97 mmol/L — ABNORMAL LOW (ref 98–111)
Creatinine, Ser: 1.33 mg/dL — ABNORMAL HIGH (ref 0.44–1.00)
GFR calc Af Amer: 50 mL/min — ABNORMAL LOW (ref >60)
GFR calc non Af Amer: 43 mL/min — ABNORMAL LOW (ref >60)
Glucose, Bld: 78 mg/dL (ref 70–99)
Potassium: 3.9 mmol/L (ref 3.5–5.1)
Sodium: 138 mmol/L (ref 135–145)

## 2020-04-18 LAB — RESPIRATORY PANEL BY RT PCR (FLU A&B, COVID)
Influenza A by PCR: NEGATIVE
Influenza B by PCR: NEGATIVE
SARS Coronavirus 2 by RT PCR: NEGATIVE

## 2020-04-18 MED ORDER — INSULIN DETEMIR 100 UNIT/ML ~~LOC~~ SOLN: 5.0000 [IU] | Freq: Every day | SUBCUTANEOUS | 11 refills | Status: AC

## 2020-04-18 MED ORDER — TORSEMIDE 20 MG PO TABS: 40.0000 mg | ORAL_TABLET | Freq: Every day | ORAL | 0 refills | Status: AC

## 2020-04-18 MED ORDER — SPIRONOLACTONE 25 MG PO TABS: 25.0000 mg | ORAL_TABLET | Freq: Every day | ORAL | 0 refills | Status: AC

## 2020-04-18 MED ORDER — POTASSIUM CHLORIDE CRYS ER 20 MEQ PO TBCR: 20.0000 meq | EXTENDED_RELEASE_TABLET | Freq: Every day | ORAL | 0 refills | Status: AC

## 2020-04-18 MED ORDER — OXYCODONE HCL 5 MG PO TABS: 5.0000 mg | ORAL_TABLET | Freq: Four times a day (QID) | ORAL | 0 refills | Status: AC | PRN

## 2020-04-18 MED ORDER — DM-GUAIFENESIN ER 30-600 MG PO TB12: 1.0000 | ORAL_TABLET | Freq: Two times a day (BID) | ORAL | 0 refills | Status: AC | PRN

## 2020-04-18 NOTE — TOC Transition Note (Signed)
Transition of Care [facility]) - CM/SW Discharge Note   Patient Details  Name: Linley Moskal MRN: [id number] Date of Birth: 23-Jun-1961  [facility]) CM/SW Contact:  Victorino Dike, RN Phone Number: 04/18/2020, 10:24 AM   Clinical Narrative:     Patient was originally transferring to [facility] today,, but they informed me they do not have any bed availibility.  [facility] accepted patient and patient is in agreement with this bed offer.  [facility] transport is scheduled for a 2pm transport per facility request.  Nurse notified and phone number provided for report.     Final next level of care: Skilled Nursing Facility Barriers to Discharge: Barriers Resolved   Patient Goals and CMS Choice Patient states their goals for this hospitalization and ongoing recovery are:: to get stronger   Choice offered to / list presented to : Patient  Discharge Placement              Patient chooses bed at: [facility] Patient to be transferred to facility by: [facility] Name of family member notified: patient Patient and family notified of of transfer: 04/18/20  Discharge Plan and Services In-house Referral: Clinical Social Work   Post Acute Care Choice: [place]                               Social Determinants of Health ([place]) Interventions     Readmission Risk Interventions Readmission Risk Prevention Plan 06/02/2019 04/20/2018  Transportation Screening Complete Complete  PCP or Specialist Appt within 5-7 Days - Complete  Home Care Screening - Complete  Medication Review (RN CM) - Complete  Palliative Care Screening Not Applicable -  Medication Review (RN Care Manager) Complete -  Some recent data might be hidden

## 2020-04-18 NOTE — Progress Notes (Signed)
[facility] visited pt. as follow-up from several prior visits and per pt.'s request for chaplain support; pt. scheduled to be discharged to rehab in [place] today, per 2A staff.  Pt. is glad to be going to rehab; Ch discussed her progress thus far, emphasizing the gains she has achieved in contrast to the limitations she is conscious of re: lack of mobility and independence.  Pt shared that she is planning to eventually record a YouTube video sharing the story of how God has given her strength to get through the difficulties of the last six months.  Pt. requested prayer for her continued healing and for God's strength for her sisters and mother as they deal with their own health challenges.  CH remains available as needed.

## 2020-04-18 NOTE — Discharge Instructions (Signed)
Chronic Kidney Disease, Adult Chronic kidney disease (CKD) occurs when the kidneys become damaged slowly over a long period of time. The kidneys are a pair of organs that do many important jobs in the body, including:  Removing waste and extra fluid from the blood to make urine.  Making hormones that maintain the amount of fluid in tissues and blood vessels.  Maintaining the right amount of fluids and chemicals in the body. A small amount of kidney damage may not cause problems, but a large amount of damage may make it hard or impossible for the kidneys to work the way they should. If steps are not taken to slow down kidney damage or to stop it from getting worse, the kidneys may stop working permanently (end-stage renal disease or ESRD). Most of the time, CKD does not go away, but it can often be controlled. People who have CKD are usually able to live normal lives. What are the causes? The most common causes of this condition are diabetes and high blood pressure (hypertension). Other causes include:  Heart and blood vessel (cardiovascular) disease.  Kidney diseases, such as: ? Glomerulonephritis. ? Interstitial nephritis. ? Polycystic kidney disease. ? Renal vascular disease.  Diseases that affect the immune system.  Genetic diseases.  Medicines that damage the kidneys, such as anti-inflammatory medicines.  Being around or being in contact with poisonous (toxic) substances.  A kidney or urinary infection that occurs again and again (recurs).  Vasculitis. This is swelling or inflammation of the blood vessels.  A problem with urine flow that may be caused by: ? Cancer. ? Having kidney stones more than one time. ? An enlarged prostate, in males. What increases the risk? You are more likely to develop this condition if you:  Are older than age 60.  Are female.  Are African-American, Hispanic, Asian, Pacific Islander, or American Indian.  Are a current or former smoker.   Are obese.  Have a family history of kidney disease or failure.  Often take medicines that are damaging to the kidneys. What are the signs or symptoms? Symptoms of this condition include:  Swelling (edema) of the face, legs, ankles, or feet.  Tiredness (lethargy) and having less energy.  Nausea or vomiting.  Confusion or trouble concentrating.  Problems with urination, such as: ? Painful or burning feeling during urination. ? Decreased urine production. ? Frequent urination, especially at night. ? Bloody urine.  Muscle twitches and cramps, especially in the legs.  Shortness of breath.  Weakness.  Loss of appetite.  Metallic taste in the mouth.  Trouble sleeping.  Dry, itchy skin.  A low blood count (anemia).  Pale lining of the eyelids and surface of the eye (conjunctiva). Symptoms develop slowly and may not be obvious until the kidney damage becomes severe. It is possible to have kidney disease for years without having any symptoms. How is this diagnosed? This condition may be diagnosed based on:  Blood tests.  Urine tests.  Imaging tests, such as an ultrasound or CT scan.  A test in which a sample of tissue is removed from the kidneys to be examined under a microscope (kidney biopsy). These test results will help your health care provider determine how serious the CKD is. How is this treated? There is no cure for most cases of this condition, but treatment usually relieves symptoms and prevents or slows the progression of the disease. Treatment may include:  Making diet changes, which may require you to avoid alcohol, salty foods (sodium),   and foods that are high in potassium, calcium, and protein.  Medicines: ? To lower blood pressure. ? To control blood glucose. ? To relieve anemia. ? To relieve swelling. ? To protect your bones. ? To improve the balance of electrolytes in your blood.  Removing toxic waste from the body through types of dialysis, if  the kidneys can no longer do their job (kidney failure).  Managing any other conditions that are causing your CKD or making it worse. Follow these instructions at home: Medicines  Take over-the-counter and prescription medicines only as told by your health care provider. The dose of some medicines that you take may need to be adjusted.  Do not take any new medicines unless approved by your health care provider. Many medicines can worsen your kidney damage.  Do not take any vitamin and mineral supplements unless approved by your health care provider. Many nutritional supplements can worsen your kidney damage. General instructions  Follow your prescribed diet as told by your health care provider.  Do not use any products that contain nicotine or tobacco, such as cigarettes and e-cigarettes. If you need help quitting, ask your health care provider.  Monitor and track your blood pressure at home. Report changes in your blood pressure as told by your health care provider.  If you are being treated for diabetes, monitor and track your blood sugar (blood glucose) levels as told by your health care provider.  Maintain a healthy weight. If you need help with this, ask your health care provider.  Start or continue an exercise plan. Exercise at least 30 minutes a day, 5 days a week.  Keep your immunizations up to date as told by your health care provider.  Keep all follow-up visits as told by your health care provider. This is important. Where to find more information  American Association of Kidney Patients: www.aakp.org  National Kidney Foundation: www.kidney.org  American Kidney Fund: www.akfinc.org  [facility]: www.lifeoptions.org and www.kidneyschool.org Contact a health care provider if:  Your symptoms get worse.  You develop new symptoms. Get help right away if:  You develop symptoms of ESRD, which include: ? Headaches. ? Numbness in the hands or  feet. ? Easy bruising. ? Frequent hiccups. ? Chest pain. ? Shortness of breath. ? Lack of menstruation, in women.  You have a fever.  You have decreased urine production.  You have pain or bleeding when you urinate. Summary  Chronic kidney disease (CKD) occurs when the kidneys become damaged slowly over a long period of time.  The most common causes of this condition are diabetes and high blood pressure (hypertension).  There is no cure for most cases of this condition, but treatment usually relieves symptoms and prevents or slows the progression of the disease. Treatment may include a combination of medicines and lifestyle changes. This information is not intended to replace advice given to you by your health care provider. Make sure you discuss any questions you have with your health care provider. Document Revised: 07/15/2017 Document Reviewed: 09/09/2016 Elsevier Patient Education  2020 Elsevier Inc.  

## 2020-04-18 NOTE — Progress Notes (Signed)
Inpatient Diabetes Program Recommendations  AACE/ADA: New Consensus Statement on Inpatient Glycemic Control (2015)  Target Ranges:  Prepandial:   less than 140 mg/dL      Peak postprandial:   less than 180 mg/dL (1-2 hours)      Critically ill patients:  140 - 180 mg/dL   Results for Julie Bender, Julie Bender (MRN [id number]) as of 04/18/2020 08:21  Ref. Range 04/17/2020 08:24 04/17/2020 09:16 04/17/2020 11:58 04/17/2020 16:23 04/17/2020 21:39  Glucose-Capillary Latest Ref Range: 70 - 99 mg/dL 50 (L) 104 (H) 266 (H)  11 units NOVOLOG  272 (H)  11 units NOVOLOG  247 (H)  2 units NOVOLOG +  14 units LEVEMIR   Results for Julie Bender, Julie Bender (MRN [id number]) as of 04/18/2020 08:21  Ref. Range 04/18/2020 08:17  Glucose-Capillary Latest Ref Range: 70 - 99 mg/dL 40 (LL)     Home DM Meds: Levemir 42 units Daily       Lantus 12 units as needed       Novolog 5 units TID with meals       Victoza 0.6 mg Daily  Current Orders: Levemir 14 units QHS      Novolog 6 units TID with meals      Novolog Sensitive Correction Scale/ SSI (0-9 units) TID AC + HS     Solumedrol 20 mg daily.    MD- Note patient with Hypoglycemia yesterday AM after getting 17 units Levemir the night prior.  Levemir dose reduced to 14 units last PM.  Patient got reduced dose last night and CBG down to 40 mg/dl this AM.  Also had issues with elevated afternoon CBGs yesterday.  Please consider the following:  1. Reduce Levemir to 10 units QHS  2. Increase the Novolog Meal Coverage to 8 units TID with meals     --Will follow patient during hospitalization--  Wyn Quaker RN, MSN, CDE Diabetes Coordinator Inpatient Glycemic Control Team Team Pager: [phone] (8a-5p)

## 2020-04-18 NOTE — Discharge Summary (Signed)
[place] [place] [facility] [place] NAME: Julie Bender    MR#:  [id number]  DATE OF BIRTH:  [birth year]  DATE OF ADMISSION:  03/30/2020   ADMITTING PHYSICIAN: Julie Costa, MD  DATE OF DISCHARGE: 04/18/2020  PRIMARY CARE PHYSICIAN: Julie Pitch, MD   ADMISSION DIAGNOSIS:  SOB (shortness of breath) [R06.02] Acute on chronic diastolic CHF (congestive heart failure) ([place]) [I50.33] DISCHARGE DIAGNOSIS:  Principal Problem:   Acute on chronic diastolic CHF (congestive heart failure) (HCC) Active Problems:   SOB (shortness of breath)   Depression   Elevated troponin   Acute on chronic respiratory failure with hypoxia (HCC)   COPD (chronic obstructive pulmonary disease) (HCC)   Hypertension   Type II diabetes mellitus with renal manifestations (HCC)   CKD (chronic kidney disease), stage IIIa   Atrial fibrillation, chronic ([place])  SECONDARY DIAGNOSIS:   Past Medical History:  Diagnosis Date  . Acute on chronic respiratory failure with hypoxia ([place])   . Asthma   . CHF (congestive heart failure) ([place])   . Chronic heart failure with preserved ejection fraction ([place])   . Chronic obstructive asthma ([place])   . COPD (chronic obstructive pulmonary disease) ([place])   . Diabetes mellitus without complication ([place])   . Diabetic retinopathy ([place])   . End stage renal disease on dialysis ([place])   . History of placement of internal cardiac defibrillator   . Hypertension   . Hypertrophic cardiomyopathy ([place])   . Hypertrophic cardiomyopathy ([place])   . Morbid obesity ([place])   . Obstructive sleep apnea    HOSPITAL COURSE:  Julie Bender a 59 y.o.femalewith medical history significant ofhypertension, hyperlipidemia, diabetes mellitus, COPD on 3 L oxygen, asthma, depression, OSA, ICD placement,dCHF,HOCM s/p 09/2019 Dukemyectomy with a prolonged hospital course and multiple complications,CKD-III,A fib on Eliquis,whowas admitted for worsening shortness breath.  She also  has orthopnea. She was noted to be cyanotic in SNF. Oxygen desaturation to 88% on home level 3 L oxygen initially, currently improved to 93% on 3 L oxygen. Patient had mild confusion earlier, which has resolved. Currently patient is alert, oriented x3. Sheansweredall questions appropriately. Patient denies chest pain, fever or chills. She has dry cough. No nausea,vomiting, diarrhea, abdominal pain, symptoms of UTI. No unilateral numbness or tingling inextremities. Nofacial droop or slurred speech.Patient has history of ESRD. She used to be ondialysis. Patient states that she her kidney function improved. She does not need dialysis anymore. Last dialysis was more than amonth ago.  ED Course:pt was found to have BNP 1811, troponin 50, pending COVID-19 PCR, stable renal function, temperature normal, blood pressure 144/99, heart rate 73, RR 25, oxygen sat 93% on 3 L oxygen currently, chest x-ray showed cardiomegaly and vascular congestion. Patient is admitted to progressive bed as inpatient.   Acute on chronic diastolic CHF exacerbation:  Diuresed aggressively with Lasix drip. Echo shows EF 55-60%, grade II diastolic dysfunction. B/l swelling still present but continues to improve.  -11.8 L fluid balance. -After discussion with nephrology, transitioning her to oral torsemide 40 mg daily along with Aldactone 25 mg p.o. daily at discharge.    Acute on chronic hypoxic respiratory failure: secondary to pulmonary edema in setting of acute on chronic diastolic CHF exacerbation.   AKI on CKDIII a: Cr 1. 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 No intervention at this time as per vascular surg. Continue eliquis. Present on admission. Likely secondary to previously being on pressors  as per pt's sister   Leukocytosis: Improved  Elevated troponin: secondary to demand ischemia.   QT prolongation: w/ hx of PAF. Continue on metoprolol, amiodarone &  eliquis.   Depression: Continue on home dose of zoloft   COPD:continue on steroid taper & bronchodilators. Continue on supplemental oxygen. Encourage incentive spirometry   Hypertension: continue on metoprolol, lasix.   HLD: continue on statin   DM2: HbA1c 6.7 on 03/30/2020.  She has had some hypoglycemia episode will cut back on her Levemir to 5 units  at discharge  Anemia of chronic kidney disease: Hemoglobin 8.2 at discharge, stable  Severe debility: PT/OT recs SNF. Has not walked since Feb 2021 -going to SNF at discharge  Overall very poor prognosis. I highly recommend palliative care follow-up as an outpatient.  Very high risk for readmissions    DISCHARGE CONDITIONS:  Fair CONSULTS OBTAINED:   DRUG ALLERGIES:  No Known Allergies DISCHARGE MEDICATIONS:   Allergies as of 04/18/2020   No Known Allergies     Medication List    STOP taking these medications   furosemide 40 MG tablet Commonly known as: LASIX   insulin glargine 100 UNIT/ML Solostar Pen Commonly known as: LANTUS     TAKE these medications   acetaminophen 500 MG tablet Commonly known as: TYLENOL Take 1,000 mg by mouth every 6 (six) hours as needed.   albuterol 108 (90 Base) MCG/ACT inhaler Commonly known as: VENTOLIN HFA Inhale 1 puff into the lungs every 6 (six) hours as needed for wheezing or shortness of breath.   amiodarone 200 MG tablet Commonly known as: PACERONE Take 200 mg by mouth daily.   apixaban 5 MG Tabs tablet Commonly known as: ELIQUIS Take 5 mg by mouth 2 (two) times daily.   aspirin EC 81 MG tablet Take 81 mg by mouth daily.   atorvastatin 20 MG tablet Commonly known as: LIPITOR Take 20 mg by mouth daily.   cyanocobalamin 1000 MCG tablet Take 1,000 mcg by mouth daily.   dextromethorphan-guaiFENesin 30-600 MG 12hr tablet Commonly known as: MUCINEX DM Take 1 tablet by mouth 2 (two) times daily as needed for cough.   docusate sodium 100 MG capsule Commonly  known as: COLACE Take 100 mg by mouth 2 (two) times daily.   ferrous sulfate 325 (65 FE) MG tablet Take by mouth.   Fluticasone-Salmeterol 250-50 MCG/DOSE Aepb Commonly known as: ADVAIR Inhale 1 puff into the lungs 2 (two) times daily.   gabapentin 300 MG capsule Commonly known as: NEURONTIN Take 600 mg by mouth 4 (four) times daily.   insulin detemir 100 UNIT/ML injection Commonly known as: LEVEMIR Inject 0.05 mLs (5 Units total) into the skin daily. What changed: how much to take   liraglutide 18 MG/3ML Sopn Commonly known as: VICTOZA Inject 0.6 mg into the skin daily.   Melatonin 3 MG Tbdp Take by mouth.   metoprolol succinate 25 MG 24 hr tablet Commonly known as: TOPROL-XL Take 12.5 mg by mouth daily.   multivitamin Tabs tablet Take 1 tablet by mouth daily.   NovoLOG FlexPen 100 UNIT/ML FlexPen Generic drug: insulin aspart Inject 5 Units into the skin 3 (three) times daily with meals.   oxyCODONE 5 MG immediate release tablet Commonly known as: Oxy IR/ROXICODONE Take 1 tablet (5 mg total) by mouth every 6 (six) hours as needed for up to 3 days for moderate pain.   pantoprazole 40 MG tablet Commonly known as: PROTONIX Take by mouth.   polyethylene glycol powder 17 GM/SCOOP  powder Commonly known as: GLYCOLAX/MIRALAX Take 17 g by mouth daily.   potassium chloride SA 20 MEQ tablet Commonly known as: KLOR-CON Take 1 tablet (20 mEq total) by mouth daily.   sertraline 100 MG tablet Commonly known as: ZOLOFT Take by mouth.   spironolactone 25 MG tablet Commonly known as: ALDACTONE Take 1 tablet (25 mg total) by mouth daily.   torsemide 20 MG tablet Commonly known as: DEMADEX Take 2 tablets (40 mg total) by mouth daily.   traMADol 50 MG tablet Commonly known as: ULTRAM Take 50 mg by mouth every 6 (six) hours as needed.   zinc sulfate 220 (50 Zn) MG capsule Take by mouth.            Discharge Care Instructions  (From admission, onward)           Start     Ordered   04/18/20 0000  Discharge wound care:       Comments: As above   04/18/20 0820         DISCHARGE INSTRUCTIONS:   DIET:  Renal diet DISCHARGE CONDITION:  Fair ACTIVITY:  Activity as tolerated OXYGEN:  Home Oxygen: Yes.    Oxygen Delivery: 1 liters/min via Patient connected to nasal cannula oxygen DISCHARGE LOCATION:  nursing home -palliative care to follow as an outpatient.  If you experience worsening of your admission symptoms, develop shortness of breath, life threatening emergency, suicidal or homicidal thoughts you must seek medical attention immediately by calling 911 or calling your MD immediately  if symptoms less severe.  You Must read complete instructions/literature along with all the possible adverse reactions/side effects for all the Medicines you take and that have been prescribed to you. Take any new Medicines after you have completely understood and accpet all the possible adverse reactions/side effects.   Please note  You were cared for by a hospitalist during your hospital stay. If you have any questions about your discharge medications or the care you received while you were in the hospital after you are discharged, you can call the unit and asked to speak with the hospitalist on call if the hospitalist that took care of you is not available. Once you are discharged, your primary care physician will handle any further medical issues. Please note that NO REFILLS for any discharge medications will be authorized once you are discharged, as it is imperative that you return to your primary care physician (or establish a relationship with a primary care physician if you do not have one) for your aftercare needs so that they can reassess your need for medications and monitor your lab values.    On the day of Discharge:  VITAL SIGNS:  Blood pressure 136/87, pulse 74, temperature 98.7 F (37.1 C), temperature source Oral, resp. rate 18, height 5\' 4"   (1.626 m), weight 98 kg, SpO2 99 %. PHYSICAL EXAMINATION:  GENERAL:  59 y.o.-year-old patient lying in the bed with no acute distress.  General exam:Appearscalm and comfortable Respiratory system:diminished breath sounds b/l  Cardiovascular system:S1 &S2 +. No rubs, gallops or clicks.  Gastrointestinal system:Abdomen is obese, soft and nontender. Hypoactive bowel sounds heard. Central nervous system:Alert, and awake.Moves all 4 extremities Skin: Dry gangrene of the thumb and index finger on the right, index finger, third fourth and fifth finger on the left.  Bilateral TMA's, right is well-healed, left with some ulceration on the dorsum which is noninfected Psychiatry:Judgement and insight appear normal.Flat mood and affect DATA REVIEW:   CBC Recent Labs  Lab 04/18/20 0405  WBC 12.2*  HGB 8.2*  HCT 26.3*  PLT 224    Chemistries  Recent Labs  Lab 04/18/20 0405  NA 138  K 3.9  CL 97*  CO2 31  GLUCOSE 78  BUN 37*  CREATININE 1.33*  CALCIUM 9.7     Outpatient follow-up  Contact information for follow-up providers    [facility] Follow up on 05-14-2020.   Specialty: Cardiology Why: at 12:30pm. Enter through the [place] entrance.  Contact information: [place] [phone]       Schedule an appointment as soon as possible for a visit with Vascular Surgery.   Why: Please keep any previuously scheduled appointments / follow up with your Vascular Surgeon at [facility].        Minna Merritts, MD. Schedule an appointment as soon as possible for a visit on 04/25/2020.   Specialty: Cardiology Why: Appointment at [facility] information: [address] [place] [address] [phone]        Marcia Brash. Schedule an appointment as soon as possible for a visit in 1 week.   Specialty: Nephrology Why: Patient need to make a follow up  appointment. Contact information: [address] [place] [address] [phone]        Julie Pitch, MD. Schedule an appointment as soon as possible for a visit in 1 week.   Specialty: Family Medicine Why: Patient will need to make a follow up appointment. Contact information: [place] [address] [phone]            Contact information for after-discharge care    Destination    [facility] Preferred SNF .   Service: Skilled Nursing Contact information: [address] [place] [phone]                   Management plans discussed with the patient, nursing and they are in agreement.  CODE STATUS: DNR   TOTAL TIME TAKING CARE OF THIS PATIENT: 35 minutes.    Max Sane M.D on 04/18/2020 at 10:26 AM  [facility]   CC: Primary care physician; Julie Pitch, MD   Note: This dictation was prepared with Dragon dictation along with smaller phrase technology. Any transcriptional errors that result from this process are unintentional.

## 2020-04-18 NOTE — Progress Notes (Signed)
[facility]  ROUNDING NOTE   Subjective:  [name] is a 59 y.o. black female with hypertension, hyperlipidemia, diabetes mellitus type II, COPD, sleep apnea, depression, diastolic congestive heart failure with history of myomectomy, atrial fibrillation who was admitted to [facility] on 03/30/2020 for SOB (shortness of breath) [R06.02] Acute on chronic diastolic CHF (congestive heart failure) ([place]) [I50.33  Today,patient  found resting in bed, consuming breakfast.No acute distress noted, reports as 'feeling better'. She denies SOB, nausea, and vomiting. Recorded daily total UOP 750 ml  Objective:  Vital signs in last 24 hours:  Temp:  [97.5 F (36.4 C)-98.7 F (37.1 C)] 98 F (36.7 C) (09/03 1127) Pulse Rate:  [74-75] 75 (09/03 1127) Resp:  [15-19] 18 (09/03 1127) BP: (118-136)/(44-87) 136/44 (09/03 1127) SpO2:  [91 %-100 %] 100 % (09/03 1127) Weight:  [97.6 kg-98 kg] 98 kg (09/03 0519)  Weight change: -0.967 kg Filed Weights   04/17/20 0541 04/18/20 0128 04/18/20 0519  Weight: 98.6 kg 97.6 kg 98 kg    Intake/Output: I/O last 3 completed shifts: In: 5400 [P.O.:960; I.V.:281] Out: 750 [Urine:750]   Intake/Output this shift:  Total I/O In: 480 [P.O.:480] Out: 500 [Urine:500]  Physical Exam: General: Well appearing, in no acute distress  Head: Normocephalic,atraumatic  Eyes: Anicteric  Neck: Supple, trachea midline  Lungs:  Diminished at the bases  Heart: S1S2  Abdomen:  Soft, nontender, obese  Extremities:  + 1peripheral edema. +ischemic fingers.   Neurologic: Alert,oriented,answers simple questions appropriately  Skin: No acute lesions or rashes        Basic Metabolic Panel: Recent Labs  Lab 04/14/20 0539 04/14/20 0539 04/15/20 0938 04/15/20 0938 04/16/20 0548 04/17/20 0638 04/18/20 0405  NA 137  --  137  --  137 139 138  K 3.5  --  3.9  --  3.9 3.7 3.9  CL 95*  --  93*  --  95* 95* 97*  CO2 34*  --  32  --  32 34* 31  GLUCOSE 93  --   151*  --  108* 53* 78  BUN 37*  --  37*  --  38* 34* 37*  CREATININE 1.30*  --  1.22*  --  1.44* 1.29* 1.33*  CALCIUM 9.3   < > 9.5   < > 9.7 9.4 9.7   < > = values in this interval not displayed.    Liver Function Tests: No results for input(s): AST, ALT, ALKPHOS, BILITOT, PROT, ALBUMIN in the last 168 hours. No results for input(s): LIPASE, AMYLASE in the last 168 hours. No results for input(s): AMMONIA in the last 168 hours.  CBC: Recent Labs  Lab 04/14/20 0539 04/15/20 0938 04/16/20 0548 04/17/20 0638 04/18/20 0405  WBC 9.7 10.9* 10.7* 9.4 12.2*  HGB 7.8* 8.4* 8.3* 8.4* 8.2*  HCT 25.1* 26.9* 26.7* 25.9* 26.3*  MCV 81.5 81.3 81.4 79.2* 81.2  PLT 231 266 220 223 224    Cardiac Enzymes: No results for input(s): CKTOTAL, CKMB, CKMBINDEX, TROPONINI in the last 168 hours.  BNP: Invalid input(s): POCBNP  CBG: Recent Labs  Lab 04/17/20 1623 04/17/20 2139 04/18/20 0817 04/18/20 0928 04/18/20 1156  GLUCAP 272* 247* 40* 87 112*    Microbiology: Results for orders placed or performed during the hospital encounter of 03/30/20  SARS Coronavirus 2 by RT PCR (hospital order, performed in [facility] hospital lab) Nasopharyngeal Nasopharyngeal Swab     Status: None   Collection Time: 03/30/20  1:51 PM   Specimen:  Nasopharyngeal Swab  Result Value Ref Range Status   SARS Coronavirus 2 NEGATIVE NEGATIVE Final    Comment: (NOTE) SARS-CoV-2 target nucleic acids are NOT DETECTED.  The SARS-CoV-2 RNA is generally detectable in upper and lower respiratory specimens during the acute phase of infection. The lowest concentration of SARS-CoV-2 viral copies this assay can detect is 250 copies / mL. A negative result does not preclude SARS-CoV-2 infection and should not be used as the sole basis for treatment or other patient management decisions.  A negative result may occur with improper specimen collection / handling, submission of specimen other than nasopharyngeal swab,  presence of viral mutation(s) within the areas targeted by this assay, and inadequate number of viral copies (<250 copies / mL). A negative result must be combined with clinical observations, patient history, and epidemiological information.  Fact Sheet for Patients:   StrictlyIdeas.no  Fact Sheet for Healthcare Providers: BankingDealers.co.za  This test is not yet approved or  cleared by the [place] FDA and has been authorized for detection and/or diagnosis of SARS-CoV-2 by FDA under an Emergency Use Authorization (EUA).  This EUA will remain in effect (meaning this test can be used) for the duration of the COVID-19 declaration under Section 564(b)(1) of the Act, 21 U.S.C. section 360bbb-3(b)(1), unless the authorization is terminated or revoked sooner.  Performed at [facility], [address] [place], [place] [address]   MRSA PCR Screening     Status: None   Collection Time: 03/30/20 10:23 PM   Specimen: Nasal Mucosa; Nasopharyngeal  Result Value Ref Range Status   MRSA by PCR NEGATIVE NEGATIVE Final    Comment:        The GeneXpert MRSA Assay (FDA approved for NASAL specimens only), is one component of a comprehensive MRSA colonization surveillance program. It is not intended to diagnose MRSA infection nor to guide or monitor treatment for MRSA infections. Performed at [facility], [address] [place], [place] [address]   Respiratory Panel by RT PCR (Flu A&B, Covid) - Nasopharyngeal Swab     Status: None   Collection Time: 04/18/20 12:35 PM   Specimen: Nasopharyngeal Swab  Result Value Ref Range Status   SARS Coronavirus 2 by RT PCR NEGATIVE NEGATIVE Final    Comment: (NOTE) SARS-CoV-2 target nucleic acids are NOT DETECTED.  The SARS-CoV-2 RNA is generally detectable in upper respiratoy specimens during the acute phase of infection. The lowest concentration of SARS-CoV-2 viral copies  this assay can detect is 131 copies/mL. A negative result does not preclude SARS-Cov-2 infection and should not be used as the sole basis for treatment or other patient management decisions. A negative result may occur with  improper specimen collection/handling, submission of specimen other than nasopharyngeal swab, presence of viral mutation(s) within the areas targeted by this assay, and inadequate number of viral copies (<131 copies/mL). A negative result must be combined with clinical observations, patient history, and epidemiological information. The expected result is Negative.  Fact Sheet for Patients:  PinkCheek.be  Fact Sheet for Healthcare Providers:  GravelBags.it  This test is no t yet approved or cleared by the [place] FDA and  has been authorized for detection and/or diagnosis of SARS-CoV-2 by FDA under an Emergency Use Authorization (EUA). This EUA will remain  in effect (meaning this test can be used) for the duration of the COVID-19 declaration under Section 564(b)(1) of the Act, 21 U.S.C. section 360bbb-3(b)(1), unless the authorization is terminated or revoked sooner.  Influenza A by PCR NEGATIVE NEGATIVE Final   Influenza B by PCR NEGATIVE NEGATIVE Final    Comment: (NOTE) The Xpert Xpress SARS-CoV-2/FLU/RSV assay is intended as an aid in  the diagnosis of influenza from Nasopharyngeal swab specimens and  should not be used as a sole basis for treatment. Nasal washings and  aspirates are unacceptable for Xpert Xpress SARS-CoV-2/FLU/RSV  testing.  Fact Sheet for Patients: PinkCheek.be  Fact Sheet for Healthcare Providers: GravelBags.it  This test is not yet approved or cleared by the [place] FDA and  has been authorized for detection and/or diagnosis of SARS-CoV-2 by  FDA under an Emergency Use Authorization (EUA). This EUA  will remain  in effect (meaning this test can be used) for the duration of the  Covid-19 declaration under Section 564(b)(1) of the Act, 21  U.S.C. section 360bbb-3(b)(1), unless the authorization is  terminated or revoked. Performed at [facility], [address] [place], [place] [address]     Coagulation Studies: No results for input(s): LABPROT, INR in the last 72 hours.  Urinalysis: No results for input(s): COLORURINE, LABSPEC, PHURINE, GLUCOSEU, HGBUR, BILIRUBINUR, KETONESUR, PROTEINUR, UROBILINOGEN, NITRITE, LEUKOCYTESUR in the last 72 hours.  Invalid input(s): APPERANCEUR    Imaging: No results found.   Medications:   . sodium chloride 250 mL (04/07/20 2333)   . amiodarone  200 mg Oral Daily  . apixaban  5 mg Oral BID  . aspirin EC  81 mg Oral Daily  . atorvastatin  20 mg Oral Daily  . Chlorhexidine Gluconate Cloth  6 each Topical Daily  . docusate sodium  100 mg Oral BID  . feeding supplement (NEPRO CARB STEADY)  237 mL Oral BID BM  . ferrous sulfate  325 mg Oral Q breakfast  . gabapentin  100 mg Oral QID  . insulin aspart  0-5 Units Subcutaneous QHS  . insulin aspart  0-9 Units Subcutaneous TID WC  . insulin aspart  6 Units Subcutaneous TID WC  . insulin detemir  14 Units Subcutaneous QHS  . ipratropium-albuterol  3 mL Nebulization BID  . methylPREDNISolone (SOLU-MEDROL) injection  20 mg Intravenous Q24H  . metoprolol succinate  12.5 mg Oral Daily  . multivitamin  1 tablet Oral Daily  . multivitamin with minerals  1 tablet Oral Daily  . mupirocin cream   Topical BID  . pantoprazole  40 mg Oral Daily  . polyethylene glycol  17 g Oral Daily  . potassium chloride  20 mEq Oral Daily  . sertraline  100 mg Oral Daily  . sodium chloride flush  10-40 mL Intracatheter Q12H  . sodium chloride flush  3 mL Intravenous Q12H  . spironolactone  25 mg Oral Daily  . torsemide  40 mg Oral Daily  . cyanocobalamin  1,000 mcg Oral Daily  . zinc sulfate  220 mg  Oral Daily   sodium chloride, acetaminophen, albuterol, ALPRAZolam, dextromethorphan-guaiFENesin, hydrALAZINE, morphine injection, ondansetron (ZOFRAN) IV, oxyCODONE, sodium chloride flush, sodium chloride flush  Assessment/ Plan:   [name] is a 59 y.o. black female with hypertension, hyperlipidemia, diabetes mellitus type II, COPD, sleep apnea, depression, diastolic congestive heart failure with history of myomectomy, atrial fibrillation who was admitted to [facility] on 03/30/2020 for SOB (shortness of breath) [R06.02] Acute on chronic diastolic CHF (congestive heart failure) ([place]) [I50.33]  # Acute renal failure on chronic kidney disease stage IIIA with history of proteinuria:  Baseline creatinine of 1.04, GFR of 58 on 07/02/2019.  Chronic kidney disease secondary  to diabetes and hypertension Acute renal failure secondary to acute cardiorenal syndrome.  Not on an ACE-I/ARB at home.  Continue PO torsemide Continue spironolactone Cr 1.33 today  # Hypertension and acute exacerbation of chronic diastolic congestive heart failure.  Echocardiogram 03/31/20.  On metoprolol started spironolactone this admission  3. Anemia with renal failure:  Hgb 8.2 today Will  continue PO iron supplement  4. Hypokalemia:  Secondary to loop diuretics - PO potassium chloride.  - spironolactone  Improved to 3.9 today   Patient was seen and examined with Crosby Oyster, DNP. Above plan was discussed and agreed upon on the signing of this note.   Lavonia Dana , [facility]  9/3/20215:14 PM      LOS: [place] 9/3/20213:30 PM

## 2020-04-23 ENCOUNTER — Telehealth: Payer: Self-pay | Admitting: Family

## 2020-04-23 NOTE — Telephone Encounter (Signed)
Spoke to staff at [facility] who said she is doing fine since her discharge from hospital with no complaints at this time. She is in rehab so getting some exercise, following a low sodium diet, and taking her medications without issues. They confirmed her new patient CHF Clinic appointment with [place] for 9/15 at 1230pm   Urania Pearlman, NT

## 2020-04-24 ENCOUNTER — Inpatient Hospital Stay (HOSPITAL_COMMUNITY)
Admission: EM | Admit: 2020-04-24 | Discharge: 2020-05-16 | DRG: 177 | Disposition: E | Payer: Medicare Other | Attending: Critical Care Medicine | Admitting: Critical Care Medicine

## 2020-04-24 ENCOUNTER — Other Ambulatory Visit: Payer: Self-pay

## 2020-04-24 ENCOUNTER — Emergency Department (HOSPITAL_COMMUNITY): Payer: Medicare Other

## 2020-04-24 DIAGNOSIS — J440 Chronic obstructive pulmonary disease with acute lower respiratory infection: Secondary | ICD-10-CM | POA: Diagnosis present

## 2020-04-24 DIAGNOSIS — J189 Pneumonia, unspecified organism: Secondary | ICD-10-CM

## 2020-04-24 DIAGNOSIS — J9621 Acute and chronic respiratory failure with hypoxia: Secondary | ICD-10-CM | POA: Diagnosis present

## 2020-04-24 DIAGNOSIS — E785 Hyperlipidemia, unspecified: Secondary | ICD-10-CM | POA: Diagnosis present

## 2020-04-24 DIAGNOSIS — D631 Anemia in chronic kidney disease: Secondary | ICD-10-CM | POA: Diagnosis present

## 2020-04-24 DIAGNOSIS — J80 Acute respiratory distress syndrome: Secondary | ICD-10-CM | POA: Diagnosis present

## 2020-04-24 DIAGNOSIS — Z8249 Family history of ischemic heart disease and other diseases of the circulatory system: Secondary | ICD-10-CM

## 2020-04-24 DIAGNOSIS — I422 Other hypertrophic cardiomyopathy: Secondary | ICD-10-CM | POA: Diagnosis present

## 2020-04-24 DIAGNOSIS — Z01818 Encounter for other preprocedural examination: Secondary | ICD-10-CM

## 2020-04-24 DIAGNOSIS — E1129 Type 2 diabetes mellitus with other diabetic kidney complication: Secondary | ICD-10-CM | POA: Diagnosis present

## 2020-04-24 DIAGNOSIS — Z66 Do not resuscitate: Secondary | ICD-10-CM | POA: Diagnosis present

## 2020-04-24 DIAGNOSIS — I10 Essential (primary) hypertension: Secondary | ICD-10-CM | POA: Diagnosis present

## 2020-04-24 DIAGNOSIS — D696 Thrombocytopenia, unspecified: Secondary | ICD-10-CM | POA: Diagnosis present

## 2020-04-24 DIAGNOSIS — U071 COVID-19: Secondary | ICD-10-CM | POA: Diagnosis not present

## 2020-04-24 DIAGNOSIS — R0902 Hypoxemia: Secondary | ICD-10-CM

## 2020-04-24 DIAGNOSIS — I482 Chronic atrial fibrillation, unspecified: Secondary | ICD-10-CM | POA: Diagnosis present

## 2020-04-24 DIAGNOSIS — I421 Obstructive hypertrophic cardiomyopathy: Secondary | ICD-10-CM | POA: Diagnosis present

## 2020-04-24 DIAGNOSIS — F32A Depression, unspecified: Secondary | ICD-10-CM | POA: Diagnosis present

## 2020-04-24 DIAGNOSIS — E1122 Type 2 diabetes mellitus with diabetic chronic kidney disease: Secondary | ICD-10-CM | POA: Diagnosis present

## 2020-04-24 DIAGNOSIS — Z951 Presence of aortocoronary bypass graft: Secondary | ICD-10-CM

## 2020-04-24 DIAGNOSIS — Z95810 Presence of automatic (implantable) cardiac defibrillator: Secondary | ICD-10-CM

## 2020-04-24 DIAGNOSIS — G4733 Obstructive sleep apnea (adult) (pediatric): Secondary | ICD-10-CM | POA: Diagnosis present

## 2020-04-24 DIAGNOSIS — Z9981 Dependence on supplemental oxygen: Secondary | ICD-10-CM

## 2020-04-24 DIAGNOSIS — N179 Acute kidney failure, unspecified: Secondary | ICD-10-CM | POA: Diagnosis not present

## 2020-04-24 DIAGNOSIS — E11319 Type 2 diabetes mellitus with unspecified diabetic retinopathy without macular edema: Secondary | ICD-10-CM | POA: Diagnosis present

## 2020-04-24 DIAGNOSIS — R0600 Dyspnea, unspecified: Secondary | ICD-10-CM

## 2020-04-24 DIAGNOSIS — Z6830 Body mass index (BMI) 30.0-30.9, adult: Secondary | ICD-10-CM

## 2020-04-24 DIAGNOSIS — Z7901 Long term (current) use of anticoagulants: Secondary | ICD-10-CM

## 2020-04-24 DIAGNOSIS — N1832 Chronic kidney disease, stage 3b: Secondary | ICD-10-CM | POA: Diagnosis present

## 2020-04-24 DIAGNOSIS — I5033 Acute on chronic diastolic (congestive) heart failure: Secondary | ICD-10-CM | POA: Diagnosis present

## 2020-04-24 DIAGNOSIS — R0602 Shortness of breath: Secondary | ICD-10-CM | POA: Diagnosis not present

## 2020-04-24 DIAGNOSIS — Z4659 Encounter for fitting and adjustment of other gastrointestinal appliance and device: Secondary | ICD-10-CM

## 2020-04-24 DIAGNOSIS — Z7401 Bed confinement status: Secondary | ICD-10-CM

## 2020-04-24 DIAGNOSIS — Z7982 Long term (current) use of aspirin: Secondary | ICD-10-CM

## 2020-04-24 DIAGNOSIS — Z79899 Other long term (current) drug therapy: Secondary | ICD-10-CM

## 2020-04-24 DIAGNOSIS — F329 Major depressive disorder, single episode, unspecified: Secondary | ICD-10-CM | POA: Diagnosis present

## 2020-04-24 DIAGNOSIS — E1152 Type 2 diabetes mellitus with diabetic peripheral angiopathy with gangrene: Secondary | ICD-10-CM | POA: Diagnosis present

## 2020-04-24 DIAGNOSIS — N189 Chronic kidney disease, unspecified: Secondary | ICD-10-CM | POA: Diagnosis present

## 2020-04-24 DIAGNOSIS — G894 Chronic pain syndrome: Secondary | ICD-10-CM | POA: Diagnosis present

## 2020-04-24 DIAGNOSIS — Z794 Long term (current) use of insulin: Secondary | ICD-10-CM

## 2020-04-24 DIAGNOSIS — Z7722 Contact with and (suspected) exposure to environmental tobacco smoke (acute) (chronic): Secondary | ICD-10-CM | POA: Diagnosis present

## 2020-04-24 DIAGNOSIS — Z515 Encounter for palliative care: Secondary | ICD-10-CM | POA: Diagnosis not present

## 2020-04-24 DIAGNOSIS — R7989 Other specified abnormal findings of blood chemistry: Secondary | ICD-10-CM

## 2020-04-24 DIAGNOSIS — I96 Gangrene, not elsewhere classified: Secondary | ICD-10-CM | POA: Diagnosis present

## 2020-04-24 DIAGNOSIS — G9341 Metabolic encephalopathy: Secondary | ICD-10-CM | POA: Diagnosis not present

## 2020-04-24 DIAGNOSIS — I130 Hypertensive heart and chronic kidney disease with heart failure and stage 1 through stage 4 chronic kidney disease, or unspecified chronic kidney disease: Secondary | ICD-10-CM | POA: Diagnosis present

## 2020-04-24 DIAGNOSIS — J1282 Pneumonia due to coronavirus disease 2019: Secondary | ICD-10-CM | POA: Diagnosis present

## 2020-04-24 DIAGNOSIS — Z7951 Long term (current) use of inhaled steroids: Secondary | ICD-10-CM

## 2020-04-24 LAB — COMPREHENSIVE METABOLIC PANEL
ALT: 113 U/L — ABNORMAL HIGH (ref 0–44)
AST: 232 U/L — ABNORMAL HIGH (ref 15–41)
Albumin: 3.7 g/dL (ref 3.5–5.0)
Alkaline Phosphatase: 160 U/L — ABNORMAL HIGH (ref 38–126)
Anion gap: 15 (ref 5–15)
BUN: 26 mg/dL — ABNORMAL HIGH (ref 8–23)
CO2: 24 mmol/L (ref 22–32)
Calcium: 9.1 mg/dL (ref 8.9–10.3)
Chloride: 97 mmol/L — ABNORMAL LOW (ref 98–111)
Creatinine, Ser: 1.67 mg/dL — ABNORMAL HIGH (ref 0.44–1.00)
GFR calc Af Amer: 38 mL/min — ABNORMAL LOW (ref >60)
GFR calc non Af Amer: 33 mL/min — ABNORMAL LOW (ref >60)
Glucose, Bld: 218 mg/dL — ABNORMAL HIGH (ref 70–99)
Potassium: 4.8 mmol/L (ref 3.5–5.1)
Sodium: 136 mmol/L (ref 135–145)
Total Bilirubin: 2.7 mg/dL — ABNORMAL HIGH (ref 0.3–1.2)
Total Protein: 6.7 g/dL (ref 6.5–8.1)

## 2020-04-24 LAB — CBC WITH DIFFERENTIAL/PLATELET
Abs Immature Granulocytes: 0.06 10*3/uL (ref 0.00–0.07)
Basophils Absolute: 0 10*3/uL (ref 0.0–0.1)
Basophils Relative: 0 %
Eosinophils Absolute: 0 10*3/uL (ref 0.0–0.5)
Eosinophils Relative: 0 %
HCT: 25.6 % — ABNORMAL LOW (ref 36.0–46.0)
Hemoglobin: 7.8 g/dL — ABNORMAL LOW (ref 12.0–15.0)
Immature Granulocytes: 1 %
Lymphocytes Relative: 6 %
Lymphs Abs: 0.4 10*3/uL — ABNORMAL LOW (ref 0.7–4.0)
MCH: 24.7 pg — ABNORMAL LOW (ref 26.0–34.0)
MCHC: 30.5 g/dL (ref 30.0–36.0)
MCV: 81 fL (ref 80.0–100.0)
Monocytes Absolute: 0.5 10*3/uL (ref 0.1–1.0)
Monocytes Relative: 8 %
Neutro Abs: 5.6 10*3/uL (ref 1.7–7.7)
Neutrophils Relative %: 85 %
Platelets: 137 10*3/uL — ABNORMAL LOW (ref 150–400)
RBC: 3.16 MIL/uL — ABNORMAL LOW (ref 3.87–5.11)
RDW: 21.4 % — ABNORMAL HIGH (ref 11.5–15.5)
WBC: 6.6 10*3/uL (ref 4.0–10.5)
nRBC: 0 % (ref 0.0–0.2)

## 2020-04-24 LAB — BRAIN NATRIURETIC PEPTIDE: B Natriuretic Peptide: 2790 pg/mL — ABNORMAL HIGH (ref 0.0–100.0)

## 2020-04-24 MED ORDER — FUROSEMIDE 10 MG/ML IJ SOLN
40.0000 mg | Freq: Once | INTRAMUSCULAR | Status: AC
  Administered 2020-04-24: 40 mg via INTRAVENOUS
  Filled 2020-04-24: qty 4

## 2020-04-24 MED ORDER — SODIUM CHLORIDE 0.9 % IV SOLN
2.0000 g | Freq: Once | INTRAVENOUS | Status: DC
  Administered 2020-04-25: 2 g via INTRAVENOUS
  Filled 2020-04-24: qty 2

## 2020-04-24 NOTE — ED Provider Notes (Signed)
[facility] EMERGENCY DEPARTMENT Provider Note   CSN: [id number] Arrival date & time: 04/19/2020  1957     History Chief Complaint  Patient presents with  . Shortness of Breath    Julie Bender is a 59 y.o. female.  HPI     Patient presents with dyspnea from a nursing facility per Patient knowledges multiple medical issues including COPD, CHF, asthma. 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  Toes required amputation throughout, and all fingers are persistently painful diffusely severely, not improved codon. However, the patient was in her usual state of health yesterday when she developed dyspnea. Per EMS the patient required 15 L nonrebreather mask for appropriate saturation.  She is on home oxygen baseline 3 L nasal cannula. In route the patient was able to receive decreased amounts, and is now requiring 4 L via facemask for appropriate saturation.  Patient with complaints of anorexia, but denies nausea, vomiting.  Past Medical History:  Diagnosis Date  . Acute on chronic respiratory failure with hypoxia ([place])   . Asthma   . CHF (congestive heart failure) ([place])   . Chronic heart failure with preserved ejection fraction ([place])   . Chronic obstructive asthma ([place])   . COPD (chronic obstructive pulmonary disease) ([place])   . Diabetes mellitus without complication ([place])   . Diabetic retinopathy ([place])   . End stage renal disease on dialysis ([place])   . History of placement of internal cardiac defibrillator   . Hypertension   . Hypertrophic cardiomyopathy ([place])   . Hypertrophic cardiomyopathy ([place])   . Morbid obesity ([place])   . Obstructive sleep apnea     Patient Active Problem List   Diagnosis Date Noted  . Atrial fibrillation, chronic ([place]) 03/31/2020  . Acute on chronic diastolic CHF (congestive heart failure) ([place]) 03/30/2020  . CKD (chronic kidney  disease), stage IIIa 03/30/2020  . COPD (chronic obstructive pulmonary disease) ([place])   . Hypertension   . Type II diabetes mellitus with renal manifestations ([place])   . ESRD (end stage renal disease) ([place])   . Acute on chronic respiratory failure with hypoxia ([place])   . Obstructive sleep apnea   . Morbid obesity ([place])   . Chronic heart failure with preserved ejection fraction ([place])   . Hypertrophic cardiomyopathy ([place])   . End stage renal disease on dialysis ([place])   . Chronic obstructive asthma ([place])   . SOB (shortness of breath) 05/05/2018  . Elevated troponin 05/10/2017  . Depression 01/30/2014    Past Surgical History:  Procedure Laterality Date  . CARDIAC DEFIBRILLATOR PLACEMENT    . CHOLECYSTECTOMY    . IR FLUORO GUIDE CV LINE LEFT  04/09/2020  . RIGHT/LEFT HEART CATH AND CORONARY ANGIOGRAPHY N/A 06/19/2019   Procedure: RIGHT/LEFT HEART CATH AND CORONARY ANGIOGRAPHY;  Surgeon: Nelva Bush, MD;  Location: [facility];  Service: Cardiovascular;  Laterality: N/A;     OB History   No obstetric history on file.     Family History  Problem Relation Age of Onset  . Hypertrophic cardiomyopathy Sister     Social History   Tobacco Use  . Smoking status: Passive Smoke Exposure - Never Smoker  . Smokeless tobacco: Never Used  Vaping Use  . Vaping Use: Never used  Substance Use Topics  . Alcohol use: No  . Drug use: Never    Home Medications Prior to Admission medications  Medication Sig Start Date End Date Taking? Authorizing Provider  acetaminophen (TYLENOL) 500 MG tablet Take 1,000 mg by mouth every 6 (six) hours as needed.    [provider]  albuterol (VENTOLIN HFA) 108 (90 Base) MCG/ACT inhaler Inhale 1 puff into the lungs every 6 (six) hours as needed for wheezing or shortness of breath.    [provider]  amiodarone (PACERONE) 200 MG tablet Take 200 mg by mouth daily.  01/09/20   [provider]  apixaban (ELIQUIS) 5 MG TABS  tablet Take 5 mg by mouth 2 (two) times daily.  01/09/20   [provider]  aspirin EC 81 MG tablet Take 81 mg by mouth daily.    [provider]  atorvastatin (LIPITOR) 20 MG tablet Take 20 mg by mouth daily.    [provider]  cyanocobalamin 1000 MCG tablet Take 1,000 mcg by mouth daily.    [provider]  dextromethorphan-guaiFENesin (MUCINEX DM) 30-600 MG 12hr tablet Take 1 tablet by mouth 2 (two) times daily as needed for cough. 04/18/20   Max Sane, MD  docusate sodium (COLACE) 100 MG capsule Take 100 mg by mouth 2 (two) times daily.    [provider]  ferrous sulfate 325 (65 FE) MG tablet Take by mouth.    [provider]  Fluticasone-Salmeterol (ADVAIR) 250-50 MCG/DOSE AEPB Inhale 1 puff into the lungs 2 (two) times daily.    [provider]  gabapentin (NEURONTIN) 300 MG capsule Take 600 mg by mouth 4 (four) times daily.  09/24/19   [provider]  insulin aspart (NOVOLOG FLEXPEN) 100 UNIT/ML FlexPen Inject 5 Units into the skin 3 (three) times daily with meals.     [provider]  insulin detemir (LEVEMIR) 100 UNIT/ML injection Inject 0.05 mLs (5 Units total) into the skin daily. 04/18/20   Max Sane, MD  liraglutide (VICTOZA) 18 MG/3ML SOPN Inject 0.6 mg into the skin daily.  02/27/18   [provider]  Melatonin 3 MG TBDP Take by mouth. 01/09/20   [provider]  metoprolol succinate (TOPROL-XL) 25 MG 24 hr tablet Take 12.5 mg by mouth daily.  01/09/20 01/08/21  [provider]  multivitamin (RENA-VIT) TABS tablet Take 1 tablet by mouth daily. 01/09/20   [provider]  pantoprazole (PROTONIX) 40 MG tablet Take by mouth. 01/09/20 01/08/21  [provider]  polyethylene glycol powder (GLYCOLAX/MIRALAX) 17 GM/SCOOP powder Take 17 g by mouth daily.  01/09/20   [provider]  potassium chloride SA (KLOR-CON) 20 MEQ tablet Take 1 tablet (20 mEq total) by mouth  daily. 04/18/20 05/18/20  Max Sane, MD  sertraline (ZOLOFT) 100 MG tablet Take by mouth. 01/09/20 01/08/21  [provider]  spironolactone (ALDACTONE) 25 MG tablet Take 1 tablet (25 mg total) by mouth daily. 04/18/20 05/18/20  Max Sane, MD  torsemide (DEMADEX) 20 MG tablet Take 2 tablets (40 mg total) by mouth daily. 04/18/20 05/18/20  Max Sane, MD  traMADol (ULTRAM) 50 MG tablet Take 50 mg by mouth every 6 (six) hours as needed.     [provider]  zinc sulfate 220 (50 Zn) MG capsule Take by mouth. 01/09/20 01/08/21  [provider]    Allergies    Patient has no known allergies.  Review of Systems   Review of Systems  Constitutional:       Per HPI, otherwise negative  HENT:       Per HPI, otherwise negative  Respiratory:  Per HPI, otherwise negative  Cardiovascular:       Per HPI, otherwise negative  Gastrointestinal: Negative for vomiting.  Endocrine:       Negative aside from HPI  Genitourinary:       Neg aside from HPI   Musculoskeletal:       Per HPI, otherwise negative  Skin: Positive for color change and wound.  Neurological: Positive for weakness. Negative for syncope.    Physical Exam Updated Vital Signs BP (!) 146/83   Pulse 73   Temp 98.6 F (37 C)   Resp (!) 30   SpO2 98%   Physical Exam Vitals and nursing note reviewed.  Constitutional:      Appearance: She is obese. She is ill-appearing.  HENT:     Head: Normocephalic and atraumatic.  Eyes:     Conjunctiva/sclera: Conjunctivae normal.  Cardiovascular:     Rate and Rhythm: Regular rhythm. Tachycardia present.  Pulmonary:     Effort: Tachypnea present.     Breath sounds: Decreased breath sounds present.     Comments: Simple facemask in place, 4 L, diminished lung sounds, no audible wheezing Chest:     Chest wall: No tenderness.    Abdominal:     General: There is no distension.     Tenderness: There is no abdominal tenderness. There is no guarding.    Musculoskeletal:     Right lower leg: Edema present.     Left lower leg: Edema present.     Comments: Prior amputation all digits of the toes.  All fingers are necrotic  Skin:    General: Skin is warm and dry.  Neurological:     Mental Status: She is alert and oriented to person, place, and time.     Cranial Nerves: No cranial nerve deficit.     ED Results / Procedures / Treatments   Labs (all labs ordered are listed, but only abnormal results are displayed) Labs Reviewed  COMPREHENSIVE METABOLIC PANEL - Abnormal; Notable for the following components:      Result Value   Chloride 97 (*)    Glucose, Bld 218 (*)    BUN 26 (*)    Creatinine, Ser 1.67 (*)    AST 232 (*)    ALT 113 (*)    Alkaline Phosphatase 160 (*)    Total Bilirubin 2.7 (*)    GFR calc non Af Amer 33 (*)    GFR calc Af Amer 38 (*)    All other components within normal limits  CBC WITH DIFFERENTIAL/PLATELET - Abnormal; Notable for the following components:   RBC 3.16 (*)    Hemoglobin 7.8 (*)    HCT 25.6 (*)    MCH 24.7 (*)    RDW 21.4 (*)    Platelets 137 (*)    Lymphs Abs 0.4 (*)    All other components within normal limits  BRAIN NATRIURETIC PEPTIDE - Abnormal; Notable for the following components:   B Natriuretic Peptide 2,790.0 (*)    All other components within normal limits  SARS CORONAVIRUS 2 BY RT PCR [facility] ORDER, [place] LAB)  URINALYSIS, ROUTINE W REFLEX MICROSCOPIC    EKG EKG Interpretation  Date/Time:  Thursday April 24 2020 20:04:29 EDT Ventricular Rate:  75 PR Interval:    QRS Duration: 164 QT Interval:  468 QTC Calculation: 523 R Axis:   -76 Text Interpretation: ATRIAL PACED RHYTHM Artifact Abnormal ECG Confirmed by Carmin Muskrat [phone]) on 05/13/2020 8:39:01 PM  Radiology DG Chest Port 1 View  Result Date: 04/26/2020 CLINICAL DATA:  Shortness of breath EXAM: PORTABLE CHEST 1 VIEW COMPARISON:  03/30/2020 FINDINGS: Cardiomegaly. Diffuse  interstitial prominence. Patchy airspace disease throughout the right lung. Findings could reflect edema or infection. No effusions. No acute bony abnormality. IMPRESSION: Diffuse interstitial prominence with patchy right lung airspace disease. Findings could reflect asymmetric edema or infection. Electronically Signed   By: Rolm Baptise M.D.   On: 05/07/2020 20:42    Procedures Procedures (including critical care time)  Medications Ordered in ED Medications  furosemide (LASIX) injection 40 mg (40 mg Intravenous Given 04/17/2020 2339)    ED Course  I have reviewed the triage vital signs and the nursing notes.  Pertinent labs & imaging results that were available during my care of the patient were reviewed by me and considered in my medical decision making (see chart for details).  11:44 PM Patient's initial labs notable for hepatic abnormalities, though she has no abdominal pain, no nausea, no vomiting and no fever.  Additional findings concerning for slight worsening of her renal function, and notable elevation of her BNP. Patient has received IV Lasix. I have reviewed her x-ray with her, and with concern for pneumonia, likely contributing to her hypoxia, she is started antibiotics, this may be secondary to Covid, as she knows she has no vaccine protection.  This 59 year old female with multiple medical issues including immobile status, though anticoagulated, CHF, COPD, now presents with new oxygen requirement, cough sign of pneumonia on x-ray, multiple lab abnormalities, will require admission, and on signout is awaiting additional labs, including Covid testing.  Dr. Randal Buba is aware of the patient.  Final Clinical Impression(s) / ED Diagnoses Final diagnoses:  Hypoxia     Carmin Muskrat, MD 05/14/2020 [phone]

## 2020-04-24 NOTE — ED Notes (Signed)
Blossom Hoops (pt sister) called for update on pt, please call [phone]

## 2020-04-24 NOTE — ED Triage Notes (Signed)
BIB [facility] after staff at [facility] reported pt having increase SHOB. EMS reports that pt was placed on 15 L NRB at facility. During transit EMS able to ween pt down to 4L on simple face mask. EMS also reports that pt has had diarrhea x 2 days, loss of appetite for 2 days, and increase weakness. PT has hx of COPD, DM type 2,  and is on 3L [place] at baseline. Pt does haver a defibrillator. Upon arrival to facility, nurse observed nose bleeding.  Vitals HR 76 B/P 129/76 93% 4 L Simple Mask RR 30

## 2020-04-25 ENCOUNTER — Inpatient Hospital Stay (HOSPITAL_COMMUNITY): Payer: Medicare Other

## 2020-04-25 ENCOUNTER — Ambulatory Visit: Payer: Medicare Other | Admitting: Family

## 2020-04-25 ENCOUNTER — Other Ambulatory Visit: Payer: Self-pay

## 2020-04-25 DIAGNOSIS — Z66 Do not resuscitate: Secondary | ICD-10-CM

## 2020-04-25 DIAGNOSIS — E1122 Type 2 diabetes mellitus with diabetic chronic kidney disease: Secondary | ICD-10-CM | POA: Diagnosis present

## 2020-04-25 DIAGNOSIS — E11319 Type 2 diabetes mellitus with unspecified diabetic retinopathy without macular edema: Secondary | ICD-10-CM | POA: Diagnosis present

## 2020-04-25 DIAGNOSIS — I5033 Acute on chronic diastolic (congestive) heart failure: Secondary | ICD-10-CM | POA: Diagnosis present

## 2020-04-25 DIAGNOSIS — J9602 Acute respiratory failure with hypercapnia: Secondary | ICD-10-CM | POA: Diagnosis not present

## 2020-04-25 DIAGNOSIS — R531 Weakness: Secondary | ICD-10-CM | POA: Insufficient documentation

## 2020-04-25 DIAGNOSIS — I10 Essential (primary) hypertension: Secondary | ICD-10-CM

## 2020-04-25 DIAGNOSIS — I130 Hypertensive heart and chronic kidney disease with heart failure and stage 1 through stage 4 chronic kidney disease, or unspecified chronic kidney disease: Secondary | ICD-10-CM | POA: Diagnosis present

## 2020-04-25 DIAGNOSIS — U071 COVID-19: Secondary | ICD-10-CM | POA: Diagnosis present

## 2020-04-25 DIAGNOSIS — Z789 Other specified health status: Secondary | ICD-10-CM | POA: Insufficient documentation

## 2020-04-25 DIAGNOSIS — E1152 Type 2 diabetes mellitus with diabetic peripheral angiopathy with gangrene: Secondary | ICD-10-CM | POA: Diagnosis present

## 2020-04-25 DIAGNOSIS — I482 Chronic atrial fibrillation, unspecified: Secondary | ICD-10-CM

## 2020-04-25 DIAGNOSIS — G894 Chronic pain syndrome: Secondary | ICD-10-CM | POA: Diagnosis present

## 2020-04-25 DIAGNOSIS — Z515 Encounter for palliative care: Secondary | ICD-10-CM | POA: Diagnosis not present

## 2020-04-25 DIAGNOSIS — Z9911 Dependence on respirator [ventilator] status: Secondary | ICD-10-CM | POA: Diagnosis not present

## 2020-04-25 DIAGNOSIS — I422 Other hypertrophic cardiomyopathy: Secondary | ICD-10-CM | POA: Diagnosis present

## 2020-04-25 DIAGNOSIS — D696 Thrombocytopenia, unspecified: Secondary | ICD-10-CM | POA: Diagnosis present

## 2020-04-25 DIAGNOSIS — J029 Acute pharyngitis, unspecified: Secondary | ICD-10-CM

## 2020-04-25 DIAGNOSIS — D631 Anemia in chronic kidney disease: Secondary | ICD-10-CM | POA: Diagnosis present

## 2020-04-25 DIAGNOSIS — I96 Gangrene, not elsewhere classified: Secondary | ICD-10-CM | POA: Diagnosis present

## 2020-04-25 DIAGNOSIS — Z9981 Dependence on supplemental oxygen: Secondary | ICD-10-CM | POA: Diagnosis not present

## 2020-04-25 DIAGNOSIS — J9621 Acute and chronic respiratory failure with hypoxia: Secondary | ICD-10-CM | POA: Insufficient documentation

## 2020-04-25 DIAGNOSIS — J440 Chronic obstructive pulmonary disease with acute lower respiratory infection: Secondary | ICD-10-CM | POA: Diagnosis present

## 2020-04-25 DIAGNOSIS — E1129 Type 2 diabetes mellitus with other diabetic kidney complication: Secondary | ICD-10-CM

## 2020-04-25 DIAGNOSIS — J1282 Pneumonia due to coronavirus disease 2019: Secondary | ICD-10-CM | POA: Diagnosis present

## 2020-04-25 DIAGNOSIS — G4733 Obstructive sleep apnea (adult) (pediatric): Secondary | ICD-10-CM

## 2020-04-25 DIAGNOSIS — J80 Acute respiratory distress syndrome: Secondary | ICD-10-CM | POA: Diagnosis present

## 2020-04-25 DIAGNOSIS — J9601 Acute respiratory failure with hypoxia: Secondary | ICD-10-CM | POA: Diagnosis not present

## 2020-04-25 DIAGNOSIS — N179 Acute kidney failure, unspecified: Secondary | ICD-10-CM | POA: Diagnosis not present

## 2020-04-25 DIAGNOSIS — N189 Chronic kidney disease, unspecified: Secondary | ICD-10-CM | POA: Diagnosis present

## 2020-04-25 DIAGNOSIS — R7989 Other specified abnormal findings of blood chemistry: Secondary | ICD-10-CM | POA: Diagnosis present

## 2020-04-25 DIAGNOSIS — G9341 Metabolic encephalopathy: Secondary | ICD-10-CM | POA: Diagnosis not present

## 2020-04-25 DIAGNOSIS — E785 Hyperlipidemia, unspecified: Secondary | ICD-10-CM | POA: Diagnosis present

## 2020-04-25 DIAGNOSIS — R0602 Shortness of breath: Secondary | ICD-10-CM | POA: Diagnosis present

## 2020-04-25 DIAGNOSIS — Z01818 Encounter for other preprocedural examination: Secondary | ICD-10-CM

## 2020-04-25 DIAGNOSIS — N1832 Chronic kidney disease, stage 3b: Secondary | ICD-10-CM | POA: Diagnosis present

## 2020-04-25 DIAGNOSIS — I421 Obstructive hypertrophic cardiomyopathy: Secondary | ICD-10-CM | POA: Diagnosis present

## 2020-04-25 DIAGNOSIS — F329 Major depressive disorder, single episode, unspecified: Secondary | ICD-10-CM | POA: Diagnosis present

## 2020-04-25 DIAGNOSIS — R7401 Elevation of levels of liver transaminase levels: Secondary | ICD-10-CM | POA: Diagnosis not present

## 2020-04-25 DIAGNOSIS — Z7189 Other specified counseling: Secondary | ICD-10-CM | POA: Insufficient documentation

## 2020-04-25 LAB — URINALYSIS, ROUTINE W REFLEX MICROSCOPIC
Bilirubin Urine: NEGATIVE
Glucose, UA: NEGATIVE mg/dL
Ketones, ur: NEGATIVE mg/dL
Leukocytes,Ua: NEGATIVE
Nitrite: NEGATIVE
Protein, ur: 100 mg/dL — AB
Specific Gravity, Urine: 1.011 (ref 1.005–1.030)
pH: 6 (ref 5.0–8.0)

## 2020-04-25 LAB — CBC WITH DIFFERENTIAL/PLATELET
Abs Immature Granulocytes: 0.06 10*3/uL (ref 0.00–0.07)
Basophils Absolute: 0 10*3/uL (ref 0.0–0.1)
Basophils Relative: 0 %
Eosinophils Absolute: 0 10*3/uL (ref 0.0–0.5)
Eosinophils Relative: 0 %
HCT: 26.5 % — ABNORMAL LOW (ref 36.0–46.0)
Hemoglobin: 8.1 g/dL — ABNORMAL LOW (ref 12.0–15.0)
Immature Granulocytes: 1 %
Lymphocytes Relative: 9 %
Lymphs Abs: 0.6 10*3/uL — ABNORMAL LOW (ref 0.7–4.0)
MCH: 25.4 pg — ABNORMAL LOW (ref 26.0–34.0)
MCHC: 30.6 g/dL (ref 30.0–36.0)
MCV: 83.1 fL (ref 80.0–100.0)
Monocytes Absolute: 0.5 10*3/uL (ref 0.1–1.0)
Monocytes Relative: 9 %
Neutro Abs: 4.9 10*3/uL (ref 1.7–7.7)
Neutrophils Relative %: 81 %
Platelets: 127 10*3/uL — ABNORMAL LOW (ref 150–400)
RBC: 3.19 MIL/uL — ABNORMAL LOW (ref 3.87–5.11)
RDW: 21.7 % — ABNORMAL HIGH (ref 11.5–15.5)
WBC: 6.1 10*3/uL (ref 4.0–10.5)
nRBC: 0 % (ref 0.0–0.2)

## 2020-04-25 LAB — COMPREHENSIVE METABOLIC PANEL
ALT: 110 U/L — ABNORMAL HIGH (ref 0–44)
AST: 230 U/L — ABNORMAL HIGH (ref 15–41)
Albumin: 3.8 g/dL (ref 3.5–5.0)
Alkaline Phosphatase: 154 U/L — ABNORMAL HIGH (ref 38–126)
Anion gap: 14 (ref 5–15)
BUN: 28 mg/dL — ABNORMAL HIGH (ref 8–23)
CO2: 23 mmol/L (ref 22–32)
Calcium: 9 mg/dL (ref 8.9–10.3)
Chloride: 99 mmol/L (ref 98–111)
Creatinine, Ser: 1.65 mg/dL — ABNORMAL HIGH (ref 0.44–1.00)
GFR calc Af Amer: 38 mL/min — ABNORMAL LOW (ref >60)
GFR calc non Af Amer: 33 mL/min — ABNORMAL LOW (ref >60)
Glucose, Bld: 206 mg/dL — ABNORMAL HIGH (ref 70–99)
Potassium: 4.8 mmol/L (ref 3.5–5.1)
Sodium: 136 mmol/L (ref 135–145)
Total Bilirubin: 2.5 mg/dL — ABNORMAL HIGH (ref 0.3–1.2)
Total Protein: 6.7 g/dL (ref 6.5–8.1)

## 2020-04-25 LAB — TROPONIN I (HIGH SENSITIVITY)
Troponin I (High Sensitivity): 376 ng/L (ref <18)
Troponin I (High Sensitivity): 387 ng/L (ref <18)

## 2020-04-25 LAB — PROCALCITONIN
Procalcitonin: 0.83 ng/mL
Procalcitonin: 0.99 ng/mL

## 2020-04-25 LAB — CBG MONITORING, ED
Glucose-Capillary: 195 mg/dL — ABNORMAL HIGH (ref 70–99)
Glucose-Capillary: 216 mg/dL — ABNORMAL HIGH (ref 70–99)
Glucose-Capillary: 229 mg/dL — ABNORMAL HIGH (ref 70–99)
Glucose-Capillary: 244 mg/dL — ABNORMAL HIGH (ref 70–99)
Glucose-Capillary: 318 mg/dL — ABNORMAL HIGH (ref 70–99)

## 2020-04-25 LAB — TYPE AND SCREEN
ABO/RH(D): A POS
Antibody Screen: NEGATIVE

## 2020-04-25 LAB — D-DIMER, QUANTITATIVE: D-Dimer, Quant: 7.99 ug/mL-FEU — ABNORMAL HIGH (ref 0.00–0.50)

## 2020-04-25 LAB — C-REACTIVE PROTEIN: CRP: 8.4 mg/dL — ABNORMAL HIGH (ref <1.0)

## 2020-04-25 LAB — SARS CORONAVIRUS 2 BY RT PCR (HOSPITAL ORDER, PERFORMED IN ~~LOC~~ HOSPITAL LAB): SARS Coronavirus 2: POSITIVE — AB

## 2020-04-25 LAB — FERRITIN: Ferritin: 501 ng/mL — ABNORMAL HIGH (ref 11–307)

## 2020-04-25 MED ORDER — ASCORBIC ACID 500 MG PO TABS
500.0000 mg | ORAL_TABLET | Freq: Every day | ORAL | Status: DC
  Administered 2020-04-25 – 2020-04-27 (×3): 500 mg via ORAL
  Filled 2020-04-25 (×3): qty 1

## 2020-04-25 MED ORDER — SODIUM CHLORIDE 0.9 % IV SOLN
200.0000 mg | Freq: Once | INTRAVENOUS | Status: AC
  Administered 2020-04-25: 200 mg via INTRAVENOUS
  Filled 2020-04-25: qty 200

## 2020-04-25 MED ORDER — PHENOL 1.4 % MT LIQD
2.0000 | OROMUCOSAL | Status: DC | PRN
  Administered 2020-04-25 – 2020-04-26 (×5): 2 via OROMUCOSAL
  Filled 2020-04-25: qty 177

## 2020-04-25 MED ORDER — FUROSEMIDE 10 MG/ML IJ SOLN
40.0000 mg | Freq: Once | INTRAMUSCULAR | Status: AC
  Administered 2020-04-25: 40 mg via INTRAVENOUS
  Filled 2020-04-25: qty 4

## 2020-04-25 MED ORDER — FAMOTIDINE 20 MG PO TABS
20.0000 mg | ORAL_TABLET | Freq: Every day | ORAL | Status: DC
  Administered 2020-04-25 – 2020-04-27 (×3): 20 mg via ORAL
  Filled 2020-04-25 (×3): qty 1

## 2020-04-25 MED ORDER — INSULIN DETEMIR 100 UNIT/ML ~~LOC~~ SOLN
5.0000 [IU] | Freq: Every day | SUBCUTANEOUS | Status: DC
  Administered 2020-04-25: 5 [IU] via SUBCUTANEOUS
  Filled 2020-04-25 (×2): qty 0.05

## 2020-04-25 MED ORDER — INSULIN DETEMIR 100 UNIT/ML ~~LOC~~ SOLN: 0.1000 [IU]/kg | Freq: Two times a day (BID) | SUBCUTANEOUS | Status: DC

## 2020-04-25 MED ORDER — ACETAMINOPHEN 325 MG PO TABS
650.0000 mg | ORAL_TABLET | Freq: Four times a day (QID) | ORAL | Status: DC | PRN
  Administered 2020-04-25 – 2020-04-27 (×2): 650 mg via ORAL
  Filled 2020-04-25 (×2): qty 2

## 2020-04-25 MED ORDER — PANTOPRAZOLE SODIUM 40 MG PO TBEC
40.0000 mg | DELAYED_RELEASE_TABLET | Freq: Every day | ORAL | Status: DC
  Administered 2020-04-25 – 2020-04-27 (×3): 40 mg via ORAL
  Filled 2020-04-25 (×3): qty 1

## 2020-04-25 MED ORDER — METHYLPREDNISOLONE SODIUM SUCC 125 MG IJ SOLR
100.0000 mg | Freq: Two times a day (BID) | INTRAMUSCULAR | Status: AC
  Administered 2020-04-25 – 2020-04-27 (×6): 100 mg via INTRAVENOUS
  Filled 2020-04-25 (×6): qty 2

## 2020-04-25 MED ORDER — MOMETASONE FURO-FORMOTEROL FUM 200-5 MCG/ACT IN AERO
2.0000 | INHALATION_SPRAY | Freq: Two times a day (BID) | RESPIRATORY_TRACT | Status: DC
  Administered 2020-04-25 – 2020-04-27 (×5): 2 via RESPIRATORY_TRACT
  Filled 2020-04-25: qty 8.8

## 2020-04-25 MED ORDER — FUROSEMIDE 10 MG/ML IJ SOLN
80.0000 mg | Freq: Two times a day (BID) | INTRAMUSCULAR | Status: DC
  Administered 2020-04-25: 80 mg via INTRAVENOUS
  Filled 2020-04-25: qty 8

## 2020-04-25 MED ORDER — ASPIRIN EC 81 MG PO TBEC
81.0000 mg | DELAYED_RELEASE_TABLET | Freq: Every day | ORAL | Status: DC
  Administered 2020-04-25 – 2020-04-27 (×3): 81 mg via ORAL
  Filled 2020-04-25 (×3): qty 1

## 2020-04-25 MED ORDER — SPIRONOLACTONE 25 MG PO TABS
25.0000 mg | ORAL_TABLET | Freq: Every day | ORAL | Status: DC
  Administered 2020-04-25 – 2020-04-27 (×3): 25 mg via ORAL
  Filled 2020-04-25 (×3): qty 1

## 2020-04-25 MED ORDER — PREDNISONE 20 MG PO TABS: 50.0000 mg | ORAL_TABLET | Freq: Every day | ORAL | Status: DC

## 2020-04-25 MED ORDER — ONDANSETRON HCL 4 MG/2ML IJ SOLN: 4.0000 mg | Freq: Four times a day (QID) | INTRAMUSCULAR | Status: DC | PRN

## 2020-04-25 MED ORDER — VANCOMYCIN HCL 1500 MG/300ML IV SOLN
1500.0000 mg | Freq: Once | INTRAVENOUS | Status: DC
  Filled 2020-04-25: qty 300

## 2020-04-25 MED ORDER — ENSURE ENLIVE PO LIQD
237.0000 mL | Freq: Three times a day (TID) | ORAL | Status: DC
  Administered 2020-04-25 – 2020-04-27 (×4): 237 mL via ORAL
  Filled 2020-04-25 (×2): qty 237

## 2020-04-25 MED ORDER — APIXABAN 5 MG PO TABS
5.0000 mg | ORAL_TABLET | Freq: Two times a day (BID) | ORAL | Status: DC
  Administered 2020-04-25 – 2020-04-27 (×7): 5 mg via ORAL
  Filled 2020-04-25 (×7): qty 1

## 2020-04-25 MED ORDER — POLYETHYLENE GLYCOL 3350 17 G PO PACK
17.0000 g | PACK | Freq: Every day | ORAL | Status: DC
  Administered 2020-04-26 – 2020-04-27 (×2): 17 g via ORAL
  Filled 2020-04-25 (×3): qty 1

## 2020-04-25 MED ORDER — INSULIN ASPART 100 UNIT/ML ~~LOC~~ SOLN
0.0000 [IU] | Freq: Every day | SUBCUTANEOUS | Status: DC
  Administered 2020-04-25: 4 [IU] via SUBCUTANEOUS

## 2020-04-25 MED ORDER — TRAMADOL HCL 50 MG PO TABS
50.0000 mg | ORAL_TABLET | Freq: Four times a day (QID) | ORAL | Status: DC | PRN
  Administered 2020-04-25: 50 mg via ORAL
  Filled 2020-04-25 (×2): qty 1

## 2020-04-25 MED ORDER — SALINE SPRAY 0.65 % NA SOLN
1.0000 | NASAL | Status: DC | PRN
  Filled 2020-04-25: qty 44

## 2020-04-25 MED ORDER — FUROSEMIDE 10 MG/ML IJ SOLN
40.0000 mg | Freq: Two times a day (BID) | INTRAMUSCULAR | Status: DC
  Administered 2020-04-25: 40 mg via INTRAVENOUS
  Filled 2020-04-25: qty 4

## 2020-04-25 MED ORDER — GABAPENTIN 300 MG PO CAPS
600.0000 mg | ORAL_CAPSULE | Freq: Three times a day (TID) | ORAL | Status: DC
  Administered 2020-04-25 – 2020-04-26 (×7): 600 mg via ORAL
  Administered 2020-04-26: 300 mg via ORAL
  Administered 2020-04-27: 600 mg via ORAL
  Filled 2020-04-25 (×9): qty 2

## 2020-04-25 MED ORDER — INSULIN ASPART 100 UNIT/ML ~~LOC~~ SOLN
3.0000 [IU] | Freq: Three times a day (TID) | SUBCUTANEOUS | Status: DC
  Administered 2020-04-25 (×2): 3 [IU] via SUBCUTANEOUS

## 2020-04-25 MED ORDER — ZINC SULFATE 220 (50 ZN) MG PO CAPS
220.0000 mg | ORAL_CAPSULE | Freq: Every day | ORAL | Status: DC
  Administered 2020-04-25 – 2020-04-27 (×3): 220 mg via ORAL
  Filled 2020-04-25 (×3): qty 1

## 2020-04-25 MED ORDER — ONDANSETRON HCL 4 MG PO TABS
4.0000 mg | ORAL_TABLET | Freq: Four times a day (QID) | ORAL | Status: DC | PRN
  Administered 2020-04-27: 4 mg via ORAL
  Filled 2020-04-25: qty 1

## 2020-04-25 MED ORDER — SERTRALINE HCL 100 MG PO TABS
100.0000 mg | ORAL_TABLET | Freq: Every day | ORAL | Status: DC
  Administered 2020-04-25 – 2020-04-27 (×3): 100 mg via ORAL
  Filled 2020-04-25 (×3): qty 1

## 2020-04-25 MED ORDER — LINAGLIPTIN 5 MG PO TABS
5.0000 mg | ORAL_TABLET | Freq: Every day | ORAL | Status: DC
  Administered 2020-04-25 – 2020-04-27 (×3): 5 mg via ORAL
  Filled 2020-04-25 (×3): qty 1

## 2020-04-25 MED ORDER — IPRATROPIUM-ALBUTEROL 20-100 MCG/ACT IN AERS
1.0000 | INHALATION_SPRAY | Freq: Four times a day (QID) | RESPIRATORY_TRACT | Status: DC
  Administered 2020-04-25 – 2020-04-28 (×11): 1 via RESPIRATORY_TRACT
  Filled 2020-04-25: qty 4

## 2020-04-25 MED ORDER — VITAMIN B-12 1000 MCG PO TABS
1000.0000 ug | ORAL_TABLET | Freq: Every day | ORAL | Status: DC
  Administered 2020-04-25 – 2020-04-27 (×3): 1000 ug via ORAL
  Filled 2020-04-25 (×3): qty 1

## 2020-04-25 MED ORDER — FERROUS SULFATE 325 (65 FE) MG PO TABS
325.0000 mg | ORAL_TABLET | Freq: Every day | ORAL | Status: DC
  Administered 2020-04-25 – 2020-04-27 (×3): 325 mg via ORAL
  Filled 2020-04-25 (×3): qty 1

## 2020-04-25 MED ORDER — INSULIN ASPART 100 UNIT/ML ~~LOC~~ SOLN
0.0000 [IU] | Freq: Three times a day (TID) | SUBCUTANEOUS | Status: DC
  Administered 2020-04-25 (×3): 5 [IU] via SUBCUTANEOUS

## 2020-04-25 MED ORDER — AMIODARONE HCL 200 MG PO TABS
200.0000 mg | ORAL_TABLET | Freq: Every day | ORAL | Status: DC
  Administered 2020-04-25 – 2020-04-27 (×3): 200 mg via ORAL
  Filled 2020-04-25 (×3): qty 1

## 2020-04-25 MED ORDER — DM-GUAIFENESIN ER 30-600 MG PO TB12
1.0000 | ORAL_TABLET | Freq: Two times a day (BID) | ORAL | Status: DC | PRN
  Administered 2020-04-25 – 2020-04-27 (×6): 1 via ORAL
  Filled 2020-04-25 (×8): qty 1

## 2020-04-25 MED ORDER — ATORVASTATIN CALCIUM 10 MG PO TABS
20.0000 mg | ORAL_TABLET | Freq: Every day | ORAL | Status: DC
  Administered 2020-04-25 – 2020-04-27 (×3): 20 mg via ORAL
  Filled 2020-04-25 (×3): qty 2

## 2020-04-25 MED ORDER — SODIUM CHLORIDE 0.9 % IV SOLN
100.0000 mg | Freq: Every day | INTRAVENOUS | Status: AC
  Administered 2020-04-26 – 2020-04-29 (×4): 100 mg via INTRAVENOUS
  Filled 2020-04-25: qty 20
  Filled 2020-04-25: qty 100
  Filled 2020-04-25 (×4): qty 20

## 2020-04-25 NOTE — Progress Notes (Signed)
This chaplain attempted PMT consult for spiritual care with prayer in the ED.  The Pt. RN updated the chaplain.  The Pt. was sleeping and did not awaken to the call of her name. F/U spiritual care is available as needed.

## 2020-04-25 NOTE — Progress Notes (Signed)
Manufacturing engineer [facility]) Community Based Palliative Care       This patient has been referred to our palliative care services in the community.  [facility] will continue to follow for any discharge planning needs and to coordinate continuation of palliative care in the outpatient setting .   If you have questions or need assistance, please call [phone] or contact the hospital Liaison listed on AMION.     Thank you for the opportunity to participate in this patient's care.     Domenic Moras, BSN, RN [facility] Liaison   [phone]

## 2020-04-25 NOTE — ED Notes (Signed)
Palliative care team at bedside

## 2020-04-25 NOTE — ED Notes (Signed)
Julie Bender, sister would like an update [phone]

## 2020-04-25 NOTE — ED Notes (Signed)
Pt repositioned in the bed; sacral stage 2 wound noted and sacral foam applied. Pt changed into a gown.

## 2020-04-25 NOTE — H&P (Signed)
[facility] History and Physical   Patient: Julie Bender PPJ:[id number]   PCP: Juluis Pitch, MD DOB: 1961-03-06   DOA: 04/21/2020   DOS: 04/25/2020   DOS: the patient was seen and examined on 04/25/2020  Patient coming from: The patient is coming from SNF  Chief Complaint: Shortness of breath  HPI: Julie Bender is a 59 y.o. female with Past medical history of HTN, HLD, type II DM, COPD, chronic respiratory failure on 2 LPM, asthma, depression, OSA, ICD implant, chronic HFpEF, HOCM SP myomectomy, CKD stage IIIb, chronic A. fib on Eliquis presents with complaints of shortness of breath. Patient was recently hospitalized for heart failure exacerbation was treated aggressively with IV Lasix infusion and nephrology consultation.  Her Lasix was changed to torsemide and she was discharged to SNF. Today at SNF patient started having complaints of progressively worsening shortness of breath requiring 15 L of oxygen and EMS was called. Patient reports sore throat but no fever.  No chest pain.  No chest heaviness or chest tightness.  No nausea no vomiting.  No aspiration event.  No pain in her legs. She does have chronic swelling in her legs which is actually improving. At the time of my evaluation her breathing actually improved significantly. She remains compliant with her medication.  She was able to work with physical therapy at the SNF.  ED Course: Gradually oxygenation improved.  Currently on 4 LPM.  Covid test was positive.  Patient was given IV Lasix.  Initially thought to be having have considered pneumonia and was given IV antibiotics.   Review of Systems: as mentioned in the history of present illness.  All other systems reviewed and are negative.  Past Medical History:  Diagnosis Date  . Acute on chronic respiratory failure with hypoxia ([place])   . Asthma   . CHF (congestive heart failure) ([place])   . Chronic heart failure with preserved ejection fraction ([place])   . Chronic  obstructive asthma ([place])   . COPD (chronic obstructive pulmonary disease) ([place])   . Diabetes mellitus without complication ([place])   . Diabetic retinopathy ([place])   . End stage renal disease on dialysis ([place])   . History of placement of internal cardiac defibrillator   . Hypertension   . Hypertrophic cardiomyopathy ([place])   . Hypertrophic cardiomyopathy ([place])   . Morbid obesity ([place])   . Obstructive sleep apnea    Past Surgical History:  Procedure Laterality Date  . CARDIAC DEFIBRILLATOR PLACEMENT    . CHOLECYSTECTOMY    . IR FLUORO GUIDE CV LINE LEFT  04/09/2020  . RIGHT/LEFT HEART CATH AND CORONARY ANGIOGRAPHY N/A 06/19/2019   Procedure: RIGHT/LEFT HEART CATH AND CORONARY ANGIOGRAPHY;  Surgeon: Nelva Bush, MD;  Location: [place] [facility];  Service: Cardiovascular;  Laterality: N/A;   Social History:  reports that she is a non-smoker but has been exposed to tobacco smoke. She has never used smokeless tobacco. She reports that she does not drink alcohol and does not use drugs.  No Known Allergies  Family history reviewed and not pertinent Family History  Problem Relation Age of Onset  . Hypertrophic cardiomyopathy Sister      Prior to Admission medications   Medication Sig Start Date End Date Taking? Authorizing Provider  acetaminophen (TYLENOL) 500 MG tablet Take 1,000 mg by mouth every 6 (six) hours as needed for mild pain.    Yes [provider]  albuterol (VENTOLIN HFA) 108 (90 Base) MCG/ACT inhaler Inhale 1 puff into the lungs every  6 (six) hours as needed for wheezing or shortness of breath.   Yes [provider]  amiodarone (PACERONE) 200 MG tablet Take 200 mg by mouth daily.  01/09/20  Yes [provider]  apixaban (ELIQUIS) 5 MG TABS tablet Take 5 mg by mouth 2 (two) times daily.  01/09/20  Yes [provider]  aspirin EC 81 MG tablet Take 81 mg by mouth daily.   Yes [provider]  atorvastatin (LIPITOR) 20 MG tablet Take  20 mg by mouth daily.   Yes [provider]  cyanocobalamin 1000 MCG tablet Take 1,000 mcg by mouth daily.   Yes [provider]  docusate sodium (COLACE) 100 MG capsule Take 100 mg by mouth 2 (two) times daily.   Yes [provider]  ferrous sulfate 325 (65 FE) MG tablet Take 325 mg by mouth daily.    Yes [provider]  Fluticasone-Salmeterol (ADVAIR) 250-50 MCG/DOSE AEPB Inhale 1 puff into the lungs 2 (two) times daily.   Yes [provider]  gabapentin (NEURONTIN) 300 MG capsule Take 600 mg by mouth 4 (four) times daily.  09/24/19  Yes [provider]  insulin aspart (NOVOLOG FLEXPEN) 100 UNIT/ML FlexPen Inject 5 Units into the skin 3 (three) times daily with meals.    Yes [provider]  insulin detemir (LEVEMIR) 100 UNIT/ML injection Inject 0.05 mLs (5 Units total) into the skin daily. 04/18/20  Yes Max Sane, MD  liraglutide (VICTOZA) 18 MG/3ML SOPN Inject 0.6 mg into the skin daily.  02/27/18  Yes [provider]  Melatonin 3 MG TBDP Take 1 tablet by mouth at bedtime.  01/09/20  Yes [provider]  metoprolol succinate (TOPROL-XL) 25 MG 24 hr tablet Take 12.5 mg by mouth daily.  01/09/20 01/08/21 Yes [provider]  Multiple Vitamin (MULTIVITAMIN WITH MINERALS) TABS tablet Take 1 tablet by mouth daily.   Yes [provider]  oxycodone (OXY-IR) 5 MG capsule Take 5 mg by mouth every 6 (six) hours as needed for pain.   Yes [provider]  pantoprazole (PROTONIX) 40 MG tablet Take by mouth. 01/09/20 01/08/21 Yes [provider]  polyethylene glycol powder (GLYCOLAX/MIRALAX) 17 GM/SCOOP powder Take 17 g by mouth daily.  01/09/20  Yes [provider]  potassium chloride SA (KLOR-CON) 20 MEQ tablet Take 1 tablet (20 mEq total) by mouth daily. 04/18/20 05/18/20 Yes Max Sane, MD  sertraline (ZOLOFT) 100 MG tablet Take by mouth. 01/09/20 01/08/21 Yes [provider]    spironolactone (ALDACTONE) 25 MG tablet Take 1 tablet (25 mg total) by mouth daily. 04/18/20 05/18/20 Yes Max Sane, MD  torsemide (DEMADEX) 20 MG tablet Take 2 tablets (40 mg total) by mouth daily. 04/18/20 05/18/20 Yes Max Sane, MD  traMADol (ULTRAM) 50 MG tablet Take 50 mg by mouth every 6 (six) hours as needed for moderate pain.    Yes [provider]  zinc sulfate 220 (50 Zn) MG capsule Take 220 mg by mouth daily.  01/09/20 01/08/21 Yes [provider]  dextromethorphan-guaiFENesin (MUCINEX DM) 30-600 MG 12hr tablet Take 1 tablet by mouth 2 (two) times daily as needed for cough. Patient not taking: Reported on 04/25/2020 04/18/20   Max Sane, MD    Physical Exam: Vitals:   04/25/20 0000 04/25/20 0045 04/25/20 0100 04/25/20 0200  BP: (!) 150/57 (!) 125/58 136/82 (!) 148/76  Pulse: 74 73 73 74  Resp: (!) 30 (!) 25 19 (!) 29  Temp:  SpO2: 97% 100% 95% 96%    General: alert and oriented to time, place, and person. Appear in mild distress, affect appropriate Eyes: PERRL, Conjunctiva normal ENT: Oral Mucosa Clear, moist  Neck: difficult to assess  JVD, no Abnormal Mass Or lumps Cardiovascular: S1 and S2 Present, no Murmur, peripheral pulses symmetrical Respiratory: increased respiratory effort, Bilateral Air entry equal and Decreased, no signs of accessory muscle use, basal Crackles, no wheezes Abdomen: Bowel Sound present, Soft and no tenderness, no hernia Skin: no rashes  Extremities: bilateral  Pedal edema, no calf tenderness Neurologic: without any new focal findings Gait not checked due to patient safety concerns  Data Reviewed: I have personally reviewed and interpreted labs, imaging as discussed below.  CBC: Recent Labs  Lab 04/18/20 0405 04/17/2020 2056  WBC 12.2* 6.6  NEUTROABS  --  5.6  HGB 8.2* 7.8*  HCT 26.3* 25.6*  MCV 81.2 81.0  PLT 224 564*   Basic Metabolic Panel: Recent Labs  Lab 04/18/20 0405 05/14/2020 2056  NA 138 136  K 3.9 4.8   CL 97* 97*  CO2 31 24  GLUCOSE 78 218*  BUN 37* 26*  CREATININE 1.33* 1.67*  CALCIUM 9.7 9.1   GFR: Estimated Creatinine Clearance: 40.2 mL/min (A) (by C-G formula based on SCr of 1.67 mg/dL (H)). Liver Function Tests: Recent Labs  Lab 05/02/2020 2056  AST 232*  ALT 113*  ALKPHOS 160*  BILITOT 2.7*  PROT 6.7  ALBUMIN 3.7   No results for input(s): LIPASE, AMYLASE in the last 168 hours. No results for input(s): AMMONIA in the last 168 hours. Coagulation Profile: No results for input(s): INR, PROTIME in the last 168 hours. Cardiac Enzymes: No results for input(s): CKTOTAL, CKMB, CKMBINDEX, TROPONINI in the last 168 hours. BNP (last 3 results) No results for input(s): PROBNP in the last 8760 hours. HbA1C: No results for input(s): HGBA1C in the last 72 hours. CBG: Recent Labs  Lab 04/18/20 0817 04/18/20 0928 04/18/20 1156  GLUCAP 40* 87 112*   Lipid Profile: No results for input(s): CHOL, HDL, LDLCALC, TRIG, CHOLHDL, LDLDIRECT in the last 72 hours. Thyroid Function Tests: No results for input(s): TSH, T4TOTAL, FREET4, T3FREE, THYROIDAB in the last 72 hours. Anemia Panel: No results for input(s): VITAMINB12, FOLATE, FERRITIN, TIBC, IRON, RETICCTPCT in the last 72 hours. Urine analysis:    Component Value Date/Time   COLORURINE YELLOW 04/25/2020 0056   APPEARANCEUR HAZY (A) 04/25/2020 0056   LABSPEC 1.011 04/25/2020 0056   PHURINE 6.0 04/25/2020 0056   GLUCOSEU NEGATIVE 04/25/2020 0056   HGBUR SMALL (A) 04/25/2020 0056   BILIRUBINUR NEGATIVE 04/25/2020 0056   KETONESUR NEGATIVE 04/25/2020 0056   PROTEINUR 100 (A) 04/25/2020 0056   NITRITE NEGATIVE 04/25/2020 0056   LEUKOCYTESUR NEGATIVE 04/25/2020 0056    Radiological Exams on Admission: DG Chest Port 1 View  Result Date: 05/02/2020 CLINICAL DATA:  Shortness of breath EXAM: PORTABLE CHEST 1 VIEW COMPARISON:  03/30/2020 FINDINGS: Cardiomegaly. Diffuse interstitial prominence. Patchy airspace disease throughout  the right lung. Findings could reflect edema or infection. No effusions. No acute bony abnormality. IMPRESSION: Diffuse interstitial prominence with patchy right lung airspace disease. Findings could reflect asymmetric edema or infection. Electronically Signed   By: Rolm Baptise M.D.   On: 04/25/2020 20:42   EKG: Independently reviewed. Paced rhythm. Echocardiogram: 55 to 60% EF, grade 2 diastolic dysfunction without any significant valvular abnormality.  Done on 03/31/2020.  I reviewed all nursing notes, pharmacy notes, vitals, pertinent old records.  Assessment/Plan 1.  Acute on chronic respiratory failure with hypoxemia (HCC) Acute COVID-19 Viral Pneumonia CXR: hazy bilateral peripheral opacities  Oxygen requirement: 4 LPM, 15 LPM on arrival to ER, 3 LPM at baseline, CRP: Pending Remdesivir: Initiated on 04/25/2020 Steroids: Solu-Medrol initiated on 04/25/2020 Baricitinib/Actemra(off-label use): Discussed with the patient.  Patient currently agreeable. The investigational nature of this medication was discussed with the patient/HCPOA and they choose to proceed as the potential benefits are felt to outweigh risks at this time.  Antibiotics: Received 1 dose of cefepime.  Currently discontinued.  Procalcitonin pending. Vitamin C and Zinc: Initiated on 04/25/2020 DVT Prophylaxis: Therapeutic apixaban (ELIQUIS) tablet 5 mg    Prone positioning and incentive spirometer use recommended.  The treatment plan and use of medications and known side effects were discussed with patient/family. It was clearly explained that Complete risks and long-term side effects are unknown, however in the best clinical judgment they seem to be of some clinical benefit rather than medical risks. Patient/family agree with the treatment plan and want to receive these treatments as indicated.   2.  Acute on chronic diastolic CHF. Treated with IV diuresis recently. -12 L during that admission. Continue with IV Lasix for  now. We will monitor renal function. Continue Aldactone.  3.  Chronic A. fib. S/p.  ICD implant. Hyperlipidemia Continue amiodarone, continue Eliquis, continue aspirin. Continue Lipitor.  4.  Dry gangrene. Chronic pain syndrome. Continue gabapentin.  5.  Depression. Continue torsemide.  6. Essential hypertension. Blood pressure currently controlled. We will continue diuretics but hold rest of the medication for now to allow for aggressive diuresis.  7.  Acute kidney injury on chronic kidney disease stage IIIb. Baseline serum creatinine around 1.3. Currently serum creatinine 1.6. Monitor with diuresis. Likely cardiorenal hemodynamics.  8.  Type 2 diabetes mellitus with renal complication. Controlled with 6.7 hemoglobin A1c. Currently continue Levemir, sliding scale insulin as well.  9.  Goals of care conversation. Recurrent admissions for heart failure. Recently seen by palliative care in the hospital and was transition from full code to DNR. Verified CODE STATUS with the patient. Palliative care also consulted. Nutrition: Cardiac diet DVT Prophylaxis: Therapeutic Anticoagulation with Eliquis  Advance goals of care discussion: DNR   Consults: none   Family Communication: no family was present at bedside, at the time of interview.   Disposition:  From: SNF Likely will need SNF on discharge.   Author: Berle Mull, MD [facility] 04/25/2020 2:19 AM   To reach On-call, see care teams to locate the attending and reach out to them via [email or web address]. If 7PM-7AM, please contact night-coverage If you still have difficulty reaching the attending provider, please page the [facility] (Director on Call) for [facility] on amion for assistance.

## 2020-04-25 NOTE — ED Notes (Signed)
[name] and [name] would like an update

## 2020-04-25 NOTE — Consult Note (Signed)
Consultation Note Date: 04/25/2020   Patient Name: Julie Bender  DOB: [birth year]  MRN: [id number]  Age / Sex: 59 y.o., female  PCP: Juluis Pitch, MD Referring Physician: Aileen Fass, Tammi Klippel, MD  Reason for Consultation: Establishing goals of care  HPI/Patient Profile: 59 y.o. 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 She was admitted on 05/01/2020 with acute on chronic respiratory failure with hypoxemia secondary to COVID pneumonia and acute, chronic diastolic CHF, AKI on CKD.  Of note, patient was recently admitted at [facility] from 8/15-04/18/20 for acute on chronic respiratory failure with hypoxia due to acute on chronic CHF. She was seen my PMT during that visit and connected with outpatient PC through [facility].    Patient  faces treatment option decisions, advanced directive decisions, and anticipatory care needs.   Clinical Assessment and Goals of Care: I have reviewed medical records including EPIC notes, labs, and imaging. Received report from primary RN - no acute concerns.  Went to visit patient at bedside - she was lying in bed awake, alert, oriented, and able to participate in conversation. She stated her pain was an 8/10 in her throat from coughing. She does have an occasional productive cough. She was on a non-rebreather during visit today - denied any significant shortness of breath. Her biggest concern is her sore throat.   Met with patient  to discuss diagnosis, prognosis, GOC, EOL wishes, disposition, and options.  I introduced Palliative Medicine as specialized medical care for people living with serious illness. It focuses on providing relief from the symptoms and stress of a serious  illness. The goal is to improve quality of life for both the patient and the family.  We discussed a brief life review of the patient. The patient states that she is divorced and has three children; however, her 2 sisters are her primary support and both share HCPOA for her.   As far as functional and nutritional status, the patient shares that she has been living in facilities since February 2021 when she had her heart procedure at [facility]. Prior to this procedure, she was able to live independently, but reports that since February she has been bed bound. She was at [facility], admitted to [facility] from 8/15-04/18/20, then discharged to [facility], and is now admitted at [facility]. She reports she has been eating well, but today she finds it hard to eat because she cannot tolerate removing the nonrebreather mask for long periods of time to chew/swallow her food. She asked if she could have a nutritional supplement as she finds it easier to drink than eat at times - I told her I would have to take her DM into consideration and would reach out to our dietitians for a recommendation.    We discussed patient's current illness and what it means in  the larger context of patient's on-going co-morbidities. The patient had a clear understanding of her current medical situation. Natural disease trajectory and expectations at EOL were discussed. I attempted to elicit values and goals of care important to the patient. The difference between aggressive medical intervention and comfort care was considered in light of the patient's goals of care.   Advance directives, concepts specific to code status, artificial feeding and hydration, and rehospitalization were considered and discussed. I introduced the MOST form - reviewed and completed form today. Patient expressed that her goal was to not be rehospitalized after she is discharged. I introduced and educated on hospice philosophy and services. Patient stated that she was  still interested in receiving PT to try and help maintain some of the function she does have despite being bed bound. We discussed hospice vs outpatient palliative care in detail. The patient decided that she wanted to continue full scope treatment while in house, receive PT, discharge with outpatient PC. She is open to hospice services if she further declines during this admission or when it is clinically indicated outpatient to prevent a rehospitalization.   Discussed with patient the importance of continued conversation with family and the medical providers regarding overall plan of care and treatment options, ensuring decisions are within the context of the patient's values and GOCs.    Questions and concerns were addressed. The family was encouraged to call with questions or concerns. PMT card was provided.    Primary Decision Maker: PATIENT    SUMMARY OF RECOMMENDATIONS    Continue full scope medical treatment - patient wants to treat the treatable with watchful waiting  Continue DNR/DNI as previously documented  Patient was previously seen by PMT during her hospitalization in August - she was set up with outpatient PC through [facility] at that time  Patient is interested in PT evaluation - she wants to try and maintain function she does have despite being bed bound   If PT recommends outpatient PT (and patient does not decompensate during this admission), patient is still appropriate for outpatient Palliative Care to follow. Patient is open for transition to hospice services in the future to prevent rehospitalization   Once discharged, the patient does not wish to be rehospitalized  If patient decompensates during this admission she would very likely qualify for hospice services. The patient is open to hospice depending on hospital course.  MOST form completed as follows: DNR, Comfort Measures, Determine use or limitation of antibiotics when infection occurs, IV fluids for a defined trial  period, No feeding tube  Copy of MOST form was made and will be scanned into medical record/Vynca  Chaplain consult placed for prayer and emotional support per patient request  Discussed case with Dietitian Gustavus Bryant: recommended Ensure due to patient being COVID+. I appreciate her assistance. Ensure TID between meals ordered for nutritional supplement.  Chloraseptic mouth spray PRN ordered for sore throat  PMT will continue to follow peripherally. If there are any imminent needs please call the service directly  Code Status/Advance Care Planning:  DNR   Palliative Prophylaxis:   Aspiration, Bowel Regimen, Delirium Protocol, Frequent Pain Assessment and Turn Reposition  Additional Recommendations (Limitations, Scope, Preferences):  Full Scope Treatment  Psycho-social/Spiritual:   Desire for further Chaplaincy support:yes  Created space and opportunity for patient to express thoughts and feelings regarding patient's current medical situation.   Prognosis:   Unable to determine  guarded  Discharge Planning: To Be Determined      Primary  Diagnoses: Present on Admission: . Depression . Obstructive sleep apnea . Morbid obesity ([place]) . Hypertension . Type II diabetes mellitus with renal manifestations ([place]) . Atrial fibrillation, chronic ([place]) . Anemia due to chronic kidney disease . Acute kidney injury superimposed on chronic kidney disease IIIb ([place]) . Thrombocytopenia ([place]) . Elevated LFTs . Acute on chronic diastolic CHF (congestive heart failure) ([place]) . Acute on chronic respiratory failure with hypoxia ([place])   I have reviewed the medical record, interviewed the patient and family, and examined the patient. The following aspects are pertinent.  Past Medical History:  Diagnosis Date  . Acute on chronic respiratory failure with hypoxia ([place])   . Asthma   . CHF (congestive heart failure) ([place])   . Chronic heart failure with preserved ejection fraction ([place])    . Chronic obstructive asthma ([place])   . COPD (chronic obstructive pulmonary disease) ([place])   . Diabetes mellitus without complication ([place])   . Diabetic retinopathy ([place])   . End stage renal disease on dialysis ([place])   . History of placement of internal cardiac defibrillator   . Hypertension   . Hypertrophic cardiomyopathy ([place])   . Hypertrophic cardiomyopathy ([place])   . Morbid obesity ([place])   . Obstructive sleep apnea    Social History   Socioeconomic History  . Marital status: Divorced    Spouse name: Not on file  . Number of children: Not on file  . Years of education: Not on file  . Highest education level: Not on file  Occupational History  . Not on file  Tobacco Use  . Smoking status: Passive Smoke Exposure - Never Smoker  . Smokeless tobacco: Never Used  Vaping Use  . Vaping Use: Never used  Substance and Sexual Activity  . Alcohol use: No  . Drug use: Never  . Sexual activity: Not on file  Other Topics Concern  . Not on file  Social History Narrative  . Not on file   Social Determinants of Health   Financial Resource Strain:   . Difficulty of Paying Living Expenses: Not on file  Food Insecurity:   . Worried About Charity fundraiser in the Last Year: Not on file  . Ran Out of Food in the Last Year: Not on file  Transportation Needs:   . Lack of Transportation (Medical): Not on file  . Lack of Transportation (Non-Medical): Not on file  Physical Activity:   . Days of Exercise per Week: Not on file  . Minutes of Exercise per Session: Not on file  Stress:   . Feeling of Stress : Not on file  Social Connections:   . Frequency of Communication with Friends and Family: Not on file  . Frequency of Social Gatherings with Friends and Family: Not on file  . Attends Religious Services: Not on file  . Active Member of Clubs or Organizations: Not on file  . Attends Archivist Meetings: Not on file  . Marital Status: Not on file   Family History    Problem Relation Age of Onset  . Hypertrophic cardiomyopathy Sister    Scheduled Meds: . amiodarone  200 mg Oral Daily  . apixaban  5 mg Oral BID  . vitamin C  500 mg Oral Daily  . aspirin EC  81 mg Oral Daily  . atorvastatin  20 mg Oral Daily  . famotidine  20 mg Oral Daily  . ferrous sulfate  325 mg Oral Q breakfast  . furosemide  80 mg  Intravenous BID  . gabapentin  600 mg Oral TID AC & HS  . insulin aspart  0-15 Units Subcutaneous TID WC  . insulin aspart  0-5 Units Subcutaneous QHS  . insulin aspart  3 Units Subcutaneous TID WC  . insulin detemir  5 Units Subcutaneous Daily  . Ipratropium-Albuterol  1 puff Inhalation Q6H  . linagliptin  5 mg Oral Daily  . methylPREDNISolone (SOLU-MEDROL) injection  100 mg Intravenous BID   Followed by  . [START ON 04/28/2020] predniSONE  50 mg Oral Daily  . mometasone-formoterol  2 puff Inhalation BID  . pantoprazole  40 mg Oral Daily  . polyethylene glycol  17 g Oral Daily  . sertraline  100 mg Oral Daily  . spironolactone  25 mg Oral Daily  . cyanocobalamin  1,000 mcg Oral Daily  . zinc sulfate  220 mg Oral Daily   Continuous Infusions: . [START ON 04/26/2020] remdesivir 100 mg in NS 100 mL     PRN Meds:.acetaminophen, dextromethorphan-guaiFENesin, ondansetron **OR** ondansetron (ZOFRAN) IV, traMADol Medications Prior to Admission:  Prior to Admission medications   Medication Sig Start Date End Date Taking? Authorizing Provider  acetaminophen (TYLENOL) 500 MG tablet Take 1,000 mg by mouth every 6 (six) hours as needed for mild pain.    Yes [provider]  albuterol (VENTOLIN HFA) 108 (90 Base) MCG/ACT inhaler Inhale 1 puff into the lungs every 6 (six) hours as needed for wheezing or shortness of breath.   Yes [provider]  amiodarone (PACERONE) 200 MG tablet Take 200 mg by mouth daily.  01/09/20  Yes [provider]  apixaban (ELIQUIS) 5 MG TABS tablet Take 5 mg by mouth 2 (two) times daily.  01/09/20  Yes  [provider]  aspirin EC 81 MG tablet Take 81 mg by mouth daily.   Yes [provider]  atorvastatin (LIPITOR) 20 MG tablet Take 20 mg by mouth daily.   Yes [provider]  cyanocobalamin 1000 MCG tablet Take 1,000 mcg by mouth daily.   Yes [provider]  docusate sodium (COLACE) 100 MG capsule Take 100 mg by mouth 2 (two) times daily.   Yes [provider]  ferrous sulfate 325 (65 FE) MG tablet Take 325 mg by mouth daily.    Yes [provider]  Fluticasone-Salmeterol (ADVAIR) 250-50 MCG/DOSE AEPB Inhale 1 puff into the lungs 2 (two) times daily.   Yes [provider]  gabapentin (NEURONTIN) 300 MG capsule Take 600 mg by mouth 4 (four) times daily.  09/24/19  Yes [provider]  insulin aspart (NOVOLOG FLEXPEN) 100 UNIT/ML FlexPen Inject 5 Units into the skin 3 (three) times daily with meals.    Yes [provider]  insulin detemir (LEVEMIR) 100 UNIT/ML injection Inject 0.05 mLs (5 Units total) into the skin daily. 04/18/20  Yes Max Sane, MD  liraglutide (VICTOZA) 18 MG/3ML SOPN Inject 0.6 mg into the skin daily.  02/27/18  Yes [provider]  Melatonin 3 MG TBDP Take 1 tablet by mouth at bedtime.  01/09/20  Yes [provider]  metoprolol succinate (TOPROL-XL) 25 MG 24 hr tablet Take 12.5 mg by mouth daily.  01/09/20 01/08/21 Yes [provider]  Multiple Vitamin (MULTIVITAMIN WITH MINERALS) TABS tablet Take 1 tablet by mouth daily.   Yes [provider]  oxycodone (OXY-IR) 5 MG capsule Take 5 mg by mouth every 6 (six) hours as needed for pain.   Yes [provider]  pantoprazole (PROTONIX)  40 MG tablet Take by mouth. 01/09/20 01/08/21 Yes [provider]  polyethylene glycol powder (GLYCOLAX/MIRALAX) 17 GM/SCOOP powder Take 17 g by mouth daily.  01/09/20  Yes [provider]  potassium chloride SA (KLOR-CON) 20 MEQ tablet Take 1 tablet (20 mEq total) by  mouth daily. 04/18/20 05/18/20 Yes Max Sane, MD  sertraline (ZOLOFT) 100 MG tablet Take by mouth. 01/09/20 01/08/21 Yes [provider]  spironolactone (ALDACTONE) 25 MG tablet Take 1 tablet (25 mg total) by mouth daily. 04/18/20 05/18/20 Yes Max Sane, MD  torsemide (DEMADEX) 20 MG tablet Take 2 tablets (40 mg total) by mouth daily. 04/18/20 05/18/20 Yes Max Sane, MD  traMADol (ULTRAM) 50 MG tablet Take 50 mg by mouth every 6 (six) hours as needed for moderate pain.    Yes [provider]  zinc sulfate 220 (50 Zn) MG capsule Take 220 mg by mouth daily.  01/09/20 01/08/21 Yes [provider]  dextromethorphan-guaiFENesin (MUCINEX DM) 30-600 MG 12hr tablet Take 1 tablet by mouth 2 (two) times daily as needed for cough. Patient not taking: Reported on 04/25/2020 04/18/20   Max Sane, MD   No Known Allergies Review of Systems  Constitutional: Positive for fatigue.  HENT: Positive for sore throat.   Respiratory: Positive for shortness of breath.   Skin: Positive for wound.  All other systems reviewed and are negative.   Physical Exam Constitutional:      General: She is not in acute distress.    Appearance: She is ill-appearing.  Pulmonary:     Effort: Respiratory distress present.  Musculoskeletal:     Right lower leg: Edema present.     Left lower leg: Edema present.  Skin:    General: Skin is warm and dry.     Comments: Necrotic fingers  Neurological:     Mental Status: She is alert and oriented to person, place, and time.  Psychiatric:        Behavior: Behavior is cooperative.        Cognition and Memory: Cognition and memory normal.     Vital Signs: BP (!) 152/94   Pulse 73   Temp 98.3 F (36.8 C) (Oral)   Resp (!) 27   SpO2 98%  Pain Scale: 0-10   Pain Score: 8    SpO2: SpO2: 98 % O2 Device:SpO2: 98 % O2 Flow Rate: .O2 Flow Rate (L/min): 6 L/min  IO: Intake/output summary:   Intake/Output Summary (Last 24 hours) at 04/25/2020 1204 Last  data filed at 04/25/2020 0132 Gross per 24 hour  Intake 100 ml  Output --  Net 100 ml    LBM:   Baseline Weight:   Most recent weight:       Palliative Assessment/Data: PPS 30%     Time In: 1230 Time Out: 1340 Time Total: 70 minutes  Greater than 50%  of this time was spent counseling and coordinating care related to the above assessment and plan.  Signed by: Lin Landsman, NP   Please contact Palliative Medicine Team phone at [phone] for questions and concerns.  For individual provider: See Shea Evans

## 2020-04-25 NOTE — Progress Notes (Signed)
[facility] PROGRESS NOTE    Progress Note  Julie Bender  WUX:[id number] DOB: April 08, 1961 DOA: 05/05/2020 PCP: Juluis Pitch, MD     Brief Narrative:   Julie Bender is an 59 y.o. female past medical history significant for essential hypertension, type 2 diabetes mellitus, chronic respiratory failure on 2 L of oxygen obstructive sleep apnea AICD chronic diastolic heart failure hokum status post myomectomy, chronic kidney disease stage IIIb chronic atrial fibrillation on Eliquis who presents with complaints of shortness of breath.  Recently discharged in the hospital for acute decompensated heart failure treated with IV Lasix and discharged to skilled nursing facility.  At the skilled was requiring 15 L of oxygen EMS was called on arrival upon the ED her oxygen saturations improved gradually but she remained in 4 L SARS-CoV-2 PCR was positive she was started on IV Lasix.  Assessment/Plan:   Acute on chronic respiratory failure with hypoxemia secondary to pneumonia due to COVID-19: Patient is requiring 6 L of oxygen to keep oxygenation greater than 90%.  Her baseline oxygen requirements is to due to her COPD. Her inflammatory markers were elevated, she was started empirically on IV Solu-Medrol and remdesivir. The treatment plan and use of medications (Baricitinib) and known side effects were discussed with patient/family, they were clearly explained that there is no proven definitive treatment for COVID-19 infection, any medications used here are based on published clinical articles/anecdotal data which are not peer-reviewed or randomized control trials.  Complete risks and long-term side effects are unknown, however in the best clinical judgment they seem to be of some clinical benefit rather than medical risks.  Patient/family agree with the treatment plan and want to receive the given medications. Procalcitonin is low yield we will continue to hold antibiotics for now he has no  leukocytosis.  Acute on chronic diastolic heart failure: She was started on IV Lasix, I's and O's are poorly recorded. She was started on IV Lasix will increase dose, continue Aldactone she is definitely fluid overloaded on physical exam.  Chronic atrial fibrillation: With a chads Vasc greater than 2, status post AICD implant about a year ago. Rate controlled continue Eliquis.  Elevated LFTs: This is possibly due to Covid-19 versus gallbladder disease she denies any abdominal pain, her T bili is 2.5 alkaline phosphatase 156. As she has been repeatedly admitted, she is a morbidly obese female with limited mobility there is also concern about gallbladder disease. Check an abdominal ultrasound.  Dry gangrene over left 5,4 &3 and right index finger: Continue gabapentin, is currently asymptomatic.  Depression: Continue current home regimen.  Essential hypertension: Her blood pressure seems to be fairly controlled, I agree with continuing diuresis.  Chronic kidney disease stage IIIb: His creatinine appears to to be at baseline 1.2-1.6, continue oral Lasix.  Goals of care conversation: She has had multiple recurrent admissions for heart failure, as reading through the notes the patient has been seen by palliative care in the hospital and was transitioned from full code to DNR. If the patient decompensates will reconsult palliative care to address end of life.   DVT prophylaxis: lovenxo Family Communication:none Status is: Inpatient  Remains inpatient appropriate because:Hemodynamically unstable   Dispo: The patient is from: SNF              Anticipated d/c is to: SNF              Anticipated d/c date is: > 3 days  Patient currently is not medically stable to d/c. Unable to determine.  Code Status:     Code Status Orders  (From admission, onward)         Start     Ordered   04/25/20 0107  Do not attempt resuscitation (DNR)  Continuous       Question Answer  Comment  In the event of cardiac or respiratory ARREST Do not call a "code blue"   In the event of cardiac or respiratory ARREST Do not perform Intubation, CPR, defibrillation or ACLS   In the event of cardiac or respiratory ARREST Use medication by any route, position, wound care, and other measures to relive pain and suffering. May use oxygen, suction and manual treatment of airway obstruction as needed for comfort.      04/25/20 0110        Code Status History    Date Active Date Inactive Code Status Order ID Comments User Context   04/10/2020 1017 04/18/2020 2243 DNR [id number]  Jimmy Footman, NP Inpatient   03/30/2020 1807 04/10/2020 1017 Full Code [id number]  Ivor Costa, MD Inpatient   06/19/2019 1324 06/19/2019 1801 Full Code [id number]  Nelva Bush, MD Inpatient   06/02/2019 0010 06/09/2019 2026 Full Code [id number]  Lance Coon, MD Inpatient   05/19/2019 1159 05/26/2019 2135 Full Code [id number]  Hillary Bow, MD ED   02/23/2019 1918 02/25/2019 1621 Full Code [id number]  Lang Snow, NP ED   04/18/2018 0209 04/20/2018 1930 Full Code [id number]  Arta Silence, MD Inpatient   09/05/2016 0305 09/11/2016 1601 Full Code [id number]  Hugelmeyer, Ubaldo Glassing, DO Inpatient   Advance Care Planning Activity        IV Access:    Peripheral IV   Procedures and diagnostic studies:   DG Chest Port 1 View  Result Date: 04/17/2020 CLINICAL DATA:  Shortness of breath EXAM: PORTABLE CHEST 1 VIEW COMPARISON:  03/30/2020 FINDINGS: Cardiomegaly. Diffuse interstitial prominence. Patchy airspace disease throughout the right lung. Findings could reflect edema or infection. No effusions. No acute bony abnormality. IMPRESSION: Diffuse interstitial prominence with patchy right lung airspace disease. Findings could reflect asymmetric edema or infection. Electronically Signed   By: Rolm Baptise M.D.   On: 04/23/2020 20:42     Medical Consultants:    None.  Anti-Infectives:    none  Subjective:    Julie Bender she relates she does feel tired and fatigued, she denies denies any abdominal pain she does feel anorexic. She relates her breathing has not worsened.  Objective:    Vitals:   04/25/20 0400 04/25/20 0430 04/25/20 0500 04/25/20 0530  BP: (!) 141/74 (!) 141/78 (!) 145/84 122/70  Pulse: 73 (!) 45 73 73  Resp: (!) 25 (!) 25 (!) 26 (!) 21  Temp:      TempSrc:      SpO2: 97% 96% 97% 98%   SpO2: 98 % O2 Flow Rate (L/min): 6 L/min FiO2 (%): (!) 4 %   Intake/Output Summary (Last 24 hours) at 04/25/2020 0719 Last data filed at 04/25/2020 0132 Gross per 24 hour  Intake 100 ml  Output --  Net 100 ml   There were no vitals filed for this visit.  Exam: General exam: In no acute distress. Respiratory system: Good air movement and clear to auscultation. Cardiovascular system: S1 & S2 heard, RRR. Positive JVD Gastrointestinal system: Abdomen is nondistended, soft and nontender, Murphy sign negative. Extremities: The left fifth fourth and third digit are black and the right index  finger Skin: 3+ edema Psychiatry: Judgement and insight appear normal. Mood & affect appropriate.    Data Reviewed:    Labs: Basic Metabolic Panel: Recent Labs  Lab 05/02/2020 2056 04/25/20 0203  NA 136 136  K 4.8 4.8  CL 97* 99  CO2 24 23  GLUCOSE 218* 206*  BUN 26* 28*  CREATININE 1.67* 1.65*  CALCIUM 9.1 9.0   GFR Estimated Creatinine Clearance: 40.7 mL/min (A) (by C-G formula based on SCr of 1.65 mg/dL (H)). Liver Function Tests: Recent Labs  Lab 04/29/2020 2056 04/25/20 0203  AST 232* 230*  ALT 113* 110*  ALKPHOS 160* 154*  BILITOT 2.7* 2.5*  PROT 6.7 6.7  ALBUMIN 3.7 3.8   No results for input(s): LIPASE, AMYLASE in the last 168 hours. No results for input(s): AMMONIA in the last 168 hours. Coagulation profile No results for input(s): INR, PROTIME in the last 168 hours. COVID-19 Labs  Recent Labs    04/25/20 0203  DDIMER 7.99*   FERRITIN 501*  CRP 8.4*    Lab Results  Component Value Date   SARSCOV2NAA POSITIVE (A) 04/16/2020   SARSCOV2NAA NEGATIVE 04/18/2020   SARSCOV2NAA NEGATIVE 03/30/2020   [place] NEGATIVE 06/15/2019    CBC: Recent Labs  Lab 04/26/2020 2056 04/25/20 0203  WBC 6.6 6.1  NEUTROABS 5.6 4.9  HGB 7.8* 8.1*  HCT 25.6* 26.5*  MCV 81.0 83.1  PLT 137* 127*   Cardiac Enzymes: No results for input(s): CKTOTAL, CKMB, CKMBINDEX, TROPONINI in the last 168 hours. BNP (last 3 results) No results for input(s): PROBNP in the last 8760 hours. CBG: Recent Labs  Lab 04/18/20 0817 04/18/20 0928 04/18/20 1156 04/25/20 0219  GLUCAP 40* 87 112* 195*   D-Dimer: Recent Labs    04/25/20 0203  DDIMER 7.99*   Hgb A1c: No results for input(s): HGBA1C in the last 72 hours. Lipid Profile: No results for input(s): CHOL, HDL, LDLCALC, TRIG, CHOLHDL, LDLDIRECT in the last 72 hours. Thyroid function studies: No results for input(s): TSH, T4TOTAL, T3FREE, THYROIDAB in the last 72 hours.  Invalid input(s): FREET3 Anemia work up: Recent Labs    04/25/20 0203  FERRITIN 501*   Sepsis Labs: Recent Labs  Lab 04/17/2020 2056 04/25/20 0203  PROCALCITON  --  0.83  WBC 6.6 6.1   Microbiology Recent Results (from the past 240 hour(s))  Respiratory Panel by RT PCR (Flu A&B, Covid) - Nasopharyngeal Swab     Status: None   Collection Time: 04/18/20 12:35 PM   Specimen: Nasopharyngeal Swab  Result Value Ref Range Status   SARS Coronavirus 2 by RT PCR NEGATIVE NEGATIVE Final    Comment: (NOTE) SARS-CoV-2 target nucleic acids are NOT DETECTED.  The SARS-CoV-2 RNA is generally detectable in upper respiratoy specimens during the acute phase of infection. The lowest concentration of SARS-CoV-2 viral copies this assay can detect is 131 copies/mL. A negative result does not preclude SARS-Cov-2 infection and should not be used as the sole basis for treatment or other patient management decisions. A  negative result may occur with  improper specimen collection/handling, submission of specimen other than nasopharyngeal swab, presence of viral mutation(s) within the areas targeted by this assay, and inadequate number of viral copies (<131 copies/mL). A negative result must be combined with clinical observations, patient history, and epidemiological information. The expected result is Negative.  Fact Sheet for Patients:  PinkCheek.be  Fact Sheet for Healthcare Providers:  GravelBags.it  This test is no t yet approved or cleared by the [place]  FDA and  has been authorized for detection and/or diagnosis of SARS-CoV-2 by FDA under an Emergency Use Authorization (EUA). This EUA will remain  in effect (meaning this test can be used) for the duration of the COVID-19 declaration under Section 564(b)(1) of the Act, 21 U.S.C. section 360bbb-3(b)(1), unless the authorization is terminated or revoked sooner.     Influenza A by PCR NEGATIVE NEGATIVE Final   Influenza B by PCR NEGATIVE NEGATIVE Final    Comment: (NOTE) The Xpert Xpress SARS-CoV-2/FLU/RSV assay is intended as an aid in  the diagnosis of influenza from Nasopharyngeal swab specimens and  should not be used as a sole basis for treatment. Nasal washings and  aspirates are unacceptable for Xpert Xpress SARS-CoV-2/FLU/RSV  testing.  Fact Sheet for Patients: PinkCheek.be  Fact Sheet for Healthcare Providers: GravelBags.it  This test is not yet approved or cleared by the [place] FDA and  has been authorized for detection and/or diagnosis of SARS-CoV-2 by  FDA under an Emergency Use Authorization (EUA). This EUA will remain  in effect (meaning this test can be used) for the duration of the  Covid-19 declaration under Section 564(b)(1) of the Act, 21  U.S.C. section 360bbb-3(b)(1), unless the authorization  is  terminated or revoked. Performed at [facility], [address] [place], [place] [address]   SARS Coronavirus 2 by RT PCR (hospital order, performed in [facility] hospital lab) Nasopharyngeal Nasopharyngeal Swab     Status: Abnormal   Collection Time: 05/06/2020 10:10 PM   Specimen: Nasopharyngeal Swab  Result Value Ref Range Status   SARS Coronavirus 2 POSITIVE (A) NEGATIVE Final    Comment: RESULT CALLED TO, READ BACK BY AND VERIFIED WITH: [email or web address] @0023  04/25/20 EB (NOTE) SARS-CoV-2 target nucleic acids are DETECTED  SARS-CoV-2 RNA is generally detectable in upper respiratory specimens  during the acute phase of infection.  Positive results are indicative  of the presence of the identified virus, but do not rule out bacterial infection or co-infection with other pathogens not detected by the test.  Clinical correlation with patient history and  other diagnostic information is necessary to determine patient infection status.  The expected result is negative.  Fact Sheet for Patients:   StrictlyIdeas.no   Fact Sheet for Healthcare Providers:   BankingDealers.co.za    This test is not yet approved or cleared by the [place] FDA and  has been authorized for detection and/or diagnosis of SARS-CoV-2 by FDA under an Emergency Use Authorization (EUA).  This EUA will remain in effect (meaning this test can  be used) for the duration of  the COVID-19 declaration under Section 564(b)(1) of the Act, 21 U.S.C. section 360-bbb-3(b)(1), unless the authorization is terminated or revoked sooner.  Performed at [facility], [place] [address], [place], [place] [address]      Medications:   . amiodarone  200 mg Oral Daily  . apixaban  5 mg Oral BID  . vitamin C  500 mg Oral Daily  . aspirin EC  81 mg Oral Daily  . atorvastatin  20 mg Oral Daily  . famotidine  20 mg Oral Daily  . ferrous sulfate  325 mg Oral Q  breakfast  . furosemide  40 mg Intravenous BID  . gabapentin  600 mg Oral TID AC & HS  . insulin aspart  0-15 Units Subcutaneous TID WC  . insulin aspart  0-5 Units Subcutaneous QHS  . insulin aspart  3 Units Subcutaneous TID WC  .  insulin detemir  5 Units Subcutaneous Daily  . Ipratropium-Albuterol  1 puff Inhalation Q6H  . linagliptin  5 mg Oral Daily  . methylPREDNISolone (SOLU-MEDROL) injection  100 mg Intravenous BID   Followed by  . [START ON 04/28/2020] predniSONE  50 mg Oral Daily  . mometasone-formoterol  2 puff Inhalation BID  . pantoprazole  40 mg Oral Daily  . polyethylene glycol  17 g Oral Daily  . sertraline  100 mg Oral Daily  . spironolactone  25 mg Oral Daily  . cyanocobalamin  1,000 mcg Oral Daily  . zinc sulfate  220 mg Oral Daily   Continuous Infusions: . [START ON 04/26/2020] remdesivir 100 mg in NS 100 mL        LOS: 0 days   Julie Bender  [facility]  04/25/2020, 7:19 AM

## 2020-04-25 NOTE — ED Notes (Signed)
Patient placed on 15L humidified HFNC, SpO2 currently 100%. Meal tray provided, NT at bedside to assist with feeding if needed.

## 2020-04-25 NOTE — ED Notes (Addendum)
Pt refused dinner tray and only wanted her ensure. Pt has drank all of her ensure and ate the piece of cake that was left from lunch.

## 2020-04-25 NOTE — ED Notes (Addendum)
Per MD, transitioning patient to humidified high flow nasal cannula. RN to wean oxygen as tolerated. RT contacted, will transition to [place] once salter arrives. Will monitor for recurrent nosebleeds. Pt output 500 mL following lasix admin. Verbal order given for 40 mg lasix. Orders for chloraseptic throat spray and nasal saline spray requested by RN, placed by MD.

## 2020-04-26 LAB — CBC WITH DIFFERENTIAL/PLATELET
Abs Immature Granulocytes: 0.03 10*3/uL (ref 0.00–0.07)
Basophils Absolute: 0 10*3/uL (ref 0.0–0.1)
Basophils Relative: 0 %
Eosinophils Absolute: 0 10*3/uL (ref 0.0–0.5)
Eosinophils Relative: 0 %
HCT: 26.6 % — ABNORMAL LOW (ref 36.0–46.0)
Hemoglobin: 8.4 g/dL — ABNORMAL LOW (ref 12.0–15.0)
Immature Granulocytes: 1 %
Lymphocytes Relative: 5 %
Lymphs Abs: 0.2 10*3/uL — ABNORMAL LOW (ref 0.7–4.0)
MCH: 25.7 pg — ABNORMAL LOW (ref 26.0–34.0)
MCHC: 31.6 g/dL (ref 30.0–36.0)
MCV: 81.3 fL (ref 80.0–100.0)
Monocytes Absolute: 0.4 10*3/uL (ref 0.1–1.0)
Monocytes Relative: 9 %
Neutro Abs: 3.9 10*3/uL (ref 1.7–7.7)
Neutrophils Relative %: 85 %
Platelets: 158 10*3/uL (ref 150–400)
RBC: 3.27 MIL/uL — ABNORMAL LOW (ref 3.87–5.11)
RDW: 21.7 % — ABNORMAL HIGH (ref 11.5–15.5)
WBC: 4.5 10*3/uL (ref 4.0–10.5)
nRBC: 1.8 % — ABNORMAL HIGH (ref 0.0–0.2)

## 2020-04-26 LAB — COMPREHENSIVE METABOLIC PANEL
ALT: 90 U/L — ABNORMAL HIGH (ref 0–44)
AST: 157 U/L — ABNORMAL HIGH (ref 15–41)
Albumin: 3.8 g/dL (ref 3.5–5.0)
Alkaline Phosphatase: 150 U/L — ABNORMAL HIGH (ref 38–126)
Anion gap: 17 — ABNORMAL HIGH (ref 5–15)
BUN: 46 mg/dL — ABNORMAL HIGH (ref 8–23)
CO2: 22 mmol/L (ref 22–32)
Calcium: 9.3 mg/dL (ref 8.9–10.3)
Chloride: 96 mmol/L — ABNORMAL LOW (ref 98–111)
Creatinine, Ser: 2.21 mg/dL — ABNORMAL HIGH (ref 0.44–1.00)
GFR calc Af Amer: 27 mL/min — ABNORMAL LOW (ref >60)
GFR calc non Af Amer: 23 mL/min — ABNORMAL LOW (ref >60)
Glucose, Bld: 320 mg/dL — ABNORMAL HIGH (ref 70–99)
Potassium: 4.8 mmol/L (ref 3.5–5.1)
Sodium: 135 mmol/L (ref 135–145)
Total Bilirubin: 2.3 mg/dL — ABNORMAL HIGH (ref 0.3–1.2)
Total Protein: 7.2 g/dL (ref 6.5–8.1)

## 2020-04-26 LAB — HEMOGLOBIN A1C
Hgb A1c MFr Bld: 7.4 % — ABNORMAL HIGH (ref 4.8–5.6)
Mean Plasma Glucose: 165.68 mg/dL

## 2020-04-26 LAB — FERRITIN: Ferritin: 1146 ng/mL — ABNORMAL HIGH (ref 11–307)

## 2020-04-26 LAB — CBG MONITORING, ED
Glucose-Capillary: 313 mg/dL — ABNORMAL HIGH (ref 70–99)
Glucose-Capillary: 250 mg/dL — ABNORMAL HIGH (ref 70–99)
Glucose-Capillary: 214 mg/dL — ABNORMAL HIGH (ref 70–99)

## 2020-04-26 LAB — GLUCOSE, CAPILLARY: Glucose-Capillary: 209 mg/dL — ABNORMAL HIGH (ref 70–99)

## 2020-04-26 LAB — PROCALCITONIN: Procalcitonin: 1.12 ng/mL

## 2020-04-26 LAB — MAGNESIUM: Magnesium: 1.7 mg/dL (ref 1.7–2.4)

## 2020-04-26 LAB — C-REACTIVE PROTEIN: CRP: 7.1 mg/dL — ABNORMAL HIGH (ref <1.0)

## 2020-04-26 LAB — ABO/RH: ABO/RH(D): A POS

## 2020-04-26 MED ORDER — INSULIN ASPART 100 UNIT/ML ~~LOC~~ SOLN
0.0000 [IU] | Freq: Every day | SUBCUTANEOUS | Status: DC
  Administered 2020-04-26: 2 [IU] via SUBCUTANEOUS

## 2020-04-26 MED ORDER — INSULIN ASPART 100 UNIT/ML ~~LOC~~ SOLN
4.0000 [IU] | Freq: Three times a day (TID) | SUBCUTANEOUS | Status: DC
  Administered 2020-04-26 – 2020-04-27 (×3): 4 [IU] via SUBCUTANEOUS

## 2020-04-26 MED ORDER — FUROSEMIDE 10 MG/ML IJ SOLN
80.0000 mg | Freq: Three times a day (TID) | INTRAMUSCULAR | Status: DC
  Administered 2020-04-26 – 2020-04-27 (×3): 80 mg via INTRAVENOUS
  Filled 2020-04-26 (×3): qty 8

## 2020-04-26 MED ORDER — INSULIN ASPART 100 UNIT/ML ~~LOC~~ SOLN
0.0000 [IU] | Freq: Three times a day (TID) | SUBCUTANEOUS | Status: DC
  Administered 2020-04-26: 5 [IU] via SUBCUTANEOUS
  Administered 2020-04-26: 11 [IU] via SUBCUTANEOUS

## 2020-04-26 MED ORDER — INSULIN DETEMIR 100 UNIT/ML ~~LOC~~ SOLN
20.0000 [IU] | Freq: Two times a day (BID) | SUBCUTANEOUS | Status: DC
  Administered 2020-04-26 – 2020-04-30 (×8): 20 [IU] via SUBCUTANEOUS
  Filled 2020-04-26 (×12): qty 0.2

## 2020-04-26 NOTE — ED Notes (Signed)
Pt had large bowel movement, pt cleaned, linen changed and repositioned in bed. Pt tolerated well. Pt's mepilex removed due to large amount of stool present on dressing. Pt has skin breakdown in sacral area. Has pitting edema in left arm, bilateral legs.

## 2020-04-26 NOTE — ED Notes (Signed)
Pt incontinent of urine pt cleaned and bed sheets changed.

## 2020-04-26 NOTE — Progress Notes (Addendum)
[facility] PROGRESS NOTE    Progress Note  Julie Bender  FIE:[id number] DOB: 11/17/1960 DOA: 05/14/2020 PCP: Juluis Pitch, MD     Brief Narrative:   Julie Bender is an 59 y.o. female past medical history significant for essential hypertension, type 2 diabetes mellitus, chronic respiratory failure on 2 L of oxygen obstructive sleep apnea AICD chronic diastolic heart failure hokum status post myomectomy, chronic kidney disease stage IIIb chronic atrial fibrillation on Eliquis who presents with complaints of shortness of breath.  Recently discharged in the hospital for acute decompensated heart failure treated with IV Lasix and discharged to skilled nursing facility.  At the skilled was requiring 15 L of oxygen EMS was called on arrival upon the ED her oxygen saturations improved gradually but she remained in 4 L SARS-CoV-2 PCR was positive she was started on IV Lasix.  Assessment/Plan:   Acute on chronic respiratory failure with hypoxemia secondary to pneumonia due to COVID-19: Patient oxygen requirements have worsened she is now requiring 15 L of high flow nasal cannula to keep saturations greater than 90%. Anything above 88% on her is good. She does have a baseline requirement of 2 L due to her COPD. She was started empirically on IV steroids and IV remdesivir. Inflammatory markers have not been drawn. Her oxygen requirements have worsened, I will go ahead and start her on baricitinib.  We had this conversation yesterday and she would like to receive it. The treatment plan and use of medications and known side effects were discussed with patient/family, they were clearly explained that there is no proven definitive treatment for COVID-19 infection, any medications used here are based on published clinical articles/anecdotal data which are not peer-reviewed or randomized control trials.  Complete risks and long-term side effects are unknown, however in the best clinical judgment  they seem to be of some clinical benefit rather than medical risks.  Patient/family agree with the treatment plan and want to receive the given medications.  Acute on chronic diastolic heart failure: She was started on IV Lasix I's and O's have been poorly recorded. Continue Aldactone she appears to be fluid overloaded on physical exam. Basic metabolic panel is pending this morning. Continues to be fluid overloaded on physical exam, lower extremity edema and positive JVD.  Chronic atrial fibrillation: With a chads Vasc greater than 2, status post AICD implant about a year ago. Rate controlled continue Eliquis.  Elevated LFTs: Likely due to Covid-19. Abdominal ultrasound she has no gallbladder no signs of cholecystitis.  Dry gangrene over left 5,4 &3 and right index finger: Continue gabapentin, is currently asymptomatic.  Depression: Continue current home regimen.  Essential hypertension: Her blood pressure seems to be fairly controlled, I agree with continuing diuresis.  Chronic kidney disease stage IIIb: His creatinine appears to to be at baseline 1.2-1.6, continue oral Lasix.  Goals of care conversation: She has had multiple recurrent admissions for heart failure, as reading through the notes the patient has been seen by palliative care in the hospital and was transitioned from full code to DNR. If the patient decompensates will reconsult palliative care to address end of life.   DVT prophylaxis: lovenox Family Communication:none Status is: Inpatient  Remains inpatient appropriate because:Hemodynamically unstable  Dispo: The patient is from: SNF              Anticipated d/c is to: SNF              Anticipated d/c date is: > 3 days  Patient currently is not medically stable to d/c. Unable to determine.  Code Status:     Code Status Orders  (From admission, onward)         Start     Ordered   04/25/20 0107  Do not attempt resuscitation (DNR)   Continuous       Question Answer Comment  In the event of cardiac or respiratory ARREST Do not call a "code blue"   In the event of cardiac or respiratory ARREST Do not perform Intubation, CPR, defibrillation or ACLS   In the event of cardiac or respiratory ARREST Use medication by any route, position, wound care, and other measures to relive pain and suffering. May use oxygen, suction and manual treatment of airway obstruction as needed for comfort.      04/25/20 0110        Code Status History    Date Active Date Inactive Code Status Order ID Comments User Context   04/10/2020 1017 04/18/2020 2243 DNR [id number]  Jimmy Footman, NP Inpatient   03/30/2020 1807 04/10/2020 1017 Full Code [id number]  Ivor Costa, MD Inpatient   06/19/2019 1324 06/19/2019 1801 Full Code [id number]  Nelva Bush, MD Inpatient   06/02/2019 0010 06/09/2019 2026 Full Code [id number]  Lance Coon, MD Inpatient   05/19/2019 1159 05/26/2019 2135 Full Code [id number]  Hillary Bow, MD ED   02/23/2019 1918 02/25/2019 1621 Full Code [id number]  Lang Snow, NP ED   04/18/2018 0209 04/20/2018 1930 Full Code [id number]  Arta Silence, MD Inpatient   09/05/2016 0305 09/11/2016 1601 Full Code [id number]  Hugelmeyer, Ubaldo Glassing, DO Inpatient   Advance Care Planning Activity        IV Access:    Peripheral IV   Procedures and diagnostic studies:   US Abdomen Complete  Result Date: 04/25/2020 CLINICAL DATA:  Abnormal LFTs EXAM: ABDOMEN ULTRASOUND COMPLETE COMPARISON:  None. FINDINGS: Gallbladder: Cholecystectomy Common bile duct: Diameter: 6 mm, within normal limits Liver: No focal lesion identified. Within normal limits in parenchymal echogenicity. Portal vein is patent on color Doppler imaging with normal direction of blood flow towards the liver. IVC: No abnormality visualized. Pancreas: Visualized portion unremarkable. Spleen: Suboptimal visualization.  Likely within normal limits. Right Kidney: Length:  11.7 cm. Echogenicity likely within normal limits. No mass or hydronephrosis visualized. Left Kidney: Length: Suboptimal visualization. 11.2 cm. Echogenicity within normal limits. No mass or hydronephrosis visualized. Abdominal aorta: No aneurysm visualized. Other findings: Trace ascites. Technically suboptimal due to body habitus and rapid breathing. IMPRESSION: Trace ascites.  Otherwise unremarkable. Electronically Signed   By: Macy Mis M.D.   On: 04/25/2020 11:09   DG Chest Port 1 View  Result Date: 05/03/2020 CLINICAL DATA:  Shortness of breath EXAM: PORTABLE CHEST 1 VIEW COMPARISON:  03/30/2020 FINDINGS: Cardiomegaly. Diffuse interstitial prominence. Patchy airspace disease throughout the right lung. Findings could reflect edema or infection. No effusions. No acute bony abnormality. IMPRESSION: Diffuse interstitial prominence with patchy right lung airspace disease. Findings could reflect asymmetric edema or infection. Electronically Signed   By: Rolm Baptise M.D.   On: 04/20/2020 20:42     Medical Consultants:    None.  Anti-Infectives:   none  Subjective:    Lova Urbieta relates her breathing is worse.  Objective:    Vitals:   04/25/20 2018 04/25/20 2100 04/25/20 2200 04/25/20 2300  BP:  (!) 121/56 126/70 (!) 114/58  Pulse:  74  73  Resp:  (!) 24 (!) 21 (!) 24  Temp: 98.6  F (37 C)     TempSrc: Oral     SpO2:  100% 100% 100%   SpO2: 100 % O2 Flow Rate (L/min): 15 L/min FiO2 (%): (!) 4 %   Intake/Output Summary (Last 24 hours) at 04/26/2020 0657 Last data filed at 04/25/2020 1837 Gross per 24 hour  Intake 8 ml  Output 500 ml  Net -492 ml   There were no vitals filed for this visit.  Exam: General exam: In no acute distress. Respiratory system: Good air movement and clear to auscultation. Cardiovascular system: S1 & S2 heard, RRR.  Positive JVD Gastrointestinal system: Abdomen is nondistended, soft and nontender.  Extremities: 3+ edema Skin: No rashes,  lesions or ulcers Psychiatry: Judgement and insight appear normal. Mood & affect appropriate.   Data Reviewed:    Labs: Basic Metabolic Panel: Recent Labs  Lab 04/20/2020 2056 04/25/20 0203  NA 136 136  K 4.8 4.8  CL 97* 99  CO2 24 23  GLUCOSE 218* 206*  BUN 26* 28*  CREATININE 1.67* 1.65*  CALCIUM 9.1 9.0   GFR Estimated Creatinine Clearance: 40.7 mL/min (A) (by C-G formula based on SCr of 1.65 mg/dL (H)). Liver Function Tests: Recent Labs  Lab 05/01/2020 2056 04/25/20 0203  AST 232* 230*  ALT 113* 110*  ALKPHOS 160* 154*  BILITOT 2.7* 2.5*  PROT 6.7 6.7  ALBUMIN 3.7 3.8   No results for input(s): LIPASE, AMYLASE in the last 168 hours. No results for input(s): AMMONIA in the last 168 hours. Coagulation profile No results for input(s): INR, PROTIME in the last 168 hours. COVID-19 Labs  Recent Labs    04/25/20 0203  DDIMER 7.99*  FERRITIN 501*  CRP 8.4*    Lab Results  Component Value Date   SARSCOV2NAA POSITIVE (A) 05/15/2020   SARSCOV2NAA NEGATIVE 04/18/2020   SARSCOV2NAA NEGATIVE 03/30/2020   [place] NEGATIVE 06/15/2019    CBC: Recent Labs  Lab 04/27/2020 2056 04/25/20 0203  WBC 6.6 6.1  NEUTROABS 5.6 4.9  HGB 7.8* 8.1*  HCT 25.6* 26.5*  MCV 81.0 83.1  PLT 137* 127*   Cardiac Enzymes: No results for input(s): CKTOTAL, CKMB, CKMBINDEX, TROPONINI in the last 168 hours. BNP (last 3 results) No results for input(s): PROBNP in the last 8760 hours. CBG: Recent Labs  Lab 04/25/20 0219 04/25/20 0818 04/25/20 1157 04/25/20 1826 04/25/20 2331  GLUCAP 195* 229* 216* 244* 318*   D-Dimer: Recent Labs    04/25/20 0203  DDIMER 7.99*   Hgb A1c: No results for input(s): HGBA1C in the last 72 hours. Lipid Profile: No results for input(s): CHOL, HDL, LDLCALC, TRIG, CHOLHDL, LDLDIRECT in the last 72 hours. Thyroid function studies: No results for input(s): TSH, T4TOTAL, T3FREE, THYROIDAB in the last 72 hours.  Invalid input(s):  FREET3 Anemia work up: Recent Labs    04/25/20 0203  FERRITIN 501*   Sepsis Labs: Recent Labs  Lab 05/03/2020 2056 04/25/20 0203 04/25/20 0803  PROCALCITON  --  0.83 0.99  WBC 6.6 6.1  --    Microbiology Recent Results (from the past 240 hour(s))  Respiratory Panel by RT PCR (Flu A&B, Covid) - Nasopharyngeal Swab     Status: None   Collection Time: 04/18/20 12:35 PM   Specimen: Nasopharyngeal Swab  Result Value Ref Range Status   SARS Coronavirus 2 by RT PCR NEGATIVE NEGATIVE Final    Comment: (NOTE) SARS-CoV-2 target nucleic acids are NOT DETECTED.  The SARS-CoV-2 RNA is generally detectable in upper respiratoy specimens during the acute  phase of infection. The lowest concentration of SARS-CoV-2 viral copies this assay can detect is 131 copies/mL. A negative result does not preclude SARS-Cov-2 infection and should not be used as the sole basis for treatment or other patient management decisions. A negative result may occur with  improper specimen collection/handling, submission of specimen other than nasopharyngeal swab, presence of viral mutation(s) within the areas targeted by this assay, and inadequate number of viral copies (<131 copies/mL). A negative result must be combined with clinical observations, patient history, and epidemiological information. The expected result is Negative.  Fact Sheet for Patients:  PinkCheek.be  Fact Sheet for Healthcare Providers:  GravelBags.it  This test is no t yet approved or cleared by the [place] FDA and  has been authorized for detection and/or diagnosis of SARS-CoV-2 by FDA under an Emergency Use Authorization (EUA). This EUA will remain  in effect (meaning this test can be used) for the duration of the COVID-19 declaration under Section 564(b)(1) of the Act, 21 U.S.C. section 360bbb-3(b)(1), unless the authorization is terminated or revoked sooner.      Influenza A by PCR NEGATIVE NEGATIVE Final   Influenza B by PCR NEGATIVE NEGATIVE Final    Comment: (NOTE) The Xpert Xpress SARS-CoV-2/FLU/RSV assay is intended as an aid in  the diagnosis of influenza from Nasopharyngeal swab specimens and  should not be used as a sole basis for treatment. Nasal washings and  aspirates are unacceptable for Xpert Xpress SARS-CoV-2/FLU/RSV  testing.  Fact Sheet for Patients: PinkCheek.be  Fact Sheet for Healthcare Providers: GravelBags.it  This test is not yet approved or cleared by the [place] FDA and  has been authorized for detection and/or diagnosis of SARS-CoV-2 by  FDA under an Emergency Use Authorization (EUA). This EUA will remain  in effect (meaning this test can be used) for the duration of the  Covid-19 declaration under Section 564(b)(1) of the Act, 21  U.S.C. section 360bbb-3(b)(1), unless the authorization is  terminated or revoked. Performed at [facility], [address] [place], [place] [address]   SARS Coronavirus 2 by RT PCR (hospital order, performed in [facility] hospital lab) Nasopharyngeal Nasopharyngeal Swab     Status: Abnormal   Collection Time: 05/08/2020 10:10 PM   Specimen: Nasopharyngeal Swab  Result Value Ref Range Status   SARS Coronavirus 2 POSITIVE (A) NEGATIVE Final    Comment: RESULT CALLED TO, READ BACK BY AND VERIFIED WITH: [email or web address] @0023  04/25/20 EB (NOTE) SARS-CoV-2 target nucleic acids are DETECTED  SARS-CoV-2 RNA is generally detectable in upper respiratory specimens  during the acute phase of infection.  Positive results are indicative  of the presence of the identified virus, but do not rule out bacterial infection or co-infection with other pathogens not detected by the test.  Clinical correlation with patient history and  other diagnostic information is necessary to determine patient infection status.  The expected result is  negative.  Fact Sheet for Patients:   StrictlyIdeas.no   Fact Sheet for Healthcare Providers:   BankingDealers.co.za    This test is not yet approved or cleared by the [place] FDA and  has been authorized for detection and/or diagnosis of SARS-CoV-2 by FDA under an Emergency Use Authorization (EUA).  This EUA will remain in effect (meaning this test can  be used) for the duration of  the COVID-19 declaration under Section 564(b)(1) of the Act, 21 U.S.C. section 360-bbb-3(b)(1), unless the authorization is terminated or revoked sooner.  Performed at [facility]  Lab, [address], [place], [place] [address]      Medications:   . amiodarone  200 mg Oral Daily  . apixaban  5 mg Oral BID  . vitamin C  500 mg Oral Daily  . aspirin EC  81 mg Oral Daily  . atorvastatin  20 mg Oral Daily  . famotidine  20 mg Oral Daily  . feeding supplement (ENSURE ENLIVE)  237 mL Oral TID BM  . ferrous sulfate  325 mg Oral Q breakfast  . furosemide  80 mg Intravenous BID  . gabapentin  600 mg Oral TID AC & HS  . insulin aspart  0-15 Units Subcutaneous TID WC  . insulin aspart  0-5 Units Subcutaneous QHS  . insulin aspart  3 Units Subcutaneous TID WC  . insulin detemir  5 Units Subcutaneous Daily  . Ipratropium-Albuterol  1 puff Inhalation Q6H  . linagliptin  5 mg Oral Daily  . methylPREDNISolone (SOLU-MEDROL) injection  100 mg Intravenous BID   Followed by  . [START ON 04/28/2020] predniSONE  50 mg Oral Daily  . mometasone-formoterol  2 puff Inhalation BID  . pantoprazole  40 mg Oral Daily  . polyethylene glycol  17 g Oral Daily  . sertraline  100 mg Oral Daily  . spironolactone  25 mg Oral Daily  . cyanocobalamin  1,000 mcg Oral Daily  . zinc sulfate  220 mg Oral Daily   Continuous Infusions: . remdesivir 100 mg in NS 100 mL        LOS: 1 day   Charlynne Cousins  [facility]  04/26/2020, 6:57 AM

## 2020-04-26 NOTE — ED Notes (Addendum)
Called into room for nosebleed, small amounts of blood noted to L nare that subsided with pressure.

## 2020-04-26 NOTE — TOC Initial Note (Addendum)
Transition of Care [facility]) - Initial/Assessment Note    Patient Details  Name: Julie Bender MRN: [id number] Date of Birth: [birth year]  Transition of [facility]) CM/SW Contact:    Verdell Carmine, RN Phone Number: 04/26/2020, 11:45 AM  Clinical Narrative:                  Patient arrived from SNF with [facility]. Positive COVID.Multiple co-morbidities, wears oxygen at 3L normally.   Patient is a DNR, supplemental oxygen being used. Recommend palliative services be involved in plan moving forward. Fluctuating orientation, If patient survives COVID will go back to [facility] care, as she previously lived alone. CM and CSW will follow for needs. Due to positive COVID, will need to be placed in facility that accepts COVID patients.   Expected Discharge Plan: [place]     Patient Goals and CMS Choice        Expected Discharge Plan and Services Expected Discharge Plan: [place] In-house Referral: Clinical Social Work Discharge Planning Services: CM Consult Post Acute Care Choice: [place] Living arrangements for the past 2 months: [place]                                      Prior Living Arrangements/Services Living arrangements for the past 2 months: [place]   Patient language and need for interpreter reviewed:: Yes        Need for Family Participation in Patient Care: Yes (Comment) Care giver support system in place?: Yes (comment)   Criminal Activity/Legal Involvement Pertinent to Current Situation/Hospitalization: No - Comment as needed  Activities of Daily Living      Permission Sought/Granted                  Emotional Assessment Appearance:: Appears older than stated age     Orientation: : Fluctuating Orientation (Suspected and/or reported Sundowners) Alcohol / Substance Use: Not Applicable Psych Involvement: No (comment)  Admission diagnosis:  Acute on chronic  respiratory failure with hypoxemia ([place]) [J96.21] Patient Active Problem List   Diagnosis Date Noted  . Anemia due to chronic kidney disease 04/25/2020  . Acute kidney injury superimposed on chronic kidney disease IIIb ([place]) 04/25/2020  . Thrombocytopenia ([place]) 04/25/2020  . Elevated LFTs 04/25/2020  . Pneumonia due to COVID-19 virus 04/25/2020  . Palliative care by specialist   . Goals of care, counseling/discussion   . DNR (do not resuscitate)   . DNI (do not intubate)   . Encounter for hospice care discussion   . Generalized weakness   . Acute sore throat   . Atrial fibrillation, chronic ([place]) 03/31/2020  . Acute on chronic diastolic CHF (congestive heart failure) ([place]) 03/30/2020  . CKD (chronic kidney disease), stage IIIa 03/30/2020  . COPD (chronic obstructive pulmonary disease) ([place])   . Hypertension   . Type II diabetes mellitus with renal manifestations ([place])   . ESRD (end stage renal disease) ([place])   . Acute on chronic respiratory failure with hypoxia ([place])   . Obstructive sleep apnea   . Morbid obesity ([place])   . Chronic heart failure with preserved ejection fraction ([place])   . End stage renal disease on dialysis ([place])   . Chronic obstructive asthma ([place])   . SOB (shortness of breath) 05/05/2018  . Elevated troponin 05/10/2017  . Depression 01/30/2014   PCP:  Juluis Pitch, MD Pharmacy:  [id number] - [place], [place] - [place] [address] [place] [address] Phone: [phone] Fax: [place] [phone] - [place], [place] [address] [place] [address] Phone: [phone] Fax: [phone]     Social Determinants of Health (SDOH) Interventions    Readmission Risk Interventions Readmission Risk Prevention Plan 06/02/2019 04/20/2018  Transportation Screening Complete Complete  PCP or Specialist Appt  within 5-7 Days - Complete  Home Care Screening - Complete  Medication Review (RN CM) - Complete  Palliative Care Screening Not Applicable -  Medication Review (RN Care Manager) Complete -  Some recent data might be hidden

## 2020-04-27 ENCOUNTER — Inpatient Hospital Stay (HOSPITAL_COMMUNITY): Payer: Medicare Other

## 2020-04-27 LAB — COMPREHENSIVE METABOLIC PANEL
ALT: 81 U/L — ABNORMAL HIGH (ref 0–44)
AST: 125 U/L — ABNORMAL HIGH (ref 15–41)
Albumin: 3.7 g/dL (ref 3.5–5.0)
Alkaline Phosphatase: 145 U/L — ABNORMAL HIGH (ref 38–126)
Anion gap: 13 (ref 5–15)
BUN: 50 mg/dL — ABNORMAL HIGH (ref 8–23)
CO2: 25 mmol/L (ref 22–32)
Calcium: 9.6 mg/dL (ref 8.9–10.3)
Chloride: 98 mmol/L (ref 98–111)
Creatinine, Ser: 2.34 mg/dL — ABNORMAL HIGH (ref 0.44–1.00)
GFR calc Af Amer: 25 mL/min — ABNORMAL LOW (ref >60)
GFR calc non Af Amer: 22 mL/min — ABNORMAL LOW (ref >60)
Glucose, Bld: 142 mg/dL — ABNORMAL HIGH (ref 70–99)
Potassium: 4.3 mmol/L (ref 3.5–5.1)
Sodium: 136 mmol/L (ref 135–145)
Total Bilirubin: 2.2 mg/dL — ABNORMAL HIGH (ref 0.3–1.2)
Total Protein: 7.1 g/dL (ref 6.5–8.1)

## 2020-04-27 LAB — GLUCOSE, CAPILLARY
Glucose-Capillary: 114 mg/dL — ABNORMAL HIGH (ref 70–99)
Glucose-Capillary: 82 mg/dL (ref 70–99)
Glucose-Capillary: 78 mg/dL (ref 70–99)
Glucose-Capillary: 154 mg/dL — ABNORMAL HIGH (ref 70–99)

## 2020-04-27 LAB — URINALYSIS, ROUTINE W REFLEX MICROSCOPIC
Bilirubin Urine: NEGATIVE
Glucose, UA: NEGATIVE mg/dL
Ketones, ur: NEGATIVE mg/dL
Leukocytes,Ua: NEGATIVE
Nitrite: NEGATIVE
Protein, ur: 100 mg/dL — AB
Specific Gravity, Urine: 1.012 (ref 1.005–1.030)
pH: 5 (ref 5.0–8.0)

## 2020-04-27 LAB — CBC WITH DIFFERENTIAL/PLATELET
Abs Immature Granulocytes: 0.04 10*3/uL (ref 0.00–0.07)
Basophils Absolute: 0 10*3/uL (ref 0.0–0.1)
Basophils Relative: 0 %
Eosinophils Absolute: 0 10*3/uL (ref 0.0–0.5)
Eosinophils Relative: 0 %
HCT: 25.3 % — ABNORMAL LOW (ref 36.0–46.0)
Hemoglobin: 8 g/dL — ABNORMAL LOW (ref 12.0–15.0)
Immature Granulocytes: 1 %
Lymphocytes Relative: 3 %
Lymphs Abs: 0.3 10*3/uL — ABNORMAL LOW (ref 0.7–4.0)
MCH: 24.3 pg — ABNORMAL LOW (ref 26.0–34.0)
MCHC: 31.6 g/dL (ref 30.0–36.0)
MCV: 76.9 fL — ABNORMAL LOW (ref 80.0–100.0)
Monocytes Absolute: 0.7 10*3/uL (ref 0.1–1.0)
Monocytes Relative: 8 %
Neutro Abs: 7 10*3/uL (ref 1.7–7.7)
Neutrophils Relative %: 88 %
Platelets: 176 10*3/uL (ref 150–400)
RBC: 3.29 MIL/uL — ABNORMAL LOW (ref 3.87–5.11)
RDW: 21.4 % — ABNORMAL HIGH (ref 11.5–15.5)
WBC: 8 10*3/uL (ref 4.0–10.5)
nRBC: 1.5 % — ABNORMAL HIGH (ref 0.0–0.2)

## 2020-04-27 LAB — FERRITIN: Ferritin: 1258 ng/mL — ABNORMAL HIGH (ref 11–307)

## 2020-04-27 LAB — MAGNESIUM: Magnesium: 1.7 mg/dL (ref 1.7–2.4)

## 2020-04-27 LAB — C-REACTIVE PROTEIN: CRP: 5.3 mg/dL — ABNORMAL HIGH (ref <1.0)

## 2020-04-27 MED ORDER — GABAPENTIN 300 MG PO CAPS
300.0000 mg | ORAL_CAPSULE | Freq: Three times a day (TID) | ORAL | Status: DC
  Administered 2020-04-27 (×2): 300 mg via ORAL
  Filled 2020-04-27 (×2): qty 1

## 2020-04-27 NOTE — Progress Notes (Addendum)
[facility] PROGRESS NOTE    Progress Note  Julie Bender  FKC:[id number] DOB: Jul 27, 1961 DOA: 05/04/2020 PCP: Juluis Pitch, MD     Brief Narrative:   Julie Bender is an 59 y.o. female past medical history significant for essential hypertension, type 2 diabetes mellitus, chronic respiratory failure on 2 L of oxygen obstructive sleep apnea AICD chronic diastolic heart failure hokum status post myomectomy, chronic kidney disease stage IIIb chronic atrial fibrillation on Eliquis who presents with complaints of shortness of breath.  Recently discharged in the hospital for acute decompensated heart failure treated with IV Lasix and discharged to skilled nursing facility.  At the skilled was requiring 15 L of oxygen EMS was called on arrival upon the ED her oxygen saturations improved gradually but she remained in 4 L SARS-CoV-2 PCR was positive she was started on IV Lasix.  Assessment/Plan:   Acute on chronic respiratory failure with hypoxemia secondary to pneumonia due to COVID-19: Her oxygen requirements have improved she is currently on 5 L of nasal cannula down from 15. Her baseline oxygen requirement is 2 L. Continue IV remdesivir and steroids.  Inflammatory markers are improved. We will consult PT OT. Her procal is rising. Will check CXR and U/A.  Acute on chronic diastolic heart failure: I's and O's have been poorly recorded, she still appears fluid overloaded on physical exam. Hold IV Lasix and Aldactone, there has been a significant rise in her creatinine. Pressure is holding steady please apply TED hose. Please continue strict I's and O's and daily weights.  Chronic atrial fibrillation: With a chads Vasc greater than 2, status post AICD implant about a year ago. Rate controlled continue Eliquis.  Elevated LFTs: Likely due to Covid-19. LFTs are improving with conservative management continue current regimen.  Dry gangrene over left 5,4 &3 and right index  finger: Continue gabapentin, is currently asymptomatic.  Depression: Continue current home regimen.  Essential hypertension: Her blood pressure seems to be fairly controlled, I agree with continuing diuresis.  Chronic kidney disease stage IIIb: His creatinine appears to to be at baseline 1.2-1.6, continue oral Lasix.  Goals of care conversation: She has had multiple recurrent admissions for heart failure, as reading through the notes the patient has been seen by palliative care in the hospital and was transitioned from full code to DNR. If the patient decompensates will reconsult palliative care to address end of life.   DVT prophylaxis: lovenox Family Communication:none Status is: Inpatient  Remains inpatient appropriate because:Hemodynamically unstable  Dispo: The patient is from: SNF              Anticipated d/c is to: SNF              Anticipated d/c date is: > 3 days              Patient currently is not medically stable to d/c. Unable to determine.  Code Status:     Code Status Orders  (From admission, onward)         Start     Ordered   04/25/20 0107  Do not attempt resuscitation (DNR)  Continuous       Question Answer Comment  In the event of cardiac or respiratory ARREST Do not call a "code blue"   In the event of cardiac or respiratory ARREST Do not perform Intubation, CPR, defibrillation or ACLS   In the event of cardiac or respiratory ARREST Use medication by any route, position, wound care, and other measures to  relive pain and suffering. May use oxygen, suction and manual treatment of airway obstruction as needed for comfort.      04/25/20 0110        Code Status History    Date Active Date Inactive Code Status Order ID Comments User Context   04/10/2020 1017 04/18/2020 2243 DNR [id number]  Jimmy Footman, NP Inpatient   03/30/2020 1807 04/10/2020 1017 Full Code [id number]  Ivor Costa, MD Inpatient   06/19/2019 1324 06/19/2019 1801 Full Code  [id number]  Nelva Bush, MD Inpatient   06/02/2019 0010 06/09/2019 2026 Full Code [id number]  Lance Coon, MD Inpatient   05/19/2019 1159 05/26/2019 2135 Full Code [id number]  Hillary Bow, MD ED   02/23/2019 1918 02/25/2019 1621 Full Code [id number]  Lang Snow, NP ED   04/18/2018 0209 04/20/2018 1930 Full Code [id number]  Arta Silence, MD Inpatient   09/05/2016 0305 09/11/2016 1601 Full Code [id number]  Hugelmeyer, Ubaldo Glassing, DO Inpatient   Advance Care Planning Activity        IV Access:    Peripheral IV   Procedures and diagnostic studies:   US Abdomen Complete  Result Date: 04/25/2020 CLINICAL DATA:  Abnormal LFTs EXAM: ABDOMEN ULTRASOUND COMPLETE COMPARISON:  None. FINDINGS: Gallbladder: Cholecystectomy Common bile duct: Diameter: 6 mm, within normal limits Liver: No focal lesion identified. Within normal limits in parenchymal echogenicity. Portal vein is patent on color Doppler imaging with normal direction of blood flow towards the liver. IVC: No abnormality visualized. Pancreas: Visualized portion unremarkable. Spleen: Suboptimal visualization.  Likely within normal limits. Right Kidney: Length: 11.7 cm. Echogenicity likely within normal limits. No mass or hydronephrosis visualized. Left Kidney: Length: Suboptimal visualization. 11.2 cm. Echogenicity within normal limits. No mass or hydronephrosis visualized. Abdominal aorta: No aneurysm visualized. Other findings: Trace ascites. Technically suboptimal due to body habitus and rapid breathing. IMPRESSION: Trace ascites.  Otherwise unremarkable. Electronically Signed   By: Macy Mis M.D.   On: 04/25/2020 11:09     Medical Consultants:    None.  Anti-Infectives:   none  Subjective:    Julie Bender she relates her breathing is better today.  Objective:    Vitals:   04/26/20 2130 04/26/20 2303 04/26/20 2338 04/27/20 0744  BP:   128/61 126/71  Pulse: 74  76 75  Resp: (!) 21  (!) 22 20  Temp:  98.1  F (36.7 C) 97.6 F (36.4 C) 97.6 F (36.4 C)  TempSrc:   Oral Oral  SpO2: 96%  98% 98%  Weight:  95.3 kg    Height:  5\' 10"  (1.778 m)     SpO2: 98 % O2 Flow Rate (L/min): 5 L/min FiO2 (%): (!) 4 %  No intake or output data in the 24 hours ending 04/27/20 0938 Filed Weights   04/26/20 1130 04/26/20 2303  Weight: 95.3 kg 95.3 kg    Exam: General exam: In no acute distress. Respiratory system: Good air movement with crackles at bases. Cardiovascular system: S1 & S2 heard, RRR.  Positive JVD Gastrointestinal system: Abdomen is nondistended, soft and nontender.  Extremities: 3+ edema Skin: No rashes, lesions or ulcers  Data Reviewed:    Labs: Basic Metabolic Panel: Recent Labs  Lab 04/28/2020 2056 05/12/2020 2056 04/25/20 0203 04/25/20 0203 04/26/20 1109 04/27/20 0359  NA 136  --  136  --  135 136  K 4.8   < > 4.8   < > 4.8 4.3  CL 97*  --  99  --  96* 98  CO2  24  --  23  --  22 25  GLUCOSE 218*  --  206*  --  320* 142*  BUN 26*  --  28*  --  46* 50*  CREATININE 1.67*  --  1.65*  --  2.21* 2.34*  CALCIUM 9.1  --  9.0  --  9.3 9.6  MG  --   --   --   --  1.7 1.7   < > = values in this interval not displayed.   GFR Estimated Creatinine Clearance: 31.6 mL/min (A) (by C-G formula based on SCr of 2.34 mg/dL (H)). Liver Function Tests: Recent Labs  Lab 05/03/2020 2056 04/25/20 0203 04/26/20 1109 04/27/20 0359  AST 232* 230* 157* 125*  ALT 113* 110* 90* 81*  ALKPHOS 160* 154* 150* 145*  BILITOT 2.7* 2.5* 2.3* 2.2*  PROT 6.7 6.7 7.2 7.1  ALBUMIN 3.7 3.8 3.8 3.7   No results for input(s): LIPASE, AMYLASE in the last 168 hours. No results for input(s): AMMONIA in the last 168 hours. Coagulation profile No results for input(s): INR, PROTIME in the last 168 hours. COVID-19 Labs  Recent Labs    04/25/20 0203 04/26/20 1109 04/27/20 0359  DDIMER 7.99*  --   --   FERRITIN 501* 1,146* 1,258*  CRP 8.4* 7.1* 5.3*    Lab Results  Component Value Date    SARSCOV2NAA POSITIVE (A) 04/25/2020   SARSCOV2NAA NEGATIVE 04/18/2020   [place] NEGATIVE 03/30/2020   [place] NEGATIVE 06/15/2019    CBC: Recent Labs  Lab 04/25/2020 2056 04/25/20 0203 04/26/20 1109 04/27/20 0359  WBC 6.6 6.1 4.5 8.0  NEUTROABS 5.6 4.9 3.9 7.0  HGB 7.8* 8.1* 8.4* 8.0*  HCT 25.6* 26.5* 26.6* 25.3*  MCV 81.0 83.1 81.3 76.9*  PLT 137* 127* 158 176   Cardiac Enzymes: No results for input(s): CKTOTAL, CKMB, CKMBINDEX, TROPONINI in the last 168 hours. BNP (last 3 results) No results for input(s): PROBNP in the last 8760 hours. CBG: Recent Labs  Lab 04/26/20 0933 04/26/20 1233 04/26/20 1721 04/26/20 2259 04/27/20 0747  GLUCAP 313* 250* 214* 209* 114*   D-Dimer: Recent Labs    04/25/20 0203  DDIMER 7.99*   Hgb A1c: Recent Labs    04/26/20 1109  HGBA1C 7.4*   Lipid Profile: No results for input(s): CHOL, HDL, LDLCALC, TRIG, CHOLHDL, LDLDIRECT in the last 72 hours. Thyroid function studies: No results for input(s): TSH, T4TOTAL, T3FREE, THYROIDAB in the last 72 hours.  Invalid input(s): FREET3 Anemia work up: Recent Labs    04/26/20 1109 04/27/20 0359  FERRITIN 1,146* 1,258*   Sepsis Labs: Recent Labs  Lab 05/13/2020 2056 04/25/20 0203 04/25/20 0803 04/26/20 1109 04/27/20 0359  PROCALCITON  --  0.83 0.99 1.12  --   WBC 6.6 6.1  --  4.5 8.0   Microbiology Recent Results (from the past 240 hour(s))  Respiratory Panel by RT PCR (Flu A&B, Covid) - Nasopharyngeal Swab     Status: None   Collection Time: 04/18/20 12:35 PM   Specimen: Nasopharyngeal Swab  Result Value Ref Range Status   SARS Coronavirus 2 by RT PCR NEGATIVE NEGATIVE Final    Comment: (NOTE) SARS-CoV-2 target nucleic acids are NOT DETECTED.  The SARS-CoV-2 RNA is generally detectable in upper respiratoy specimens during the acute phase of infection. The lowest concentration of SARS-CoV-2 viral copies this assay can detect is 131 copies/mL. A negative result does  not preclude SARS-Cov-2 infection and should not be used as the sole basis for treatment  or other patient management decisions. A negative result may occur with  improper specimen collection/handling, submission of specimen other than nasopharyngeal swab, presence of viral mutation(s) within the areas targeted by this assay, and inadequate number of viral copies (<131 copies/mL). A negative result must be combined with clinical observations, patient history, and epidemiological information. The expected result is Negative.  Fact Sheet for Patients:  PinkCheek.be  Fact Sheet for Healthcare Providers:  GravelBags.it  This test is no t yet approved or cleared by the [place] FDA and  has been authorized for detection and/or diagnosis of SARS-CoV-2 by FDA under an Emergency Use Authorization (EUA). This EUA will remain  in effect (meaning this test can be used) for the duration of the COVID-19 declaration under Section 564(b)(1) of the Act, 21 U.S.C. section 360bbb-3(b)(1), unless the authorization is terminated or revoked sooner.     Influenza A by PCR NEGATIVE NEGATIVE Final   Influenza B by PCR NEGATIVE NEGATIVE Final    Comment: (NOTE) The Xpert Xpress SARS-CoV-2/FLU/RSV assay is intended as an aid in  the diagnosis of influenza from Nasopharyngeal swab specimens and  should not be used as a sole basis for treatment. Nasal washings and  aspirates are unacceptable for Xpert Xpress SARS-CoV-2/FLU/RSV  testing.  Fact Sheet for Patients: PinkCheek.be  Fact Sheet for Healthcare Providers: GravelBags.it  This test is not yet approved or cleared by the [place] FDA and  has been authorized for detection and/or diagnosis of SARS-CoV-2 by  FDA under an Emergency Use Authorization (EUA). This EUA will remain  in effect (meaning this test can be used) for the  duration of the  Covid-19 declaration under Section 564(b)(1) of the Act, 21  U.S.C. section 360bbb-3(b)(1), unless the authorization is  terminated or revoked. Performed at [facility], [address] [place], [place] [address]   SARS Coronavirus 2 by RT PCR (hospital order, performed in [facility] hospital lab) Nasopharyngeal Nasopharyngeal Swab     Status: Abnormal   Collection Time: 04/20/2020 10:10 PM   Specimen: Nasopharyngeal Swab  Result Value Ref Range Status   SARS Coronavirus 2 POSITIVE (A) NEGATIVE Final    Comment: RESULT CALLED TO, READ BACK BY AND VERIFIED WITH: [email or web address] @0023  04/25/20 EB (NOTE) SARS-CoV-2 target nucleic acids are DETECTED  SARS-CoV-2 RNA is generally detectable in upper respiratory specimens  during the acute phase of infection.  Positive results are indicative  of the presence of the identified virus, but do not rule out bacterial infection or co-infection with other pathogens not detected by the test.  Clinical correlation with patient history and  other diagnostic information is necessary to determine patient infection status.  The expected result is negative.  Fact Sheet for Patients:   StrictlyIdeas.no   Fact Sheet for Healthcare Providers:   BankingDealers.co.za    This test is not yet approved or cleared by the [place] FDA and  has been authorized for detection and/or diagnosis of SARS-CoV-2 by FDA under an Emergency Use Authorization (EUA).  This EUA will remain in effect (meaning this test can  be used) for the duration of  the COVID-19 declaration under Section 564(b)(1) of the Act, 21 U.S.C. section 360-bbb-3(b)(1), unless the authorization is terminated or revoked sooner.  Performed at [facility], [place] [address] [place], [place] [address]      Medications:   . amiodarone  200 mg Oral Daily  . apixaban  5 mg Oral BID  . vitamin C  500  mg Oral Daily  .  aspirin EC  81 mg Oral Daily  . atorvastatin  20 mg Oral Daily  . famotidine  20 mg Oral Daily  . feeding supplement (ENSURE ENLIVE)  237 mL Oral TID BM  . ferrous sulfate  325 mg Oral Q breakfast  . furosemide  80 mg Intravenous Q8H  . gabapentin  600 mg Oral TID AC & HS  . insulin aspart  0-15 Units Subcutaneous TID WC  . insulin aspart  0-5 Units Subcutaneous QHS  . insulin aspart  4 Units Subcutaneous TID WC  . insulin detemir  20 Units Subcutaneous BID  . Ipratropium-Albuterol  1 puff Inhalation Q6H  . linagliptin  5 mg Oral Daily  . methylPREDNISolone (SOLU-MEDROL) injection  100 mg Intravenous BID   Followed by  . [START ON 04/28/2020] predniSONE  50 mg Oral Daily  . mometasone-formoterol  2 puff Inhalation BID  . pantoprazole  40 mg Oral Daily  . polyethylene glycol  17 g Oral Daily  . sertraline  100 mg Oral Daily  . spironolactone  25 mg Oral Daily  . cyanocobalamin  1,000 mcg Oral Daily  . zinc sulfate  220 mg Oral Daily   Continuous Infusions: . remdesivir 100 mg in NS 100 mL Stopped (04/26/20 1318)      LOS: 2 days   Charlynne Cousins  [facility]  04/27/2020, 9:38 AM

## 2020-04-28 ENCOUNTER — Inpatient Hospital Stay (HOSPITAL_COMMUNITY): Payer: Medicare Other

## 2020-04-28 DIAGNOSIS — J1282 Pneumonia due to coronavirus disease 2019: Secondary | ICD-10-CM

## 2020-04-28 DIAGNOSIS — J9621 Acute and chronic respiratory failure with hypoxia: Secondary | ICD-10-CM

## 2020-04-28 DIAGNOSIS — J80 Acute respiratory distress syndrome: Secondary | ICD-10-CM

## 2020-04-28 DIAGNOSIS — U071 COVID-19: Principal | ICD-10-CM

## 2020-04-28 DIAGNOSIS — N179 Acute kidney failure, unspecified: Secondary | ICD-10-CM

## 2020-04-28 DIAGNOSIS — Z9911 Dependence on respirator [ventilator] status: Secondary | ICD-10-CM

## 2020-04-28 DIAGNOSIS — N189 Chronic kidney disease, unspecified: Secondary | ICD-10-CM

## 2020-04-28 DIAGNOSIS — J9602 Acute respiratory failure with hypercapnia: Secondary | ICD-10-CM

## 2020-04-28 DIAGNOSIS — J9601 Acute respiratory failure with hypoxia: Secondary | ICD-10-CM

## 2020-04-28 LAB — CBC WITH DIFFERENTIAL/PLATELET
Abs Immature Granulocytes: 0 10*3/uL (ref 0.00–0.07)
Basophils Absolute: 0 10*3/uL (ref 0.0–0.1)
Basophils Relative: 0 %
Eosinophils Absolute: 0.3 10*3/uL (ref 0.0–0.5)
Eosinophils Relative: 2 %
HCT: 28.7 % — ABNORMAL LOW (ref 36.0–46.0)
Hemoglobin: 9.1 g/dL — ABNORMAL LOW (ref 12.0–15.0)
Lymphocytes Relative: 6 %
Lymphs Abs: 0.8 10*3/uL (ref 0.7–4.0)
MCH: 25 pg — ABNORMAL LOW (ref 26.0–34.0)
MCHC: 31.7 g/dL (ref 30.0–36.0)
MCV: 78.8 fL — ABNORMAL LOW (ref 80.0–100.0)
Monocytes Absolute: 1.3 10*3/uL — ABNORMAL HIGH (ref 0.1–1.0)
Monocytes Relative: 9 %
Neutro Abs: 11.5 10*3/uL — ABNORMAL HIGH (ref 1.7–7.7)
Neutrophils Relative %: 83 %
Platelets: 239 10*3/uL (ref 150–400)
RBC: 3.64 MIL/uL — ABNORMAL LOW (ref 3.87–5.11)
RDW: 22.5 % — ABNORMAL HIGH (ref 11.5–15.5)
WBC: 13.9 10*3/uL — ABNORMAL HIGH (ref 4.0–10.5)
nRBC: 16 /100 WBC — ABNORMAL HIGH
nRBC: 5 % — ABNORMAL HIGH (ref 0.0–0.2)

## 2020-04-28 LAB — PATHOLOGIST SMEAR REVIEW

## 2020-04-28 LAB — COMPREHENSIVE METABOLIC PANEL
ALT: 80 U/L — ABNORMAL HIGH (ref 0–44)
AST: 108 U/L — ABNORMAL HIGH (ref 15–41)
Albumin: 4.1 g/dL (ref 3.5–5.0)
Alkaline Phosphatase: 170 U/L — ABNORMAL HIGH (ref 38–126)
Anion gap: 15 (ref 5–15)
BUN: 60 mg/dL — ABNORMAL HIGH (ref 8–23)
CO2: 25 mmol/L (ref 22–32)
Calcium: 9.8 mg/dL (ref 8.9–10.3)
Chloride: 95 mmol/L — ABNORMAL LOW (ref 98–111)
Creatinine, Ser: 2.82 mg/dL — ABNORMAL HIGH (ref 0.44–1.00)
GFR calc Af Amer: 20 mL/min — ABNORMAL LOW (ref >60)
GFR calc non Af Amer: 17 mL/min — ABNORMAL LOW (ref >60)
Glucose, Bld: 134 mg/dL — ABNORMAL HIGH (ref 70–99)
Potassium: 5.1 mmol/L (ref 3.5–5.1)
Sodium: 135 mmol/L (ref 135–145)
Total Bilirubin: 2.4 mg/dL — ABNORMAL HIGH (ref 0.3–1.2)
Total Protein: 7.9 g/dL (ref 6.5–8.1)

## 2020-04-28 LAB — POCT I-STAT 7, (LYTES, BLD GAS, ICA,H+H)
Acid-Base Excess: 4 mmol/L — ABNORMAL HIGH (ref 0.0–2.0)
Acid-base deficit: 1 mmol/L (ref 0.0–2.0)
Bicarbonate: 27 mmol/L (ref 20.0–28.0)
Bicarbonate: 27.7 mmol/L (ref 20.0–28.0)
Calcium, Ion: 1.18 mmol/L (ref 1.15–1.40)
Calcium, Ion: 1.12 mmol/L — ABNORMAL LOW (ref 1.15–1.40)
HCT: 34 % — ABNORMAL LOW (ref 36.0–46.0)
HCT: 31 % — ABNORMAL LOW (ref 36.0–46.0)
Hemoglobin: 11.6 g/dL — ABNORMAL LOW (ref 12.0–15.0)
Hemoglobin: 10.5 g/dL — ABNORMAL LOW (ref 12.0–15.0)
O2 Saturation: 56 %
O2 Saturation: 100 %
Patient temperature: 98.5
Patient temperature: 98.5
Potassium: 4.9 mmol/L (ref 3.5–5.1)
Potassium: 5 mmol/L (ref 3.5–5.1)
Sodium: 138 mmol/L (ref 135–145)
Sodium: 136 mmol/L (ref 135–145)
TCO2: 29 mmol/L (ref 22–32)
TCO2: 29 mmol/L (ref 22–32)
pCO2 arterial: 58.2 mmHg — ABNORMAL HIGH (ref 32.0–48.0)
pCO2 arterial: 37 mmHg (ref 32.0–48.0)
pH, Arterial: 7.274 — ABNORMAL LOW (ref 7.350–7.450)
pH, Arterial: 7.482 — ABNORMAL HIGH (ref 7.350–7.450)
pO2, Arterial: 34 mmHg — CL (ref 83.0–108.0)
pO2, Arterial: 177 mmHg — ABNORMAL HIGH (ref 83.0–108.0)

## 2020-04-28 LAB — BLOOD GAS, ARTERIAL
Acid-base deficit: 4.4 mmol/L — ABNORMAL HIGH (ref 0.0–2.0)
Bicarbonate: 23.8 mmol/L (ref 20.0–28.0)
Drawn by: 56037
FIO2: 100
O2 Saturation: 67.6 %
Patient temperature: 36.5
pCO2 arterial: 73.9 mmHg (ref 32.0–48.0)
pH, Arterial: 7.13 — CL (ref 7.350–7.450)
pO2, Arterial: 47.9 mmHg — ABNORMAL LOW (ref 83.0–108.0)

## 2020-04-28 LAB — GLUCOSE, CAPILLARY
Glucose-Capillary: 133 mg/dL — ABNORMAL HIGH (ref 70–99)
Glucose-Capillary: 71 mg/dL (ref 70–99)
Glucose-Capillary: 65 mg/dL — ABNORMAL LOW (ref 70–99)
Glucose-Capillary: 81 mg/dL (ref 70–99)
Glucose-Capillary: 86 mg/dL (ref 70–99)
Glucose-Capillary: 105 mg/dL — ABNORMAL HIGH (ref 70–99)
Glucose-Capillary: 158 mg/dL — ABNORMAL HIGH (ref 70–99)

## 2020-04-28 LAB — FERRITIN: Ferritin: 1404 ng/mL — ABNORMAL HIGH (ref 11–307)

## 2020-04-28 LAB — C-REACTIVE PROTEIN: CRP: 5.6 mg/dL — ABNORMAL HIGH (ref <1.0)

## 2020-04-28 LAB — D-DIMER, QUANTITATIVE: D-Dimer, Quant: 8.4 ug/mL-FEU — ABNORMAL HIGH (ref 0.00–0.50)

## 2020-04-28 LAB — PHOSPHORUS
Phosphorus: 5.2 mg/dL — ABNORMAL HIGH (ref 2.5–4.6)
Phosphorus: 4.8 mg/dL — ABNORMAL HIGH (ref 2.5–4.6)

## 2020-04-28 LAB — BRAIN NATRIURETIC PEPTIDE: B Natriuretic Peptide: 4090 pg/mL — ABNORMAL HIGH (ref 0.0–100.0)

## 2020-04-28 LAB — MAGNESIUM: Magnesium: 2.1 mg/dL (ref 1.7–2.4)

## 2020-04-28 LAB — PROCALCITONIN: Procalcitonin: 0.67 ng/mL

## 2020-04-28 MED ORDER — PREDNISONE 20 MG PO TABS
50.0000 mg | ORAL_TABLET | Freq: Every day | ORAL | Status: DC
  Administered 2020-04-28 – 2020-04-30 (×3): 50 mg
  Filled 2020-04-28 (×3): qty 1

## 2020-04-28 MED ORDER — FENTANYL 2500MCG IN NS 250ML (10MCG/ML) PREMIX INFUSION
0.0000 ug/h | INTRAVENOUS | Status: DC
  Administered 2020-04-28: 100 ug/h via INTRAVENOUS
  Administered 2020-04-29 (×2): 400 ug/h via INTRAVENOUS
  Administered 2020-04-29: 200 ug/h via INTRAVENOUS
  Administered 2020-04-30 (×3): 400 ug/h via INTRAVENOUS
  Filled 2020-04-28 (×7): qty 250

## 2020-04-28 MED ORDER — AMIODARONE HCL 200 MG PO TABS
200.0000 mg | ORAL_TABLET | Freq: Every day | ORAL | Status: DC
  Administered 2020-04-29: 200 mg
  Filled 2020-04-28 (×2): qty 1

## 2020-04-28 MED ORDER — MIDAZOLAM HCL 2 MG/2ML IJ SOLN
2.0000 mg | INTRAMUSCULAR | Status: DC | PRN
  Administered 2020-04-28: 2 mg via INTRAVENOUS
  Filled 2020-04-28 (×2): qty 2

## 2020-04-28 MED ORDER — NOREPINEPHRINE 4 MG/250ML-% IV SOLN
2.0000 ug/min | INTRAVENOUS | Status: DC
  Administered 2020-04-28: 3 ug/min via INTRAVENOUS
  Filled 2020-04-28: qty 250

## 2020-04-28 MED ORDER — IPRATROPIUM-ALBUTEROL 0.5-2.5 (3) MG/3ML IN SOLN
3.0000 mL | Freq: Four times a day (QID) | RESPIRATORY_TRACT | Status: DC
  Administered 2020-04-28 – 2020-04-30 (×9): 3 mL via RESPIRATORY_TRACT
  Filled 2020-04-28 (×8): qty 3

## 2020-04-28 MED ORDER — CHLORHEXIDINE GLUCONATE 0.12% ORAL RINSE (MEDLINE KIT)
15.0000 mL | Freq: Two times a day (BID) | OROMUCOSAL | Status: DC
  Administered 2020-04-28 – 2020-04-30 (×5): 15 mL via OROMUCOSAL

## 2020-04-28 MED ORDER — CHLORHEXIDINE GLUCONATE CLOTH 2 % EX PADS
6.0000 | MEDICATED_PAD | Freq: Every day | CUTANEOUS | Status: DC
  Administered 2020-04-28 – 2020-04-30 (×2): 6 via TOPICAL

## 2020-04-28 MED ORDER — ASPIRIN 81 MG PO CHEW
81.0000 mg | CHEWABLE_TABLET | Freq: Every day | ORAL | Status: DC
  Administered 2020-04-28 – 2020-04-30 (×3): 81 mg
  Filled 2020-04-28 (×3): qty 1

## 2020-04-28 MED ORDER — ARFORMOTEROL TARTRATE 15 MCG/2ML IN NEBU
15.0000 ug | INHALATION_SOLUTION | Freq: Two times a day (BID) | RESPIRATORY_TRACT | Status: DC
  Administered 2020-04-28 – 2020-04-30 (×5): 15 ug via RESPIRATORY_TRACT
  Filled 2020-04-28 (×5): qty 2

## 2020-04-28 MED ORDER — ASCORBIC ACID 500 MG PO TABS: 500.0000 mg | ORAL_TABLET | Freq: Every day | ORAL | Status: DC

## 2020-04-28 MED ORDER — DOCUSATE SODIUM 50 MG/5ML PO LIQD: 100.0000 mg | Freq: Two times a day (BID) | ORAL | Status: DC

## 2020-04-28 MED ORDER — VITAL HIGH PROTEIN PO LIQD
1000.0000 mL | ORAL | Status: DC
  Administered 2020-04-28 – 2020-04-30 (×4): 1000 mL

## 2020-04-28 MED ORDER — NOREPINEPHRINE 4 MG/250ML-% IV SOLN
0.0000 ug/min | INTRAVENOUS | Status: DC
  Administered 2020-04-28 – 2020-04-30 (×2): 2 ug/min via INTRAVENOUS
  Filled 2020-04-28: qty 250

## 2020-04-28 MED ORDER — POLYETHYLENE GLYCOL 3350 17 G PO PACK
17.0000 g | PACK | Freq: Every day | ORAL | Status: DC
  Administered 2020-04-28 – 2020-04-30 (×3): 17 g
  Filled 2020-04-28 (×3): qty 1

## 2020-04-28 MED ORDER — MIDAZOLAM HCL 2 MG/2ML IJ SOLN
2.0000 mg | INTRAMUSCULAR | Status: DC | PRN
  Administered 2020-04-29 (×2): 2 mg via INTRAVENOUS
  Filled 2020-04-28: qty 2

## 2020-04-28 MED ORDER — VANCOMYCIN HCL 1750 MG/350ML IV SOLN
1750.0000 mg | Freq: Once | INTRAVENOUS | Status: AC
  Administered 2020-04-28: 1750 mg via INTRAVENOUS
  Filled 2020-04-28: qty 350

## 2020-04-28 MED ORDER — VITAMIN B-12 1000 MCG PO TABS
1000.0000 ug | ORAL_TABLET | Freq: Every day | ORAL | Status: DC
  Administered 2020-04-28 – 2020-04-30 (×3): 1000 ug
  Filled 2020-04-28 (×3): qty 1

## 2020-04-28 MED ORDER — APIXABAN 5 MG PO TABS
5.0000 mg | ORAL_TABLET | Freq: Two times a day (BID) | ORAL | Status: DC
  Administered 2020-04-28 – 2020-04-29 (×3): 5 mg
  Filled 2020-04-28 (×3): qty 1

## 2020-04-28 MED ORDER — PROSOURCE TF PO LIQD
45.0000 mL | Freq: Every day | ORAL | Status: DC
  Administered 2020-04-29 – 2020-04-30 (×2): 45 mL
  Filled 2020-04-28 (×3): qty 45

## 2020-04-28 MED ORDER — SODIUM CHLORIDE 0.9 % IV SOLN
250.0000 mL | INTRAVENOUS | Status: DC
  Administered 2020-04-28: 250 mL via INTRAVENOUS

## 2020-04-28 MED ORDER — DEXTROSE 50 % IV SOLN
25.0000 mL | Freq: Once | INTRAVENOUS | Status: AC
  Administered 2020-04-28: 25 mL via INTRAVENOUS

## 2020-04-28 MED ORDER — FENTANYL CITRATE (PF) 100 MCG/2ML IJ SOLN: 50.0000 ug | INTRAMUSCULAR | Status: DC | PRN

## 2020-04-28 MED ORDER — FERROUS SULFATE 220 (44 FE) MG/5ML PO ELIX
220.0000 mg | ORAL_SOLUTION | Freq: Every day | ORAL | Status: DC
  Administered 2020-04-28 – 2020-04-30 (×3): 220 mg
  Filled 2020-04-28 (×5): qty 5

## 2020-04-28 MED ORDER — VANCOMYCIN HCL IN DEXTROSE 1-5 GM/200ML-% IV SOLN
1000.0000 mg | INTRAVENOUS | Status: DC
  Administered 2020-04-29 – 2020-04-30 (×2): 1000 mg via INTRAVENOUS
  Filled 2020-04-28 (×3): qty 200

## 2020-04-28 MED ORDER — SENNOSIDES 8.8 MG/5ML PO SYRP
10.0000 mL | ORAL_SOLUTION | Freq: Every day | ORAL | Status: DC
  Administered 2020-04-28 – 2020-04-30 (×3): 10 mL
  Filled 2020-04-28 (×2): qty 10

## 2020-04-28 MED ORDER — DEXTROSE 50 % IV SOLN
INTRAVENOUS | Status: AC
  Filled 2020-04-28: qty 50

## 2020-04-28 MED ORDER — INSULIN ASPART 100 UNIT/ML ~~LOC~~ SOLN
0.0000 [IU] | SUBCUTANEOUS | Status: DC
  Administered 2020-04-28 – 2020-04-29 (×2): 3 [IU] via SUBCUTANEOUS
  Administered 2020-04-29: 4 [IU] via SUBCUTANEOUS
  Administered 2020-04-29: 8 [IU] via SUBCUTANEOUS
  Administered 2020-04-29: 15 [IU] via SUBCUTANEOUS
  Administered 2020-04-29: 11 [IU] via SUBCUTANEOUS
  Administered 2020-04-30: 8 [IU] via SUBCUTANEOUS
  Administered 2020-04-30 (×2): 15 [IU] via SUBCUTANEOUS
  Administered 2020-04-30: 8 [IU] via SUBCUTANEOUS

## 2020-04-28 MED ORDER — PANTOPRAZOLE SODIUM 40 MG IV SOLR
40.0000 mg | INTRAVENOUS | Status: DC
  Administered 2020-04-28 – 2020-04-29 (×2): 40 mg via INTRAVENOUS
  Filled 2020-04-28 (×2): qty 40

## 2020-04-28 MED ORDER — FENTANYL CITRATE (PF) 100 MCG/2ML IJ SOLN
50.0000 ug | INTRAMUSCULAR | Status: DC | PRN
  Administered 2020-04-28 – 2020-04-29 (×4): 100 ug via INTRAVENOUS
  Filled 2020-04-28: qty 2

## 2020-04-28 MED ORDER — ORAL CARE MOUTH RINSE
15.0000 mL | OROMUCOSAL | Status: DC
  Administered 2020-04-28 – 2020-04-30 (×21): 15 mL via OROMUCOSAL

## 2020-04-28 MED ORDER — SODIUM CHLORIDE 0.9 % IV SOLN
2.0000 g | INTRAVENOUS | Status: DC
  Administered 2020-04-28 – 2020-04-30 (×3): 2 g via INTRAVENOUS
  Filled 2020-04-28 (×4): qty 2

## 2020-04-28 MED ORDER — FUROSEMIDE 10 MG/ML IJ SOLN
80.0000 mg | Freq: Once | INTRAMUSCULAR | Status: AC
  Administered 2020-04-28: 80 mg via INTRAVENOUS
  Filled 2020-04-28: qty 8

## 2020-04-28 MED ORDER — PROPOFOL 1000 MG/100ML IV EMUL
0.0000 ug/kg/min | INTRAVENOUS | Status: DC
  Administered 2020-04-28: 20 ug/kg/min via INTRAVENOUS
  Filled 2020-04-28 (×2): qty 100

## 2020-04-28 MED ORDER — ZINC SULFATE 220 (50 ZN) MG PO CAPS: 220.0000 mg | ORAL_CAPSULE | Freq: Every day | ORAL | Status: DC

## 2020-04-28 NOTE — Consult Note (Signed)
NAME:  Julie Bender, MRN:  [id number], DOB:  07-31-1961, LOS: 3 ADMISSION DATE:  05/07/2020, CONSULTATION DATE:  04/28/20 REFERRING MD: Cyd Silence   CHIEF COMPLAINT:  Respiratory failure   Brief History   Julie Bender is a 59 y.o. female from SNF who was admitted 9/10 with worsening dyspnea and hypoxia 2/2 COVID PNA.  She had worsening hypoxia and hypercapnia early AM 9/13 and required intubation.  History of present illness   Pt is encephelopathic; therefore, this HPI is obtained from chart review. Julie Bender is a 59 y.o. female who has a PMH as outlined below.  She presented to [facility] ED 9/10 with worsening dyspnea and hypoxia.  Had been on 15L O2 while at SNF but continued to complain of SOB; therefore, EMS was called.  In ED, oxygenation gradually improved and she was weaned to 4L.  COVID returned positive.  She was started on remdesivir, solumedrol, baricitinib.  She was seen by PMT who confirmed DNR / DNI with continued supportive medical therapies otherwise.  Early AM 9/13 around 0220 AM, she had desaturation into 70s despite 5L HFNC.  She was increased to 15L with improvement.  Around 0530, she was hypoxic again and also had new confusion. ABG revealed acute hypoxic and hypercapnic respiratory failure (8.13 / 74 / 48).  She progressed to the point of obtundation.  Rapid response and PCCM called emergently.  Case discussed by overnight team with family who opted to go ahead with intubation but otherwise continue with DNR in the event of arrest.  She was subsequently intubated and was transferred to [facility] ICU for ongoing care.  Of note, she had recent admission to [facility] 8/15 through 9/3 for AoC hypoxic respiratory failure and CHF exacerbation.  She was discharged to SNF.  Past Medical History  has SOB (shortness of breath); Depression; Elevated troponin; Acute on chronic respiratory failure with hypoxia ([place]); Obstructive sleep apnea; Morbid obesity ([place]); Chronic heart failure with preserved  ejection fraction ([place]); End stage renal disease on dialysis [facility]); Chronic obstructive asthma ([place]); Acute on chronic diastolic CHF (congestive heart failure) ([place]); COPD (chronic obstructive pulmonary disease) ([place]); Hypertension; Type II diabetes mellitus with renal manifestations ([place]); ESRD (end stage renal disease) ([place]); CKD (chronic kidney disease), stage IIIa; Atrial fibrillation, chronic ([place]); Anemia due to chronic kidney disease; Acute kidney injury superimposed on chronic kidney disease IIIb ([place]); Thrombocytopenia ([place]); Elevated LFTs; Pneumonia due to COVID-19 virus; Palliative care by specialist; Goals of care, counseling/discussion; DNR (do not resuscitate); DNI (do not intubate); Encounter for hospice care discussion; Generalized weakness; and Acute sore throat on their problem list.  Significant Hospital Events   9/10 > admit. 9/13 > intubated and transferred to ICU.  Consults:  PMT.  Procedures:  ETT 9/13 >   Significant Diagnostic Tests:  CXR 9/13 > patchy infiltrates, vascular congestion.  Micro Data:  COVID 9/10 > positive.  Antimicrobials:  Cefepime x 1 9/9  Interim history/subjective:  Just intubated and transferred to ICU.  Objective:  Blood pressure (!) 102/32, pulse 100, temperature 98.5 F (36.9 C), temperature source Axillary, resp. rate (!) 24, height 5\' 10"  (1.778 m), weight 95.3 kg, SpO2 93 %.    Vent Mode: PRVC FiO2 (%):  [100 %] 100 % Set Rate:  [24 bmp] 24 bmp Vt Set:  [410 mL] 410 mL PEEP:  [10 cmH20] 10 cmH20 Plateau Pressure:  [29 cmH20] 29 cmH20   Intake/Output Summary (Last 24 hours) at 04/28/2020 0910 Last data filed at 04/28/2020 0549 Gross per  24 hour  Intake 774.24 ml  Output 500 ml  Net 274.24 ml   Filed Weights   04/26/20 1130 04/26/20 2303  Weight: 95.3 kg 95.3 kg    Examination: General: Adult female, chronically ill appearing. Neuro: Non-responsive. HEENT: [place]/AT. Sclerae anicteric.  ETT in place. Cardiovascular: RRR,  no M/R/G.  Lungs: Respirations even and unlabored.  CTA bilaterally, No W/R/R.  Abdomen: BS x 4, soft, NT/ND.  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  1+ pitting edema BLE's.  Skin: Dry gangrene as above.  Otherwise warm, no rashes.  Assessment & Plan:   Acute on chronic hypoxic with acute hypercapnic respiratory failure - s/p emergent intubation AM 9/13. Hx COPD per report. - Continue full vent support. - Wean as able. - Bronchial hygiene. - Repeat ABG. - Would like to diurese more but holding for now given bump in SCr. - Continue BD's. - Follow CXR.  COVID PNA. - Continue remdesivir, solumedrol, baricitinib.  Hx dCHF, A.fib, HTN. - Holding diuresis given bump in SCr. - Continue home Eliquis, amiodarone, ASA. - Hold home atoravstatin given transaminitis.  AoCKD. - Supportive care. - Follow BMP.  Hx DM. - SSI. - Start TF's. - Home home meds.  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 - Supportive care.  Transaminitis - presumed 2/2 COVID. - Trend LFT's.  Hx Depression. - Hold home sertraline.  Goals of care - PMT notes reviewed, DNR / DNI had been in place; however, upon decompensation early AM 9/13, sister Kennyth Lose informed team that while she was indeed DNR, she would still opt for intubation. - Continue supportive care as above with DNR orders in place in the event of arrest.   Best Practice:  Diet: Start TFs. Pain/Anxiety/Delirium protocol (if indicated): Propofol gtt / Fentanyl gtt.  RASS goal -1. VAP protocol (if indicated): In place. DVT prophylaxis: SCD's / Apixaban. GI prophylaxis: PPI. Glucose control: SSI. Mobility: Bedrest. Code Status: DNR. Family Communication: Sister updated by [facility] prior to intubation.  Will attempt to call again later this morning. Disposition: ICU.  Labs   CBC: Recent Labs  Lab 04/23/2020 2056 04/25/20 0203 04/26/20 1109 04/27/20 0359 04/28/20 0440  WBC 6.6 6.1 4.5  8.0 13.9*  NEUTROABS 5.6 4.9 3.9 7.0 11.5*  HGB 7.8* 8.1* 8.4* 8.0* 9.1*  HCT 25.6* 26.5* 26.6* 25.3* 28.7*  MCV 81.0 83.1 81.3 76.9* 78.8*  PLT 137* 127* 158 176 170   Basic Metabolic Panel: Recent Labs  Lab 05/13/2020 2056 04/25/20 0203 04/26/20 1109 04/27/20 0359 04/28/20 0440  NA 136 136 135 136 135  K 4.8 4.8 4.8 4.3 5.1  CL 97* 99 96* 98 95*  CO2 24 23 22 25 25   GLUCOSE 218* 206* 320* 142* 134*  BUN 26* 28* 46* 50* 60*  CREATININE 1.67* 1.65* 2.21* 2.34* 2.82*  CALCIUM 9.1 9.0 9.3 9.6 9.8  MG  --   --  1.7 1.7 2.1   GFR: Estimated Creatinine Clearance: 26.2 mL/min (A) (by C-G formula based on SCr of 2.82 mg/dL (H)). Recent Labs  Lab 04/25/20 0203 04/25/20 0803 04/26/20 1109 04/27/20 0359 04/28/20 0440  PROCALCITON 0.83 0.99 1.12  --  0.67  WBC 6.1  --  4.5 8.0 13.9*   Liver Function Tests: Recent Labs  Lab 05/03/2020 2056 04/25/20 0203 04/26/20 1109 04/27/20 0359 04/28/20 0440  AST 232* 230* 157* 125* 108*  ALT 113* 110* 90* 81* 80*  ALKPHOS 160* 154* 150* 145* 170*  BILITOT  2.7* 2.5* 2.3* 2.2* 2.4*  PROT 6.7 6.7 7.2 7.1 7.9  ALBUMIN 3.7 3.8 3.8 3.7 4.1   No results for input(s): LIPASE, AMYLASE in the last 168 hours. No results for input(s): AMMONIA in the last 168 hours. ABG    Component Value Date/Time   PHART 7.130 (LL) 04/28/2020 0530   PCO2ART 73.9 (HH) 04/28/2020 0530   PO2ART 47.9 (L) 04/28/2020 0530   HCO3 23.8 04/28/2020 0530   ACIDBASEDEF 4.4 (H) 04/28/2020 0530   O2SAT 67.6 04/28/2020 0530    Coagulation Profile: No results for input(s): INR, PROTIME in the last 168 hours. Cardiac Enzymes: No results for input(s): CKTOTAL, CKMB, CKMBINDEX, TROPONINI in the last 168 hours. HbA1C: Hgb A1c MFr Bld  Date/Time Value Ref Range Status  04/26/2020 11:09 AM 7.4 (H) 4.8 - 5.6 % Final    Comment:    (NOTE) Pre diabetes:          5.7%-6.4%  Diabetes:              >6.4%  Glycemic control for   <7.0% adults with diabetes   03/30/2020  06:58 PM 6.7 (H) 4.8 - 5.6 % Final    Comment:    (NOTE) Pre diabetes:          5.7%-6.4%  Diabetes:              >6.4%  Glycemic control for   <7.0% adults with diabetes    CBG: Recent Labs  Lab 04/27/20 1256 04/27/20 1620 04/27/20 2113 04/28/20 0503 04/28/20 0905  GLUCAP 82 78 154* 133* 71    Review of Systems:   Unable to obtain as pt is encephalopathic.  Past medical history  She,  has a past medical history of Acute on chronic respiratory failure with hypoxia ([place]), Asthma, CHF (congestive heart failure) ([place]), Chronic heart failure with preserved ejection fraction ([place]), Chronic obstructive asthma ([place]), COPD (chronic obstructive pulmonary disease) ([place]), Diabetes mellitus without complication ([place]), Diabetic retinopathy ([place]), End stage renal disease on dialysis [facility]), History of placement of internal cardiac defibrillator, Hypertension, Hypertrophic cardiomyopathy ([place]), Hypertrophic cardiomyopathy ([place]), Morbid obesity ([place]), and Obstructive sleep apnea.   Surgical History    Past Surgical History:  Procedure Laterality Date   CARDIAC DEFIBRILLATOR PLACEMENT     CHOLECYSTECTOMY     IR FLUORO GUIDE CV LINE LEFT  04/09/2020   RIGHT/LEFT HEART CATH AND CORONARY ANGIOGRAPHY N/A 06/19/2019   Procedure: RIGHT/LEFT HEART CATH AND CORONARY ANGIOGRAPHY;  Surgeon: Nelva Bush, MD;  Location: [facility];  Service: Cardiovascular;  Laterality: N/A;     Social History   reports that she is a non-smoker but has been exposed to tobacco smoke. She has never used smokeless tobacco. She reports that she does not drink alcohol and does not use drugs.   Family history   Her family history includes Hypertrophic cardiomyopathy in her sister.   Allergies No Known Allergies   Home meds  Prior to Admission medications   Medication Sig Start Date End Date Taking? Authorizing Provider  acetaminophen (TYLENOL) 500 MG tablet Take 1,000 mg by mouth every 6 (Bender) hours  as needed for mild pain.    Yes [provider]  albuterol (VENTOLIN HFA) 108 (90 Base) MCG/ACT inhaler Inhale 1 puff into the lungs every 6 (Bender) hours as needed for wheezing or shortness of breath.   Yes [provider]  amiodarone (PACERONE) 200 MG tablet Take 200 mg by mouth daily.  01/09/20  Yes [provider]  apixaban (ELIQUIS) 5 MG TABS tablet Take 5 mg by mouth 2 (two) times daily.  01/09/20  Yes [provider]  aspirin EC 81 MG tablet Take 81 mg by mouth daily.   Yes [provider]  atorvastatin (LIPITOR) 20 MG tablet Take 20 mg by mouth daily.   Yes [provider]  cyanocobalamin 1000 MCG tablet Take 1,000 mcg by mouth daily.   Yes [provider]  docusate sodium (COLACE) 100 MG capsule Take 100 mg by mouth 2 (two) times daily.   Yes [provider]  ferrous sulfate 325 (65 FE) MG tablet Take 325 mg by mouth daily.    Yes [provider]  Fluticasone-Salmeterol (ADVAIR) 250-50 MCG/DOSE AEPB Inhale 1 puff into the lungs 2 (two) times daily.   Yes [provider]  gabapentin (NEURONTIN) 300 MG capsule Take 600 mg by mouth 4 (four) times daily.  09/24/19  Yes [provider]  insulin aspart (NOVOLOG FLEXPEN) 100 UNIT/ML FlexPen Inject 5 Units into the skin 3 (three) times daily with meals.    Yes [provider]  insulin detemir (LEVEMIR) 100 UNIT/ML injection Inject 0.05 mLs (5 Units total) into the skin daily. 04/18/20  Yes Max Sane, MD  liraglutide (VICTOZA) 18 MG/3ML SOPN Inject 0.6 mg into the skin daily.  02/27/18  Yes [provider]  Melatonin 3 MG TBDP Take 1 tablet by mouth at bedtime.  01/09/20  Yes [provider]  metoprolol succinate (TOPROL-XL) 25 MG 24 hr tablet Take 12.5 mg by mouth daily.  01/09/20 01/08/21 Yes [provider]  Multiple Vitamin (MULTIVITAMIN WITH MINERALS) TABS tablet Take 1 tablet by mouth daily.   Yes [provider]   oxycodone (OXY-IR) 5 MG capsule Take 5 mg by mouth every 6 (Bender) hours as needed for pain.   Yes [provider]  pantoprazole (PROTONIX) 40 MG tablet Take by mouth. 01/09/20 01/08/21 Yes [provider]  polyethylene glycol powder (GLYCOLAX/MIRALAX) 17 GM/SCOOP powder Take 17 g by mouth daily.  01/09/20  Yes [provider]  potassium chloride SA (KLOR-CON) 20 MEQ tablet Take 1 tablet (20 mEq total) by mouth daily. 04/18/20 05/18/20 Yes Max Sane, MD  sertraline (ZOLOFT) 100 MG tablet Take by mouth. 01/09/20 01/08/21 Yes [provider]  spironolactone (ALDACTONE) 25 MG tablet Take 1 tablet (25 mg total) by mouth daily. 04/18/20 05/18/20 Yes Max Sane, MD  torsemide (DEMADEX) 20 MG tablet Take 2 tablets (40 mg total) by mouth daily. 04/18/20 05/18/20 Yes Max Sane, MD  traMADol (ULTRAM) 50 MG tablet Take 50 mg by mouth every 6 (Bender) hours as needed for moderate pain.    Yes [provider]  zinc sulfate 220 (50 Zn) MG capsule Take 220 mg by mouth daily.  01/09/20 01/08/21 Yes [provider]  dextromethorphan-guaiFENesin (MUCINEX DM) 30-600 MG 12hr tablet Take 1 tablet by mouth 2 (two) times daily as needed for cough. Patient not taking: Reported on 04/25/2020 04/18/20   Max Sane, MD    Critical care time: 45 min.    Montey Hora, [facility] 04/28/2020, 9:10 AM

## 2020-04-28 NOTE — Progress Notes (Signed)
Patient was  Very confused VS turned to yellow for respiration. Discussed patient's condition with charge nurse RN Lexine Baton. Notified on call provider MD G. Shalhoub. Called the rapid response and respiratory in the bed side. Blood gas report was critical notified on call provider. Patient is on 02 HFNC 15l and nonrebreathing 15l continuous. Currently 02 saturation is between 89 to 90. MD G. Shalhoub came to bed side and assessed the patient. Will continue monitor.

## 2020-04-28 NOTE — Progress Notes (Signed)
Assisted tele visit to patient with family members.  Sumi Lye M Jordani Nunn, RN   

## 2020-04-28 NOTE — Significant Event (Addendum)
HOSPITAL MEDICINE OVERNIGHT EVENT NOTE    Notified by nursing that patient was desaturating into the 70's on 5Liters of HFNC . Patient has been increased to 15 liters and is appearing much more comfortable.  Patient is mentating appropriately and denies chest pain.  Continue to monitor.   Julie Emerald  MD [facility]   ADDENDUM 5:30am  Notified by nursing patient is hypoxic once again on 15 liters high flow.  Patient is additionally visibly confused.  Will obtain ABG, CXR and CT head.  Adding on DDimer and BNP to morning labs.    Julie Bender Julie Bender

## 2020-04-28 NOTE — Progress Notes (Signed)
PT Cancellation Note  Patient Details Name: Iliyah Bui MRN: [id number] DOB: March 26, 1961   Cancelled Treatment:    Reason Eval/Treat Not Completed: Medical issues which prohibited therapy patient recently intubated and transferred to ICU. Holding PT for now, will continue to follow acutely and initiate eval when medically appropriate.    Windell Norfolk, DPT, PN1   Supplemental Physical Therapist [facility]    Pager [phone] Acute Rehab Office [phone]

## 2020-04-28 NOTE — Procedures (Signed)
Central Venous Catheter Insertion Procedure Note  Julie Bender  [id number]  1961-03-05  Date:04/28/20  Time:11:56 AM   Provider Performing:Brooke Moshe Cipro   Procedure: Insertion of Non-tunneled Central Venous [phone]) with US guidance (80223)   Indication(s) Medication administration and Difficult access  Consent Risks of the procedure as well as the alternatives and risks of each were explained to the patient and/or caregiver.  Consent for the procedure was obtained and is signed in the bedside chart  Anesthesia Topical only with 1% lidocaine   Timeout Verified patient identification, verified procedure, site/side was marked, verified correct patient position, special equipment/implants available, medications/allergies/relevant history reviewed, required imaging and test results available.  Sterile Technique Maximal sterile technique including full sterile barrier drape, hand hygiene, sterile gown, sterile gloves, mask, hair covering, sterile ultrasound probe cover (if used).  Airborne precautions in place.   Procedure Description Area of catheter insertion was cleaned with chlorhexidine and draped in sterile fashion.  With real-time ultrasound guidance a central venous catheter was placed into the right femoral vein. Nonpulsatile blood flow and easy flushing noted in all ports.  The catheter was sutured in place, biopatch, and sterile dressing applied.   Complications/Tolerance None; patient tolerated the procedure well. Chest x-ray is not ordered for femoral cannulation.  EBL Minimal  Specimen(s) None  Right femoral arterial line attempted with ultrasound without success.    Kennieth Rad, MSN, AGACNP-BC [facility] 04/28/2020, 11:58 AM  See Julie Bender for personal pager PCCM on call pager [phone]

## 2020-04-28 NOTE — Progress Notes (Signed)
Initial Nutrition Assessment  RD working remotely.  DOCUMENTATION CODES:   Obesity unspecified  INTERVENTION:   Once OGT is placed:   Initiate Vital High Protein @ 65 ml/hr (1560 ml/ day)  45 ml Prosource TF daily  Tube feeding regimen provides 1600 kcal (100% of needs), 148 grams of protein, and 1304 ml of H2O.   NUTRITION DIAGNOSIS:   Increased nutrient needs related to acute illness (COVID-19 PNA) as evidenced by estimated needs.  GOAL:   Provide needs based on ASPEN/SCCM guidelines  MONITOR:   Vent status, Labs, Weight trends, TF tolerance, Skin, I & O's  REASON FOR ASSESSMENT:   Consult, Ventilator Enteral/tube feeding initiation and management  ASSESSMENT:   Julie Bender is a 59 y.o. female with Past medical history of HTN, HLD, type II DM, COPD, chronic respiratory failure on 2 LPM, asthma, depression, OSA, ICD implant, chronic HFpEF, HOCM SP myomectomy, CKD stage IIIb, chronic A. fib on Eliquis presents with complaints of shortness of breath.  Pt admitted with acute COVID-19 viral pneumonia.   9/13- intubated due to worsening hypoxia and hypercapnia  Patient is currently intubated on ventilator support MV: 9.3 L/min Temp (24hrs), Avg:98 F (36.7 C), Min:97.6 F (36.4 C), Max:98.7 F (37.1 C)  Reviewed I/O's: +224ml x 24 hours and -118 ml since admission  UOP: 500 ml x 24 hours  MAP: 75  Case discussed with RN; pt does not currently have feeding access. Pt currently awaiting OGT placement (this was attempted earlier, but unsuccessful).   Per MD notes, pt may require proning therapy.   Per chart review, pt with poor oral intake prior to intubation, as it was difficult for her to eat and remove non-rebreather mask for long periods of time. She had been ordered Ensure supplements, which she was consuming, in addition to bites of food or dessert item off of meal tray.   Reviewed wt hx; wt has been stable over the past year.   Palliative care  following for continued goals of care discussions.   Medications reviewed and include remdesivir, vitamin C, colace, vitamin B-12, and zinc sulfate  Lab Results  Component Value Date   HGBA1C 7.4 (H) 04/26/2020   PTA DM medications are 5 units inuslin aspart TID with meals, 5 units insulin detemir daily, and 0.6 mg liraglutide daily. Per ADA's Standards of Medical Care for Diabetes, glycemic targets for elderly patients who are otherwise healthy (few medical impairments) and cognitively intact should be less stringent (Hgb A1c <7.5).   Labs reviewed: CBGS: 71 (inpatient orders for glycemic control are 0-15 units insulin aspart every 4 hours and 20 units insulin detemir BID).   Diet Order:   Diet Order            Diet NPO time specified  Diet effective now                 EDUCATION NEEDS:   Not appropriate for education at this time  Skin:  Skin Assessment: Skin Integrity Issues: Skin Integrity Issues:: Other (Comment), Incisions Incisions: open lt foot Other: Dry gangrene over left fingers (5,4 and 3) and right index finger  Last BM:  04/27/20  Height:   Ht Readings from Last 1 Encounters:  04/28/20 5\' 10"  (1.778 m)    Weight:   Wt Readings from Last 1 Encounters:  04/26/20 95.3 kg    Ideal Body Weight:  68.1 kg  BMI:  Body mass index is 30.13 kg/m.  Estimated Nutritional Needs:   Kcal:  1584  Protein:  135-150 grams  Fluid:  > 1.6 L    Loistine Chance, RD, LDN, [place] Registered Dietitian II Certified Diabetes Care and Education Specialist Please refer to [facility] for RD and/or RD on-call/weekend/after hours pager

## 2020-04-28 NOTE — Procedures (Signed)
Intubation Procedure Note  Julie Bender  [id number]  08-10-1961  Date:04/28/20  Time:7:23 AM   Provider Performing:Chriselda Leppert R [name]    Procedure: Intubation (31500)  Indication(s) Respiratory Failure  Consent Unable to obtain consent due to emergent nature of procedure.   Anesthesia Etomidate and Rocuronium   Time Out Verified patient identification, verified procedure, site/side was marked, verified correct patient position, special equipment/implants available, medications/allergies/relevant history reviewed, required imaging and test results available.   Sterile Technique Usual hand hygeine, masks, and gloves were used   Procedure Description Patient positioned in bed supine.  Sedation given as noted above.  Patient was intubated with endotracheal tube using Glidescope.  View was Grade 2 only posterior commissure .  Number of attempts was 1.  Colorimetric CO2 detector was consistent with tracheal placement.   Complications/Tolerance None; patient tolerated the procedure well. Chest X-ray is ordered to verify placement.   EBL Minimal   Specimen(s) None   Otilio Carpen Daneesha Quinteros, PA-C

## 2020-04-28 NOTE — Significant Event (Signed)
Rapid Response Event Note  Reason for Call : AMS/Hypercarbia  Initial Focused Assessment:  ABG at 0530-7.13/74/48/24  Julie Bender is now obtunded. BVM supporting her weak spontaneous respirations by L. Gleason PA. Pt intubated by L. Gleason PA (see note). Pt transferred to 9T66 without complication.   0705- HR 93, 101/77 (87), RR 24 with sats 93% on 100% FIO2   Interventions:  -Intubated per PCCM -Etomidate 10 mg IV, Rocuronium 100 mcg IV -2 PIV -tx to 3M13   MD Notified: At bedside Call Time: [place] Time: 0600 End Time: 0800  Madelynn Done, RN

## 2020-04-28 NOTE — Progress Notes (Signed)
Pharmacy Antibiotic Note  Julie Bender is a 59 y.o. female admitted on 04/21/2020 with SOB in setting of Covid-19. Pharmacy has been consulted for vancomycin and cefepime dosing for sepsis.  Patient has AoC CKD - SCr up to 2.82, CrCL 26 ml/min, afebrile, WBC up 13.9, PCT 0.67.  Plan: Vanc 1750mg  IV x 1, then 1gm IV Q24H for goal trough 15-20  mcg/mL Cefepime 2gm IV Q24H Monitor renal fxn, clinical progress, vanc level as indicated  Height: 5\' 10"  (177.8 cm) Weight: 95.3 kg (210 lb) IBW/kg (Calculated) : 68.5  Temp (24hrs), Avg:98 F (36.7 C), Min:97.6 F (36.4 C), Max:98.7 F (37.1 C)  Recent Labs  Lab 05/09/2020 2056 04/25/20 0203 04/26/20 1109 04/27/20 0359 04/28/20 0440  WBC 6.6 6.1 4.5 8.0 13.9*  CREATININE 1.67* 1.65* 2.21* 2.34* 2.82*    Estimated Creatinine Clearance: 26.2 mL/min (A) (by C-G formula based on SCr of 2.82 mg/dL (H)).    No Known Allergies  Remdesivir 9/10 >> (9/14) SM 9/10 x3 days then pred 50/d >>  Vanc 9/13 >> Cefepime 9/13 >>  9/13 TA - 9/13 MRSA PCR -  9/13 BCx -   Julie Bender, PharmD, BCPS, [place] 04/28/2020, 10:21 AM

## 2020-04-28 NOTE — Progress Notes (Signed)
HOSPITAL MEDICINE OVERNIGHT EVENT NOTE     Review of ABG was very concerning revealing pH of 7.13, pCO2 of 73.9 and pO2 of 47.9 on 15L high flow [place] with 100% nonrebreather.    I went to evaluate the patient at the bedside and found the patient to be essentially obtunded, not responding to sternal rub with poor inspiratory effort.  I felt that transfer to high level of care and intubation were necessary.  I discussed Case with CCM and voiced my concern that the patient will likely need intubation.  I then called the patient's sister, Kennyth Lose, the patients surrogate decision maker.  I verified the patient is DNR WITH INTUBATION and that she is agreeable to mechanical ventilation if necessary.  I spoke to rapid response and the ground CCM team and in coordination with anesthesia the patient will be intubated on the floor and transferred to the ICU at this time.   Vernelle Emerald  MD [facility]

## 2020-04-28 NOTE — Progress Notes (Signed)
Inpatient Diabetes Program Recommendations  AACE/ADA: New Consensus Statement on Inpatient Glycemic Control   Target Ranges:  Prepandial:   less than 140 mg/dL      Peak postprandial:   less than 180 mg/dL (1-2 hours)      Critically ill patients:  140 - 180 mg/dL  Results for Julie Bender, Julie Bender (MRN [id number]) as of 04/28/2020 14:07  Ref. Range 04/28/2020 05:03 04/28/2020 09:05 04/28/2020 12:37 04/28/2020 13:16  Glucose-Capillary Latest Ref Range: 70 - 99 mg/dL 133 (H) 71 65 (L) 81   Results for Julie Bender, Julie Bender (MRN [id number]) as of 04/28/2020 14:07  Ref. Range 04/27/2020 07:47 04/27/2020 12:56 04/27/2020 16:20 04/27/2020 21:13  Glucose-Capillary Latest Ref Range: 70 - 99 mg/dL 114 (H)  Novolog 4 units  Levemir 20 units  Tradjenta 5 mg 82 78 154 (H)     Levemir 20 units   Review of Glycemic Control  Current orders for Inpatient glycemic control: Levemir 20 units BID, Novolog 0-15 units Q4H; Prednisone 50 mg daily, Vital @ 45 ml/hr  Inpatient Diabetes Program Recommendations:    Insulin: Over the past 24 hours, patient was intubated, steroids decreased, Tradjenta discontinued, and has been hypoglycemic. No Levemir given this morning and glucose noted to be 65 mg/dl at 12:37 today. Please consider decreasing Levemir to 15 units BID.   Thanks, Barnie Alderman, RN, MSN, CDE Diabetes Coordinator Inpatient Diabetes Program [phone] (Team Pager from 8am to 5pm)

## 2020-04-29 DIAGNOSIS — U071 COVID-19: Secondary | ICD-10-CM | POA: Diagnosis not present

## 2020-04-29 LAB — POCT I-STAT 7, (LYTES, BLD GAS, ICA,H+H)
Acid-base deficit: 1 mmol/L (ref 0.0–2.0)
Acid-base deficit: 1 mmol/L (ref 0.0–2.0)
Acid-base deficit: 3 mmol/L — ABNORMAL HIGH (ref 0.0–2.0)
Bicarbonate: 27 mmol/L (ref 20.0–28.0)
Bicarbonate: 27 mmol/L (ref 20.0–28.0)
Bicarbonate: 23.8 mmol/L (ref 20.0–28.0)
Calcium, Ion: 1.18 mmol/L (ref 1.15–1.40)
Calcium, Ion: 1.18 mmol/L (ref 1.15–1.40)
Calcium, Ion: 1.17 mmol/L (ref 1.15–1.40)
HCT: 34 % — ABNORMAL LOW (ref 36.0–46.0)
HCT: 34 % — ABNORMAL LOW (ref 36.0–46.0)
HCT: 35 % — ABNORMAL LOW (ref 36.0–46.0)
Hemoglobin: 11.6 g/dL — ABNORMAL LOW (ref 12.0–15.0)
Hemoglobin: 11.6 g/dL — ABNORMAL LOW (ref 12.0–15.0)
Hemoglobin: 11.9 g/dL — ABNORMAL LOW (ref 12.0–15.0)
O2 Saturation: 56 %
O2 Saturation: 56 %
O2 Saturation: 82 %
Patient temperature: 98.5
Patient temperature: 98.5
Patient temperature: 98.4
Potassium: 4.9 mmol/L (ref 3.5–5.1)
Potassium: 4.9 mmol/L (ref 3.5–5.1)
Potassium: 4.4 mmol/L (ref 3.5–5.1)
Sodium: 138 mmol/L (ref 135–145)
Sodium: 138 mmol/L (ref 135–145)
Sodium: 137 mmol/L (ref 135–145)
TCO2: 29 mmol/L (ref 22–32)
TCO2: 29 mmol/L (ref 22–32)
TCO2: 25 mmol/L (ref 22–32)
pCO2 arterial: 58.2 mmHg — ABNORMAL HIGH (ref 32.0–48.0)
pCO2 arterial: 58.2 mmHg — ABNORMAL HIGH (ref 32.0–48.0)
pCO2 arterial: 48.9 mmHg — ABNORMAL HIGH (ref 32.0–48.0)
pH, Arterial: 7.274 — ABNORMAL LOW (ref 7.350–7.450)
pH, Arterial: 7.274 — ABNORMAL LOW (ref 7.350–7.450)
pH, Arterial: 7.295 — ABNORMAL LOW (ref 7.350–7.450)
pO2, Arterial: 34 mmHg — CL (ref 83.0–108.0)
pO2, Arterial: 34 mmHg — CL (ref 83.0–108.0)
pO2, Arterial: 51 mmHg — ABNORMAL LOW (ref 83.0–108.0)

## 2020-04-29 LAB — COMPREHENSIVE METABOLIC PANEL
ALT: 531 U/L — ABNORMAL HIGH (ref 0–44)
AST: 1744 U/L — ABNORMAL HIGH (ref 15–41)
Albumin: 3.5 g/dL (ref 3.5–5.0)
Alkaline Phosphatase: 168 U/L — ABNORMAL HIGH (ref 38–126)
Anion gap: 16 — ABNORMAL HIGH (ref 5–15)
BUN: 74 mg/dL — ABNORMAL HIGH (ref 8–23)
CO2: 23 mmol/L (ref 22–32)
Calcium: 9.3 mg/dL (ref 8.9–10.3)
Chloride: 95 mmol/L — ABNORMAL LOW (ref 98–111)
Creatinine, Ser: 2.99 mg/dL — ABNORMAL HIGH (ref 0.44–1.00)
GFR calc Af Amer: 19 mL/min — ABNORMAL LOW (ref >60)
GFR calc non Af Amer: 16 mL/min — ABNORMAL LOW (ref >60)
Glucose, Bld: 235 mg/dL — ABNORMAL HIGH (ref 70–99)
Potassium: 4.2 mmol/L (ref 3.5–5.1)
Sodium: 134 mmol/L — ABNORMAL LOW (ref 135–145)
Total Bilirubin: 4 mg/dL — ABNORMAL HIGH (ref 0.3–1.2)
Total Protein: 6.4 g/dL — ABNORMAL LOW (ref 6.5–8.1)

## 2020-04-29 LAB — CBC WITH DIFFERENTIAL/PLATELET
Abs Immature Granulocytes: 0 10*3/uL (ref 0.00–0.07)
Basophils Absolute: 0 10*3/uL (ref 0.0–0.1)
Basophils Relative: 0 %
Eosinophils Absolute: 0 10*3/uL (ref 0.0–0.5)
Eosinophils Relative: 0 %
HCT: 25 % — ABNORMAL LOW (ref 36.0–46.0)
Hemoglobin: 7.9 g/dL — ABNORMAL LOW (ref 12.0–15.0)
Lymphocytes Relative: 1 %
Lymphs Abs: 0.1 10*3/uL — ABNORMAL LOW (ref 0.7–4.0)
MCH: 24.6 pg — ABNORMAL LOW (ref 26.0–34.0)
MCHC: 31.6 g/dL (ref 30.0–36.0)
MCV: 77.9 fL — ABNORMAL LOW (ref 80.0–100.0)
Monocytes Absolute: 0.1 10*3/uL (ref 0.1–1.0)
Monocytes Relative: 1 %
Neutro Abs: 13.7 10*3/uL — ABNORMAL HIGH (ref 1.7–7.7)
Neutrophils Relative %: 98 %
Platelets: 187 10*3/uL (ref 150–400)
RBC: 3.21 MIL/uL — ABNORMAL LOW (ref 3.87–5.11)
RDW: 22.6 % — ABNORMAL HIGH (ref 11.5–15.5)
WBC: 14 10*3/uL — ABNORMAL HIGH (ref 4.0–10.5)
nRBC: 5.7 % — ABNORMAL HIGH (ref 0.0–0.2)
nRBC: 6 /100 WBC — ABNORMAL HIGH

## 2020-04-29 LAB — TRIGLYCERIDES: Triglycerides: 134 mg/dL (ref <150)

## 2020-04-29 LAB — GLUCOSE, CAPILLARY
Glucose-Capillary: 192 mg/dL — ABNORMAL HIGH (ref 70–99)
Glucose-Capillary: 244 mg/dL — ABNORMAL HIGH (ref 70–99)
Glucose-Capillary: 278 mg/dL — ABNORMAL HIGH (ref 70–99)
Glucose-Capillary: 346 mg/dL — ABNORMAL HIGH (ref 70–99)
Glucose-Capillary: 363 mg/dL — ABNORMAL HIGH (ref 70–99)
Glucose-Capillary: 388 mg/dL — ABNORMAL HIGH (ref 70–99)

## 2020-04-29 LAB — PHOSPHORUS
Phosphorus: 4.7 mg/dL — ABNORMAL HIGH (ref 2.5–4.6)
Phosphorus: 4.5 mg/dL (ref 2.5–4.6)

## 2020-04-29 LAB — C-REACTIVE PROTEIN: CRP: 8.8 mg/dL — ABNORMAL HIGH (ref <1.0)

## 2020-04-29 LAB — HEPARIN LEVEL (UNFRACTIONATED): Heparin Unfractionated: >2.2 IU/mL — ABNORMAL HIGH (ref 0.30–0.70)

## 2020-04-29 LAB — FERRITIN: Ferritin: 1373 ng/mL — ABNORMAL HIGH (ref 11–307)

## 2020-04-29 LAB — PROCALCITONIN: Procalcitonin: 2.91 ng/mL

## 2020-04-29 LAB — APTT: aPTT: 53 seconds — ABNORMAL HIGH (ref 24–36)

## 2020-04-29 LAB — MAGNESIUM: Magnesium: 2 mg/dL (ref 1.7–2.4)

## 2020-04-29 MED ORDER — HEPARIN (PORCINE) 25000 UT/250ML-% IV SOLN
1050.0000 [IU]/h | INTRAVENOUS | Status: DC
  Administered 2020-04-29: 1050 [IU]/h via INTRAVENOUS
  Filled 2020-04-29: qty 250

## 2020-04-29 MED ORDER — MIDAZOLAM 50MG/50ML (1MG/ML) PREMIX INFUSION
0.0000 mg/h | INTRAVENOUS | Status: DC
  Administered 2020-04-29 – 2020-04-30 (×2): 2 mg/h via INTRAVENOUS
  Filled 2020-04-29 (×2): qty 50

## 2020-04-29 MED ORDER — INSULIN ASPART 100 UNIT/ML ~~LOC~~ SOLN
2.0000 [IU] | SUBCUTANEOUS | Status: DC
  Administered 2020-04-29 – 2020-04-30 (×8): 2 [IU] via SUBCUTANEOUS

## 2020-04-29 MED ORDER — PANTOPRAZOLE SODIUM 40 MG PO PACK
40.0000 mg | PACK | Freq: Every day | ORAL | Status: DC
  Administered 2020-04-30: 40 mg
  Filled 2020-04-29: qty 20

## 2020-04-29 NOTE — Progress Notes (Signed)
NAME:  Julie Bender, MRN:  [id number], DOB:  14-Aug-1961, LOS: 4 ADMISSION DATE:  04/29/2020, CONSULTATION DATE:  04/28/20 REFERRING MD: Cyd Silence   CHIEF COMPLAINT:  Respiratory failure   Brief History   Julie Bender is a 59 y.o. female from SNF who was admitted 9/10 with worsening dyspnea and hypoxia 2/2 COVID PNA.  She had worsening hypoxia and hypercapnia early AM 9/13 and required intubation.  History of present illness   Pt is encephelopathic; therefore, this HPI is obtained from chart review. Julie Bender is a 59 y.o. female who has a PMH as outlined below.  She presented to [facility] ED 9/10 with worsening dyspnea and hypoxia.  Had been on 15L O2 while at SNF but continued to complain of SOB; therefore, EMS was called.  In ED, oxygenation gradually improved and she was weaned to 4L.  COVID returned positive.  She was started on remdesivir, solumedrol, baricitinib.  She was seen by PMT who confirmed DNR / DNI with continued supportive medical therapies otherwise.  Early AM 9/13 around 0220 AM, she had desaturation into 70s despite 5L HFNC.  She was increased to 15L with improvement.  Around 0530, she was hypoxic again and also had new confusion. ABG revealed acute hypoxic and hypercapnic respiratory failure (8.13 / 74 / 48).  She progressed to the point of obtundation.  Rapid response and PCCM called emergently.  Case discussed by overnight team with family who opted to go ahead with intubation but otherwise continue with DNR in the event of arrest.  She was subsequently intubated and was transferred to [facility] ICU for ongoing care.  Of note, she had recent admission to [facility] 8/15 through 9/3 for AoC hypoxic respiratory failure and CHF exacerbation.  She was discharged to SNF.  Past Medical History  has SOB (shortness of breath); Depression; Elevated troponin; Acute on chronic respiratory failure with hypoxia ([place]); Obstructive sleep apnea; Morbid obesity ([place]); Chronic heart failure with preserved  ejection fraction ([place]); End stage renal disease on dialysis [facility]); Chronic obstructive asthma ([place]); Acute on chronic diastolic CHF (congestive heart failure) ([place]); COPD (chronic obstructive pulmonary disease) ([place]); Hypertension; Type II diabetes mellitus with renal manifestations ([place]); ESRD (end stage renal disease) ([place]); CKD (chronic kidney disease), stage IIIa; Atrial fibrillation, chronic ([place]); Anemia due to chronic kidney disease; 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  Significant Hospital Events   9/10 > admit. 9/13 > intubated and transferred to ICU.  Consults:  PMT.  Procedures:  ETT 9/13 >  R femoral CVL 9/13 >   Significant Diagnostic Tests:  CXR 9/13 > patchy infiltrates, vascular congestion.  Micro Data:  COVID 9/10 > positive.  Antimicrobials:  Cefepime 9/13 >  Vanc 9/13 >   Interim history/subjective:  NAEON. 9/14 Labs pending.  Objective:  Blood pressure (!) 124/52, pulse 79, temperature 98.4 F (36.9 C), temperature source Oral, resp. rate (!) 26, height 5\' 10"  (1.778 m), weight 97 kg, SpO2 99 %.    Vent Mode: PRVC FiO2 (%):  [50 %-100 %] 50 % Set Rate:  [24 bmp] 24 bmp Vt Set:  [410 mL] 410 mL PEEP:  [10 cmH20-12 cmH20] 10 cmH20 Plateau Pressure:  [27 cmH20-32 cmH20] 27 cmH20   Intake/Output Summary (Last 24 hours) at 04/29/2020 4034 Last data filed  at 04/29/2020 0500 Gross per 24 hour  Intake 2191.37 ml  Output 70 ml  Net 2121.37 ml   Filed Weights   04/26/20 2303 04/28/20 1845 04/29/20 0500  Weight: 95.3 kg 95.9 kg 97 kg    Examination: General: Adult female, chronically ill appearing. Neuro: Sedated, non-responsive. HEENT: [place]/AT. Sclerae anicteric.  ETT  in place. Cardiovascular: RRR, no M/R/G.  Lungs: Respirations even and unlabored.  CTA bilaterally, No W/R/R.  Abdomen: BS x 4, soft, NT/ND.  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  1+ pitting edema BLE's.  Skin: Dry gangrene as above.  Otherwise warm, no rashes.  Assessment & Plan:   Acute on chronic hypoxic with acute hypercapnic respiratory failure - s/p emergent intubation AM 9/13. Hx COPD per report. - Continue full vent support. - ABG now. - Bronchial hygiene. - Would like to diurese more but holding for now given bump in SCr. - Continue BD's. - Follow CXR.  COVID PNA with concern for superimposed bacterial infection. - Continue remdesivir, solumedrol, baricitinib. - Continue LTV with goal Pplat < 30, Pdriving < 15. - ABG now. - Continue empiric vanc / cefepime. - Check PCT.  Hx dCHF, A.fib, HTN. - Holding diuresis given bump in SCr. - Continue home Eliquis, amiodarone, ASA. - Hold home atoravstatin given transaminitis.  AoCKD. - Supportive care. - Follow BMP.  Hx DM. - SSI. - Start TF's. - Home home meds.  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 - Supportive care.  Transaminitis - presumed 2/2 COVID. - Trend LFT's.  Hx Depression. - Hold home sertraline.  Goals of care - PMT notes reviewed, DNR / DNI had been in place; however, upon decompensation early AM 9/13, sister Kennyth Lose informed team that while she was indeed DNR, she would still opt for intubation. - Continue supportive care as above with DNR orders in place in the event of arrest. - PMT re-engaged 9/13.  AM labs 9/14 all pending.  Will follow.   Best Practice:  Diet: TFs. Pain/Anxiety/Delirium protocol (if indicated): Propofol gtt / Fentanyl gtt.  RASS goal -1. VAP protocol (if indicated): In place. DVT prophylaxis: SCD's / Apixaban. GI prophylaxis: PPI. Glucose control: SSI. Mobility: Bedrest. Code Status: DNR. Family  Communication: Will call sister this PM. Disposition: ICU.   Critical care time: 35 min.    Montey Hora, [facility] 04/29/2020, 8:36 AM

## 2020-04-29 NOTE — Progress Notes (Signed)
Palliative Care Family Meeting  Family conference held with patient's sister HCPOA Julie Bender (1st), HCPOA Julie Bender (2nd) and patient's mother on the phone. Palliative care has seen Julie Bender earlier in this hospitalization and on previous admissions.Her goals of care are accessible and clear in the ACP link and well documented. She has also had thoughtful conversations with her family about her wishes. She had a MOST form recently completed with one of our palliative providers that indicated DNR/DNI and in the event of cardiac or respiratory arrest to provide comfort measures. Her poor functional status prior to admission was also well documented-she was bedbound and has been in and out of skilled nursing facilities since earlier this year.   Patient began to decompensate rapidly in the early morning hours on 9/13. Family was contacted and told that she needed to be intubated and put on mechanical ventilation and her HCPOA consented to this. Her MOST form, active code status order and prior palliative documentation all documented a patient preference for DNR/DNI/Comfort Measures/No ICU in the event of a cardiac or respiratory arrest. She became increasingly obtunded and severely hypoxic. She was subsequently intubated, lined and transferred to the ICU around [place] on 9/13.  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 She has declining ABGs, very elevated LFTs and progressive multi-organ failure-CCM team considering proning based on vent requirements and progressive worsening of her condition. Goals discussion requested prior to continuing this aggressive course as it does not seem to be in line with her previously stated wishes and her chances of surviving are very  poor.  I updated family and they understand how seriously ill she is and that she is anticipated to have a hospital death due to complications of her chronic illness and covid infection. She remains fragile even with vent support in place and aggressive medical interventions.  Summary:  1. Patient is DNR 2. Will proceed with extubation to comfort measures as the compassionate and humane way to care for this patient- her family are gathering now and will visit her based on the EOL Covid + guidelines for visitors- once family have had a chance to see her will proceed with terminal extubation and CMO. Her other family are in route from out of town. I have instructed them to work with nursing to determine length and frequency of visit. 3. Do not escalate interventions, provide comfort measures while on the ventilator until family can arrive and CMO transition made. 4. Use Fentanyl and Propofol for pain control and sedation.  Lane Hacker, DO Palliative Medicine [phone]  Time: 70 minutes Greater than 50%  of this time was spent counseling and coordinating care related to the above assessment and plan.

## 2020-04-29 NOTE — Progress Notes (Signed)
RT increased PEEP TO 12 and FiO2 TO 80% per Rahul, PA due to ABG results. RT will continue to monitor.

## 2020-04-29 NOTE — Progress Notes (Signed)
PT Cancellation Note  Patient Details Name: Sarafina Puthoff MRN: [id number] DOB: 1960-11-23   Cancelled Treatment:    Reason Eval/Treat Not Completed: Medical issues which prohibited therapy.  Will sign off per MD, pt's status is declining and proceeding to proning.   04/29/2020  Ginger Carne., PT Acute Rehabilitation Services [phone]  (pager) [phone]  (office)   Tessie Fass Kasean Denherder 04/29/2020, 10:26 AM

## 2020-04-29 NOTE — Progress Notes (Signed)
ANTICOAGULATION CONSULT NOTE - Initial Consult  Pharmacy Consult:  Eliquis > Heparin Indication: atrial fibrillation  No Known Allergies  Patient Measurements: Height: 5\' 10"  (177.8 cm) Weight: 97 kg (213 lb 13.5 oz) IBW/kg (Calculated) : 68.5 Heparin Dosing Weight: 89 kg  Vital Signs: Temp: 96.9 F (36.1 C) (09/14 0802) Temp Source: Axillary (09/14 0802) BP: 110/83 (09/14 1230) Pulse Rate: 75 (09/14 1230)  Labs: Recent Labs    04/27/20 0359 04/27/20 0359 04/28/20 0440 04/28/20 0935 04/28/20 1149 04/28/20 1149 04/29/20 0811 04/29/20 0902  HGB 8.0*   < > 9.1*   < > 10.5*   < > 7.9* 11.9*  HCT 25.3*   < > 28.7*   < > 31.0*  --  25.0* 35.0*  PLT 176  --  239  --   --   --  187  --   CREATININE 2.34*  --  2.82*  --   --   --  2.99*  --    < > = values in this interval not displayed.    Estimated Creatinine Clearance: 24.9 mL/min (A) (by C-G formula based on SCr of 2.99 mg/dL (H)).   Medical History: Past Medical History:  Diagnosis Date  . Acute on chronic respiratory failure with hypoxia ([place])   . Asthma   . CHF (congestive heart failure) ([place])   . Chronic heart failure with preserved ejection fraction ([place])   . Chronic obstructive asthma ([place])   . COPD (chronic obstructive pulmonary disease) ([place])   . Diabetes mellitus without complication ([place])   . Diabetic retinopathy ([place])   . End stage renal disease on dialysis ([place])   . History of placement of internal cardiac defibrillator   . Hypertension   . Hypertrophic cardiomyopathy ([place])   . Hypertrophic cardiomyopathy ([place])   . Morbid obesity ([place])   . Obstructive sleep apnea      Assessment: 36 YOF with history of Afib on Eliquis PTA and continued inpatient.  Now with AKI and shock liver, so Pharmacy consulted to transition patient to IV heparin.  Last dose of Eliquis 5mg  today around 0915.  No bleeding reported.  Goal of Therapy:  Heparin level 0.3-0.7 units/ml aPTT 66-102 seconds Monitor platelets by  anticoagulation protocol: Yes   Plan:  Check baseline aPTT and heparin level before starting heparin Heparin gtt at 1050 units/hr Check 8 hr aPTT Daily heparin level, aPTT and CBC  Maninder Deboer D. Mina Marble, PharmD, BCPS, [place] 04/29/2020, 1:55 PM

## 2020-04-29 NOTE — Progress Notes (Signed)
PCCM Interval Progress Note  Discussed with Dr. Hilma Favors of PMT who had extensive family discussion this afternoon. Decision made to respect pt's wishes and not continue with aggressive / heroic measures given poor prognosis. Family coming in for end of life visitation this afternoon and after that, will proceed with comfort measures.   Montey Hora, [facility] 04/29/2020, 3:03 PM

## 2020-04-30 ENCOUNTER — Ambulatory Visit: Payer: Medicare Other | Admitting: Family

## 2020-04-30 DIAGNOSIS — I96 Gangrene, not elsewhere classified: Secondary | ICD-10-CM

## 2020-04-30 DIAGNOSIS — R7401 Elevation of levels of liver transaminase levels: Secondary | ICD-10-CM

## 2020-04-30 LAB — CULTURE, RESPIRATORY W GRAM STAIN

## 2020-04-30 LAB — GLUCOSE, CAPILLARY
Glucose-Capillary: 363 mg/dL — ABNORMAL HIGH (ref 70–99)
Glucose-Capillary: 277 mg/dL — ABNORMAL HIGH (ref 70–99)
Glucose-Capillary: 253 mg/dL — ABNORMAL HIGH (ref 70–99)

## 2020-04-30 MED ORDER — ACETAMINOPHEN 325 MG PO TABS: 650.0000 mg | ORAL_TABLET | Freq: Four times a day (QID) | ORAL | Status: DC | PRN

## 2020-04-30 MED ORDER — GLYCOPYRROLATE 0.2 MG/ML IJ SOLN: 0.2000 mg | INTRAMUSCULAR | Status: DC | PRN

## 2020-04-30 MED ORDER — DIPHENHYDRAMINE HCL 50 MG/ML IJ SOLN: 25.0000 mg | INTRAMUSCULAR | Status: DC | PRN

## 2020-04-30 MED ORDER — SODIUM CHLORIDE 0.9% FLUSH
10.0000 mL | Freq: Two times a day (BID) | INTRAVENOUS | Status: DC
  Administered 2020-04-30: 10 mL

## 2020-04-30 MED ORDER — POLYVINYL ALCOHOL 1.4 % OP SOLN
1.0000 [drp] | Freq: Four times a day (QID) | OPHTHALMIC | Status: DC | PRN
  Filled 2020-04-30: qty 15

## 2020-04-30 MED ORDER — ACETAMINOPHEN 650 MG RE SUPP: 650.0000 mg | Freq: Four times a day (QID) | RECTAL | Status: DC | PRN

## 2020-04-30 MED ORDER — SODIUM CHLORIDE 0.9% FLUSH: 10.0000 mL | INTRAVENOUS | Status: DC | PRN

## 2020-04-30 MED ORDER — GLYCOPYRROLATE 1 MG PO TABS: 1.0000 mg | ORAL_TABLET | ORAL | Status: DC | PRN

## 2020-04-30 MED ORDER — INSULIN DETEMIR 100 UNIT/ML ~~LOC~~ SOLN
30.0000 [IU] | Freq: Two times a day (BID) | SUBCUTANEOUS | Status: DC
  Filled 2020-04-30: qty 0.3

## 2020-05-03 LAB — CULTURE, BLOOD (ROUTINE X 2)
Culture: NO GROWTH
Culture: NO GROWTH

## 2020-05-16 NOTE — Progress Notes (Signed)
NAME:  Julie Bender, MRN:  [id number], DOB:  06-Jan-1961, LOS: 5 ADMISSION DATE:  05/10/2020, CONSULTATION DATE:  04/28/20 REFERRING MD: Cyd Silence   CHIEF COMPLAINT:  Respiratory failure   Brief History   Julie Bender is a 59 y.o. female from SNF who was admitted 9/10 with worsening dyspnea and hypoxia 2/2 COVID PNA.  She had worsening hypoxia and hypercapnia early AM 9/13 and required intubation.  History of present illness   Pt is encephelopathic; therefore, this HPI is obtained from chart review. Julie Bender is a 59 y.o. female who has a PMH as outlined below.  She presented to [facility] ED 9/10 with worsening dyspnea and hypoxia.  Had been on 15L O2 while at SNF but continued to complain of SOB; therefore, EMS was called.  In ED, oxygenation gradually improved and she was weaned to 4L.  COVID returned positive.  She was started on remdesivir, solumedrol, baricitinib.  She was seen by PMT who confirmed DNR / DNI with continued supportive medical therapies otherwise.  Early AM 9/13 around 0220 AM, she had desaturation into 70s despite 5L HFNC.  She was increased to 15L with improvement.  Around 0530, she was hypoxic again and also had new confusion. ABG revealed acute hypoxic and hypercapnic respiratory failure (8.13 / 74 / 48).  She progressed to the point of obtundation.  Rapid response and PCCM called emergently.  Case discussed by overnight team with family who opted to go ahead with intubation but otherwise continue with DNR in the event of arrest.  She was subsequently intubated and was transferred to [facility] ICU for ongoing care.  Of note, she had recent admission to [facility] 8/15 through 9/3 for AoC hypoxic respiratory failure and CHF exacerbation.  She was discharged to SNF.  Past Medical History  has SOB (shortness of breath); Depression; Elevated troponin; Acute on chronic respiratory failure with hypoxia ([place]); Obstructive sleep apnea; Morbid obesity ([place]); Chronic heart failure with preserved  ejection fraction ([place]); End stage renal disease on dialysis [facility]); Chronic obstructive asthma ([place]); Acute on chronic diastolic CHF (congestive heart failure) (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  Significant Hospital Events   9/10 > admit. 9/13 > intubated and transferred to ICU.  Consults:  PMT.  Procedures:  ETT 9/13 >  R femoral CVL 9/13 >   Significant Diagnostic Tests:  CXR 9/13 > patchy infiltrates, vascular congestion.  Micro Data:  COVID 9/10 > positive. Trach aspirate 9/13> abundant GPR Blood cx NGTD  Antimicrobials:  Cefepime 9/13 >  Vanc 9/13 >   Interim history/subjective:  Family coming in for end of life visit today.  Objective:  Blood pressure (!) 113/59, pulse 75, temperature 97.6 F (36.4 C), temperature source Oral, resp. rate (!) 25, height 5\' 10"  (1.778 m), weight 100.3 kg, SpO2 91 %.    Vent Mode: PRVC FiO2 (%):  [90 %-100 %] 90 % Set Rate:  [24 bmp] 24 bmp Vt Set:  [410 mL] 410 mL PEEP:  [14 cmH20] 14 cmH20 Plateau Pressure:  [32 cmH20-33 cmH20] 33 cmH20  Intake/Output Summary (Last 24 hours) at 05-29-20 1142 Last data filed at May 29, 2020 1000 Gross per 24 hour  Intake 3328.57 ml  Output --  Net 3328.57 ml   Filed Weights   04/28/20 1845 04/29/20 0500 May 29, 2020 0220  Weight: 95.9 kg 97 kg 100.3 kg    Examination: General: critically ill woman laying in bed intubated,  sedated Neuro: RASS -4, no response to verbal stimulation. Pinpoint pupils. Breathing above the vent. HEENT: [place]/AT, mild R epistaxis. ETT & OGT. Cardiovascular:  RRR, no murmurs Lungs: Mild vent dyssynchrony, tachypneic breathing above the vent. [place]ales bilaterally.  Abdomen: obese, soft, NT, ND  Musculoskeletal: dry gangrene of fingers distally. No toes due to previous amputations. Pitting edema BLE. Skin: no rashes, dry gangrene of fingers  Assessment & Plan:   Acute on chronic hypoxic with acute hypercapnic respiratory failure - s/p emergent intubation AM 9/13. Hx COPD per report. COVID PNA with concern for superimposed bacterial infection -- elevated PCT, tracheal secretions. - Continue full vent support for now- LTVV Vt 4-8cc/kg IBW with goal plat <30 and driving pressure <91. Planning on terminal extubation and comfort care later today after family visitation. -VAP prevention protocol. -PAD protocol for sedation. -no NMB given plans for extubation later. - Continue solumedrol. Holding remdesivir due to elevated LFTs - Continue LTV with goal Pplat < 30, Pdriving < 15. - ABG now. - Continue empiric vanc & cefepime.  Hx dCHF, A.fib, HTN. - Holding diuresis given bump in SCr. - Continue home Eliquis, amiodarone, ASA. - Hold home atoravstatin given transaminitis.  AKI on CKD 3b -renally dose meds and avoid nephrotoxic meds -strict I/Os -serial BMPs  Hx DM, uncontrolled hyperglycemia -basal bolus insulin- increasing doses today -SSI Q4h PRN -goal BG 140-180 while admitted to ICU -holding PTA meds.  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 - Supportive care.  Elevated LFTs, presume shock liver versus sepsis due to presumed pneumonia vs due to covid. - Trend LFT's. -hold remdesivir  Hx Depression. - Hold home sertraline.  Goals of care - PMT notes reviewed, DNR / DNI had been in place; however, upon decompensation early AM 9/13, sister Kennyth Lose informed  team that while she was indeed DNR, she would still opt for intubation. - Continue supportive care as above. DNR appropriate for her. -appreciate palliative care team's assistance    Best Practice:  Diet: TFs. Pain/Anxiety/Delirium protocol (if indicated): Propofol gtt / Fentanyl gtt.  RASS goal -1. VAP protocol (if indicated): In place. DVT prophylaxis: SCD's / Apixaban. GI prophylaxis: PPI. Glucose control: SSI. Mobility: Bedrest. Code Status: DNR. Family Communication: family visit today Disposition: ICU.    This patient is critically ill with multiple organ system failure which requires frequent high complexity decision making, assessment, support, evaluation, and titration of therapies. This was completed through the application of advanced monitoring technologies and extensive interpretation of multiple databases. During this encounter critical care time was devoted to patient care services described in this note for 32 minutes.  Julian Hy, DO May 29, 2020 12:02 PM [facility]

## 2020-05-16 NOTE — Progress Notes (Signed)
Patient's time of death was 1758 on 05/16/20. Geroge Baseman, RN and Neta Mends, RN at bedside. Family was also in a video conference call during time of death.   Dr. Carlis Abbott was made aware of time of death and will be signing the death certificate.

## 2020-05-16 NOTE — Progress Notes (Signed)
Pt referred to [facility]. Referral # [id number]. Spoke with [employer]. Coordinator Rexanne Mano will be contacting bedside RN Vivien Rota shortly.

## 2020-05-16 NOTE — Progress Notes (Signed)
Patient was compassionately extubated per MD order/family wishes at 1740.

## 2020-05-16 NOTE — Death Summary Note (Signed)
Physician Discharge Summary  Patient ID: Cortni Tays MRN: [id number] DOB/AGE: Jan 10, 1961 59 y.o.  Admit date: 04/21/2020 Discharge date: 09-May-2020  Admission Diagnoses: Acute Covid- 19 viral pneumonia Acute hypoxic respiratory failure Acute on chronic diastolic heart failure Digital ischemia with dry gangrene Atrial fibrillation Dry gangrene of multiple digits Depression Essential hypertension AKI on CKD 3B Type 2 diabetes with renal complication   Discharge Diagnoses:  Principal Problem:   COVID-19 Active Problems:   Depression   Acute on chronic respiratory failure with hypoxia (HCC)   Obstructive sleep apnea   Morbid obesity (HCC)   Acute on chronic diastolic CHF (congestive heart failure) ([place])   Hypertension   Type II diabetes mellitus with renal manifestations (HCC)   Atrial fibrillation, chronic (HCC)   Anemia due to chronic kidney disease   Acute kidney injury superimposed on chronic kidney disease IIIb (HCC)   Thrombocytopenia (HCC)   Elevated LFTs   Encounter for intubation Digital ischemia with dry gangrene  Discharged Condition: deceased  Hospital Course: Mrs. Lily Peer is a 12 year old woman admitted to the hospital with COVID-19 viral pneumonia.  She had recently had a prolonged hospitalization for cardiogenic shock at [facility].  She was discharged to a skilled nursing facility.  During her admission she developed progressive hypoxic respiratory failure.  Although she had signed an [place] MOST form indicating that she was DNR and DNI, she was intubated for progressive respiratory failure.  Palliative care was reengaged to help with family conversations to communicate her poor prognosis given her multiple severe underlying medical issues and severe ARDS due to COVID-19 viral pneumonia, which portends a poor prognosis.  She was managed with ARDS and it ventilator strategy, analgesia, and empiric Covid treatment and antibiotics.  Her family ultimately  decided to withdraw aggressive care on 2023-05-10. Patient passed away at 17:58 on 09-May-2020.  Consults:  P[facility]M Palliative Care [facility]  Significant Diagnostic Studies: CXR-diffuse bilateral airspace disease 9/13 respiratory culture-normal flora 9/13 blood cultures to date  Treatments:  Mechanical ventilation Analgesia COVID-19 specific treatments-steroids, remdesivir.  Not a candidate for baricitinib due to renal dysfunction. Antibiotics    Disposition: funeral home of the family's choosing  Signed: Julian Hy 2020/05/09, 6:11 PM

## 2020-05-16 DEATH — deceased

## 2021-02-15 IMAGING — CT CT ABD-PELV W/ CM
2 of 5 series · 15 of 46 positions shown, 17 images · IV contrast (APPLIED)
Comparison: CT dated 04/18/2018

CLINICAL DATA: Bilateral lower extremity pain and shortness of
breath.

EXAM:
CT ANGIOGRAPHY CHEST
CT ABDOMEN AND PELVIS WITH CONTRAST
TECHNIQUE: Multidetector CT imaging of the chest was performed using the
standard protocol during bolus administration of intravenous
contrast. Multiplanar CT image reconstructions and MIPs were
obtained to evaluate the vascular anatomy. Multidetector CT imaging
of the abdomen and pelvis was performed using the standard protocol
during bolus administration of intravenous contrast.
CONTRAST:  100mL OMNIPAQUE IOHEXOL 350 MG/ML SOLN

[Series 3: axial st · axial · 0.92mm/px · z∈[-1360,-900]mm · 12 of 104 slices shown, 14 images]
[im 6/104  soft-tissue]
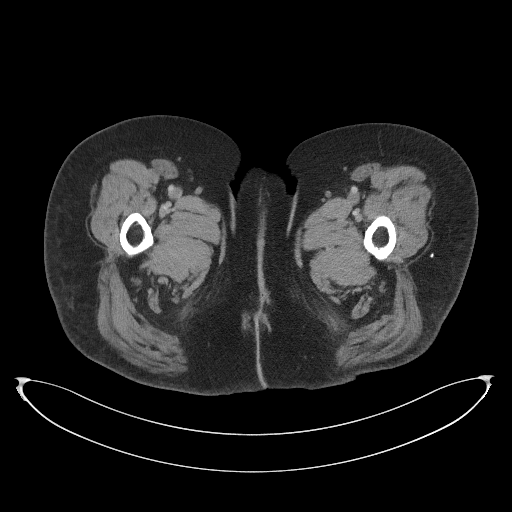
[im 6/104  bone]
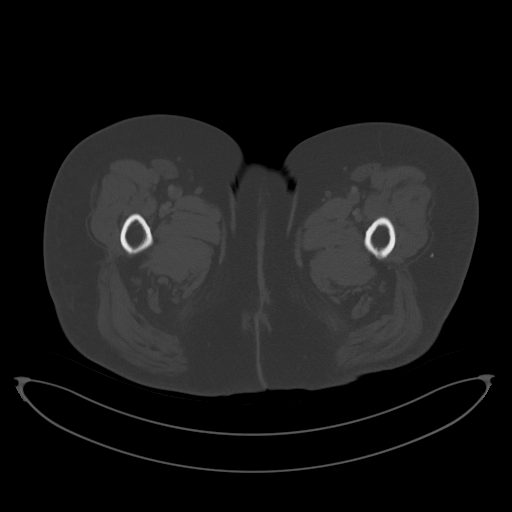
[im 18/104  soft-tissue]
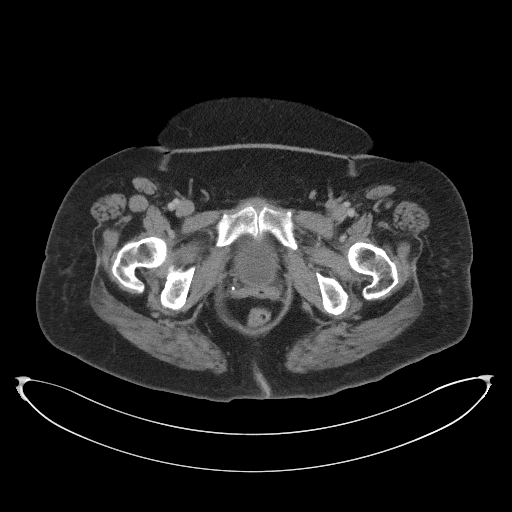
[im 23/104  soft-tissue]
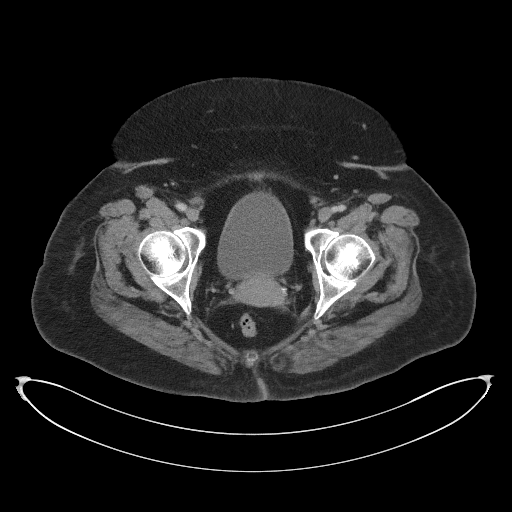
[im 29/104  soft-tissue]
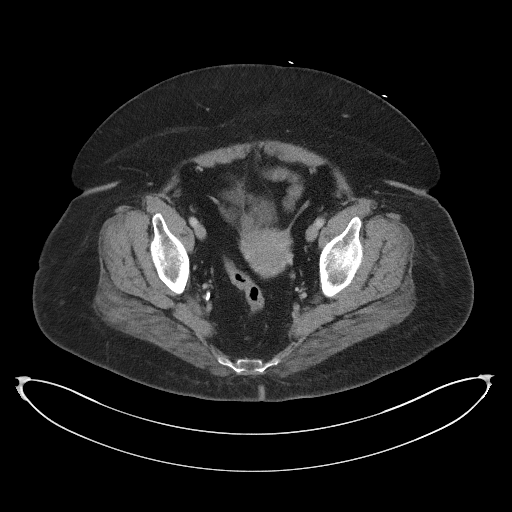
[im 41/104  soft-tissue]
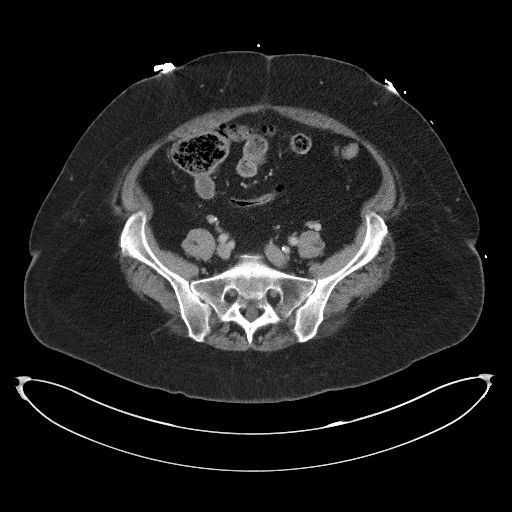
[im 46/104  soft-tissue]
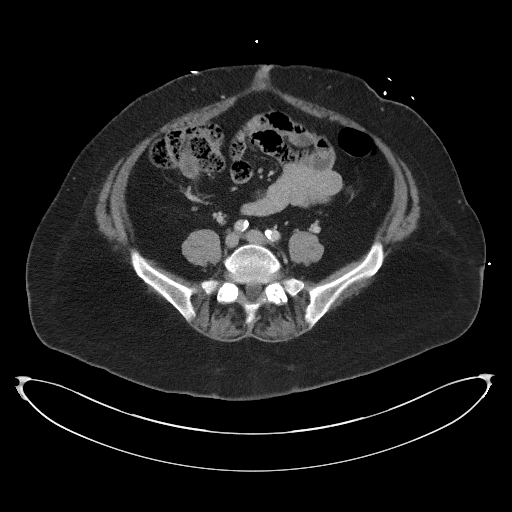
[im 58/104  soft-tissue]
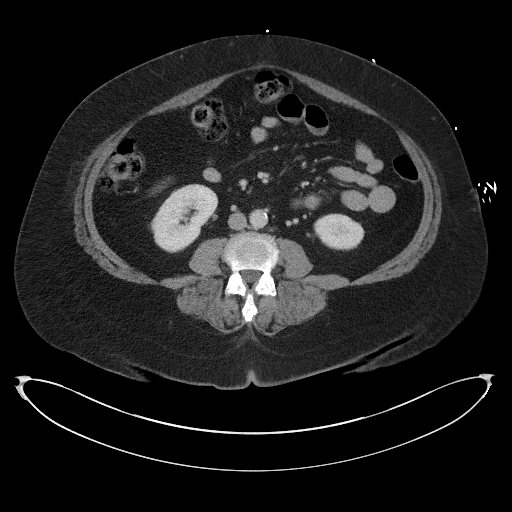
[im 63/104  soft-tissue]
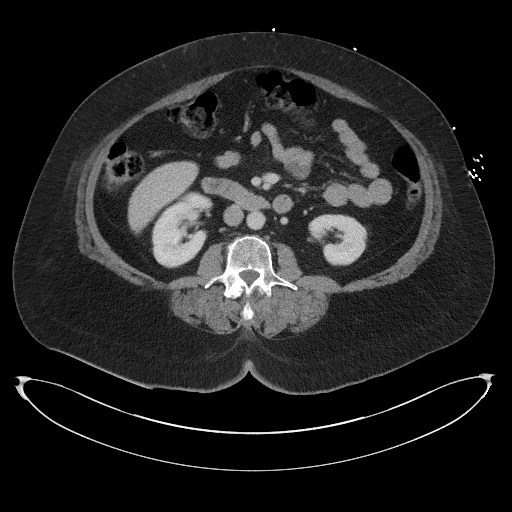
[im 75/104  soft-tissue]
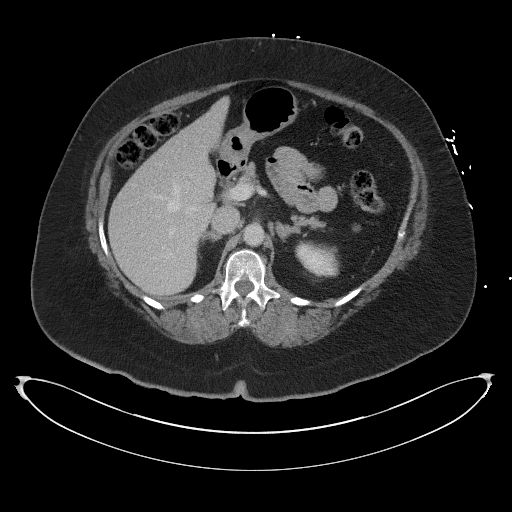
[im 75/104  bone]
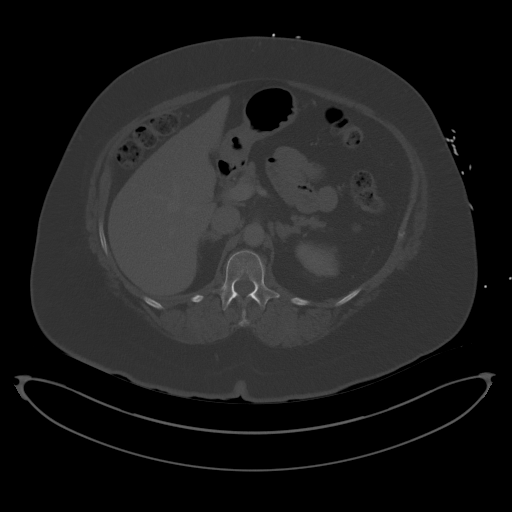
[im 81/104  soft-tissue]
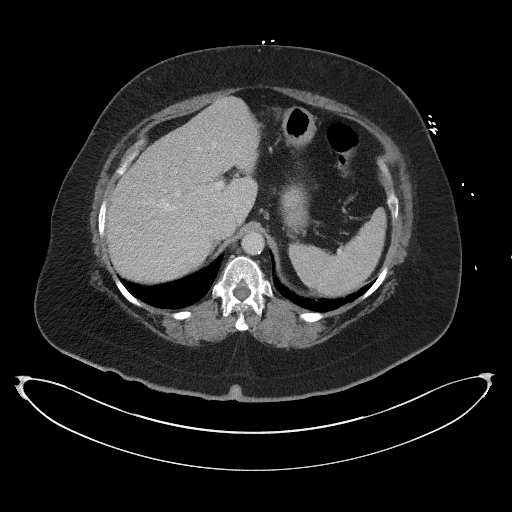
[im 86/104  soft-tissue]
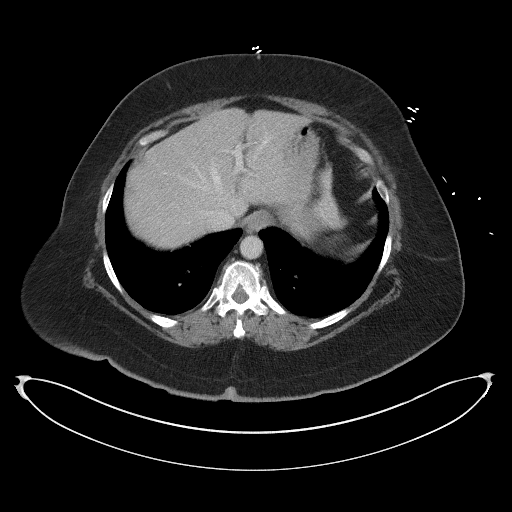
[im 98/104  soft-tissue]
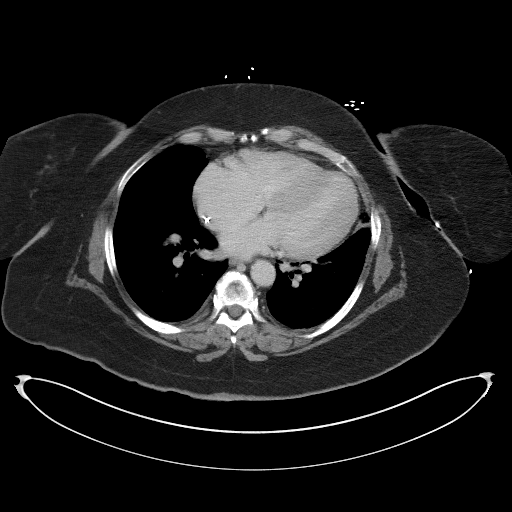

[Series 6: coronal st · coronal · 0.84mm/px · 3 of 106 slices shown]
[im 36/106  soft-tissue]
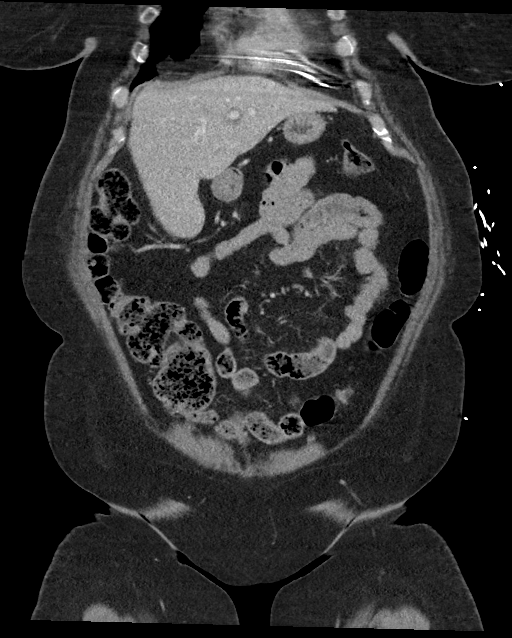
[im 47/106  soft-tissue]
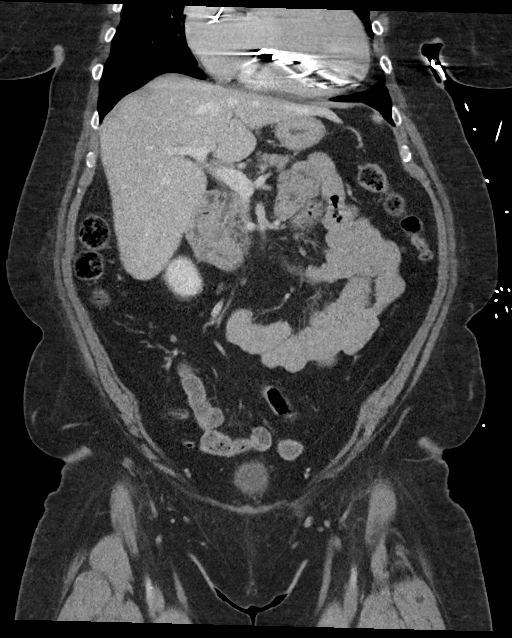
[im 59/106  soft-tissue]
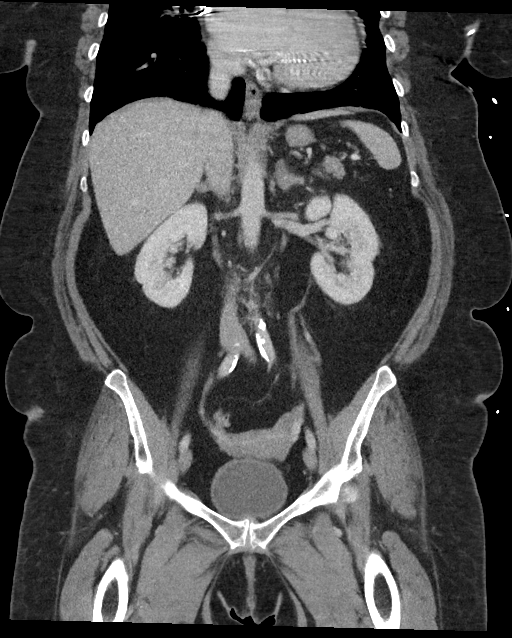

[15 of 46 positions shown; findings below may reference images not displayed]

FINDINGS: CTA CHEST FINDINGS

Cardiovascular: Contrast injection is sufficient to demonstrate
satisfactory opacification of the pulmonary arteries to the
segmental level. There is no pulmonary embolus. The main pulmonary
artery is dilated measuring approximately 3.8 cm in diameter. There
is no CT evidence of acute right heart strain. There are mild
atherosclerotic changes of the visualized thoracic aorta. Heart size
is enlarged. There is significant wall thickening of the left
ventricle. A dual chamber pacemaker is in place. There is no
significant pericardial effusion.

Mediastinum/Nodes:

--No mediastinal or hilar lymphadenopathy.

--No axillary lymphadenopathy.

--No supraclavicular lymphadenopathy.

--a large left-sided thyroid nodule is again noted.

--The esophagus is unremarkable

Lungs/Pleura: The lung volumes are low. The trachea is unremarkable.
There is a trace right-sided pleural effusion. There is no
pneumothorax or focal infiltrate. There is a small amount of
atelectasis in the lingula.

Musculoskeletal: No chest wall abnormality. No acute or significant
osseous findings.

Review of the MIP images confirms the above findings.

CT ABDOMEN and PELVIS FINDINGS

Hepatobiliary: The liver is normal. Status post
cholecystectomy.There is no biliary ductal dilation.

Pancreas: Normal contours without ductal dilatation. No
peripancreatic fluid collection.

Spleen: No splenic laceration or hematoma.

Adrenals/Urinary Tract:

--Adrenal glands: No adrenal hemorrhage.

--Right kidney/ureter: No hydronephrosis or perinephric hematoma.

--Left kidney/ureter: No hydronephrosis or perinephric hematoma.

--Urinary bladder: Unremarkable.

Stomach/Bowel:

--Stomach/Duodenum: No hiatal hernia or other gastric abnormality.
Normal duodenal course and caliber.

--Small bowel: No dilatation or inflammation.

--Colon: No focal abnormality.

--Appendix: Normal.

Vascular/Lymphatic: Atherosclerotic calcification is present within
the non-aneurysmal abdominal aorta, without hemodynamically
significant stenosis.

--No retroperitoneal lymphadenopathy.

--No mesenteric lymphadenopathy.

--No pelvic or inguinal lymphadenopathy.

Reproductive: Unremarkable

Other: There is a partially visualized 1.7 x 1.2 cm subcutaneous
nodule in the left gluteal fold. The abdominal wall is normal.

Musculoskeletal. No acute displaced fractures.

Review of the MIP images confirms the above findings.
IMPRESSION: 1. No acute thoracic, abdominal or pelvic injury.
2. Cardiomegaly with significant wall thickening of the left
ventricle.
3. Trace right-sided pleural effusion.
4. Dilated main pulmonary artery which can be seen in patients with
elevated pulmonary artery pressures.
5. Partially visualized 1.7 x 1.2 cm subcutaneous nodule in the left
gluteal fold. Recommend correlation with physical examination. This
could represent a sebaceous cyst.

Aortic Atherosclerosis ([id number]).
